# Patient Record
Sex: Female | Born: 1935 | Race: White | Hispanic: No | Marital: Married | Smoking: Former smoker
Health system: Southern US, Community
[De-identification: ages and names within clinical notes are randomized; demographics above are authoritative.]

## PROBLEM LIST (undated history)

## (undated) DIAGNOSIS — F419 Anxiety disorder, unspecified: Secondary | ICD-10-CM

## (undated) DIAGNOSIS — R55 Syncope and collapse: Secondary | ICD-10-CM

## (undated) DIAGNOSIS — Z9289 Personal history of other medical treatment: Secondary | ICD-10-CM

## (undated) DIAGNOSIS — I499 Cardiac arrhythmia, unspecified: Secondary | ICD-10-CM

## (undated) DIAGNOSIS — S52532A Colles' fracture of left radius, initial encounter for closed fracture: Secondary | ICD-10-CM

## (undated) DIAGNOSIS — I10 Essential (primary) hypertension: Secondary | ICD-10-CM

## (undated) DIAGNOSIS — K5790 Diverticulosis of intestine, part unspecified, without perforation or abscess without bleeding: Secondary | ICD-10-CM

## (undated) DIAGNOSIS — I2510 Atherosclerotic heart disease of native coronary artery without angina pectoris: Secondary | ICD-10-CM

## (undated) DIAGNOSIS — I5189 Other ill-defined heart diseases: Secondary | ICD-10-CM

## (undated) DIAGNOSIS — G4700 Insomnia, unspecified: Secondary | ICD-10-CM

## (undated) DIAGNOSIS — K449 Diaphragmatic hernia without obstruction or gangrene: Secondary | ICD-10-CM

## (undated) DIAGNOSIS — Z860100 Personal history of colon polyps, unspecified: Secondary | ICD-10-CM

## (undated) DIAGNOSIS — M545 Pain in thoracic spine: Secondary | ICD-10-CM

## (undated) DIAGNOSIS — K589 Irritable bowel syndrome without diarrhea: Secondary | ICD-10-CM

## (undated) DIAGNOSIS — L299 Pruritus, unspecified: Secondary | ICD-10-CM

## (undated) DIAGNOSIS — R911 Solitary pulmonary nodule: Secondary | ICD-10-CM

## (undated) DIAGNOSIS — J449 Chronic obstructive pulmonary disease, unspecified: Secondary | ICD-10-CM

## (undated) DIAGNOSIS — J90 Pleural effusion, not elsewhere classified: Secondary | ICD-10-CM

## (undated) DIAGNOSIS — I4891 Unspecified atrial fibrillation: Secondary | ICD-10-CM

## (undated) DIAGNOSIS — M546 Pain in thoracic spine: Secondary | ICD-10-CM

## (undated) DIAGNOSIS — E785 Hyperlipidemia, unspecified: Secondary | ICD-10-CM

## (undated) DIAGNOSIS — Z8701 Personal history of pneumonia (recurrent): Secondary | ICD-10-CM

## (undated) DIAGNOSIS — S2249XA Multiple fractures of ribs, unspecified side, initial encounter for closed fracture: Secondary | ICD-10-CM

## (undated) DIAGNOSIS — K222 Esophageal obstruction: Secondary | ICD-10-CM

## (undated) HISTORY — DX: Pain in thoracic spine: M54.50

## (undated) HISTORY — DX: Colles' fracture of left radius, initial encounter for closed fracture: S52.532A

## (undated) HISTORY — PX: APPENDECTOMY: SHX54

## (undated) HISTORY — DX: Irritable bowel syndrome, unspecified: K58.9

## (undated) HISTORY — DX: Solitary pulmonary nodule: R91.1

## (undated) HISTORY — DX: Anxiety disorder, unspecified: F41.9

## (undated) HISTORY — DX: Personal history of colonic polyps: Z86.010

## (undated) HISTORY — DX: Chronic obstructive pulmonary disease, unspecified: J44.9

## (undated) HISTORY — PX: POLYPECTOMY: SHX149

## (undated) HISTORY — DX: Pruritus, unspecified: L29.9

## (undated) HISTORY — DX: Personal history of pneumonia (recurrent): Z87.01

## (undated) HISTORY — DX: Pain in thoracic spine: M54.6

## (undated) HISTORY — DX: Esophageal obstruction: K22.2

## (undated) HISTORY — DX: Pleural effusion, not elsewhere classified: J90

## (undated) HISTORY — DX: Multiple fractures of ribs, unspecified side, initial encounter for closed fracture: S22.49XA

## (undated) HISTORY — DX: Essential (primary) hypertension: I10

## (undated) HISTORY — DX: Personal history of colon polyps, unspecified: Z86.0100

## (undated) HISTORY — PX: TONSILLECTOMY: SUR1361

## (undated) HISTORY — DX: Personal history of other medical treatment: Z92.89

## (undated) HISTORY — DX: Low back pain: M54.5

## (undated) HISTORY — DX: Insomnia, unspecified: G47.00

## (undated) HISTORY — DX: Other ill-defined heart diseases: I51.89

## (undated) HISTORY — PX: CHOLECYSTECTOMY: SHX55

## (undated) HISTORY — DX: Diaphragmatic hernia without obstruction or gangrene: K44.9

## (undated) HISTORY — DX: Diverticulosis of intestine, part unspecified, without perforation or abscess without bleeding: K57.90

## (undated) HISTORY — DX: Hyperlipidemia, unspecified: E78.5

---

## 1998-05-27 ENCOUNTER — Emergency Department (HOSPITAL_COMMUNITY): Admission: EM | Admit: 1998-05-27 | Discharge: 1998-05-27 | Payer: Self-pay | Admitting: Emergency Medicine

## 1998-12-30 ENCOUNTER — Other Ambulatory Visit: Admission: RE | Admit: 1998-12-30 | Discharge: 1998-12-30 | Payer: Self-pay | Admitting: Obstetrics and Gynecology

## 1999-08-25 ENCOUNTER — Other Ambulatory Visit: Admission: RE | Admit: 1999-08-25 | Discharge: 1999-08-25 | Payer: Self-pay | Admitting: Obstetrics and Gynecology

## 1999-12-01 ENCOUNTER — Ambulatory Visit (HOSPITAL_COMMUNITY): Admission: RE | Admit: 1999-12-01 | Discharge: 1999-12-01 | Payer: Self-pay | Admitting: Gastroenterology

## 2000-10-17 ENCOUNTER — Other Ambulatory Visit: Admission: RE | Admit: 2000-10-17 | Discharge: 2000-10-17 | Payer: Self-pay | Admitting: Obstetrics and Gynecology

## 2000-11-01 ENCOUNTER — Ambulatory Visit (HOSPITAL_COMMUNITY): Admission: RE | Admit: 2000-11-01 | Discharge: 2000-11-01 | Payer: Self-pay | Admitting: Internal Medicine

## 2000-11-01 ENCOUNTER — Encounter: Payer: Self-pay | Admitting: Internal Medicine

## 2000-12-20 ENCOUNTER — Inpatient Hospital Stay (HOSPITAL_COMMUNITY): Admission: EM | Admit: 2000-12-20 | Discharge: 2000-12-26 | Payer: Self-pay | Admitting: Emergency Medicine

## 2000-12-20 ENCOUNTER — Encounter: Payer: Self-pay | Admitting: Emergency Medicine

## 2000-12-21 ENCOUNTER — Encounter: Payer: Self-pay | Admitting: Internal Medicine

## 2000-12-22 ENCOUNTER — Encounter: Payer: Self-pay | Admitting: Internal Medicine

## 2000-12-23 ENCOUNTER — Encounter: Payer: Self-pay | Admitting: Internal Medicine

## 2000-12-24 ENCOUNTER — Encounter: Payer: Self-pay | Admitting: Internal Medicine

## 2000-12-25 ENCOUNTER — Encounter: Payer: Self-pay | Admitting: Internal Medicine

## 2000-12-26 ENCOUNTER — Encounter: Payer: Self-pay | Admitting: Internal Medicine

## 2001-01-03 ENCOUNTER — Encounter: Payer: Self-pay | Admitting: Internal Medicine

## 2001-01-03 ENCOUNTER — Inpatient Hospital Stay (HOSPITAL_COMMUNITY): Admission: EM | Admit: 2001-01-03 | Discharge: 2001-01-04 | Payer: Self-pay | Admitting: Internal Medicine

## 2001-01-03 ENCOUNTER — Encounter (INDEPENDENT_AMBULATORY_CARE_PROVIDER_SITE_OTHER): Payer: Self-pay | Admitting: *Deleted

## 2001-02-06 ENCOUNTER — Ambulatory Visit (HOSPITAL_COMMUNITY): Admission: RE | Admit: 2001-02-06 | Discharge: 2001-02-06 | Payer: Self-pay | Admitting: Internal Medicine

## 2001-02-06 ENCOUNTER — Encounter: Payer: Self-pay | Admitting: Internal Medicine

## 2001-11-02 ENCOUNTER — Other Ambulatory Visit: Admission: RE | Admit: 2001-11-02 | Discharge: 2001-11-02 | Payer: Self-pay | Admitting: Obstetrics and Gynecology

## 2001-11-07 ENCOUNTER — Ambulatory Visit (HOSPITAL_COMMUNITY): Admission: RE | Admit: 2001-11-07 | Discharge: 2001-11-07 | Payer: Self-pay | Admitting: Internal Medicine

## 2001-11-07 ENCOUNTER — Encounter: Payer: Self-pay | Admitting: Internal Medicine

## 2001-11-08 ENCOUNTER — Ambulatory Visit (HOSPITAL_COMMUNITY): Admission: RE | Admit: 2001-11-08 | Discharge: 2001-11-08 | Payer: Self-pay | Admitting: Specialist

## 2001-11-08 ENCOUNTER — Encounter: Payer: Self-pay | Admitting: Specialist

## 2002-01-28 ENCOUNTER — Encounter: Admission: RE | Admit: 2002-01-28 | Discharge: 2002-01-28 | Payer: Self-pay | Admitting: Internal Medicine

## 2002-01-28 ENCOUNTER — Encounter: Payer: Self-pay | Admitting: Internal Medicine

## 2002-11-13 ENCOUNTER — Ambulatory Visit (HOSPITAL_COMMUNITY): Admission: RE | Admit: 2002-11-13 | Discharge: 2002-11-13 | Payer: Self-pay | Admitting: Internal Medicine

## 2002-11-13 ENCOUNTER — Encounter: Payer: Self-pay | Admitting: Internal Medicine

## 2002-12-19 ENCOUNTER — Other Ambulatory Visit: Admission: RE | Admit: 2002-12-19 | Discharge: 2002-12-19 | Payer: Self-pay | Admitting: Obstetrics and Gynecology

## 2003-09-20 HISTORY — PX: BLADDER SUSPENSION: SHX72

## 2003-11-18 ENCOUNTER — Ambulatory Visit (HOSPITAL_COMMUNITY): Admission: RE | Admit: 2003-11-18 | Discharge: 2003-11-18 | Payer: Self-pay | Admitting: Internal Medicine

## 2003-12-02 ENCOUNTER — Observation Stay (HOSPITAL_COMMUNITY): Admission: RE | Admit: 2003-12-02 | Discharge: 2003-12-03 | Payer: Self-pay | Admitting: Urology

## 2003-12-31 ENCOUNTER — Encounter: Admission: RE | Admit: 2003-12-31 | Discharge: 2003-12-31 | Payer: Self-pay | Admitting: Orthopedic Surgery

## 2004-02-09 ENCOUNTER — Ambulatory Visit (HOSPITAL_COMMUNITY): Admission: RE | Admit: 2004-02-09 | Discharge: 2004-02-09 | Payer: Self-pay | Admitting: Internal Medicine

## 2004-03-09 ENCOUNTER — Ambulatory Visit (HOSPITAL_COMMUNITY): Admission: AD | Admit: 2004-03-09 | Discharge: 2004-03-09 | Payer: Self-pay | Admitting: Cardiology

## 2004-07-23 ENCOUNTER — Ambulatory Visit: Payer: Self-pay | Admitting: Internal Medicine

## 2004-09-19 HISTORY — PX: ROTATOR CUFF REPAIR: SHX139

## 2004-12-06 ENCOUNTER — Ambulatory Visit (HOSPITAL_COMMUNITY): Admission: RE | Admit: 2004-12-06 | Discharge: 2004-12-06 | Payer: Self-pay | Admitting: Internal Medicine

## 2004-12-08 ENCOUNTER — Ambulatory Visit: Payer: Self-pay | Admitting: Internal Medicine

## 2004-12-10 ENCOUNTER — Ambulatory Visit: Payer: Self-pay | Admitting: Internal Medicine

## 2004-12-21 ENCOUNTER — Ambulatory Visit: Payer: Self-pay | Admitting: Internal Medicine

## 2004-12-24 ENCOUNTER — Ambulatory Visit: Payer: Self-pay | Admitting: Internal Medicine

## 2005-07-15 ENCOUNTER — Ambulatory Visit: Payer: Self-pay | Admitting: Internal Medicine

## 2005-08-01 ENCOUNTER — Ambulatory Visit: Payer: Self-pay | Admitting: Internal Medicine

## 2005-08-09 ENCOUNTER — Ambulatory Visit: Payer: Self-pay | Admitting: Internal Medicine

## 2005-08-25 ENCOUNTER — Ambulatory Visit: Payer: Self-pay | Admitting: Cardiology

## 2005-09-30 ENCOUNTER — Other Ambulatory Visit: Admission: RE | Admit: 2005-09-30 | Discharge: 2005-09-30 | Payer: Self-pay | Admitting: Obstetrics and Gynecology

## 2005-11-02 ENCOUNTER — Ambulatory Visit: Payer: Self-pay | Admitting: Internal Medicine

## 2005-11-18 ENCOUNTER — Encounter: Admission: RE | Admit: 2005-11-18 | Discharge: 2005-11-18 | Payer: Self-pay | Admitting: Internal Medicine

## 2005-12-02 ENCOUNTER — Ambulatory Visit: Payer: Self-pay | Admitting: Internal Medicine

## 2005-12-05 ENCOUNTER — Ambulatory Visit: Payer: Self-pay | Admitting: Internal Medicine

## 2005-12-09 ENCOUNTER — Ambulatory Visit: Payer: Self-pay | Admitting: Internal Medicine

## 2005-12-12 ENCOUNTER — Ambulatory Visit: Payer: Self-pay | Admitting: Internal Medicine

## 2005-12-12 ENCOUNTER — Ambulatory Visit: Payer: Self-pay

## 2006-01-10 ENCOUNTER — Ambulatory Visit: Payer: Self-pay | Admitting: Internal Medicine

## 2006-02-08 ENCOUNTER — Ambulatory Visit: Payer: Self-pay | Admitting: Internal Medicine

## 2006-02-09 ENCOUNTER — Ambulatory Visit: Payer: Self-pay | Admitting: Internal Medicine

## 2006-02-09 LAB — CONVERTED CEMR LAB
Basophils Absolute: 1 10*3/uL — ABNORMAL HIGH (ref 0.0–0.1)
Eosinophils Absolute: 6.1 10*3/uL — ABNORMAL HIGH (ref 0.0–0.6)
HCT: 39.4 % (ref 36.0–46.0)
Hemoglobin: 13.2 g/dL (ref 12.0–15.0)
MCHC: 33.5 g/dL (ref 30.0–36.0)
MCV: 91.8 fL (ref 78.0–100.0)
Monocytes Absolute: 0.4 10*3/uL (ref 0.2–0.7)
Neutrophils Relative %: 50.9 % (ref 43.0–77.0)

## 2006-07-03 ENCOUNTER — Ambulatory Visit: Payer: Self-pay | Admitting: Internal Medicine

## 2006-07-12 ENCOUNTER — Ambulatory Visit (HOSPITAL_COMMUNITY): Admission: RE | Admit: 2006-07-12 | Discharge: 2006-07-13 | Payer: Self-pay | Admitting: Orthopedic Surgery

## 2006-09-14 ENCOUNTER — Ambulatory Visit: Payer: Self-pay | Admitting: Internal Medicine

## 2006-11-06 ENCOUNTER — Ambulatory Visit: Payer: Self-pay | Admitting: Internal Medicine

## 2006-11-06 LAB — CONVERTED CEMR LAB
BUN: 27 mg/dL — ABNORMAL HIGH (ref 6–23)
Basophils Relative: 1.2 % — ABNORMAL HIGH (ref 0.0–1.0)
Monocytes Relative: 4.4 % (ref 3.0–11.0)
Platelets: 282 10*3/uL (ref 150–400)
RBC / HPF: NONE SEEN
RDW: 13.8 % (ref 11.5–14.6)
Specific Gravity, Urine: >=1.03 (ref 1.000–1.03)
Urine Glucose: NEGATIVE mg/dL
Urobilinogen, UA: 0.2 (ref 0.0–1.0)
pH: 6 (ref 5.0–8.0)

## 2006-11-07 ENCOUNTER — Encounter: Payer: Self-pay | Admitting: Internal Medicine

## 2006-11-07 ENCOUNTER — Ambulatory Visit: Payer: Self-pay | Admitting: Cardiology

## 2006-11-10 ENCOUNTER — Ambulatory Visit: Payer: Self-pay | Admitting: Internal Medicine

## 2006-11-10 LAB — CONVERTED CEMR LAB
Bilirubin Urine: NEGATIVE
Ketones, ur: NEGATIVE mg/dL
Nitrite: NEGATIVE
Urine Glucose: NEGATIVE mg/dL

## 2006-11-24 ENCOUNTER — Ambulatory Visit: Payer: Self-pay | Admitting: Gastroenterology

## 2006-11-24 LAB — CONVERTED CEMR LAB
Basophils Absolute: 0 10*3/uL (ref 0.0–0.1)
Eosinophils Absolute: 0.3 10*3/uL (ref 0.0–0.6)
Eosinophils Relative: 4.6 % (ref 0.0–5.0)
Folate: 16 ng/mL
MCHC: 33.5 g/dL (ref 30.0–36.0)
MCV: 87.9 fL (ref 78.0–100.0)
Monocytes Relative: 6.5 % (ref 3.0–11.0)
Neutro Abs: 3.6 10*3/uL (ref 1.4–7.7)
Platelets: 279 10*3/uL (ref 150–400)
RBC: 4.98 M/uL (ref 3.87–5.11)
Sed Rate: 7 mm/hr (ref 0–25)
Vitamin B-12: 375 pg/mL (ref 211–911)
WBC: 6.5 10*3/uL (ref 4.5–10.5)

## 2006-11-27 ENCOUNTER — Ambulatory Visit: Payer: Self-pay | Admitting: Gastroenterology

## 2006-11-27 ENCOUNTER — Encounter (INDEPENDENT_AMBULATORY_CARE_PROVIDER_SITE_OTHER): Payer: Self-pay | Admitting: Specialist

## 2006-12-01 ENCOUNTER — Ambulatory Visit (HOSPITAL_COMMUNITY): Admission: RE | Admit: 2006-12-01 | Discharge: 2006-12-01 | Payer: Self-pay | Admitting: Internal Medicine

## 2006-12-06 ENCOUNTER — Inpatient Hospital Stay (HOSPITAL_COMMUNITY): Admission: EM | Admit: 2006-12-06 | Discharge: 2006-12-07 | Payer: Self-pay | Admitting: Emergency Medicine

## 2006-12-06 ENCOUNTER — Encounter: Payer: Self-pay | Admitting: Gastroenterology

## 2006-12-07 ENCOUNTER — Encounter (INDEPENDENT_AMBULATORY_CARE_PROVIDER_SITE_OTHER): Payer: Self-pay | Admitting: *Deleted

## 2006-12-12 ENCOUNTER — Ambulatory Visit: Payer: Self-pay | Admitting: Gastroenterology

## 2006-12-13 ENCOUNTER — Ambulatory Visit: Payer: Self-pay | Admitting: Internal Medicine

## 2006-12-21 ENCOUNTER — Emergency Department (HOSPITAL_COMMUNITY): Admission: EM | Admit: 2006-12-21 | Discharge: 2006-12-21 | Payer: Self-pay | Admitting: Emergency Medicine

## 2007-01-17 ENCOUNTER — Other Ambulatory Visit: Admission: RE | Admit: 2007-01-17 | Discharge: 2007-01-17 | Payer: Self-pay | Admitting: Obstetrics and Gynecology

## 2007-02-01 ENCOUNTER — Ambulatory Visit: Payer: Self-pay

## 2007-06-12 ENCOUNTER — Encounter: Payer: Self-pay | Admitting: *Deleted

## 2007-06-12 DIAGNOSIS — D126 Benign neoplasm of colon, unspecified: Secondary | ICD-10-CM

## 2007-06-12 DIAGNOSIS — I10 Essential (primary) hypertension: Secondary | ICD-10-CM | POA: Insufficient documentation

## 2007-06-12 DIAGNOSIS — Z9089 Acquired absence of other organs: Secondary | ICD-10-CM

## 2007-06-12 DIAGNOSIS — Z8701 Personal history of pneumonia (recurrent): Secondary | ICD-10-CM | POA: Insufficient documentation

## 2007-07-24 ENCOUNTER — Encounter: Payer: Self-pay | Admitting: Internal Medicine

## 2007-08-02 ENCOUNTER — Ambulatory Visit: Payer: Self-pay | Admitting: Internal Medicine

## 2007-08-02 DIAGNOSIS — J309 Allergic rhinitis, unspecified: Secondary | ICD-10-CM

## 2007-08-02 DIAGNOSIS — K589 Irritable bowel syndrome without diarrhea: Secondary | ICD-10-CM | POA: Insufficient documentation

## 2007-08-02 DIAGNOSIS — G4700 Insomnia, unspecified: Secondary | ICD-10-CM

## 2007-09-08 ENCOUNTER — Telehealth: Payer: Self-pay | Admitting: Internal Medicine

## 2007-09-10 ENCOUNTER — Telehealth: Payer: Self-pay | Admitting: Internal Medicine

## 2007-09-14 ENCOUNTER — Ambulatory Visit: Payer: Self-pay | Admitting: Internal Medicine

## 2007-09-14 DIAGNOSIS — J4520 Mild intermittent asthma, uncomplicated: Secondary | ICD-10-CM

## 2007-09-17 ENCOUNTER — Telehealth (INDEPENDENT_AMBULATORY_CARE_PROVIDER_SITE_OTHER): Payer: Self-pay | Admitting: *Deleted

## 2007-09-25 ENCOUNTER — Ambulatory Visit: Payer: Self-pay | Admitting: Internal Medicine

## 2007-09-27 ENCOUNTER — Encounter: Payer: Self-pay | Admitting: Internal Medicine

## 2007-10-01 ENCOUNTER — Ambulatory Visit: Payer: Self-pay | Admitting: Internal Medicine

## 2007-10-01 ENCOUNTER — Telehealth: Payer: Self-pay | Admitting: Internal Medicine

## 2007-10-01 DIAGNOSIS — J984 Other disorders of lung: Secondary | ICD-10-CM

## 2007-10-05 ENCOUNTER — Ambulatory Visit: Payer: Self-pay | Admitting: Cardiology

## 2007-10-08 ENCOUNTER — Ambulatory Visit: Payer: Self-pay | Admitting: Internal Medicine

## 2007-10-31 ENCOUNTER — Ambulatory Visit: Payer: Self-pay | Admitting: Internal Medicine

## 2007-10-31 DIAGNOSIS — R0609 Other forms of dyspnea: Secondary | ICD-10-CM

## 2007-10-31 DIAGNOSIS — R0989 Other specified symptoms and signs involving the circulatory and respiratory systems: Secondary | ICD-10-CM

## 2007-11-15 ENCOUNTER — Encounter: Payer: Self-pay | Admitting: Internal Medicine

## 2007-11-21 ENCOUNTER — Encounter (INDEPENDENT_AMBULATORY_CARE_PROVIDER_SITE_OTHER): Payer: Self-pay | Admitting: *Deleted

## 2007-11-21 ENCOUNTER — Ambulatory Visit: Payer: Self-pay | Admitting: Internal Medicine

## 2007-11-21 DIAGNOSIS — I519 Heart disease, unspecified: Secondary | ICD-10-CM | POA: Insufficient documentation

## 2007-12-05 ENCOUNTER — Encounter: Payer: Self-pay | Admitting: Internal Medicine

## 2007-12-05 ENCOUNTER — Ambulatory Visit: Payer: Self-pay | Admitting: Internal Medicine

## 2007-12-10 ENCOUNTER — Ambulatory Visit (HOSPITAL_COMMUNITY): Admission: RE | Admit: 2007-12-10 | Discharge: 2007-12-10 | Payer: Self-pay | Admitting: Internal Medicine

## 2007-12-11 ENCOUNTER — Encounter: Payer: Self-pay | Admitting: Internal Medicine

## 2007-12-14 ENCOUNTER — Telehealth: Payer: Self-pay | Admitting: Internal Medicine

## 2007-12-19 ENCOUNTER — Ambulatory Visit: Payer: Self-pay | Admitting: Internal Medicine

## 2007-12-19 ENCOUNTER — Ambulatory Visit (HOSPITAL_COMMUNITY): Admission: RE | Admit: 2007-12-19 | Discharge: 2007-12-19 | Payer: Self-pay | Admitting: Internal Medicine

## 2007-12-24 ENCOUNTER — Telehealth: Payer: Self-pay | Admitting: Internal Medicine

## 2007-12-25 ENCOUNTER — Encounter: Payer: Self-pay | Admitting: Internal Medicine

## 2007-12-26 ENCOUNTER — Ambulatory Visit (HOSPITAL_COMMUNITY): Admission: RE | Admit: 2007-12-26 | Discharge: 2007-12-26 | Payer: Self-pay | Admitting: Internal Medicine

## 2007-12-31 ENCOUNTER — Ambulatory Visit: Payer: Self-pay | Admitting: Internal Medicine

## 2008-01-02 ENCOUNTER — Telehealth: Payer: Self-pay | Admitting: Internal Medicine

## 2008-01-16 ENCOUNTER — Ambulatory Visit: Payer: Self-pay

## 2008-01-16 ENCOUNTER — Encounter: Payer: Self-pay | Admitting: Internal Medicine

## 2008-01-22 ENCOUNTER — Encounter: Payer: Self-pay | Admitting: Internal Medicine

## 2008-01-23 ENCOUNTER — Telehealth: Payer: Self-pay | Admitting: Internal Medicine

## 2008-01-24 ENCOUNTER — Encounter: Admission: RE | Admit: 2008-01-24 | Discharge: 2008-01-24 | Payer: Self-pay | Admitting: Internal Medicine

## 2008-01-24 ENCOUNTER — Ambulatory Visit: Payer: Self-pay | Admitting: Internal Medicine

## 2008-01-24 LAB — CONVERTED CEMR LAB
Basophils Absolute: 0.1 10*3/uL (ref 0.0–0.1)
Basophils Relative: 0.9 % (ref 0.0–1.0)
CO2: 28 meq/L (ref 19–32)
Chloride: 103 meq/L (ref 96–112)
Eosinophils Absolute: 0.3 10*3/uL (ref 0.0–0.7)
GFR calc non Af Amer: 86 mL/min
Lymphocytes Relative: 33.9 % (ref 12.0–46.0)
MCHC: 33.5 g/dL (ref 30.0–36.0)
MCV: 88.6 fL (ref 78.0–100.0)
Neutrophils Relative %: 54.6 % (ref 43.0–77.0)
Platelets: 256 10*3/uL (ref 150–400)
Potassium: 4.1 meq/L (ref 3.5–5.1)
RBC: 4.79 M/uL (ref 3.87–5.11)
WBC: 7.1 10*3/uL (ref 4.5–10.5)

## 2008-01-25 ENCOUNTER — Ambulatory Visit: Payer: Self-pay | Admitting: Internal Medicine

## 2008-01-30 ENCOUNTER — Ambulatory Visit: Payer: Self-pay | Admitting: Internal Medicine

## 2008-01-31 ENCOUNTER — Telehealth: Payer: Self-pay | Admitting: Internal Medicine

## 2008-04-15 ENCOUNTER — Telehealth: Payer: Self-pay | Admitting: Internal Medicine

## 2008-05-19 ENCOUNTER — Telehealth: Payer: Self-pay | Admitting: Internal Medicine

## 2008-05-28 ENCOUNTER — Ambulatory Visit: Payer: Self-pay | Admitting: Internal Medicine

## 2008-05-28 DIAGNOSIS — F418 Other specified anxiety disorders: Secondary | ICD-10-CM

## 2008-06-11 ENCOUNTER — Telehealth: Payer: Self-pay | Admitting: Internal Medicine

## 2008-06-11 ENCOUNTER — Ambulatory Visit: Payer: Self-pay | Admitting: Internal Medicine

## 2008-06-23 ENCOUNTER — Telehealth: Payer: Self-pay | Admitting: Internal Medicine

## 2008-06-24 ENCOUNTER — Ambulatory Visit: Payer: Self-pay | Admitting: Internal Medicine

## 2008-06-26 ENCOUNTER — Telehealth: Payer: Self-pay | Admitting: Internal Medicine

## 2008-07-18 ENCOUNTER — Telehealth: Payer: Self-pay | Admitting: Internal Medicine

## 2008-07-21 ENCOUNTER — Ambulatory Visit: Payer: Self-pay | Admitting: Internal Medicine

## 2008-09-16 ENCOUNTER — Ambulatory Visit: Payer: Self-pay | Admitting: Pulmonary Disease

## 2008-09-16 ENCOUNTER — Telehealth: Payer: Self-pay | Admitting: Internal Medicine

## 2008-09-19 HISTORY — PX: ORIF WRIST FRACTURE: SHX2133

## 2008-09-30 ENCOUNTER — Encounter: Payer: Self-pay | Admitting: Internal Medicine

## 2008-10-06 ENCOUNTER — Telehealth: Payer: Self-pay | Admitting: Internal Medicine

## 2008-10-15 ENCOUNTER — Ambulatory Visit: Payer: Self-pay | Admitting: Cardiology

## 2008-10-20 DIAGNOSIS — S2249XA Multiple fractures of ribs, unspecified side, initial encounter for closed fracture: Secondary | ICD-10-CM

## 2008-10-20 HISTORY — DX: Multiple fractures of ribs, unspecified side, initial encounter for closed fracture: S22.49XA

## 2008-10-21 ENCOUNTER — Ambulatory Visit: Payer: Self-pay | Admitting: Internal Medicine

## 2008-10-21 DIAGNOSIS — J449 Chronic obstructive pulmonary disease, unspecified: Secondary | ICD-10-CM

## 2008-10-22 ENCOUNTER — Telehealth (INDEPENDENT_AMBULATORY_CARE_PROVIDER_SITE_OTHER): Payer: Self-pay | Admitting: *Deleted

## 2008-11-03 ENCOUNTER — Telehealth: Payer: Self-pay | Admitting: Internal Medicine

## 2008-11-10 ENCOUNTER — Encounter: Payer: Self-pay | Admitting: Internal Medicine

## 2008-11-11 ENCOUNTER — Encounter: Payer: Self-pay | Admitting: Internal Medicine

## 2008-11-13 ENCOUNTER — Encounter: Payer: Self-pay | Admitting: Internal Medicine

## 2008-11-13 DIAGNOSIS — S52539A Colles' fracture of unspecified radius, initial encounter for closed fracture: Secondary | ICD-10-CM | POA: Insufficient documentation

## 2008-11-14 ENCOUNTER — Encounter: Payer: Self-pay | Admitting: Internal Medicine

## 2008-11-15 ENCOUNTER — Telehealth: Payer: Self-pay | Admitting: Family Medicine

## 2008-11-19 ENCOUNTER — Telehealth: Payer: Self-pay | Admitting: Internal Medicine

## 2008-11-19 ENCOUNTER — Ambulatory Visit: Payer: Self-pay | Admitting: Internal Medicine

## 2008-11-20 ENCOUNTER — Ambulatory Visit: Payer: Self-pay | Admitting: Internal Medicine

## 2008-11-20 ENCOUNTER — Encounter: Admission: RE | Admit: 2008-11-20 | Discharge: 2008-11-20 | Payer: Self-pay | Admitting: Internal Medicine

## 2008-11-24 ENCOUNTER — Telehealth: Payer: Self-pay | Admitting: Internal Medicine

## 2008-11-26 ENCOUNTER — Telehealth: Payer: Self-pay | Admitting: Internal Medicine

## 2008-12-08 ENCOUNTER — Telehealth: Payer: Self-pay | Admitting: Internal Medicine

## 2008-12-09 ENCOUNTER — Encounter: Payer: Self-pay | Admitting: Internal Medicine

## 2008-12-10 ENCOUNTER — Telehealth: Payer: Self-pay | Admitting: Internal Medicine

## 2008-12-10 ENCOUNTER — Ambulatory Visit: Payer: Self-pay | Admitting: Internal Medicine

## 2008-12-17 ENCOUNTER — Telehealth: Payer: Self-pay | Admitting: Internal Medicine

## 2008-12-23 ENCOUNTER — Telehealth: Payer: Self-pay | Admitting: Internal Medicine

## 2008-12-24 ENCOUNTER — Encounter: Admission: RE | Admit: 2008-12-24 | Discharge: 2008-12-24 | Payer: Self-pay | Admitting: Internal Medicine

## 2008-12-24 ENCOUNTER — Ambulatory Visit: Payer: Self-pay | Admitting: Internal Medicine

## 2008-12-24 LAB — CONVERTED CEMR LAB
BUN: 17 mg/dL (ref 6–23)
Basophils Relative: 0.3 % (ref 0.0–3.0)
CO2: 28 meq/L (ref 19–32)
Calcium: 9.2 mg/dL (ref 8.4–10.5)
Eosinophils Relative: 2.4 % (ref 0.0–5.0)
GFR calc non Af Amer: 125.93 mL/min (ref >60)
Glucose, Bld: 94 mg/dL (ref 70–99)
HCT: 38.9 % (ref 36.0–46.0)
Hemoglobin: 13.3 g/dL (ref 12.0–15.0)
Lymphs Abs: 2 10*3/uL (ref 0.7–4.0)
MCV: 89.7 fL (ref 78.0–100.0)
Monocytes Absolute: 0.2 10*3/uL (ref 0.1–1.0)
Monocytes Relative: 3.1 % (ref 3.0–12.0)
Neutro Abs: 5.1 10*3/uL (ref 1.4–7.7)
RBC: 4.34 M/uL (ref 3.87–5.11)
Sodium: 143 meq/L (ref 135–145)
WBC: 7.5 10*3/uL (ref 4.5–10.5)

## 2009-01-01 ENCOUNTER — Telehealth: Payer: Self-pay | Admitting: Internal Medicine

## 2009-01-06 ENCOUNTER — Ambulatory Visit: Payer: Self-pay | Admitting: Internal Medicine

## 2009-01-06 ENCOUNTER — Telehealth: Payer: Self-pay | Admitting: Internal Medicine

## 2009-01-06 DIAGNOSIS — S62109A Fracture of unspecified carpal bone, unspecified wrist, initial encounter for closed fracture: Secondary | ICD-10-CM | POA: Insufficient documentation

## 2009-01-26 ENCOUNTER — Telehealth (INDEPENDENT_AMBULATORY_CARE_PROVIDER_SITE_OTHER): Payer: Self-pay | Admitting: *Deleted

## 2009-01-28 ENCOUNTER — Ambulatory Visit: Payer: Self-pay | Admitting: Internal Medicine

## 2009-03-18 ENCOUNTER — Telehealth (INDEPENDENT_AMBULATORY_CARE_PROVIDER_SITE_OTHER): Payer: Self-pay | Admitting: *Deleted

## 2009-03-30 ENCOUNTER — Ambulatory Visit: Payer: Self-pay | Admitting: Internal Medicine

## 2009-03-31 ENCOUNTER — Telehealth: Payer: Self-pay | Admitting: Internal Medicine

## 2009-04-10 ENCOUNTER — Encounter: Payer: Self-pay | Admitting: Internal Medicine

## 2009-04-25 ENCOUNTER — Encounter: Payer: Self-pay | Admitting: Internal Medicine

## 2009-04-27 ENCOUNTER — Encounter: Payer: Self-pay | Admitting: Internal Medicine

## 2009-05-01 ENCOUNTER — Encounter: Payer: Self-pay | Admitting: Internal Medicine

## 2009-06-23 ENCOUNTER — Ambulatory Visit: Payer: Self-pay | Admitting: Internal Medicine

## 2009-06-24 DIAGNOSIS — H9190 Unspecified hearing loss, unspecified ear: Secondary | ICD-10-CM | POA: Insufficient documentation

## 2009-08-03 ENCOUNTER — Telehealth: Payer: Self-pay | Admitting: Gastroenterology

## 2009-08-10 ENCOUNTER — Ambulatory Visit: Payer: Self-pay | Admitting: Internal Medicine

## 2009-08-10 DIAGNOSIS — M810 Age-related osteoporosis without current pathological fracture: Secondary | ICD-10-CM

## 2009-08-10 DIAGNOSIS — R159 Full incontinence of feces: Secondary | ICD-10-CM | POA: Insufficient documentation

## 2009-08-10 DIAGNOSIS — Z860100 Personal history of colon polyps, unspecified: Secondary | ICD-10-CM | POA: Insufficient documentation

## 2009-08-10 DIAGNOSIS — K449 Diaphragmatic hernia without obstruction or gangrene: Secondary | ICD-10-CM | POA: Insufficient documentation

## 2009-08-10 DIAGNOSIS — Z8719 Personal history of other diseases of the digestive system: Secondary | ICD-10-CM | POA: Insufficient documentation

## 2009-08-25 ENCOUNTER — Telehealth: Payer: Self-pay | Admitting: Internal Medicine

## 2009-10-20 ENCOUNTER — Ambulatory Visit: Payer: Self-pay | Admitting: Internal Medicine

## 2009-11-10 ENCOUNTER — Encounter (INDEPENDENT_AMBULATORY_CARE_PROVIDER_SITE_OTHER): Payer: Self-pay | Admitting: *Deleted

## 2009-11-11 ENCOUNTER — Ambulatory Visit: Payer: Self-pay | Admitting: Internal Medicine

## 2009-11-30 ENCOUNTER — Ambulatory Visit (HOSPITAL_COMMUNITY): Admission: RE | Admit: 2009-11-30 | Discharge: 2009-11-30 | Payer: Self-pay | Admitting: Internal Medicine

## 2009-11-30 LAB — HM MAMMOGRAPHY: HM Mammogram: NEGATIVE

## 2009-12-09 ENCOUNTER — Encounter: Payer: Self-pay | Admitting: Internal Medicine

## 2009-12-09 DIAGNOSIS — I6529 Occlusion and stenosis of unspecified carotid artery: Secondary | ICD-10-CM | POA: Insufficient documentation

## 2009-12-10 ENCOUNTER — Ambulatory Visit: Payer: Self-pay

## 2009-12-10 ENCOUNTER — Encounter: Payer: Self-pay | Admitting: Internal Medicine

## 2009-12-15 ENCOUNTER — Ambulatory Visit: Payer: Self-pay | Admitting: Internal Medicine

## 2009-12-16 DIAGNOSIS — N39498 Other specified urinary incontinence: Secondary | ICD-10-CM | POA: Insufficient documentation

## 2009-12-25 ENCOUNTER — Ambulatory Visit: Payer: Self-pay | Admitting: Internal Medicine

## 2010-01-15 ENCOUNTER — Ambulatory Visit: Payer: Self-pay | Admitting: Internal Medicine

## 2010-01-22 ENCOUNTER — Ambulatory Visit: Payer: Self-pay | Admitting: Internal Medicine

## 2010-02-11 ENCOUNTER — Ambulatory Visit: Payer: Self-pay | Admitting: Internal Medicine

## 2010-02-13 ENCOUNTER — Encounter: Payer: Self-pay | Admitting: Internal Medicine

## 2010-02-16 ENCOUNTER — Telehealth: Payer: Self-pay | Admitting: Internal Medicine

## 2010-02-16 ENCOUNTER — Ambulatory Visit: Payer: Self-pay | Admitting: Internal Medicine

## 2010-02-16 DIAGNOSIS — R279 Unspecified lack of coordination: Secondary | ICD-10-CM | POA: Insufficient documentation

## 2010-02-16 DIAGNOSIS — I4891 Unspecified atrial fibrillation: Secondary | ICD-10-CM

## 2010-02-17 ENCOUNTER — Ambulatory Visit (HOSPITAL_COMMUNITY)
Admission: RE | Admit: 2010-02-17 | Discharge: 2010-02-17 | Payer: Self-pay | Source: Home / Self Care | Admitting: Internal Medicine

## 2010-02-18 ENCOUNTER — Ambulatory Visit: Payer: Self-pay | Admitting: Internal Medicine

## 2010-05-03 ENCOUNTER — Ambulatory Visit: Payer: Self-pay | Admitting: Internal Medicine

## 2010-05-03 DIAGNOSIS — M25569 Pain in unspecified knee: Secondary | ICD-10-CM

## 2010-05-12 ENCOUNTER — Telehealth (INDEPENDENT_AMBULATORY_CARE_PROVIDER_SITE_OTHER): Payer: Self-pay | Admitting: *Deleted

## 2010-07-21 ENCOUNTER — Ambulatory Visit: Payer: Self-pay | Admitting: Internal Medicine

## 2010-07-21 LAB — CONVERTED CEMR LAB
AST: 18 units/L (ref 0–37)
Albumin: 4 g/dL (ref 3.5–5.2)
Alkaline Phosphatase: 85 units/L (ref 39–117)
Basophils Relative: 0.6 % (ref 0.0–3.0)
Bilirubin Urine: NEGATIVE
CO2: 29 meq/L (ref 19–32)
Calcium: 9.2 mg/dL (ref 8.4–10.5)
GFR calc non Af Amer: 87.75 mL/min (ref >60)
HCT: 44.1 % (ref 36.0–46.0)
Hemoglobin: 15 g/dL (ref 12.0–15.0)
Ketones, ur: NEGATIVE mg/dL
Lymphocytes Relative: 19.7 % (ref 12.0–46.0)
Lymphs Abs: 1.7 10*3/uL (ref 0.7–4.0)
MCHC: 34 g/dL (ref 30.0–36.0)
Monocytes Relative: 4.4 % (ref 3.0–12.0)
Neutro Abs: 6.2 10*3/uL (ref 1.4–7.7)
Potassium: 4 meq/L (ref 3.5–5.1)
RBC: 5 M/uL (ref 3.87–5.11)
Sodium: 141 meq/L (ref 135–145)
Total CHOL/HDL Ratio: 3
Total Protein, Urine: 30 mg/dL
Total Protein: 6.2 g/dL (ref 6.0–8.3)
Triglycerides: 100 mg/dL (ref 0.0–149.0)
Urine Glucose: NEGATIVE mg/dL

## 2010-07-23 ENCOUNTER — Encounter: Payer: Self-pay | Admitting: Internal Medicine

## 2010-07-23 ENCOUNTER — Ambulatory Visit: Payer: Self-pay | Admitting: Internal Medicine

## 2010-08-02 ENCOUNTER — Telehealth: Payer: Self-pay | Admitting: Internal Medicine

## 2010-08-03 ENCOUNTER — Ambulatory Visit: Payer: Self-pay | Admitting: Gastroenterology

## 2010-08-03 DIAGNOSIS — K602 Anal fissure, unspecified: Secondary | ICD-10-CM

## 2010-08-19 HISTORY — PX: TOTAL KNEE ARTHROPLASTY: SHX125

## 2010-08-23 ENCOUNTER — Telehealth: Payer: Self-pay | Admitting: Internal Medicine

## 2010-08-24 ENCOUNTER — Ambulatory Visit: Payer: Self-pay | Admitting: Internal Medicine

## 2010-08-24 DIAGNOSIS — N390 Urinary tract infection, site not specified: Secondary | ICD-10-CM

## 2010-08-24 LAB — CONVERTED CEMR LAB
Bilirubin Urine: NEGATIVE
Leukocytes, UA: NEGATIVE
Nitrite: NEGATIVE
Specific Gravity, Urine: <=1.005 (ref 1.000–1.030)
pH: 6.5 (ref 5.0–8.0)

## 2010-08-26 ENCOUNTER — Telehealth: Payer: Self-pay | Admitting: Internal Medicine

## 2010-09-17 ENCOUNTER — Ambulatory Visit
Admission: RE | Admit: 2010-09-17 | Discharge: 2010-09-17 | Payer: Self-pay | Source: Home / Self Care | Attending: Internal Medicine | Admitting: Internal Medicine

## 2010-09-30 ENCOUNTER — Telehealth: Payer: Self-pay | Admitting: Internal Medicine

## 2010-10-01 ENCOUNTER — Inpatient Hospital Stay (HOSPITAL_COMMUNITY)
Admission: RE | Admit: 2010-10-01 | Discharge: 2010-10-05 | Payer: Self-pay | Source: Home / Self Care | Attending: Orthopedic Surgery | Admitting: Orthopedic Surgery

## 2010-10-04 LAB — PROTIME-INR
INR: 0.99 (ref 0.00–1.49)
INR: 1.17 (ref 0.00–1.49)
INR: 2.06 — ABNORMAL HIGH (ref 0.00–1.49)
Prothrombin Time: 13.3 seconds (ref 11.6–15.2)
Prothrombin Time: 15.1 seconds (ref 11.6–15.2)
Prothrombin Time: 23.4 seconds — ABNORMAL HIGH (ref 11.6–15.2)

## 2010-10-04 LAB — BASIC METABOLIC PANEL
BUN: 24 mg/dL — ABNORMAL HIGH (ref 6–23)
BUN: 14 mg/dL (ref 6–23)
CO2: 26 mEq/L (ref 19–32)
CO2: 25 mEq/L (ref 19–32)
Calcium: 7.8 mg/dL — ABNORMAL LOW (ref 8.4–10.5)
Calcium: 8.1 mg/dL — ABNORMAL LOW (ref 8.4–10.5)
Chloride: 101 mEq/L (ref 96–112)
Chloride: 107 mEq/L (ref 96–112)
Creatinine, Ser: 1.08 mg/dL (ref 0.4–1.2)
Creatinine, Ser: 0.73 mg/dL (ref 0.4–1.2)
GFR calc Af Amer: 59 mL/min — ABNORMAL LOW (ref >60)
GFR calc Af Amer: >60 mL/min (ref >60)
GFR calc non Af Amer: 48 mL/min — ABNORMAL LOW (ref >60)
GFR calc non Af Amer: >60 mL/min (ref >60)
Glucose, Bld: 148 mg/dL — ABNORMAL HIGH (ref 70–99)
Glucose, Bld: 145 mg/dL — ABNORMAL HIGH (ref 70–99)
Potassium: 4 mEq/L (ref 3.5–5.1)
Potassium: 4.1 mEq/L (ref 3.5–5.1)
Sodium: 134 mEq/L — ABNORMAL LOW (ref 135–145)
Sodium: 137 mEq/L (ref 135–145)

## 2010-10-04 LAB — URINALYSIS, ROUTINE W REFLEX MICROSCOPIC
Bilirubin Urine: NEGATIVE
Hgb urine dipstick: NEGATIVE
Ketones, ur: NEGATIVE mg/dL
Nitrite: NEGATIVE
Protein, ur: NEGATIVE mg/dL
Specific Gravity, Urine: 1.023 (ref 1.005–1.030)
Urine Glucose, Fasting: NEGATIVE mg/dL
Urobilinogen, UA: 0.2 mg/dL (ref 0.0–1.0)
pH: 6 (ref 5.0–8.0)

## 2010-10-04 LAB — URINE MICROSCOPIC-ADD ON

## 2010-10-04 LAB — CBC
HCT: 43.4 % (ref 36.0–46.0)
HCT: 26.7 % — ABNORMAL LOW (ref 36.0–46.0)
HCT: 22.1 % — ABNORMAL LOW (ref 36.0–46.0)
Hemoglobin: 14.6 g/dL (ref 12.0–15.0)
Hemoglobin: 8.7 g/dL — ABNORMAL LOW (ref 12.0–15.0)
Hemoglobin: 7.2 g/dL — ABNORMAL LOW (ref 12.0–15.0)
MCH: 29.6 pg (ref 26.0–34.0)
MCH: 29 pg (ref 26.0–34.0)
MCH: 29 pg (ref 26.0–34.0)
MCHC: 33.6 g/dL (ref 30.0–36.0)
MCHC: 32.6 g/dL (ref 30.0–36.0)
MCHC: 32.6 g/dL (ref 30.0–36.0)
MCV: 88 fL (ref 78.0–100.0)
MCV: 89 fL (ref 78.0–100.0)
MCV: 89.1 fL (ref 78.0–100.0)
Platelets: 212 10*3/uL (ref 150–400)
Platelets: 157 10*3/uL (ref 150–400)
Platelets: 123 10*3/uL — ABNORMAL LOW (ref 150–400)
RBC: 4.93 MIL/uL (ref 3.87–5.11)
RBC: 3 MIL/uL — ABNORMAL LOW (ref 3.87–5.11)
RBC: 2.48 MIL/uL — ABNORMAL LOW (ref 3.87–5.11)
RDW: 13.7 % (ref 11.5–15.5)
RDW: 14 % (ref 11.5–15.5)
RDW: 14.2 % (ref 11.5–15.5)
WBC: 6.2 10*3/uL (ref 4.0–10.5)
WBC: 11.7 10*3/uL — ABNORMAL HIGH (ref 4.0–10.5)
WBC: 13.5 10*3/uL — ABNORMAL HIGH (ref 4.0–10.5)

## 2010-10-04 LAB — COMPREHENSIVE METABOLIC PANEL
ALT: 21 U/L (ref 0–35)
AST: 20 U/L (ref 0–37)
Albumin: 4 g/dL (ref 3.5–5.2)
Alkaline Phosphatase: 81 U/L (ref 39–117)
BUN: 21 mg/dL (ref 6–23)
CO2: 28 mEq/L (ref 19–32)
Calcium: 9.2 mg/dL (ref 8.4–10.5)
Chloride: 105 mEq/L (ref 96–112)
Creatinine, Ser: 0.62 mg/dL (ref 0.4–1.2)
GFR calc Af Amer: >60 mL/min (ref >60)
GFR calc non Af Amer: >60 mL/min (ref >60)
Glucose, Bld: 89 mg/dL (ref 70–99)
Potassium: 4.7 mEq/L (ref 3.5–5.1)
Sodium: 138 mEq/L (ref 135–145)
Total Bilirubin: 0.7 mg/dL (ref 0.3–1.2)
Total Protein: 6.8 g/dL (ref 6.0–8.3)

## 2010-10-04 LAB — SURGICAL PCR SCREEN
MRSA, PCR: NEGATIVE
Staphylococcus aureus: NEGATIVE

## 2010-10-04 LAB — APTT: aPTT: 29 seconds (ref 24–37)

## 2010-10-04 LAB — PREPARE RBC (CROSSMATCH)

## 2010-10-06 LAB — TYPE AND SCREEN
ABO/RH(D): O POS
Antibody Screen: NEGATIVE
Unit division: 0
Unit division: 0

## 2010-10-06 LAB — CBC
HCT: 29.3 % — ABNORMAL LOW (ref 36.0–46.0)
Hemoglobin: 9.7 g/dL — ABNORMAL LOW (ref 12.0–15.0)
MCH: 29 pg (ref 26.0–34.0)
MCHC: 33.1 g/dL (ref 30.0–36.0)
MCV: 87.7 fL (ref 78.0–100.0)
Platelets: 113 10*3/uL — ABNORMAL LOW (ref 150–400)
RBC: 3.34 MIL/uL — ABNORMAL LOW (ref 3.87–5.11)
RDW: 14.6 % (ref 11.5–15.5)
WBC: 12.5 10*3/uL — ABNORMAL HIGH (ref 4.0–10.5)

## 2010-10-06 LAB — PROTIME-INR
INR: 2.27 — ABNORMAL HIGH (ref 0.00–1.49)
INR: 2.14 — ABNORMAL HIGH (ref 0.00–1.49)
Prothrombin Time: 25.2 seconds — ABNORMAL HIGH (ref 11.6–15.2)
Prothrombin Time: 24.1 seconds — ABNORMAL HIGH (ref 11.6–15.2)

## 2010-10-06 LAB — BASIC METABOLIC PANEL
BUN: 14 mg/dL (ref 6–23)
CO2: 28 mEq/L (ref 19–32)
Calcium: 8.3 mg/dL — ABNORMAL LOW (ref 8.4–10.5)
Chloride: 105 mEq/L (ref 96–112)
Creatinine, Ser: 0.68 mg/dL (ref 0.4–1.2)
GFR calc Af Amer: >60 mL/min (ref >60)
GFR calc non Af Amer: >60 mL/min (ref >60)
Glucose, Bld: 97 mg/dL (ref 70–99)
Potassium: 4.2 mEq/L (ref 3.5–5.1)
Sodium: 138 mEq/L (ref 135–145)

## 2010-10-06 LAB — HEMOGLOBIN A1C
Hgb A1c MFr Bld: 5.7 % — ABNORMAL HIGH (ref <5.7)
Mean Plasma Glucose: 117 mg/dL — ABNORMAL HIGH (ref <117)

## 2010-10-08 ENCOUNTER — Telehealth: Payer: Self-pay | Admitting: Internal Medicine

## 2010-10-09 ENCOUNTER — Encounter: Payer: Self-pay | Admitting: Internal Medicine

## 2010-10-10 ENCOUNTER — Encounter: Payer: Self-pay | Admitting: Internal Medicine

## 2010-10-17 LAB — CONVERTED CEMR LAB
Basophils Absolute: 0 10*3/uL (ref 0.0–0.1)
Calcium: 9.7 mg/dL (ref 8.4–10.5)
Chloride: 106 meq/L (ref 96–112)
Eosinophils Absolute: 0.2 10*3/uL (ref 0.0–0.6)
GFR calc Af Amer: 104 mL/min
GFR calc non Af Amer: 86 mL/min
Glucose, Bld: 84 mg/dL (ref 70–99)
Lymphocytes Relative: 32 % (ref 12.0–46.0)
MCHC: 33.8 g/dL (ref 30.0–36.0)
MCV: 89.8 fL (ref 78.0–100.0)
Neutro Abs: 3.1 10*3/uL (ref 1.4–7.7)
Platelets: 262 10*3/uL (ref 150–400)
Pro B Natriuretic peptide (BNP): 18 pg/mL (ref 0.0–100.0)
RBC: 4.78 M/uL (ref 3.87–5.11)
Sodium: 141 meq/L (ref 135–145)

## 2010-10-19 NOTE — Letter (Signed)
Summary: BP Readings/[facility]  BP Readings/[facility]   Imported By: Sherian Rein 03/03/2010 09:57:57  _____________________________________________________________________  External Attachment:    Type:   Image     Comment:   External Document
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
## 2010-10-19 NOTE — Assessment & Plan Note (Signed)
Summary: rec ov before COL...as.    History of Present Illness Visit Type: Follow-up Visit Primary GI MD: Lina Sar MD Primary Provider: Illene Regulus, MD Chief Complaint: Discuss colonoscopy History of Present Illness:   67 year 0ld  white female with severe irritable bowel syndrome with predominant diarrhea, bile salt overflow diarrhea after cholecystectomy and a laxed  rectal sphincter tone evaluated  at [facility] in 1990s. She had a  post polypectomy bleed requiring hospitalization. in 2008 after a removal of cecal polyp. The polyp was adenomatous. Sheiwill be due for recall colonoscopy in  March 2013  at age 68 and questions the necessity of recall colonoscopy given the prior complications.. She has been greatly improved on WelChol 625 milligram 2 tablets daily and Lomotil p.r.n. She has not had any accidents of fecal  incontinence   GI Review of Systems    Reports bloating.      Denies abdominal pain, acid reflux, belching, chest pain, dysphagia with liquids, dysphagia with solids, heartburn, loss of appetite, nausea, vomiting, vomiting blood, weight loss, and  weight gain.      Reports diarrhea.     Denies anal fissure, black tarry stools, change in bowel habit, constipation, diverticulosis, fecal incontinence, heme positive stool, hemorrhoids, irritable bowel syndrome, jaundice, light color stool, liver problems, rectal bleeding, and  rectal pain.    Current Medications (verified): 1)  Diovan Hct 80-12.5 Mg  Tabs (Valsartan-Hydrochlorothiazide) .... Take One Tablet Daily 2)  Multivitamins   Tabs (Multiple Vitamin) .Marland Kitchen.. 1 By Mouth Once Daily 3)  Calcium-D 600-200 Mg-Unit Tabs (Calcium Carbonate-Vitamin D) .... Take 3 Tablet By Mouth Once A Day 4)  Aleve 220 Mg Tabs (Naproxen Sodium) .Marland Kitchen.. 1 Two Times A Day As Needed 5)  Zolpidem Tartrate 10 Mg Tabs (Zolpidem Tartrate) .Marland Kitchen.. 1 By Mouth At Bedtime 6)  Welchol 625 Mg Tabs (Colesevelam Hcl) .... Take 2 Tablets By Mouth Every Morning 7)   Lomotil 2.5-0.025 Mg Tabs (Diphenoxylate-Atropine) .... Take 1 Tablet By Mouth At Bedtime 8)  Voltaren 1 % Gel (Diclofenac Sodium) .... Apply 4 G To Affected Knee 4 Times A Day 9)  Lexapro 10 Mg Tabs (Escitalopram Oxalate) .Marland Kitchen.. 1 By Mouth Q Am  Allergies (verified): 1)  ! Penicillin 2)  ! Sulfa 3)  Amoxicillin (Amoxicillin) 4)  Sulfamethoxazole (Sulfamethoxazole)  Past History:  Past Medical History: Reviewed history from 12/10/2008 and no changes required. PLEURAL EFFUSION, LEFT (ICD-511.9) COLLES' FRACTURE, LEFT (ICD-813.41) COPD (ICD-496) ANXIETY STATE, UNSPECIFIED (ICD-300.00) BACK PAIN, THORACIC REGION, RIGHT (ICD-724.1) CHEST PAIN UNSPECIFIED (ICD-786.50) PRURITUS (ICD-698.9) DIASTOLIC DYSFUNCTION (ICD-429.9) DYSPNEA ON EXERTION (ICD-786.09) PULMONARY NODULE (ICD-518.89) ASTHMA (ICD-493.90) UPPER RESPIRATORY INFECTION (ICD-465) IRRITABLE BOWEL SYNDROME (ICD-564.1) CRAMP OF LIMB (ICD-729.82) ALLERGIC RHINITIS, CHRONIC (ICD-477.9) BRONCHIECTASIS (ICD-494.0) INSOMNIA UNSPECIFIED (ICD-780.52) HX, PNEUMONIA (ICD-V12.61) DIVERTICULOSIS,  HX OF (ICD-V12.79) Hx of POLYP, COLON (ICD-211.3) HYPERTENSION (ICD-401.9) #DYSPNEA - since Jan 2009 folowing 'pneumonia' v 20 yr history..................................Marland KitchenRamaswamy -> Suspect due to COPD (See below) with BD response: ON symbicort since 12/2007. Refused spiriva. -> hx of remote smoking 12 pack year. Quit 1960. ? under-reporting history -> Diastolic dysfunction with LVH 4/25 2 D echo but normal BNP and D-dimer 4.8.2009 -> Low Prob VQ scan 12/19/2007 -> Dextro convex scoliois on Bone Scan 01/05/2008 -> PFT 12/05/2007: obstructive spirometry FEv1 1.05L/59% with 11% BD response and low dlco, restricted lung volume due to scoliosis -> Spiro 10/21/2008: Fev1 0.92L/50%, FVC 1.6L/60%, Ratio 59 (78) -> CT Chest 10/05/2007 -> 10/16/2007: hyperinflation and stable micronodules  -> Hgb 14.2gm% on 01/24/2008 #  PULMONARY  MICRONODULES.......................................................Marland KitchenRamaswamy -> Stable on ct chest 10/05/2007 to 10/15/2008 -> No further followup Multiple Rib fractures-bilaterally 2/10  Physician Roster:      Gyn - Jarold Motto      GI- Marina Goodell ( will not see Jarold Motto)      Card - Brackbill      Ortho- Gioffre      Pulm - M Ramaswamy      Hand Surgery - [facility]  Past Surgical History: Reviewed history from 12/10/2008 and no changes required. TONSILLECTOMY, HX OF (ICD-V45.79) APPENDECTOMY, HX OF (ICD-V45.79) CHOLECYSTECTOMY, HX OF (ICD-V45.79) POLYPECTOMY, HX OF (ICD-V15.9) A-P bladder suspension '05 Rotator cuff repair'06  ORIF left wrist '10 Mina Marble)  Family History: Reviewed history from 08/10/2009 and no changes required. No FH of Colon Cancer: Family History of Heart Disease: Mother  Social History: Reviewed history from 08/10/2009 and no changes required. College - [address] [place]. [place] married 1959 3 daughters, 5 grandsons, 4 granddaughters. Remains very active, but can't play tennis Patient was apparently an ex-smoker, however, husband does not recollect patient smoking. Stressful move to smaller quarters (Fall '09)  Review of Systems       The patient complains of allergy/sinus, hearing problems, shortness of breath, sleeping problems, and urine leakage.  The patient denies anemia, anxiety-new, arthritis/joint pain, back pain, blood in urine, breast changes/lumps, change in vision, confusion, cough, coughing up blood, depression-new, fainting, fatigue, fever, headaches-new, heart murmur, heart rhythm changes, itching, menstrual pain, muscle pains/cramps, night sweats, nosebleeds, pregnancy symptoms, skin rash, sore throat, swelling of feet/legs, swollen lymph glands, thirst - excessive , urination - excessive , urination changes/pain, vision changes, and voice change.         Pertinent positive and negative review of systems were noted in the above HPI. All  other ROS was otherwise negative.   Vital Signs:  Patient profile:   77 year old female Height:      65 inches Weight:      141 pounds BMI:     23.55 Pulse rate:   56 / minute Pulse rhythm:   irregular BP sitting:   102 / 68  (left arm) Cuff size:   regular  Vitals Entered By: June McMurray CMA Duncan Dull) (December 25, 2009 10:33 AM)   Impression & Recommendations:  Problem # 1:  INCONTINENCE, FECAL (ICD-787.6) dramatically improved on a combination of WelChol and Lomotil which patient has been able to reduce to a prn dose.Marland Kitchen She will continue the same regimen.  Problem # 2:  COLONIC POLYPS, ADENOMATOUS, HX OF (ICD-V12.72) adenomatous polyp of the cecum in 2008. Post polypectomy bleed. Patient is hesitant to have  another colonoscopy  in the setting of her age  and prior complication. II  agree with her not to have the recall colonoscopy unless  she has  specific symptoms that would warrant  another exam.  Patient Instructions: 1)  no further recall colonoscopy  2)  Patient to call us if she develops specific of symptoms such as rectal bleeding 3)  Refills on WelChol 625 mg 2 p.o. q.d. 4)  Reason Lomotil one p.o. q.h.s. 5)  Copy sent to : Dr Debby Bud Prescriptions: WELCHOL 625 MG TABS (COLESEVELAM HCL) Take 2 tablets by mouth every morning  #60 x 11   Entered by:   Christie Nottingham CMA (AAMA)   Authorized by:   Hart Carwin MD   Signed by:   Christie Nottingham CMA (AAMA) on 12/25/2009   Method used:   Printed then  faxed to ...       [facility] (retail)       [address]       [place], [place]  [address]       Ph: [phone] or [phone]       Fax: [phone]   RxID:   [id number] LOMOTIL 2.5-0.025 MG TABS (DIPHENOXYLATE-ATROPINE) Take 1 tablet by mouth at bedtime  #30 x 11   Entered by:   Christie Nottingham CMA (AAMA)   Authorized by:   Hart Carwin MD   Signed by:   Christie Nottingham CMA (AAMA) on 12/25/2009   Method used:   Printed then faxed to ...        [facility] (retail)       [address]       [place], [place]  [id number]       Ph: [phone] or [phone]       Fax: [phone]   RxID:   [id number]

## 2010-10-19 NOTE — Assessment & Plan Note (Signed)
Summary: ?bladder infection/#/cd   Vital Signs:  Patient profile:   77 year old female Height:      64 inches Weight:      140 pounds BMI:     24.12 O2 Sat:      98 % on Room air Temp:     97.6 degrees F oral Pulse rate:   81 / minute Resp:     12 per minute BP sitting:   130 / 64  O2 Flow:  Room air  Primary Care Provider:  Illene Regulus, MD   History of Present Illness: patient presents for symptoms of a bladder infection: having urinary frequency and discomfort. She is taking macrodantin 50mg  for suppression. she did triple up on her macrodantin. She has a h/o recurrent infections. No fever, rigors, dysuria, flank pain or frank hematuria.  Allergies: 1)  ! Penicillin 2)  ! Sulfa 3)  Amoxicillin (Amoxicillin)  Past History:  Past Medical History: Last updated: 12/10/2008 PLEURAL EFFUSION, LEFT (ICD-511.9) COLLES' FRACTURE, LEFT (ICD-813.41) COPD (ICD-496) ANXIETY STATE, UNSPECIFIED (ICD-300.00) BACK PAIN, THORACIC REGION, RIGHT (ICD-724.1) CHEST PAIN UNSPECIFIED (ICD-786.50) PRURITUS (ICD-698.9) DIASTOLIC DYSFUNCTION (ICD-429.9) DYSPNEA ON EXERTION (ICD-786.09) PULMONARY NODULE (ICD-518.89) ASTHMA (ICD-493.90) UPPER RESPIRATORY INFECTION (ICD-465) IRRITABLE BOWEL SYNDROME (ICD-564.1) CRAMP OF LIMB (ICD-729.82) ALLERGIC RHINITIS, CHRONIC (ICD-477.9) BRONCHIECTASIS (ICD-494.0) INSOMNIA UNSPECIFIED (ICD-780.52) HX, PNEUMONIA (ICD-V12.61) DIVERTICULOSIS,  HX OF (ICD-V12.79) Hx of POLYP, COLON (ICD-211.3) HYPERTENSION (ICD-401.9) #DYSPNEA - since Jan 2009 folowing 'pneumonia' v 20 yr history..................................Marland KitchenRamaswamy -> Suspect due to COPD (See below) with BD response: ON symbicort since 12/2007. Refused spiriva. -> hx of remote smoking 12 pack year. Quit 1960. ? under-reporting history -> Diastolic dysfunction with LVH 6/96 2 D echo but normal BNP and D-dimer 4.8.2009 -> Low Prob VQ scan 12/19/2007 -> Dextro convex scoliois on Bone Scan  01/05/2008 -> PFT 12/05/2007: obstructive spirometry FEv1 1.05L/59% with 11% BD response and low dlco, restricted lung volume due to scoliosis -> Spiro 10/21/2008: Fev1 0.92L/50%, FVC 1.6L/60%, Ratio 59 (78) -> CT Chest 10/05/2007 -> 10/16/2007: hyperinflation and stable micronodules  -> Hgb 14.2gm% on 01/24/2008 #PULMONARY MICRONODULES.......................................................Marland KitchenRamaswamy -> Stable on ct chest 10/05/2007 to 10/15/2008 -> No further followup Multiple Rib fractures-bilaterally 2/10  Physician Roster:      [facility] - Jarold Motto      GI- Marina Goodell ( will not see Jarold Motto)      Card - Brackbill      Ortho- Gioffre      Pulm - M Ramaswamy      Hand Surgery - Mina Marble  Past Surgical History: Last updated: 12/10/2008 TONSILLECTOMY, HX OF (ICD-V45.79) APPENDECTOMY, HX OF (ICD-V45.79) CHOLECYSTECTOMY, HX OF (ICD-V45.79) POLYPECTOMY, HX OF (ICD-V15.9) A-P bladder suspension '05 Rotator cuff repair'06  ORIF left wrist '10 Mina Marble) FH reviewed for relevance, SH/Risk Factors reviewed for relevance  Review of Systems  The patient denies anorexia, fever, weight loss, decreased hearing, chest pain, dyspnea on exertion, abdominal pain, muscle weakness, difficulty walking, abnormal bleeding, and enlarged lymph nodes.    Physical Exam  General:  Well-developed,well-nourished,in no acute distress; alert,appropriate and cooperative throughout examination Head:  normocephalic and atraumatic.   Eyes:  pupils equal and pupils round.  C&S clear Lungs:  normal respiratory effort and normal breath sounds.   Heart:  normal rate and regular rhythm.   Neurologic:  alert & oriented X3, cranial nerves II-XII intact, and gait normal.   Skin:  turgor normal and color normal.   Cervical Nodes:  no anterior cervical adenopathy.   Psych:  Oriented X3, normally  interactive, and good eye contact.     Impression & Recommendations:  Problem # 1:  UTI (ICD-599.0) Recurrent UTI. Discussed with her the  need for U/A with cultures to be sure of infection and sensitivities of organisms. Also cautioned against using increased doses of macrodantin due to nephrotoxicity and her age.   Plkan - cipro 250 mg two times a day x 7 1 refill             to lab for U/A and culture.  Her updated medication list for this problem includes:    Nitrofurantoin Macrocrystal 50 Mg Caps (Nitrofurantoin macrocrystal) .Marland Kitchen... 1 by mouth once daily    Cipro 250 Mg Tabs (Ciprofloxacin hcl) .Marland Kitchen... 1 by mouth two times a day x 7  Orders: TLB-Udip w/ Micro (81001-URINE) T-Urine Culture (Spectrum Order) [id number])  Complete Medication List: 1)  Diovan Hct 160-12.5 Mg Tabs (Valsartan-hydrochlorothiazide) .Marland Kitchen.. 1 by mouth once daily 2)  Multivitamins Tabs (Multiple vitamin) .Marland Kitchen.. 1 by mouth once daily 3)  Calcium-d 600-200 Mg-unit Tabs (Calcium carbonate-vitamin d) .... Take 3 tablet by mouth once a day 4)  Aleve 220 Mg Tabs (Naproxen sodium) .Marland Kitchen.. 1 two times a day as needed 5)  Zolpidem Tartrate 10 Mg Tabs (Zolpidem tartrate) .Marland Kitchen.. 1 by mouth at bedtime 6)  Welchol 625 Mg Tabs (Colesevelam hcl) .... Take 2 tablets by mouth every morning 7)  Voltaren 1 % Gel (Diclofenac sodium) .... Apply 4 g to affected knee 4 times a day 8)  Hydroxyzine Hcl 10 Mg Tabs (Hydroxyzine hcl) .Marland Kitchen.. 1 tab at bedtime as needed 9)  Lumigan 0.01 % Soln (Bimatoprost) .Marland Kitchen.. 1 drop both eyes daily 10)  Nitrofurantoin Macrocrystal 50 Mg Caps (Nitrofurantoin macrocrystal) .Marland Kitchen.. 1 by mouth once daily 11)  Symbicort 80-4.5 Mcg/act Aero (Budesonide-formoterol fumarate) .... 2 inhalations two times a day 12)  Analpram-hc 1-2.5 % Crea (Hydrocortisone ace-pramoxine) .... Apply to rectum three times a day as needed rectal bleeding 13)  Cipro 250 Mg Tabs (Ciprofloxacin hcl) .Marland Kitchen.. 1 by mouth two times a day x 7 Prescriptions: CIPRO 250 MG TABS (CIPROFLOXACIN HCL) 1 by mouth two times a day x 7  #14 x 1   Entered and Authorized by:   [name] MD   Signed  by:   Lamar Sprinkles, CMA on 08/24/2010   Method used:   Electronically to        [facility] (retail)       [address]       [place], [place]  [id number]       Ph: [phone] or [phone]       Fax: [phone]   RxID:   [id number]    Orders Added: 1)  TLB-Udip w/ Micro [81001-URINE] 2)  T-Urine Culture (Spectrum Order) [[id number]] 3)  Est. Patient Level III [26948]   Immunization History:  Pneumovax Immunization History:    Pneumovax:  historical (11/18/1992)   Immunization History:  Pneumovax Immunization History:    Pneumovax:  Historical (11/18/1992)   Appended Document: ?bladder infection/#/cd     Clinical Lists Changes  Orders: Added new Service order of Pneumococcal Vaccine (54627) - Signed Added new Service order of Admin 1st Vaccine (03500) - Signed Observations: Added new observation of PNEUMOVAXVIS: 08/24/09 version given August 25, 2010. (08/25/2010 10:42) Added new observation of PNEUMOVAXLOT: 1297AA (08/25/2010 10:42) Added new observation of PNEUMOVAXEXP: 01/14/2012 (08/25/2010 10:42) Added new observation of PNEUMOVAXBY: Ami Bullins CMA (08/25/2010 10:42) Added new observation of PNEUMOVAXRTE: IM (08/25/2010 10:42)  Added new observation of PNEUMOVAXDOS: 0.5 ml (08/25/2010 10:42) Added new observation of PNEUMOVAXMFR: Merck (08/25/2010 10:42) Added new observation of PNEUMOVAXSIT: left deltoid (08/25/2010 10:42) Added new observation of PNEUMOVAX: Pneumovax (08/25/2010 10:42)       Immunizations Administered:  Pneumonia Vaccine:    Vaccine Type: Pneumovax    Site: left deltoid    Mfr: Merck    Dose: 0.5 ml    Route: IM    Given by: Ami Bullins CMA    Exp. Date: 01/14/2012    Lot #: [id number]    VIS given: 08/24/09 version given August 25, 2010.
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
## 2010-10-19 NOTE — Progress Notes (Signed)
Summary: Call Report/MEN pt  Phone Note Other Incoming   Caller: [facility] Summary of Call: [facility] Triage Call Report Triage Record Num: [id number] Operator: Remonia Richter Patient Name: [facility] Call Date & Time: 08/21/2010 6:56:58PM Patient Phone: [phone] PCP: Illene Regulus Patient Gender: Female PCP Fax : [phone] Patient DOB: 03/16/1925 Practice Name: [facility] Reason for Call: has chronic UTI and on Macrodantin, requesting Cipro for urinary discomfort and frequency, afebrile, all emergent s/s ruled out,home care advise given per UTI guideline, callback perimeters gone over, advised to use Urgent Care in am per see in 24 hr disposition, told not able to call in antibiotics for UTI per request Protocol(s) Used: Urinary Symptoms - Female Recommended Outcome per Protocol: See Provider within 24 hours Reason for Outcome: Has one or more urinary tract symptoms Care Advice:  ~ Tell provider medical history of renal disease; especially if have only one kidney.  ~ Call provider if you develop flank or low back pain, fever, generally feel sick. Increase intake of fluids. Try to drink 8 oz. (.2 liter) every hour when awake, including unsweetened cranberry juice, unless on restricted fluids for other medical reasons. Take sips of fluid or eat ice chips if nauseated or vomiting.  ~  ~ Tell your provider if you are taking Warfarin (Coumadin) and drinking cranberry juice or taking cranberry capsules.  ~ SYMPTOM / CONDITION MANAGEMENT  ~ CAUTIONS Limit carbonated, alcoholic, and caffeinated beverages such as coffee, tea and soda. Avoid nonprescription cold and allergy medications that contain caffeine. Limit intake of tomatoes, fruit juices (except for unsweetened cranberry juice), dairy products, spicy foods, sugar, and artificial sweeteners (aspartame or saccharine). Stop or decrease smoking. Reducing exposure to bladder irritants may help lessen urgency.   ~ Analgesic/Antipyretic Advice - Acetaminophen: Consider acetaminophen as directed on label or by pharmacist/provider for pain or fever PRECAUTIONS: - Use if there i Initial call taken by: Margaret Pyle, CMA,  August 23, 2010 8:53 AM

## 2010-10-19 NOTE — Assessment & Plan Note (Signed)
Summary: per ami sched ---meds out  stc   Vital Signs:  Patient profile:   77 year old female Height:      65 inches Weight:      138 pounds BMI:     23.05 O2 Sat:      97 % on Room air Temp:     96.9 degrees F oral Pulse rate:   83 / minute BP sitting:   138 / 80  (left arm) Cuff size:   regular  Vitals Entered By: Bill Salinas CMA (Feb 11, 2010 9:30 AM)  O2 Flow:  Room air CC: pt here for follow up, she finished regimen of Doxycyline/ ab   Primary Care Provider:  Illene Regulus, MD  CC:  pt here for follow up and she finished regimen of Doxycyline/ ab.  History of Present Illness: Mrs. Lori [name] presents with a question about the safety of continuing to take atarax for itching and sleep. She has had a persistent rash left UE. she had a biopsy recently and was told she had a precancerous condition. The atarax is effective in relieving her itching. She takes this at night and has no adverse effects; i.e. no daytime drowsiness or clouding of mentation.   Current Medications (verified): 1)  Diovan Hct 80-12.5 Mg  Tabs (Valsartan-Hydrochlorothiazide) .... Take One Tablet Daily 2)  Multivitamins   Tabs (Multiple Vitamin) .Marland Kitchen.. 1 By Mouth Once Daily 3)  Calcium-D 600-200 Mg-Unit Tabs (Calcium Carbonate-Vitamin D) .... Take 3 Tablet By Mouth Once A Day 4)  Aleve 220 Mg Tabs (Naproxen Sodium) .Marland Kitchen.. 1 Two Times A Day As Needed 5)  Zolpidem Tartrate 10 Mg Tabs (Zolpidem Tartrate) .Marland Kitchen.. 1 By Mouth At Bedtime 6)  Welchol 625 Mg Tabs (Colesevelam Hcl) .... Take 2 Tablets By Mouth Every Morning 7)  Lomotil 2.5-0.025 Mg Tabs (Diphenoxylate-Atropine) .... Take 1 Tablet By Mouth At Bedtime 8)  Voltaren 1 % Gel (Diclofenac Sodium) .... Apply 4 G To Affected Knee 4 Times A Day 9)  Lexapro 10 Mg Tabs (Escitalopram Oxalate) .Marland Kitchen.. 1 By Mouth Q Am 10)  Doxycycline Hyclate 100 Mg Caps (Doxycycline Hyclate) .... Two Times A Day X 7 11)  Hydroxyzine Hcl 10 Mg Tabs (Hydroxyzine Hcl) .Marland Kitchen.. 1 Tab At Bedtime As  Needed  Allergies (verified): 1)  ! Penicillin 2)  ! Sulfa 3)  Amoxicillin (Amoxicillin) 4)  Sulfamethoxazole (Sulfamethoxazole) PMH-FH-SH reviewed-no changes except otherwise noted  Physical Exam  General:  Well-developed,well-nourished,in no acute distress; alert,appropriate and cooperative throughout examination Head:  Normocephalic and atraumatic without obvious abnormalities. No apparent alopecia or balding. Eyes:  corneas and lenses clear and no injection.   Lungs:  normal respiratory effort and normal breath sounds.   Heart:  normal rate and regular rhythm.   Msk:  normal ROM.   Neurologic:  alert & oriented X3, cranial nerves II-XII intact, and gait normal.   Skin:  maqcular rash left UE with a healing biopsy site just below the elbow. Also - epidermal hemorrhage from capillary fragility. No suspicious lesions.  Psych:  Oriented X3, memory intact for recent and remote, and normally interactive.     Impression & Recommendations:  Problem # 1:  SKIN RASH (ICD-782.1) OK to take atarax with safety.  Complete Medication List: 1)  Diovan Hct 80-12.5 Mg Tabs (Valsartan-hydrochlorothiazide) .... Take one tablet daily 2)  Multivitamins Tabs (Multiple vitamin) .Marland Kitchen.. 1 by mouth once daily 3)  Calcium-d 600-200 Mg-unit Tabs (Calcium carbonate-vitamin d) .... Take 3 tablet by mouth once a  day 4)  Aleve 220 Mg Tabs (Naproxen sodium) .Marland Kitchen.. 1 two times a day as needed 5)  Zolpidem Tartrate 10 Mg Tabs (Zolpidem tartrate) .Marland Kitchen.. 1 by mouth at bedtime 6)  Welchol 625 Mg Tabs (Colesevelam hcl) .... Take 2 tablets by mouth every morning 7)  Lomotil 2.5-0.025 Mg Tabs (Diphenoxylate-atropine) .... Take 1 tablet by mouth at bedtime 8)  Voltaren 1 % Gel (Diclofenac sodium) .... Apply 4 g to affected knee 4 times a day 9)  Lexapro 10 Mg Tabs (Escitalopram oxalate) .Marland Kitchen.. 1 by mouth q am 10)  Doxycycline Hyclate 100 Mg Caps (Doxycycline hyclate) .... Two times a day x 7 11)  Hydroxyzine Hcl 10 Mg Tabs  (Hydroxyzine hcl) .Marland Kitchen.. 1 tab at bedtime as needed

## 2010-10-19 NOTE — Assessment & Plan Note (Signed)
Summary: rash arm,?sunpoisoning/cd   Vital Signs:  Patient profile:   77 year old female Height:      65 inches Weight:      138 pounds BMI:     23.05 O2 Sat:      96 % on Room air Temp:     97.7 degrees F oral Pulse rate:   72 / minute BP sitting:   146 / 80  (left arm) Cuff size:   regular  Vitals Entered By: Bill Salinas CMA (January 15, 2010 3:18 PM)  O2 Flow:  Room air CC: pt here with complaint of rash on her left arm she has been to dermotologist several times was given Cutivate 0.05% that did not work/ ab   Primary Care Provider:  Illene Regulus, MD  CC:  pt here with complaint of rash on her left arm she has been to dermotologist several times was given Cutivate 0.05% that did not work/ ab.  History of Present Illness: Lori [name] presents for evaluation of an erythematous macular rash on the proximal left UE to the olecranon fossa. She reports that the rash is pruritic but not painful. There are areas of skin erosion especially at the olecranon fossa. She has seen Dr. Leta Speller 3 times for this rash and has been tried on two topical steroid creams without improvement. She does have an appointment with Amy [place] for a second opinion.  Current Medications (verified): 1)  Diovan Hct 80-12.5 Mg  Tabs (Valsartan-Hydrochlorothiazide) .... Take One Tablet Daily 2)  Multivitamins   Tabs (Multiple Vitamin) .Marland Kitchen.. 1 By Mouth Once Daily 3)  Calcium-D 600-200 Mg-Unit Tabs (Calcium Carbonate-Vitamin D) .... Take 3 Tablet By Mouth Once A Day 4)  Aleve 220 Mg Tabs (Naproxen Sodium) .Marland Kitchen.. 1 Two Times A Day As Needed 5)  Zolpidem Tartrate 10 Mg Tabs (Zolpidem Tartrate) .Marland Kitchen.. 1 By Mouth At Bedtime 6)  Welchol 625 Mg Tabs (Colesevelam Hcl) .... Take 2 Tablets By Mouth Every Morning 7)  Lomotil 2.5-0.025 Mg Tabs (Diphenoxylate-Atropine) .... Take 1 Tablet By Mouth At Bedtime 8)  Voltaren 1 % Gel (Diclofenac Sodium) .... Apply 4 G To Affected Knee 4 Times A Day 9)  Lexapro 10 Mg Tabs  (Escitalopram Oxalate) .Marland Kitchen.. 1 By Mouth Q Am  Allergies (verified): 1)  ! Penicillin 2)  ! Sulfa 3)  Amoxicillin (Amoxicillin) 4)  Sulfamethoxazole (Sulfamethoxazole) PMH-FH-SH reviewed-no changes except otherwise noted  Review of Systems       The patient complains of suspicious skin lesions.  The patient denies anorexia, fever, weight loss, hoarseness, peripheral edema, depression, and enlarged lymph nodes.    Physical Exam  General:  WNWD well groomed white female in no distress who looks like she is feeling better than at her last visit. Skin:  erythematous macular rash proximal left UE to the olecranon fossa and just below to the proximal forearm. Several areas appear excoriated, although she denies scratching these areas.    Impression & Recommendations:  Problem # 1:  SKIN RASH (ICD-782.1) I do not know the type or cause of her rash.  Plan - advised that she use a moisturizing lotion or cream           continue with prescribed topical steroid           keep appointment with dermatologist - Dr. Sharyn Blitz - for second opinion.  Complete Medication List: 1)  Diovan Hct 80-12.5 Mg Tabs (Valsartan-hydrochlorothiazide) .... Take one tablet daily 2)  Multivitamins Tabs (Multiple vitamin) .Marland KitchenMarland KitchenMarland Kitchen  1 by mouth once daily 3)  Calcium-d 600-200 Mg-unit Tabs (Calcium carbonate-vitamin d) .... Take 3 tablet by mouth once a day 4)  Aleve 220 Mg Tabs (Naproxen sodium) .Marland Kitchen.. 1 two times a day as needed 5)  Zolpidem Tartrate 10 Mg Tabs (Zolpidem tartrate) .Marland Kitchen.. 1 by mouth at bedtime 6)  Welchol 625 Mg Tabs (Colesevelam hcl) .... Take 2 tablets by mouth every morning 7)  Lomotil 2.5-0.025 Mg Tabs (Diphenoxylate-atropine) .... Take 1 tablet by mouth at bedtime 8)  Voltaren 1 % Gel (Diclofenac sodium) .... Apply 4 g to affected knee 4 times a day 9)  Lexapro 10 Mg Tabs (Escitalopram oxalate) .Marland Kitchen.. 1 by mouth q am
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
## 2010-10-19 NOTE — Progress Notes (Signed)
Summary: referral info  Phone Note Call from Patient Call back at Home Phone [phone]   Summary of Call: Patient left message on triage that she needs info about referral due to her knee pain. Please call her at home. Initial call taken by: Lucious Groves CMA,  May 12, 2010 2:50 PM  Follow-up for Phone Call        [facility] states they will take a msg for dr Despina Hick to see if pt can be seen with him , and pt saw dr Clare Gandy , they will call the pt to inform if she can see dr Reynaldo Minium  May 05, 2010 10:23 AM  Dr. Ollen Gross I called pt to inform of this Shelbie Proctor  May 13, 2010 8:25 AM

## 2010-10-19 NOTE — Assessment & Plan Note (Signed)
Summary: not feeling well and can't hear--stc   Vital Signs:  Patient profile:   77 year old female Height:      65 inches Weight:      140 pounds BMI:     23.38 O2 Sat:      94 % on Room air Temp:     97.7 degrees F oral Pulse rate:   76 / minute BP sitting:   138 / 84  (left arm) Cuff size:   regular  Vitals Entered By: Bill Salinas CMA (Jan 22, 2010 4:38 PM)  O2 Flow:  Room air CC: pt here with complaint of coughing and congestion/ ab   Primary Care Provider:  Illene Regulus, MD  CC:  pt here with complaint of coughing and congestion/ ab.  History of Present Illness: Patient reports cough and chest congestion for a week.  She has felt weak and tired as well.  Her husband was sick last week.  She has begun producing yellow sputum with her cough.  She denies sore throat, sinus congestion, fever, chills, change in appetite, shortness of breath. She does endorse chest tightness and some wheezing. she has not used her inhalers today.   Current Medications (verified): 1)  Diovan Hct 80-12.5 Mg  Tabs (Valsartan-Hydrochlorothiazide) .... Take One Tablet Daily 2)  Multivitamins   Tabs (Multiple Vitamin) .Marland Kitchen.. 1 By Mouth Once Daily 3)  Calcium-D 600-200 Mg-Unit Tabs (Calcium Carbonate-Vitamin D) .... Take 3 Tablet By Mouth Once A Day 4)  Aleve 220 Mg Tabs (Naproxen Sodium) .Marland Kitchen.. 1 Two Times A Day As Needed 5)  Zolpidem Tartrate 10 Mg Tabs (Zolpidem Tartrate) .Marland Kitchen.. 1 By Mouth At Bedtime 6)  Welchol 625 Mg Tabs (Colesevelam Hcl) .... Take 2 Tablets By Mouth Every Morning 7)  Lomotil 2.5-0.025 Mg Tabs (Diphenoxylate-Atropine) .... Take 1 Tablet By Mouth At Bedtime 8)  Voltaren 1 % Gel (Diclofenac Sodium) .... Apply 4 G To Affected Knee 4 Times A Day 9)  Lexapro 10 Mg Tabs (Escitalopram Oxalate) .Marland Kitchen.. 1 By Mouth Q Am  Allergies (verified): 1)  ! Penicillin 2)  ! Sulfa 3)  Amoxicillin (Amoxicillin) 4)  Sulfamethoxazole (Sulfamethoxazole)  Past History:  Past Medical History: Last  updated: 12/10/2008 PLEURAL EFFUSION, LEFT (ICD-511.9) COLLES' FRACTURE, LEFT (ICD-813.41) COPD (ICD-496) ANXIETY STATE, UNSPECIFIED (ICD-300.00) BACK PAIN, THORACIC REGION, RIGHT (ICD-724.1) CHEST PAIN UNSPECIFIED (ICD-786.50) PRURITUS (ICD-698.9) DIASTOLIC DYSFUNCTION (ICD-429.9) DYSPNEA ON EXERTION (ICD-786.09) PULMONARY NODULE (ICD-518.89) ASTHMA (ICD-493.90) UPPER RESPIRATORY INFECTION (ICD-465) IRRITABLE BOWEL SYNDROME (ICD-564.1) CRAMP OF LIMB (ICD-729.82) ALLERGIC RHINITIS, CHRONIC (ICD-477.9) BRONCHIECTASIS (ICD-494.0) INSOMNIA UNSPECIFIED (ICD-780.52) HX, PNEUMONIA (ICD-V12.61) DIVERTICULOSIS,  HX OF (ICD-V12.79) Hx of POLYP, COLON (ICD-211.3) HYPERTENSION (ICD-401.9) #DYSPNEA - since Jan 2009 folowing 'pneumonia' v 20 yr history..................................Marland KitchenRamaswamy -> Suspect due to COPD (See below) with BD response: ON symbicort since 12/2007. Refused spiriva. -> hx of remote smoking 12 pack year. Quit 1960. ? under-reporting history -> Diastolic dysfunction with LVH 0/98 2 D echo but normal BNP and D-dimer 4.8.2009 -> Low Prob VQ scan 12/19/2007 -> Dextro convex scoliois on Bone Scan 01/05/2008 -> PFT 12/05/2007: obstructive spirometry FEv1 1.05L/59% with 11% BD response and low dlco, restricted lung volume due to scoliosis -> Spiro 10/21/2008: Fev1 0.92L/50%, FVC 1.6L/60%, Ratio 59 (78) -> CT Chest 10/05/2007 -> 10/16/2007: hyperinflation and stable micronodules  -> Hgb 14.2gm% on 01/24/2008 #PULMONARY MICRONODULES.......................................................Marland KitchenRamaswamy -> Stable on ct chest 10/05/2007 to 10/15/2008 -> No further followup Multiple Rib fractures-bilaterally 2/10  Physician Roster:      Clayton Bibles Jarold Motto  GI- Marina Goodell ( will not see Jarold Motto)      Card - Brackbill      Ortho- Gioffre      Pulm - M Ramaswamy      Hand Surgery - [facility]  Past Surgical History: Last updated: 12/10/2008 TONSILLECTOMY, HX OF (ICD-V45.79) APPENDECTOMY, HX OF  (ICD-V45.79) CHOLECYSTECTOMY, HX OF (ICD-V45.79) POLYPECTOMY, HX OF (ICD-V15.9) A-P bladder suspension '05 Rotator cuff repair'06  ORIF left wrist '10 [facility])  Physical Exam  General:  alert and well-developed.   Head:  normocephalic and atraumatic.   Eyes:  vision grossly intact, pupils equal, pupils round, pupils reactive to light, pupils react to accomodation, and no injection.   Nose:  no external deformity.   Mouth:  pharynx pink and moist.   Neck:  supple and no masses.   Lungs:  Normal respiratory effort.  Slightly decreased breath sounds across all lung fields.  Scattered wheezes. Heart:  normal rate, regular rhythm, no murmur, no gallop, and no rub.   Cervical Nodes:  no anterior cervical adenopathy and no posterior cervical adenopathy.     Impression & Recommendations:  Problem # 1:  ACUTE BRONCHITIS (ICD-466.0)  Patient likely has an acute bronchitis.    Plan- Doxycycline.  Encouraged use of inhaled medications.  Her updated medication list for this problem includes:    Doxycycline Hyclate 100 Mg Caps (Doxycycline hyclate) .Marland Kitchen..Marland Kitchen Two times a day x 7  Complete Medication List: 1)  Diovan Hct 80-12.5 Mg Tabs (Valsartan-hydrochlorothiazide) .... Take one tablet daily 2)  Multivitamins Tabs (Multiple vitamin) .Marland Kitchen.. 1 by mouth once daily 3)  Calcium-d 600-200 Mg-unit Tabs (Calcium carbonate-vitamin d) .... Take 3 tablet by mouth once a day 4)  Aleve 220 Mg Tabs (Naproxen sodium) .Marland Kitchen.. 1 two times a day as needed 5)  Zolpidem Tartrate 10 Mg Tabs (Zolpidem tartrate) .Marland Kitchen.. 1 by mouth at bedtime 6)  Welchol 625 Mg Tabs (Colesevelam hcl) .... Take 2 tablets by mouth every morning 7)  Lomotil 2.5-0.025 Mg Tabs (Diphenoxylate-atropine) .... Take 1 tablet by mouth at bedtime 8)  Voltaren 1 % Gel (Diclofenac sodium) .... Apply 4 g to affected knee 4 times a day 9)  Lexapro 10 Mg Tabs (Escitalopram oxalate) .Marland Kitchen.. 1 by mouth q am 10)  Doxycycline Hyclate 100 Mg Caps (Doxycycline  hyclate) .... Two times a day x 7 Prescriptions: DOXYCYCLINE HYCLATE 100 MG CAPS (DOXYCYCLINE HYCLATE) two times a day x 7  #14 x 0   Entered and Authorized by:   [name] MD   Signed by:   [name] MD on 01/22/2010   Method used:   Handwritten   RxID:   [id number]

## 2010-10-19 NOTE — Assessment & Plan Note (Signed)
Summary: BP CHECK/LD   Nurse Visit   Vital Signs:  Patient profile:   77 year old female Pulse rate:   80 / minute BP supine:   148 / 78  (right arm) BP sitting:   140 / 78  (right arm) BP standing:   142 / 70  (right arm) Cuff size:   regular  Vitals Entered By: Lamar Sprinkles, CMA (February 18, 2010 11:18 AM) CC: BP recheck/SD   Allergies: 1)  ! Penicillin 2)  ! Sulfa 3)  Amoxicillin (Amoxicillin) 4)  Sulfamethoxazole (Sulfamethoxazole)

## 2010-10-19 NOTE — Progress Notes (Signed)
Summary: UA/Neg  Phone Note Outgoing Call   Reason for Call: Discuss lab or test results Summary of Call: please call patient: U/A negative - but this was after the first day of antibiotics. finish the course of cipro Initial call taken by: [name] MD,  August 26, 2010 9:03 AM  Follow-up for Phone Call        Called pt spoke with husband gave md response Follow-up by: Orlan Leavens RMA,  August 26, 2010 9:37 AM

## 2010-10-19 NOTE — Assessment & Plan Note (Signed)
Summary: pt req f/u appt/#/cd   Vital Signs:  Patient profile:   77 year old female Height:      65 inches (165.10 cm) Weight:      139.38 pounds BMI:     23.28 O2 Sat:      94 % on Room air Temp:     96.8 degrees F oral Pulse rate:   76 / minute Pulse rhythm:   regular BP sitting:   116 / 76  (left arm) Cuff size:   regular  Vitals Entered ByMorrie Sheldon Johnson(December 15, 2009 11:18 AM)  O2 Flow:  Room air CC: pt here for f/u visit/had carotid study on 11/12/09/pt wants labs done/allergies/aj   Primary Care Provider:  Illene Regulus, MD  CC:  pt here for f/u visit/had carotid study on 11/12/09/pt wants labs done/allergies/aj.  History of Present Illness: Patient presents for routine medical follow-up. Her husband is doing better after RF ablation of renal nodules. He also had valvular surgery - he was left with paralyzed  right hemidiaphram. He is overall doing better.   She finds she is craving yogurt. She c/o mid-abdominal mild discomfort which the yogurt helps.   Her hands are better with almost 100% restored function. Her husband's illness and other family issues have kept her from picking up her paint brush.  Current Medications (verified): 1)  Diovan Hct 80-12.5 Mg  Tabs (Valsartan-Hydrochlorothiazide) .... Take One Tablet Daily 2)  Multivitamins   Tabs (Multiple Vitamin) .Marland Kitchen.. 1 By Mouth Once Daily 3)  Calcium-D 600-200 Mg-Unit Tabs (Calcium Carbonate-Vitamin D) .... Take 3 Tablet By Mouth Once A Day 4)  Aleve 220 Mg Tabs (Naproxen Sodium) .Marland Kitchen.. 1 Two Times A Day As Needed 5)  Zolpidem Tartrate 10 Mg Tabs (Zolpidem Tartrate) .Marland Kitchen.. 1 By Mouth At Bedtime 6)  Welchol 625 Mg Tabs (Colesevelam Hcl) .... Take 2 Tablets By Mouth Every Morning 7)  Lomotil 2.5-0.025 Mg Tabs (Diphenoxylate-Atropine) .... Take 1 Tablet By Mouth At Bedtime 8)  Voltaren 1 % Gel (Diclofenac Sodium) .... Apply 4 G To Affected Knee 4 Times A Day 9)  Lexapro 10 Mg Tabs (Escitalopram Oxalate) .Marland Kitchen.. 1 By Mouth Q  Am  Allergies (verified): 1)  ! Penicillin 2)  ! Sulfa 3)  Amoxicillin (Amoxicillin) 4)  Amoxicillin (Amoxicillin) 5)  Sulfamethoxazole (Sulfamethoxazole) 6)  Sulfamethoxazole (Sulfamethoxazole)  Past History:  Past Medical History: Last updated: 12/10/2008 PLEURAL EFFUSION, LEFT (ICD-511.9) COLLES' FRACTURE, LEFT (ICD-813.41) COPD (ICD-496) ANXIETY STATE, UNSPECIFIED (ICD-300.00) BACK PAIN, THORACIC REGION, RIGHT (ICD-724.1) CHEST PAIN UNSPECIFIED (ICD-786.50) PRURITUS (ICD-698.9) DIASTOLIC DYSFUNCTION (ICD-429.9) DYSPNEA ON EXERTION (ICD-786.09) PULMONARY NODULE (ICD-518.89) ASTHMA (ICD-493.90) UPPER RESPIRATORY INFECTION (ICD-465) IRRITABLE BOWEL SYNDROME (ICD-564.1) CRAMP OF LIMB (ICD-729.82) ALLERGIC RHINITIS, CHRONIC (ICD-477.9) BRONCHIECTASIS (ICD-494.0) INSOMNIA UNSPECIFIED (ICD-780.52) HX, PNEUMONIA (ICD-V12.61) DIVERTICULOSIS,  HX OF (ICD-V12.79) Hx of POLYP, COLON (ICD-211.3) HYPERTENSION (ICD-401.9) #DYSPNEA - since Jan 2009 folowing 'pneumonia' v 20 yr history..................................Marland KitchenRamaswamy -> Suspect due to COPD (See below) with BD response: ON symbicort since 12/2007. Refused spiriva. -> hx of remote smoking 12 pack year. Quit 1960. ? under-reporting history -> Diastolic dysfunction with LVH 1/61 2 D echo but normal BNP and D-dimer 4.8.2009 -> Low Prob VQ scan 12/19/2007 -> Dextro convex scoliois on Bone Scan 01/05/2008 -> PFT 12/05/2007: obstructive spirometry FEv1 1.05L/59% with 11% BD response and low dlco, restricted lung volume due to scoliosis -> Spiro 10/21/2008: Fev1 0.92L/50%, FVC 1.6L/60%, Ratio 59 (78) -> CT Chest 10/05/2007 -> 10/16/2007: hyperinflation and stable micronodules  -> Hgb 14.2gm% on 01/24/2008 #  PULMONARY MICRONODULES.......................................................Marland KitchenRamaswamy -> Stable on ct chest 10/05/2007 to 10/15/2008 -> No further followup Multiple Rib fractures-bilaterally 2/10  Physician Roster:      Gyn - Jarold Motto       GI- Marina Goodell ( will not see Jarold Motto)      Card - Brackbill      Ortho- Gioffre      Pulm - M Ramaswamy      Hand Surgery - [facility]  Past Surgical History: Last updated: 12/10/2008 TONSILLECTOMY, HX OF (ICD-V45.79) APPENDECTOMY, HX OF (ICD-V45.79) CHOLECYSTECTOMY, HX OF (ICD-V45.79) POLYPECTOMY, HX OF (ICD-V15.9) A-P bladder suspension '05 Rotator cuff repair'06  ORIF left wrist '10 [facility])  Family History: Last updated: 08/10/2009 No FH of Colon Cancer: Family History of Heart Disease: Mother  Social History: Last updated: 08/10/2009 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 Remains very active, but can't play tennis Patient was apparently an ex-smoker, however, husband does not recollect patient smoking. Stressful move to smaller quarters (Fall '09)  Risk Factors: Caffeine Use: 0 (08/02/2007) Exercise: yes (08/02/2007)  Risk Factors: Smoking Status: quit (06/12/2007) Passive Smoke Exposure: no (08/02/2007)  Review of Systems  The patient denies anorexia, fever, weight loss, weight gain, decreased hearing, hoarseness, chest pain, syncope, dyspnea on exertion, prolonged cough, headaches, abdominal pain, severe indigestion/heartburn, incontinence, muscle weakness, suspicious skin lesions, difficulty walking, depression, abnormal bleeding, enlarged lymph nodes, and angioedema.    Physical Exam  General:  WNWD well groomed woman who appears almost radiant and "in the pink." Head:  normocephalic and atraumatic.   Eyes:  vision grossly intact, pupils equal, pupils round, corneas and lenses clear, and no injection.   Neck:  supple.   Lungs:  Normal respiratory effort, chest expands symmetrically. Lungs are clear to auscultation, no crackles or wheezes. Heart:  normal rate and regular rhythm.   Msk:  hands and arms with good alignment, no swelling, normal range of motion Pulses:  2+ radial Neurologic:  alert &  oriented X3, cranial nerves II-XII intact, and gait normal.   Skin:  turgor normal and color normal.   Psych:  Oriented X3, memory intact for recent and remote, normally interactive, good eye contact, and not anxious appearing.     Impression & Recommendations:  Problem # 1:  FRACTURE, WRIST, RIGHT (ICD-814.00) wrist fractures with good recovery  Problem # 2:  ANXIETY STATE, UNSPECIFIED (ICD-300.00) stable, Handling multiple stressors, husband's health and daughters problems, well.  Plan - continue lexapro  Her updated medication list for this problem includes:    Lexapro 10 Mg Tabs (Escitalopram oxalate) .Marland Kitchen... 1 by mouth q am  Problem # 3:  IRRITABLE BOWEL SYNDROME (ICD-564.1) May be having some mild symptoms.  Plan - use of align on a regular basis           use of otc H2 blockers for dyspepsia  Problem # 4:  POLYPECTOMY, HX OF (ICD-V15.9) Coming up due for follow-up colonoscopy. Afater last procedure she did have bleed from poypectomy site thus she is apprehensive about repeat procedure. Reassured her that there isn't an increased risk associated with repeat colonoscopy. She will discuss risks and benefits with Dr. Juanda Chance.  Problem # 5:  OTHER URINARY INCONTINENCE (ICD-788.39) Chronic problem for which she has had consultation.  Recommend she discuss the possibilty of peroneal nerve stimulation as treatment with Dr. Vonita Moss.  Complete Medication List: 1)  Diovan Hct 80-12.5 Mg Tabs (Valsartan-hydrochlorothiazide) .... Take one tablet daily 2)  Multivitamins Tabs (Multiple  vitamin) .Marland Kitchen.. 1 by mouth once daily 3)  Calcium-d 600-200 Mg-unit Tabs (Calcium carbonate-vitamin d) .... Take 3 tablet by mouth once a day 4)  Aleve 220 Mg Tabs (Naproxen sodium) .Marland Kitchen.. 1 two times a day as needed 5)  Zolpidem Tartrate 10 Mg Tabs (Zolpidem tartrate) .Marland Kitchen.. 1 by mouth at bedtime 6)  Welchol 625 Mg Tabs (Colesevelam hcl) .... Take 2 tablets by mouth every morning 7)  Lomotil 2.5-0.025 Mg Tabs  (Diphenoxylate-atropine) .... Take 1 tablet by mouth at bedtime 8)  Voltaren 1 % Gel (Diclofenac sodium) .... Apply 4 g to affected knee 4 times a day 9)  Lexapro 10 Mg Tabs (Escitalopram oxalate) .Marland Kitchen.. 1 by mouth q am  Patient Instructions: 1)  For bowel flora, that discomfort in the mid-abdomen, try align. 2)  For wine intolerance - this may be stomach acid. Try taking Zantac or similar product on a regular basis to reduce the stomach acid.  3)  Bladder function - ask Dr. Vonita Moss about peroneal nerve stimulation for better bladder control. Ask him about Dr. Perley Jain. 4)  Ask Dr. Juanda Chance - what is the risk of recurrent polyp; if there is a polyp what is the risk of malignant transformation over the next 13-15 years. Remember that you are not at any "increased risk" of a complication of colonoscopy based on the previous experience.  Prescriptions: ZOLPIDEM TARTRATE 10 MG TABS (ZOLPIDEM TARTRATE) 1 by mouth at bedtime  #90 x 3   Entered and Authorized by:   [name] MD   Signed by:   [name] MD on 12/15/2009   Method used:   Print then Give to Patient   RxID:   [id number]    Preventive Care Screening  Bone Density:    Date:  09/19/2008    Results:  normal std dev

## 2010-10-19 NOTE — Miscellaneous (Signed)
Summary: Orders Update  Clinical Lists Changes  Problems: Added new problem of CAROTID ARTERY DISEASE (ICD-433.10) Orders: Added new Test order of Carotid Duplex (Carotid Duplex) - Signed 

## 2010-10-19 NOTE — Progress Notes (Signed)
Summary: Triage   Phone Note Call from Patient Call back at Home Phone [phone]   Caller: Patient Call For: Dr. Juanda Chance Reason for Call: Talk to Nurse Summary of Call: pain lower abd, rectal bleeding x1 week Initial call taken by: Karna Christmas,  August 02, 2010 8:38 AM  Follow-up for Phone Call        Message left for patient to call back. Jesse Fall RN  August 02, 2010 9:48 AM Patient called back. She report she had a "bug that was going around" last Tues and Wed. She left for [place] on Thursday and had trouble the entire time. States she has diarrhea or loose stools 5-6 times/day. Bright, red blood with stool that colors the water at times. Lower left abdominal pain below the belly button. Describes pain as sharp discomfort. Denies hx of hemorrhoids. Patient did not take anything for diarrhea because she was out of town but she will try Imodium. Patient scheduled with Willette Cluster, RNP on 08/03/10 at 1:30 PM per her request.  Follow-up by: Jesse Fall RN,  August 02, 2010 12:18 PM  Additional Follow-up for Phone Call Additional follow up Details #1::        Mrs Charnley has a IBS with diarrhea. Analpram cream 2.5% three times a day as needed bleeding, #15gm, OK to see Gunnar Fusi. Librax has worked well for her. Additional Follow-up by: Hart Carwin MD,  August 02, 2010 11:12 PM     Appended Document: Triage    Clinical Lists Changes       Appended Document: Triage    Clinical Lists Changes  Medications: Added new medication of ANALPRAM-HC 1-2.5 % CREA (HYDROCORTISONE ACE-PRAMOXINE) Apply to rectum three times a day as needed rectal bleeding - Signed Rx of ANALPRAM-HC 1-2.5 % CREA (HYDROCORTISONE ACE-PRAMOXINE) Apply to rectum three times a day as needed rectal bleeding;  #15 GM x 0;  Signed;  Entered by: Jesse Fall RN;  Authorized by: Hart Carwin MD;  Method used: Electronically to [facility] [address] [place], [place]  [id number], Ph:  [phone] or [phone], Fax: [phone]    Prescriptions: ANALPRAM-HC 1-2.5 % CREA (HYDROCORTISONE ACE-PRAMOXINE) Apply to rectum three times a day as needed rectal bleeding  #15 GM x 0   Entered by:   Jesse Fall RN   Authorized by:   Hart Carwin MD   Signed by:   Jesse Fall RN on 08/03/2010   Method used:   Electronically to        [facility] (retail)       [address]       [place], [place]  [id number]       Ph: [phone] or [phone]       Fax: [phone]   RxID:   [id number]    Appended Document: Triage Message left for patient to call back. Jesse Fall, RN 08/03/10 1:10 PM  Appended Document: Triage See OV note from 08/03/10.

## 2010-10-19 NOTE — Assessment & Plan Note (Signed)
   Primary Care Provider:  Illene Regulus, MD   History of Present Illness: Patient presents for follow-up of hypertension and gait disorder. See nurses note for BPreadings.   Allergies: 1)  ! Penicillin 2)  ! Sulfa 3)  Amoxicillin (Amoxicillin) 4)  Sulfamethoxazole (Sulfamethoxazole) PMH-FH-SH reviewed-no changes except otherwise noted  Review of Systems  The patient denies decreased hearing, chest pain, syncope, dyspnea on exertion, prolonged cough, headaches, muscle weakness, and difficulty walking.    Physical Exam  General:  WNWD white woman in no distress Head:  Normocephalic and atraumatic without obvious abnormalities. No apparent alopecia or balding. Eyes:  pupils equal, pupils round, and corneas and lenses clear.   Lungs:  normal respiratory effort.   Heart:  normal rate and regular rhythm.   Neurologic:  alert & oriented X3 and cranial nerves II-XII intact.  gait with mild broad based gait and favors left leg. Psych:  Oriented X3, memory intact for recent and remote, normally interactive, and good eye contact.     Impression & Recommendations:  Problem # 1:  ATAXIA (ICD-781.3) Patient had MRI brain with contrast which revealed no strokes, mass occupying lesions or other abnormalities. She has changes that are expected for age. No hemorrhage or circulatory problems. Suspect her ataxia is do to knee problems. No hypertensive related damage.  Plan - no further evaluation at this time.  Problem # 2:  HYPERTENSION (ICD-401.9) Much better control on medication.  PLan - continue present medication LONG TERM.  Her updated medication list for this problem includes:    Diovan Hct 160-12.5 Mg Tabs (Valsartan-hydrochlorothiazide) .Marland Kitchen... 1 by mouth once daily  Complete Medication List: 1)  Diovan Hct 160-12.5 Mg Tabs (Valsartan-hydrochlorothiazide) .Marland Kitchen.. 1 by mouth once daily 2)  Multivitamins Tabs (Multiple vitamin) .Marland Kitchen.. 1 by mouth once daily 3)  Calcium-d 600-200 Mg-unit  Tabs (Calcium carbonate-vitamin d) .... Take 3 tablet by mouth once a day 4)  Aleve 220 Mg Tabs (Naproxen sodium) .Marland Kitchen.. 1 two times a day as needed 5)  Zolpidem Tartrate 10 Mg Tabs (Zolpidem tartrate) .Marland Kitchen.. 1 by mouth at bedtime 6)  Welchol 625 Mg Tabs (Colesevelam hcl) .... Take 2 tablets by mouth every morning 7)  Lomotil 2.5-0.025 Mg Tabs (Diphenoxylate-atropine) .... Take 1 tablet by mouth at bedtime 8)  Voltaren 1 % Gel (Diclofenac sodium) .... Apply 4 g to affected knee 4 times a day 9)  Lexapro 10 Mg Tabs (Escitalopram oxalate) .Marland Kitchen.. 1 by mouth q am 10)  Doxycycline Hyclate 100 Mg Caps (Doxycycline hyclate) .... Two times a day x 7 11)  Hydroxyzine Hcl 10 Mg Tabs (Hydroxyzine hcl) .Marland Kitchen.. 1 tab at bedtime as needed

## 2010-10-19 NOTE — Letter (Signed)
Summary: Colonoscopy-Changed to Office Visit Letter  [facility]  [address] [place], [place] [address]   Phone: [phone]  Fax: [phone]      November 10, 2009 MRN: [id number]   [facility] [address] [place], [place]  [address]   Dear Ms. BLATCHFORD,   According to our records, it is time for you to schedule a Colonoscopy. However, after reviewing your medical record, I feel that an office visit would be most appropriate to more completely evaluate you and determine your need for a repeat procedure.  Please call [phone] (option #2) at your convenience to schedule an office visit. If you have any questions, concerns, or feel that this letter is in error, we would appreciate your call.   Sincerely,  [name]. Juanda Chance, M.D.  [facility] [phone]

## 2010-10-19 NOTE — Progress Notes (Signed)
Summary: OV TODAY  Phone Note Call from Patient   Caller: [phone] Summary of Call: Pt currently is in [place]. She fainted at the [employer] and then at the grocery store. She was seen by an MD saturday. He had her transported via ambulance to [facility]. BP at office visit was 216/110. Pt was admitted overnight for irregular heart rhythm on EKG and UTI. She does not feel well today and wants office visit w/Kimberleigh Mehan. Scheduled for today at 4:30 and husband will get records from hosptial. Advised to call office w/any changes in symptoms.  Initial call taken by: Lamar Sprinkles, CMA,  Feb 16, 2010 9:17 AM

## 2010-10-20 NOTE — Discharge Summary (Signed)
  NAMEARYA, LUTTRULL NO.:  [id number]  MEDICAL RECORD NO.:  [id number]          PATIENT TYPE:  INP  LOCATION:  1606                         FACILITY:  [facility]  PHYSICIAN:  Ollen Gross, M.D.    DATE OF BIRTH:  03/16/1925  DATE OF ADMISSION:  10/01/2010 DATE OF DISCHARGE:  10/05/2010                              DISCHARGE SUMMARY   HOSPITAL COURSE:  On postoperative day #2 her hemoglobin was noted to be down to 7.2 and it was felt that she would benefit from undergoing transfusion.  She was given 2 units of blood.  She wanted to go home after the hospital stay so we got Discharge Planning involved but she still needed some more therapy.  She tolerated the blood well.  She was seen by Dr. Debby Bud postoperatively from a courtesy standpoint.  She was doing well postoperatively from their standpoint and she felt much better after receiving the blood.  On postoperative day #3 her hemoglobin was back up to 9.7.  She had not quite met all of her therapy goals though, so we kept her another day.  Incision looked good. Continued to progress and then by day four she was doing much better with her physical therapy.  She was tolerating her medications and discharged home.  DISCHARGE PLAN: 1. The patient was discharged home on October 05, 2010. 2. Discharge diagnoses, please see above.  DISCHARGE MEDICATIONS:  Nu-Iron, Robaxin, Percocet, warfarin.  Continue Diovan HCTZ, hydrocortisone cream, hydroxyzine, Symbicort, WelChol and generic Ambien.  DIET:  Heart-healthy diet.  ACTIVITY:  She is weightbearing as tolerated, total-knee protocol.  Home health PT and home health nursing.  FOLLOWUP:  Follow up in 2 weeks.  DISPOSITION:  Home.  CONDITION ON DISCHARGE:  Improving.  CONSULTATIONS:  Courtesy consult during hospital Dr. Illene Regulus.     Alexzandrew L. Julien Girt, P.A.C.   ______________________________ Ollen Gross, M.D.    ALP/MEDQ  D:  10/14/2010   T:  10/14/2010  Job:  [id number]  cc:   Rosalyn Gess. Norins, MD [address] [place] [address]  Electronically Signed by Patrica Duel P.A.C. on 10/20/2010 03:20:53 PM Electronically Signed by Ollen Gross M.D. on 10/20/2010 03:39:27 PM

## 2010-10-20 NOTE — Discharge Summary (Signed)
NAMEMILTON, STREICHER NO.:  [id number]  MEDICAL RECORD NO.:  [id number]          PATIENT TYPE:  INP  LOCATION:  1606                         FACILITY:  [facility]  PHYSICIAN:  Ollen Gross, M.D.    DATE OF BIRTH:  03/16/1925  DATE OF ADMISSION:  10/01/2010 DATE OF DISCHARGE:  10/05/2010                              DISCHARGE SUMMARY   ADMITTING DIAGNOSES: 1. End-stage osteoarthritis, right knee. 2. Anxiety. 3. History of shingles. 4. History of asthma. 5. Glaucoma. 6. Hypertension. 7. Irritable bowel syndrome. 8. Urinary incontinence. 9. History of urinary tract infections. 10.Childhood illnesses, measles, mumps. 11.Postmenopausal.  DISCHARGE DIAGNOSES: 1. Osteoarthritis, right knee, status post right total knee     replacement arthroplasty. 2. Postoperative acute blood loss anemia. 3. Status post transfusion. 4. Postoperative hyponatremia. 5. Anxiety. 6. History of shingles. 7. History of asthma. 8. Glaucoma. 9. Hypertension. 10.Irritable bowel syndrome. 11.Urinary incontinence. 12.History of urinary tract infections. 13.Childhood illnesses, measles, mumps. 14.Postmenopausal.  PROCEDURE:  October 01, 2010, right total knee.  SURGEON:  Ollen Gross, MD  ASSISTANT:  Alexzandrew L. Perkins, PA-C.  ANESTHESIA:  Spinal.  TOURNIQUET TIME:  32 minutes.  CONSULTS:  None.  BRIEF HISTORY:  Ms. Vangorden is an 77 year old female with end-stage arthritis of the right knee, progressive worsening pain and dysfunction, failed conservative management, now presents for a total knee arthroplasty.  LABORATORY DATA:  Preop CBC showed hemoglobin of 14.6, hematocrit of 43.4, white cell count of 6.2, platelets 212; postop hemoglobin 8.7 and got as low as 7.2, given 2 units.  Postprocedure hemoglobin 9.7 with hematocrit of 29.3.  PT/INR 13.3 and 0.99 respectively with PTT of 29. Serial proTimes followed per Coumadin protocol.  Last known PT/INR is 24.1 and 2.14.   Chem panel on admission all within normal limits. Serial BMETs are followed.  Sodium dropped from 138 to 134, back up to 138; BUN went up from 21 to 24, back down to 14; glucose went up from 89 to 148, down to 97.  Hemoglobin A1c normal at 5.7.  Preop UA, small leukocyte esterase, rare epithelials, 0 to 2 white cells, rare bacteria. Blood group type O+.  Nasal swabs were negative for Staph aureus, negative for MRSA.  Chest x-ray, September 27, 2010, chronic lung disease, no acute pulmonary findings.  HOSPITAL COURSE:  The patient admitted to [facility], taken to OR, underwent above-stated procedure without complication.  The patient tolerated procedure well, later transferred to recovery room on orthopedic floor, started on p.o. and IV analgesics for pain control following surgery.  Hemovac drain was pulled.  Started on PCA initially and that was discontinued on postop day #1, started getting up out of bed, slow progression by day 2.  The patient's hemoglobin was down to 7.2.  It was felt due to the low hemoglobin that she would benefit from undergoing surgical intervention...  Dictation ended at this point.     Alexzandrew L. Julien Girt, P.A.C.   ______________________________ Ollen Gross, M.D.    ALP/MEDQ  D:  10/14/2010  T:  10/14/2010  Job:  [id number]  cc:   Rosalyn Gess. Norins,  MD [address] [place] [address]  Electronically Signed by Patrica Duel P.A.C. on 10/20/2010 03:21:13 PM Electronically Signed by Ollen Gross M.D. on 10/20/2010 03:39:31 PM

## 2010-10-21 NOTE — Progress Notes (Signed)
Summary: CLEARANCE  Phone Note Call from Patient   Summary of Call: Pt just called office. She has knee replacement surgery at 7 am tomorrow. She read over info and called to urgently request clearance due recent upper respiratory problems. Per MD, pt is clear for surgery tomorrow. Will fax info to regarding this and another copy of original clearance to surgeon.  Pt has no fever or cough and overall feels better.  Initial call taken by: Lamar Sprinkles, CMA,  September 30, 2010 4:55 PM  Follow-up for Phone Call        reviewed last office note. Patient is recovered from respiratory infection. Reviewed note from Nov 4 - she remains medically cleared for surgery.  Follow-up by: [name] MD,  September 30, 2010 6:15 PM  Additional Follow-up for Phone Call Additional follow up Details #1::        Faxed to surgeon Additional Follow-up by: Lamar Sprinkles, CMA,  September 30, 2010 6:26 PM

## 2010-10-21 NOTE — Progress Notes (Signed)
Summary: Irregular HR  Phone Note From Other Clinic   Caller: [phone] [address] [facility] Care Summary of Call: [facility] health nurse called. Pt's apical HR was slightly irregular today. Pt reported no history of this to RN. This is fyi for MD  but nurse would like a call back to confirm message.  Initial call taken by: Lamar Sprinkles, CMA,  October 08, 2010 5:37 PM  Follow-up for Phone Call        called back to confirm msg- Follow-up by: [name] MD,  October 08, 2010 6:24 PM

## 2010-10-21 NOTE — Assessment & Plan Note (Signed)
Summary: sore throat/flatuence/cd   Vital Signs:  Patient profile:   77 year old female Height:      64 inches Weight:      140 pounds BMI:     24.12 O2 Sat:      96 % on Room air Temp:     97.6 degrees F oral Pulse rate:   75 / minute BP sitting:   120 / 80  (left arm) Cuff size:   regular  Vitals Entered By: Bill Salinas CMA (September 17, 2010 11:34 AM)  O2 Flow:  Room air CC: pt here with c/o cough chest congestion and sore throat x 2 days/ ab   Primary Care Dewan Emond:  Illene Regulus, MD  CC:  pt here with c/o cough chest congestion and sore throat x 2 days/ ab.  History of Present Illness: Patient with h/o bronchiectasis and recurrent respiratory infections presents with cough producitve of a light sputum and mild increased SOB. She has not had any fever, rigors, chest pain. No GI symptoms.  It is her 46 wedding anniversary!!  Current Medications (verified): 1)  Diovan Hct 160-12.5 Mg Tabs (Valsartan-Hydrochlorothiazide) .Marland Kitchen.. 1 By Mouth Once Daily 2)  Multivitamins   Tabs (Multiple Vitamin) .Marland Kitchen.. 1 By Mouth Once Daily 3)  Calcium-D 600-200 Mg-Unit Tabs (Calcium Carbonate-Vitamin D) .... Take 3 Tablet By Mouth Once A Day 4)  Aleve 220 Mg Tabs (Naproxen Sodium) .Marland Kitchen.. 1 Two Times A Day As Needed 5)  Zolpidem Tartrate 10 Mg Tabs (Zolpidem Tartrate) .Marland Kitchen.. 1 By Mouth At Bedtime 6)  Welchol 625 Mg Tabs (Colesevelam Hcl) .... Take 2 Tablets By Mouth Every Morning 7)  Voltaren 1 % Gel (Diclofenac Sodium) .... Apply 4 G To Affected Knee 4 Times A Day 8)  Hydroxyzine Hcl 10 Mg Tabs (Hydroxyzine Hcl) .Marland Kitchen.. 1 Tab At Bedtime As Needed 9)  Lumigan 0.01 % Soln (Bimatoprost) .Marland Kitchen.. 1 Drop Both Eyes Daily 10)  Nitrofurantoin Macrocrystal 50 Mg Caps (Nitrofurantoin Macrocrystal) .Marland Kitchen.. 1 By Mouth Once Daily 11)  Symbicort 80-4.5 Mcg/act Aero (Budesonide-Formoterol Fumarate) .... 2 Inhalations Two Times A Day 12)  Analpram-Hc 1-2.5 % Crea (Hydrocortisone Ace-Pramoxine) .... Apply To Rectum Three  Times A Day As Needed Rectal Bleeding 13)  Cipro 250 Mg Tabs (Ciprofloxacin Hcl) .Marland Kitchen.. 1 By Mouth Two Times A Day X 7  Allergies (verified): 1)  ! Penicillin 2)  ! Sulfa 3)  Amoxicillin (Amoxicillin)  Past History:  Past Medical History: Last updated: 12/10/2008 PLEURAL EFFUSION, LEFT (ICD-511.9) COLLES' FRACTURE, LEFT (ICD-813.41) COPD (ICD-496) ANXIETY STATE, UNSPECIFIED (ICD-300.00) BACK PAIN, THORACIC REGION, RIGHT (ICD-724.1) CHEST PAIN UNSPECIFIED (ICD-786.50) PRURITUS (ICD-698.9) DIASTOLIC DYSFUNCTION (ICD-429.9) DYSPNEA ON EXERTION (ICD-786.09) PULMONARY NODULE (ICD-518.89) ASTHMA (ICD-493.90) UPPER RESPIRATORY INFECTION (ICD-465) IRRITABLE BOWEL SYNDROME (ICD-564.1) CRAMP OF LIMB (ICD-729.82) ALLERGIC RHINITIS, CHRONIC (ICD-477.9) BRONCHIECTASIS (ICD-494.0) INSOMNIA UNSPECIFIED (ICD-780.52) HX, PNEUMONIA (ICD-V12.61) DIVERTICULOSIS,  HX OF (ICD-V12.79) Hx of POLYP, COLON (ICD-211.3) HYPERTENSION (ICD-401.9) #DYSPNEA - since Jan 2009 folowing 'pneumonia' v 20 yr history..................................Marland KitchenRamaswamy -> Suspect due to COPD (See below) with BD response: ON symbicort since 12/2007. Refused spiriva. -> hx of remote smoking 12 pack year. Quit 1960. ? under-reporting history -> Diastolic dysfunction with LVH 8/11 2 D echo but normal BNP and D-dimer 4.8.2009 -> Low Prob VQ scan 12/19/2007 -> Dextro convex scoliois on Bone Scan 01/05/2008 -> PFT 12/05/2007: obstructive spirometry FEv1 1.05L/59% with 11% BD response and low dlco, restricted lung volume due to scoliosis -> Spiro 10/21/2008: Fev1 0.92L/50%, FVC 1.6L/60%, Ratio 59 (78) -> CT Chest 10/05/2007 ->  10/16/2007: hyperinflation and stable micronodules  -> Hgb 14.2gm% on 01/24/2008 #PULMONARY MICRONODULES.......................................................Marland KitchenRamaswamy -> Stable on ct chest 10/05/2007 to 10/15/2008 -> No further followup Multiple Rib fractures-bilaterally 2/10  Physician Roster:      Gyn -  Jarold Motto      GI- Marina Goodell ( will not see Jarold Motto)      Card - Brackbill      Ortho- Gioffre      Pulm - M Ramaswamy      Hand Surgery - [facility]  Past Surgical History: Last updated: 12/10/2008 TONSILLECTOMY, HX OF (ICD-V45.79) APPENDECTOMY, HX OF (ICD-V45.79) CHOLECYSTECTOMY, HX OF (ICD-V45.79) POLYPECTOMY, HX OF (ICD-V15.9) A-P bladder suspension '05 Rotator cuff repair'06  ORIF left wrist '10 Mina Marble) FH reviewed for relevance, SH/Risk Factors reviewed for relevance  Review of Systems       The patient complains of hoarseness, dyspnea on exertion, and prolonged cough.  The patient denies anorexia, fever, weight loss, decreased hearing, chest pain, syncope, peripheral edema, abdominal pain, muscle weakness, difficulty walking, and enlarged lymph nodes.    Physical Exam  General:  WNWD white woman who is well groomed and charming. Head:  normocephalic and atraumatic.   Eyes:  C&S clear Neck:  supple.   Chest Wall:  no deformities and no tenderness.   Lungs:  normal respiratory effort, no intercostal retractions, and no accessory muscle use.  Scattered rhonchi, no wheezing, no increased work of breathing. Heart:  normal rate and regular rhythm.     Impression & Recommendations:  Problem # 1:  BRONCHIECTASIS (ICD-494.0) Exacerbation of her symptoms but in no severe distress and her exam is not worrisome.  Plan Doxycycline 100mg  two times a day for 7 days        continue all her routine pulmonary medications.   Complete Medication List: 1)  Diovan Hct 160-12.5 Mg Tabs (Valsartan-hydrochlorothiazide) .Marland Kitchen.. 1 by mouth once daily 2)  Multivitamins Tabs (Multiple vitamin) .Marland Kitchen.. 1 by mouth once daily 3)  Calcium-d 600-200 Mg-unit Tabs (Calcium carbonate-vitamin d) .... Take 3 tablet by mouth once a day 4)  Aleve 220 Mg Tabs (Naproxen sodium) .Marland Kitchen.. 1 two times a day as needed 5)  Zolpidem Tartrate 10 Mg Tabs (Zolpidem tartrate) .Marland Kitchen.. 1 by mouth at bedtime 6)  Welchol 625 Mg Tabs  (Colesevelam hcl) .... Take 2 tablets by mouth every morning 7)  Voltaren 1 % Gel (Diclofenac sodium) .... Apply 4 g to affected knee 4 times a day 8)  Hydroxyzine Hcl 10 Mg Tabs (Hydroxyzine hcl) .Marland Kitchen.. 1 tab at bedtime as needed 9)  Lumigan 0.01 % Soln (Bimatoprost) .Marland Kitchen.. 1 drop both eyes daily 10)  Nitrofurantoin Macrocrystal 50 Mg Caps (Nitrofurantoin macrocrystal) .Marland Kitchen.. 1 by mouth once daily 11)  Symbicort 80-4.5 Mcg/act Aero (Budesonide-formoterol fumarate) .... 2 inhalations two times a day 12)  Analpram-hc 1-2.5 % Crea (Hydrocortisone ace-pramoxine) .... Apply to rectum three times a day as needed rectal bleeding 13)  Cipro 250 Mg Tabs (Ciprofloxacin hcl) .Marland Kitchen.. 1 by mouth two times a day x 7 14)  Doxycycline Hyclate 100 Mg Tabs (Doxycycline hyclate) .Marland Kitchen.. 1 by mouth two times a day x 7 for bronchitis Prescriptions: DOXYCYCLINE HYCLATE 100 MG TABS (DOXYCYCLINE HYCLATE) 1 by mouth two times a day x 7 for bronchitis  #14 x 0   Entered and Authorized by:   [name] MD   Signed by:   [name] MD on 09/17/2010   Method used:   Electronically to  [facility]* (retail)       [address]       [place], [place]  [id number]       Ph: [phone] or [phone]       Fax: [phone]   RxID:   [id number]    Orders Added: 1)  Est. Patient Level III [34742]

## 2010-10-25 ENCOUNTER — Telehealth: Payer: Self-pay | Admitting: Internal Medicine

## 2010-10-26 ENCOUNTER — Encounter (INDEPENDENT_AMBULATORY_CARE_PROVIDER_SITE_OTHER): Payer: Self-pay | Admitting: *Deleted

## 2010-10-26 ENCOUNTER — Other Ambulatory Visit: Payer: Self-pay | Admitting: Internal Medicine

## 2010-10-26 ENCOUNTER — Telehealth: Payer: Self-pay | Admitting: Internal Medicine

## 2010-10-26 ENCOUNTER — Other Ambulatory Visit: Payer: Self-pay

## 2010-10-26 DIAGNOSIS — R5383 Other fatigue: Secondary | ICD-10-CM

## 2010-10-26 DIAGNOSIS — R5381 Other malaise: Secondary | ICD-10-CM

## 2010-10-26 LAB — BASIC METABOLIC PANEL
BUN: 31 mg/dL — ABNORMAL HIGH (ref 6–23)
Chloride: 105 mEq/L (ref 96–112)
Potassium: 4.7 mEq/L (ref 3.5–5.1)

## 2010-10-26 LAB — CBC WITH DIFFERENTIAL/PLATELET
Basophils Absolute: 0 10*3/uL (ref 0.0–0.1)
Basophils Relative: 0.3 % (ref 0.0–3.0)
Eosinophils Absolute: 0.1 10*3/uL (ref 0.0–0.7)
Lymphocytes Relative: 20.6 % (ref 12.0–46.0)
MCHC: 34.4 g/dL (ref 30.0–36.0)
Neutrophils Relative %: 72.3 % (ref 43.0–77.0)
RBC: 3.75 Mil/uL — ABNORMAL LOW (ref 3.87–5.11)
WBC: 7.6 10*3/uL (ref 4.5–10.5)

## 2010-10-28 ENCOUNTER — Encounter: Payer: Self-pay | Admitting: Internal Medicine

## 2010-10-28 DIAGNOSIS — R209 Unspecified disturbances of skin sensation: Secondary | ICD-10-CM

## 2010-10-28 DIAGNOSIS — I10 Essential (primary) hypertension: Secondary | ICD-10-CM

## 2010-10-29 ENCOUNTER — Telehealth: Payer: Self-pay | Admitting: Internal Medicine

## 2010-10-29 ENCOUNTER — Other Ambulatory Visit: Payer: Self-pay | Admitting: Internal Medicine

## 2010-10-29 DIAGNOSIS — R202 Paresthesia of skin: Secondary | ICD-10-CM

## 2010-10-29 DIAGNOSIS — R209 Unspecified disturbances of skin sensation: Secondary | ICD-10-CM

## 2010-10-30 ENCOUNTER — Ambulatory Visit (HOSPITAL_COMMUNITY)
Admission: RE | Admit: 2010-10-30 | Discharge: 2010-10-30 | Disposition: A | Discharge status: Home / Self Care | Payer: Medicare Other | Source: Ambulatory Visit | Attending: Internal Medicine | Admitting: Internal Medicine

## 2010-10-30 ENCOUNTER — Encounter (HOSPITAL_COMMUNITY): Payer: Self-pay

## 2010-10-30 DIAGNOSIS — G319 Degenerative disease of nervous system, unspecified: Secondary | ICD-10-CM | POA: Insufficient documentation

## 2010-10-30 DIAGNOSIS — R209 Unspecified disturbances of skin sensation: Secondary | ICD-10-CM

## 2010-10-30 DIAGNOSIS — I679 Cerebrovascular disease, unspecified: Secondary | ICD-10-CM | POA: Insufficient documentation

## 2010-10-30 DIAGNOSIS — R202 Paresthesia of skin: Secondary | ICD-10-CM

## 2010-11-01 ENCOUNTER — Telehealth: Payer: Self-pay | Admitting: Internal Medicine

## 2010-11-02 ENCOUNTER — Encounter: Payer: Self-pay | Admitting: Internal Medicine

## 2010-11-02 ENCOUNTER — Telehealth: Payer: Self-pay | Admitting: Internal Medicine

## 2010-11-02 ENCOUNTER — Observation Stay: Admission: AD | Admit: 2010-11-02 | Payer: Self-pay | Source: Ambulatory Visit | Admitting: Internal Medicine

## 2010-11-03 ENCOUNTER — Ambulatory Visit: Payer: Self-pay | Admitting: Cardiology

## 2010-11-03 ENCOUNTER — Other Ambulatory Visit: Payer: Self-pay | Admitting: Internal Medicine

## 2010-11-03 ENCOUNTER — Encounter (INDEPENDENT_AMBULATORY_CARE_PROVIDER_SITE_OTHER): Payer: Self-pay | Admitting: *Deleted

## 2010-11-03 ENCOUNTER — Other Ambulatory Visit: Payer: Medicare Other

## 2010-11-03 DIAGNOSIS — I10 Essential (primary) hypertension: Secondary | ICD-10-CM

## 2010-11-03 LAB — BASIC METABOLIC PANEL
CO2: 32 mEq/L (ref 19–32)
Calcium: 9.1 mg/dL (ref 8.4–10.5)
Glucose, Bld: 90 mg/dL (ref 70–99)
Sodium: 138 mEq/L (ref 135–145)

## 2010-11-04 LAB — CBC WITH DIFFERENTIAL/PLATELET
Eosinophils Relative: 2.1 % (ref 0.0–5.0)
HCT: 36.7 % (ref 36.0–46.0)
Hemoglobin: 12.3 g/dL (ref 12.0–15.0)
Lymphs Abs: 1.8 10*3/uL (ref 0.7–4.0)
Monocytes Relative: 4.5 % (ref 3.0–12.0)
Neutro Abs: 5.2 10*3/uL (ref 1.4–7.7)
WBC: 7.6 10*3/uL (ref 4.5–10.5)

## 2010-11-04 NOTE — Assessment & Plan Note (Signed)
Primary Care Provider:  Illene Regulus, MD   History of Present Illness: Lori [name] walks into the office as an urgent patient with a concern about high BP. She was at ortho rehab earlier and she was noted to have elevated BP that went from 150 to 190 systolic. She does report that starting yesterday she had a pins and needles paresthesia of the right hand - it feels numb. She reports that this is progressively worse since yesterday. She denies any other focal findings. No headache, double vision, focal weakness. She does admit to constant pain from her knee and pain in the buttock and coccyx after a fall.   Allergies: 1)  ! Penicillin 2)  ! Sulfa 3)  Amoxicillin (Amoxicillin)  Past History:  Past Medical History: Last updated: 12/10/2008 PLEURAL EFFUSION, LEFT (ICD-511.9) COLLES' FRACTURE, LEFT (ICD-813.41) COPD (ICD-496) ANXIETY STATE, UNSPECIFIED (ICD-300.00) BACK PAIN, THORACIC REGION, RIGHT (ICD-724.1) CHEST PAIN UNSPECIFIED (ICD-786.50) PRURITUS (ICD-698.9) DIASTOLIC DYSFUNCTION (ICD-429.9) DYSPNEA ON EXERTION (ICD-786.09) PULMONARY NODULE (ICD-518.89) ASTHMA (ICD-493.90) UPPER RESPIRATORY INFECTION (ICD-465) IRRITABLE BOWEL SYNDROME (ICD-564.1) CRAMP OF LIMB (ICD-729.82) ALLERGIC RHINITIS, CHRONIC (ICD-477.9) BRONCHIECTASIS (ICD-494.0) INSOMNIA UNSPECIFIED (ICD-780.52) HX, PNEUMONIA (ICD-V12.61) DIVERTICULOSIS,  HX OF (ICD-V12.79) Hx of POLYP, COLON (ICD-211.3) HYPERTENSION (ICD-401.9) #DYSPNEA - since Jan 2009 folowing 'pneumonia' v 20 yr history..................................Marland KitchenRamaswamy -> Suspect due to COPD (See below) with BD response: ON symbicort since 12/2007. Refused spiriva. -> hx of remote smoking 12 pack year. Quit 1960. ? under-reporting history -> Diastolic dysfunction with LVH 7/82 2 D echo but normal BNP and D-dimer 4.8.2009 -> Low Prob VQ scan 12/19/2007 -> Dextro convex scoliois on Bone Scan 01/05/2008 -> PFT 12/05/2007: obstructive spirometry FEv1  1.05L/59% with 11% BD response and low dlco, restricted lung volume due to scoliosis -> Spiro 10/21/2008: Fev1 0.92L/50%, FVC 1.6L/60%, Ratio 59 (78) -> CT Chest 10/05/2007 -> 10/16/2007: hyperinflation and stable micronodules  -> Hgb 14.2gm% on 01/24/2008 #PULMONARY MICRONODULES.......................................................Marland KitchenRamaswamy -> Stable on ct chest 10/05/2007 to 10/15/2008 -> No further followup Multiple Rib fractures-bilaterally 2/10  Physician Roster:      Gyn - Jarold Motto      GI- Marina Goodell ( will not see Jarold Motto)      Card - Brackbill      Ortho- Gioffre      Pulm - M Ramaswamy      Hand Surgery - [facility]  Past Surgical History: Last updated: 12/10/2008 TONSILLECTOMY, HX OF (ICD-V45.79) APPENDECTOMY, HX OF (ICD-V45.79) CHOLECYSTECTOMY, HX OF (ICD-V45.79) POLYPECTOMY, HX OF (ICD-V15.9) A-P bladder suspension '05 Rotator cuff repair'06  ORIF left wrist '10 Mina Marble) FH reviewed for relevance, SH/Risk Factors reviewed for relevance  Review of Systems       The patient complains of difficulty walking.  The patient denies anorexia, fever, weight loss, weight gain, vision loss, decreased hearing, hoarseness, chest pain, syncope, dyspnea on exertion, prolonged cough, abdominal pain, severe indigestion/heartburn, muscle weakness, depression, abnormal bleeding, and enlarged lymph nodes.    Physical Exam  General:  older white woman who is in no acute distress but is very anxious Head:  normocephalic and atraumatic.   Eyes:  pupils equal, pupils round, and corneas and lenses clear.   Neck:  supple.   Lungs:  normal respiratory effort and normal breath sounds.   Heart:  normal rate and regular rhythm.   Msk:  right knee with good range of motion Pulses:  2+ radial pulse Neurologic:  Awake, alert and oriented x 4 -person,place, time and context. Speech is clear. Cognition is normal. CN II-XII -  nl facial symmetry and movement, EOMI, no deviation of tongue, no tongue  fasiculations, vision grossly normal. MS- nl grip strentht and UE strength, nl LE strength. Cerebellar - tremor both hands at rest and with intention. Very positive Tinel's sign right wrist, positive phalen's right wrist. Nl gait, able to stand w/o assist, can balance on one leg Skin:  turgor normal and color normal.   Psych:  Oriented X3, normally interactive, good eye contact, and severely anxious.     Impression & Recommendations:  Problem # 1:  HYPERTENSION (ICD-401.9) Patient with report of SBP up to 190. In the office in the left arm SBP ranged from 158 to 180 over a period of 15 minutes. No epistaxis, no diploplia, no HA.  Plan - continue diovan/hct - take the missed dose tonight and resume on schedule in the AM.           use home BP monitor: for SBP of 170+ sustained for 15-30 minutes take clonidine 0.1mg   and may repeat every 4 hours Her updated medication list for this problem includes:    Diovan Hct 160-12.5 Mg Tabs (Valsartan-hydrochlorothiazide) .Marland Kitchen... 1 by mouth once daily    Clonidine Hcl 0.1 Mg Tabs (Clonidine hcl) .Marland Kitchen... 1 by mouth q 4 as needed sbp greater than 170  Problem # 2:  PARESTHESIA, HANDS (ICD-782.0)  Patient's greatest concern is for CVA. Her symptoms started 2/8. She does have indiactions for carpal tunnel right wrist. She had carotid doppler March '11 with 0-39% disease bilateral ICA. MRI brain June without injury but with small vessel disease. Her neuro exam is normal except for tremor and paresthesia: good movement, strength, sharp - dull sensation and deep vibratory sensation. Discussed option of acute in-patient evaluation vs home management. She strongly prefers the later.  Plan - ASA 325mg  daily, first dose tonight           Urgent MRI brain w/ and w/o contrast 2/10           Carotid doppler follow-up           cockup wrist splint to right wrist.           call for progressive symptoms.  Orders: Radiology Referral (Radiology) Cardiology Referral  (Cardiology)  Complete Medication List: 1)  Diovan Hct 160-12.5 Mg Tabs (Valsartan-hydrochlorothiazide) .Marland Kitchen.. 1 by mouth once daily 2)  Multivitamins Tabs (Multiple vitamin) .Marland Kitchen.. 1 by mouth once daily 3)  Calcium-d 600-200 Mg-unit Tabs (Calcium carbonate-vitamin d) .... Take 3 tablet by mouth once a day 4)  Aleve 220 Mg Tabs (Naproxen sodium) .Marland Kitchen.. 1 two times a day as needed 5)  Zolpidem Tartrate 10 Mg Tabs (Zolpidem tartrate) .Marland Kitchen.. 1 by mouth at bedtime 6)  Welchol 625 Mg Tabs (Colesevelam hcl) .... Take 2 tablets by mouth every morning 7)  Voltaren 1 % Gel (Diclofenac sodium) .... Apply 4 g to affected knee 4 times a day 8)  Hydroxyzine Hcl 10 Mg Tabs (Hydroxyzine hcl) .Marland Kitchen.. 1 tab at bedtime as needed 9)  Lumigan 0.01 % Soln (Bimatoprost) .Marland Kitchen.. 1 drop both eyes daily 10)  Nitrofurantoin Macrocrystal 50 Mg Caps (Nitrofurantoin macrocrystal) .Marland Kitchen.. 1 by mouth once daily 11)  Symbicort 80-4.5 Mcg/act Aero (Budesonide-formoterol fumarate) .... 2 inhalations two times a day 12)  Analpram-hc 1-2.5 % Crea (Hydrocortisone ace-pramoxine) .... Apply to rectum three times a day as needed rectal bleeding 13)  Cipro 250 Mg Tabs (Ciprofloxacin hcl) .Marland Kitchen.. 1 by mouth two times a day x 7 14)  Doxycycline Hyclate  100 Mg Tabs (Doxycycline hyclate) .Marland Kitchen.. 1 by mouth two times a day x 7 for bronchitis 15)  Aspirin 325 Mg Tabs (Aspirin) .Marland Kitchen.. 1 by mouth once daily or 4 x 81 mg 16)  Clonidine Hcl 0.1 Mg Tabs (Clonidine hcl) .Marland Kitchen.. 1 by mouth q 4 as needed sbp greater than 170  Patient Instructions: 1)  tingling right hand - carpal tunnel vs small stroke. Last carotid doppler March '11 - 0-39% blockage, very low risk. Last MRI brain June '11 - hardening of he arteries. Plan - take aspirin 325mg  tonight and daily. continue all your medications. Will schedule MRI brain ASAP. Will schedule annual follow-up carotid doppler in a timely fashion. For the wrist pick up a cockup wrist splint at the pharmacy.  2)  Blood pressure - quite  variable. Can be drive by pain. Plan - take your regular meds (take diovan tonight). Check your BP if it stays greater than 170 over a period of 15-30 minutes take clonidine 0.1mg . This can be repeated every 4 hours.  Prescriptions: CLONIDINE HCL 0.1 MG TABS (CLONIDINE HCL) 1 by mouth q 4 as needed SBP greater than 170  #30 x 1   Entered and Authorized by:   [name] MD   Signed by:   [name] MD on 10/29/2010   Method used:   Telephoned to ...       [facility] [address] [phone]* (retail)       [address]       [place], [place]  [address]       Ph: [phone] or [phone]       Fax: [phone]   RxID:   [id number]    Orders Added: 1)  Radiology Referral [Radiology] 2)  Cardiology Referral [Cardiology] 3)  Est. Patient Level IV [40347]

## 2010-11-04 NOTE — Progress Notes (Signed)
  Phone Note Outgoing Call   Reason for Call: Discuss lab or test results Summary of Call: Mrs. Caster has been having increased pain and weakness and fatigue since her TKR exacerbated by a fall that has left her badly bruised at the coccyx and buttock. Lab today - Hgb 11.6, normal Bmet, normal WBC. Reviewed all her meds at discharge - she is not taking much. the percocet makes her too drowsy.  Recommend: APAP 1000mg  three times a day; Aleve two times a day  both on schedule. Use the robaxin for muscle spasm/cramp Initial call taken by: [name] MD,  October 26, 2010 6:18 PM

## 2010-11-04 NOTE — Progress Notes (Signed)
Summary: REQ A CALL FROM MD  Phone Note Call from Patient Call back at Home Phone [phone]   Summary of Call: Pt had knee surgery 3 wks ago. She fell "badly" and has been back to see Dr Merlyn Albert. She is very weak and "hardly sit up straight". She is req to talk to Dr Debby Bud.  Initial call taken by: Lamar Sprinkles, CMA,  October 25, 2010 5:16 PM  Follow-up for Phone Call        called patient: she fell Thursday and has severe bruising of the coccyx and buttock. She was seen by Dr. Lequita Halt today - knee is fine. She feels extremely weak.  Plan - CBC, Bmet in AM and possibly be seen Follow-up by: [name] MD,  October 25, 2010 6:28 PM

## 2010-11-04 NOTE — Progress Notes (Signed)
Summary: MRI ORDER  Phone Note From Other Clinic   Summary of Call: [facility] will not approve MRI w/and w/o contrast. They will approve w/o contrast study. Otherwise it will take 48 hours or more due to clinical review required by [facility].  Initial call taken by: Lamar Sprinkles, CMA,  October 29, 2010 9:48 AM  Follow-up for Phone Call        OK, but for the record a with and without study is usually recommended by our radilogist. Agree to a w/o constrast study so that we can get it done before Monday - the earliest we could complete a review and order.  Follow-up by: [name] MD,  October 29, 2010 12:51 PM

## 2010-11-04 NOTE — Progress Notes (Signed)
Summary: MED FOR MRI  Phone Note Call from Patient   Summary of Call: Pt is scheduled for closed MRI. She needs premeds to help w/anxiety.  Initial call taken by: Lamar Sprinkles, CMA,  October 29, 2010 1:48 PM    New/Updated Medications: VALIUM 5 MG TABS (DIAZEPAM) 1 30 min prior to MRI - repeat if needed Prescriptions: VALIUM 5 MG TABS (DIAZEPAM) 1 30 min prior to MRI - repeat if needed  #2 x 0   Entered by:   Lamar Sprinkles, CMA   Authorized by:   [name] MD   Signed by:   Lamar Sprinkles, CMA on 10/29/2010   Method used:   Telephoned to ...       [facility] (retail)       [address]       [place], [place]  [id number]       Ph: [phone] or [phone]       Fax: [phone]   RxID:   [id number]

## 2010-11-09 ENCOUNTER — Ambulatory Visit: Payer: Self-pay | Admitting: Cardiovascular Disease

## 2010-11-10 NOTE — Progress Notes (Signed)
Summary: Hand Numb?   Phone Note Call from Patient Call back at Home Phone [phone]   Caller: Patient-----[phone] Call For: Dr Debby Bud Summary of Call: Please call pt regarding hand that has been numb. Initial call taken by: Verdell Face,  November 01, 2010 3:37 PM  Follow-up for Phone Call        Pt's numbness was all the time starting wednesday. Saturday symptoms started to be only off and on. She wants to know if she should explore carpal tunnel as reason for this?  Follow-up by: Lamar Sprinkles, CMA,  November 01, 2010 4:13 PM  Additional Follow-up for Phone Call Additional follow up Details #1::        [place] it more time with the use of the wrist splint. If the symptoms do not continue to improve will get NCS/EMG Additional Follow-up by: [name] MD,  November 01, 2010 6:00 PM    Additional Follow-up for Phone Call Additional follow up Details #2::    VM full..................Marland KitchenLamar Sprinkles, CMA  November 01, 2010 6:20 PM   see new phone note Follow-up by: Lamar Sprinkles, CMA,  November 02, 2010 9:50 AM

## 2010-11-10 NOTE — Progress Notes (Signed)
Summary: HOSPITAL ADMIT  Phone Note Call from Patient   Summary of Call: Pt's husband called - pt c/o elevated BP, 5 lb wt loss & weakness. She would like to be admitted. OK per MD - 24 hr obs for refractory HTN & weakness at [facility] - tele bed.   Called bed control - hospital is full, they will call when bed is ready. Attempted to call pt to inform several times - left vm's, will call again later. Initial call taken by: Lamar Sprinkles, CMA,  November 02, 2010 10:51 AM  Follow-up for Phone Call        left mess to call office back, pt needs to be added to Dr Debby Bud schedule today 2pm or after....................Marland KitchenLamar Sprinkles, CMA  November 02, 2010 12:28 PM '  Spoke w/pt's husband. They called cardiology who said call ortho. Ortho advised continue tramadol, tylenol & aspirin. Force herself to eat and drink. Dr Debby Bud advised and will make house call this evening.  Follow-up by: Lamar Sprinkles, CMA,  November 02, 2010 12:55 PM

## 2010-11-16 ENCOUNTER — Ambulatory Visit: Payer: Medicare Other | Admitting: Internal Medicine

## 2010-11-16 ENCOUNTER — Other Ambulatory Visit: Payer: Medicare Other

## 2010-11-16 ENCOUNTER — Encounter: Payer: Self-pay | Admitting: Internal Medicine

## 2010-11-16 ENCOUNTER — Other Ambulatory Visit: Payer: Self-pay | Admitting: Internal Medicine

## 2010-11-16 DIAGNOSIS — R0609 Other forms of dyspnea: Secondary | ICD-10-CM

## 2010-11-16 DIAGNOSIS — Z79899 Other long term (current) drug therapy: Secondary | ICD-10-CM

## 2010-11-16 DIAGNOSIS — R5381 Other malaise: Secondary | ICD-10-CM

## 2010-11-16 DIAGNOSIS — D126 Benign neoplasm of colon, unspecified: Secondary | ICD-10-CM

## 2010-11-16 DIAGNOSIS — R5383 Other fatigue: Secondary | ICD-10-CM

## 2010-11-16 DIAGNOSIS — R0989 Other specified symptoms and signs involving the circulatory and respiratory systems: Secondary | ICD-10-CM

## 2010-11-16 LAB — CBC WITH DIFFERENTIAL/PLATELET
Basophils Absolute: 0 10*3/uL (ref 0.0–0.1)
Eosinophils Relative: 1.8 % (ref 0.0–5.0)
HCT: 39.3 % (ref 36.0–46.0)
Lymphs Abs: 1.5 10*3/uL (ref 0.7–4.0)
MCV: 90.5 fl (ref 78.0–100.0)
Monocytes Absolute: 0.4 10*3/uL (ref 0.1–1.0)
Platelets: 268 10*3/uL (ref 150.0–400.0)
RDW: 15.3 % — ABNORMAL HIGH (ref 11.5–14.6)

## 2010-11-16 LAB — TSH: TSH: 1.28 u[IU]/mL (ref 0.35–5.50)

## 2010-11-16 LAB — BASIC METABOLIC PANEL
GFR: 89.2 mL/min (ref >60.00)
Glucose, Bld: 79 mg/dL (ref 70–99)
Potassium: 5.1 mEq/L (ref 3.5–5.1)
Sodium: 136 mEq/L (ref 135–145)

## 2010-11-16 LAB — IBC PANEL: Iron: 106 ug/dL (ref 42–145)

## 2010-11-16 LAB — HEPATIC FUNCTION PANEL
AST: 18 U/L (ref 0–37)
Alkaline Phosphatase: 75 U/L (ref 39–117)
Bilirubin, Direct: 0 mg/dL (ref 0.0–0.3)
Total Bilirubin: 0.5 mg/dL (ref 0.3–1.2)

## 2010-11-22 ENCOUNTER — Encounter: Payer: Self-pay | Admitting: Internal Medicine

## 2010-11-22 ENCOUNTER — Observation Stay (HOSPITAL_COMMUNITY)
Admission: AD | Admit: 2010-11-22 | Discharge: 2010-11-23 | Disposition: A | Discharge status: Home / Self Care | Payer: Medicare Other | Source: Ambulatory Visit | Attending: Internal Medicine | Admitting: Internal Medicine

## 2010-11-22 ENCOUNTER — Ambulatory Visit (INDEPENDENT_AMBULATORY_CARE_PROVIDER_SITE_OTHER): Payer: Medicare Other | Admitting: Internal Medicine

## 2010-11-22 ENCOUNTER — Observation Stay (HOSPITAL_COMMUNITY): Payer: Medicare Other

## 2010-11-22 DIAGNOSIS — J984 Other disorders of lung: Secondary | ICD-10-CM | POA: Insufficient documentation

## 2010-11-22 DIAGNOSIS — R112 Nausea with vomiting, unspecified: Principal | ICD-10-CM | POA: Insufficient documentation

## 2010-11-22 DIAGNOSIS — Z96659 Presence of unspecified artificial knee joint: Secondary | ICD-10-CM | POA: Insufficient documentation

## 2010-11-22 DIAGNOSIS — R5381 Other malaise: Secondary | ICD-10-CM

## 2010-11-22 DIAGNOSIS — R0602 Shortness of breath: Secondary | ICD-10-CM | POA: Insufficient documentation

## 2010-11-22 DIAGNOSIS — R5383 Other fatigue: Secondary | ICD-10-CM

## 2010-11-22 DIAGNOSIS — R209 Unspecified disturbances of skin sensation: Secondary | ICD-10-CM | POA: Insufficient documentation

## 2010-11-22 DIAGNOSIS — Z79899 Other long term (current) drug therapy: Secondary | ICD-10-CM | POA: Insufficient documentation

## 2010-11-22 LAB — CARDIAC PANEL(CRET KIN+CKTOT+MB+TROPI)
CK, MB: 1.4 ng/mL (ref 0.3–4.0)
Relative Index: INVALID (ref 0.0–2.5)
Total CK: 60 U/L (ref 7–177)
Troponin I: 0.01 ng/mL (ref 0.00–0.06)

## 2010-11-22 MED ORDER — IOHEXOL 300 MG/ML  SOLN
100.0000 mL | Freq: Once | INTRAMUSCULAR | Status: AC | PRN
  Administered 2010-11-22: 100 mL via INTRAVENOUS

## 2010-11-23 DIAGNOSIS — R0602 Shortness of breath: Secondary | ICD-10-CM

## 2010-11-23 LAB — CARDIAC PANEL(CRET KIN+CKTOT+MB+TROPI)
CK, MB: 1.2 ng/mL (ref 0.3–4.0)
Relative Index: INVALID (ref 0.0–2.5)
Troponin I: 0.01 ng/mL (ref 0.00–0.06)
Troponin I: 0.01 ng/mL (ref 0.00–0.06)

## 2010-11-24 ENCOUNTER — Encounter: Payer: Self-pay | Admitting: Internal Medicine

## 2010-11-24 DIAGNOSIS — M79609 Pain in unspecified limb: Secondary | ICD-10-CM | POA: Insufficient documentation

## 2010-11-25 ENCOUNTER — Telehealth: Payer: Self-pay | Admitting: Internal Medicine

## 2010-11-25 NOTE — Assessment & Plan Note (Signed)
Summary: WENT TO REHAB/ TOO SICK TO STAY/NWS   Vital Signs:  Patient profile:   77 year old female Height:      64 inches Weight:      135 pounds BMI:     23.26 O2 Sat:      99 % on Room air Temp:     97.5 degrees F oral Pulse rate:   77 / minute BP sitting:   128 / 64  (left arm) Cuff size:   regular  Vitals Entered By: Bill Salinas CMA (November 16, 2010 9:27 AM)  O2 Flow:  Room air CC: pt here with c/o weakness, fatigue and loss of appetite/ with BP fluctuating/ ab   Primary Care Provider:  Illene Regulus, MD  CC:  pt here with c/o weakness and fatigue and loss of appetite/ with BP fluctuating/ ab.  History of Present Illness: Patinet presents c/o profound malaise. she has not been able to participate in rehab. she has not been a able to manage any of her normal activities. she does report increased frequency of stoolng but denies frank diarrhea. She has no focal complaints or pain. Her knee is doing better with good healing. She does continue to take tramadol and has started on macrobid. She denies fevers, sweats, abdominal pain, respiratory distress. Al medications reviewed: she does continue to take zolpidem and hydroxyzine both of which may cause drowsiness.  Current Medications (verified): 1)  Diovan Hct 160-12.5 Mg Tabs (Valsartan-Hydrochlorothiazide) .Marland Kitchen.. 1 By Mouth Once Daily 2)  Multivitamins   Tabs (Multiple Vitamin) .Marland Kitchen.. 1 By Mouth Once Daily 3)  Calcium-D 600-200 Mg-Unit Tabs (Calcium Carbonate-Vitamin D) .... Take 3 Tablet By Mouth Once A Day 4)  Aleve 220 Mg Tabs (Naproxen Sodium) .Marland Kitchen.. 1 Two Times A Day As Needed 5)  Zolpidem Tartrate 10 Mg Tabs (Zolpidem Tartrate) .Marland Kitchen.. 1 By Mouth At Bedtime 6)  Welchol 625 Mg Tabs (Colesevelam Hcl) .... Take 2 Tablets By Mouth Every Morning 7)  Voltaren 1 % Gel (Diclofenac Sodium) .... Apply 4 G To Affected Knee 4 Times A Day 8)  Hydroxyzine Hcl 10 Mg Tabs (Hydroxyzine Hcl) .Marland Kitchen.. 1 Tab At Bedtime As Needed 9)  Lumigan 0.01 % Soln  (Bimatoprost) .Marland Kitchen.. 1 Drop Both Eyes Daily 10)  Nitrofurantoin Macrocrystal 50 Mg Caps (Nitrofurantoin Macrocrystal) .Marland Kitchen.. 1 By Mouth Once Daily 11)  Symbicort 80-4.5 Mcg/act Aero (Budesonide-Formoterol Fumarate) .... 2 Inhalations Two Times A Day 12)  Analpram-Hc 1-2.5 % Crea (Hydrocortisone Ace-Pramoxine) .... Apply To Rectum Three Times A Day As Needed Rectal Bleeding 13)  Cipro 250 Mg Tabs (Ciprofloxacin Hcl) .Marland Kitchen.. 1 By Mouth Two Times A Day X 7 14)  Doxycycline Hyclate 100 Mg Tabs (Doxycycline Hyclate) .Marland Kitchen.. 1 By Mouth Two Times A Day X 7 For Bronchitis 15)  Aspirin 325 Mg Tabs (Aspirin) .Marland Kitchen.. 1 By Mouth Once Daily or 4 X 81 Mg 16)  Clonidine Hcl 0.1 Mg Tabs (Clonidine Hcl) .Marland Kitchen.. 1 By Mouth Q 4 As Needed Sbp Greater Than 170 17)  Valium 5 Mg Tabs (Diazepam) .Marland Kitchen.. 1 30 Min Prior To Mri - Repeat If Needed 18)  Tylenol Extra Strength 500 Mg Tabs (Acetaminophen) .Marland Kitchen.. 1 Tablet Two Times A Day  Allergies (verified): 1)  ! Penicillin 2)  ! Sulfa 3)  Amoxicillin (Amoxicillin)  Past History:  Past Medical History: Last updated: 12/10/2008 PLEURAL EFFUSION, LEFT (ICD-511.9) COLLES' FRACTURE, LEFT (ICD-813.41) COPD (ICD-496) ANXIETY STATE, UNSPECIFIED (ICD-300.00) BACK PAIN, THORACIC REGION, RIGHT (ICD-724.1) CHEST PAIN UNSPECIFIED (ICD-786.50) PRURITUS (ICD-698.9) DIASTOLIC  DYSFUNCTION (ICD-429.9) DYSPNEA ON EXERTION (ICD-786.09) PULMONARY NODULE (ICD-518.89) ASTHMA (ICD-493.90) UPPER RESPIRATORY INFECTION (ICD-465) IRRITABLE BOWEL SYNDROME (ICD-564.1) CRAMP OF LIMB (ICD-729.82) ALLERGIC RHINITIS, CHRONIC (ICD-477.9) BRONCHIECTASIS (ICD-494.0) INSOMNIA UNSPECIFIED (ICD-780.52) HX, PNEUMONIA (ICD-V12.61) DIVERTICULOSIS,  HX OF (ICD-V12.79) Hx of POLYP, COLON (ICD-211.3) HYPERTENSION (ICD-401.9) #DYSPNEA - since Jan 2009 folowing 'pneumonia' v 20 yr history..................................Marland KitchenRamaswamy -> Suspect due to COPD (See below) with BD response: ON symbicort since 12/2007. Refused  spiriva. -> hx of remote smoking 12 pack year. Quit 1960. ? under-reporting history -> Diastolic dysfunction with LVH 4/78 2 D echo but normal BNP and D-dimer 4.8.2009 -> Low Prob VQ scan 12/19/2007 -> Dextro convex scoliois on Bone Scan 01/05/2008 -> PFT 12/05/2007: obstructive spirometry FEv1 1.05L/59% with 11% BD response and low dlco, restricted lung volume due to scoliosis -> Spiro 10/21/2008: Fev1 0.92L/50%, FVC 1.6L/60%, Ratio 59 (78) -> CT Chest 10/05/2007 -> 10/16/2007: hyperinflation and stable micronodules  -> Hgb 14.2gm% on 01/24/2008 #PULMONARY MICRONODULES.......................................................Marland KitchenRamaswamy -> Stable on ct chest 10/05/2007 to 10/15/2008 -> No further followup Multiple Rib fractures-bilaterally 2/10  Physician Roster:      [facility] Bibles - Jarold Motto      GI- Marina Goodell ( will not see Jarold Motto)      Card - Brackbill      Ortho- Gioffre      Pulm - M Ramaswamy      Hand Surgery - Mina Marble  Past Surgical History: TONSILLECTOMY, HX OF (ICD-V45.79) APPENDECTOMY, HX OF (ICD-V45.79) CHOLECYSTECTOMY, HX OF (ICD-V45.79) POLYPECTOMY, HX OF (ICD-V15.9) A-P bladder suspension '05 Rotator cuff repair'06  ORIF left wrist '10 [facility]) TKR right - December '11  Review of Systems  The patient denies anorexia, fever, weight loss, weight gain, hoarseness, chest pain, headaches, abdominal pain, hematochezia, incontinence, muscle weakness, difficulty walking, abnormal bleeding, and enlarged lymph nodes.    Physical Exam  General:  alert, well-developed, well-nourished, well-hydrated, appropriate dress, and normal appearance.   Head:  normocephalic and atraumatic.   Eyes:  vision grossly intact, pupils equal, pupils round, corneas and lenses clear, and no injection.   Neck:  supple and full ROM.   Lungs:  normal respiratory effort and normal breath sounds.   Heart:  normal rate and regular rhythm.   Abdomen:  soft and normal bowel sounds.   Msk:  right knee with well healed  incision, no erythema, swelling, heat or tenderness to palpation Pulses:  2+ radial Neurologic:  alert & oriented X3, cranial nerves II-XII intact, and gait normal.   Skin:  turgor normal and color normal.   Cervical Nodes:  no anterior cervical adenopathy and no posterior cervical adenopathy.   Psych:  Oriented X3, memory intact for recent and remote, normally interactive, and good eye contact.     Impression & Recommendations:  Problem # 1:  MALAISE AND FATIGUE (ICD-780.79) Patient very short on energy and has not been able to do her usual activities. Physical exam is not revealing.  Plan - lab to r/o metabolic and hematologic cause of malaise           stop all sedating meds: tramadol, zolpidem, hydroxyzine.  Orders: TLB-B12 + Folate Pnl (29562_13086-V78/ION) TLB-IBC Pnl (Iron/FE;Transferrin) (83550-IBC) TLB-BMP (Basic Metabolic Panel-BMET) (80048-METABOL) TLB-Hepatic/Liver Function Pnl (80076-HEPATIC) TLB-CBC Platelet - w/Differential (85025-CBCD) TLB-TSH (Thyroid Stimulating Hormone) (84443-TSH) TLB-BNP (B-Natriuretic Peptide) (83880-BNPR) TLB-CRP-High Sensitivity (C-Reactive Protein) (86140-FCRP)  Addendum- all lab are in normal range. Reported to patient. She reports that she is having a lot of knee pain but will stay of meds that were thought to be  sedating.  Complete Medication List: 1)  Diovan Hct 160-12.5 Mg Tabs (Valsartan-hydrochlorothiazide) .Marland Kitchen.. 1 by mouth once daily 2)  Multivitamins Tabs (Multiple vitamin) .Marland Kitchen.. 1 by mouth once daily 3)  Calcium-d 600-200 Mg-unit Tabs (Calcium carbonate-vitamin d) .... Take 3 tablet by mouth once a day 4)  Aleve 220 Mg Tabs (Naproxen sodium) .Marland Kitchen.. 1 two times a day as needed 5)  Welchol 625 Mg Tabs (Colesevelam hcl) .... Take 2 tablets by mouth every morning 6)  Voltaren 1 % Gel (Diclofenac sodium) .... Apply 4 g to affected knee 4 times a day 7)  Lumigan 0.01 % Soln (Bimatoprost) .Marland Kitchen.. 1 drop both eyes daily 8)  Nitrofurantoin  Macrocrystal 50 Mg Caps (Nitrofurantoin macrocrystal) .Marland Kitchen.. 1 by mouth once daily 9)  Symbicort 80-4.5 Mcg/act Aero (Budesonide-formoterol fumarate) .... 2 inhalations two times a day 10)  Analpram-hc 1-2.5 % Crea (Hydrocortisone ace-pramoxine) .... Apply to rectum three times a day as needed rectal bleeding 11)  Aspirin 325 Mg Tabs (Aspirin) .Marland Kitchen.. 1 by mouth once daily or 4 x 81 mg 12)  Clonidine Hcl 0.1 Mg Tabs (Clonidine hcl) .Marland Kitchen.. 1 by mouth q 4 as needed sbp greater than 170 13)  Tylenol Extra Strength 500 Mg Tabs (Acetaminophen) .Marland Kitchen.. 1 tablet two times a day   Patient: Lori [name] Note: All result statuses are Final unless otherwise noted.  Tests: (1) B12 + Folate Panel (B12/FOL)   Vitamin B12               746 pg/mL                   211-911   Folate                    >24.8 ng/mL                 >5.9  Tests: (2) IBC Panel (IBC)   Iron                      106 ug/dL                   16-109   Transferrin               242.3 mg/dL                 604.5-409.8   Iron Saturation           31.2 %                      20.0-50.0  Tests: (3) BMP (METABOL)   Sodium                    136 mEq/L                   135-145   Potassium                 5.1 mEq/L                   3.5-5.1   Chloride                  100 mEq/L                   96-112   Carbon Dioxide            29 mEq/L  19-32   Glucose                   79 mg/dL                    16-10   BUN                       16 mg/dL                    9-60   Creatinine                0.7 mg/dL                   4.5-4.0   Calcium                   9.2 mg/dL                   9.8-11.9   GFR                       89.20 mL/min                >60.00  Tests: (4) Hepatic/Liver Function Panel (HEPATIC)   Total Bilirubin           0.5 mg/dL                   1.4-7.8   Direct Bilirubin          0.0 mg/dL                   2.9-5.6   Alkaline Phosphatase      75 U/L                      39-117   AST                       18 U/L                       0-37   ALT                       19 U/L                      0-35   Total Protein             6.6 g/dL                    2.1-3.0   Albumin                   4.1 g/dL                    8.6-5.7  Tests: (5) CBC Platelet w/Diff (CBCD)   White Cell Count          7.6 K/uL                    4.5-10.5   Red Cell Count            4.34 Mil/uL                 3.87-5.11   Hemoglobin  13.2 g/dL                   30.8-65.7   Hematocrit                39.3 %                      36.0-46.0   MCV                       90.5 fl                     78.0-100.0   MCHC                      33.6 g/dL                   84.6-96.2   RDW                  [H]  15.3 %                      11.5-14.6   Platelet Count            268.0 K/uL                  150.0-400.0   Neutrophil %              72.4 %                      43.0-77.0   Lymphocyte %              20.0 %                      12.0-46.0   Monocyte %                5.5 %                       3.0-12.0   Eosinophils%              1.8 %                       0.0-5.0   Basophils %               0.3 %                       0.0-3.0   Neutrophill Absolute      5.5 K/uL                    1.4-7.7   Lymphocyte Absolute       1.5 K/uL                    0.7-4.0   Monocyte Absolute         0.4 K/uL                    0.1-1.0  Eosinophils, Absolute                             0.1 K/uL                    0.0-0.7   Basophils  Absolute        0.0 K/uL                    0.0-0.1  Tests: (6) TSH (TSH)   FastTSH                   1.28 uIU/mL                 0.35-5.50  Tests: (7) B-Type Natiuretic Peptide (BNPR)  B-Type Natriuetic Peptide                             40.7 pg/mL                  0.0-100.0  Tests: (8) Full Range CRP (FCRP)   CRPH                      3.08 mg/L                   0.00-5.00  Patient Instructions: 1)  Malaise and fatigue - will stop all sedating drugs: hydroxyzine, zolpidem, tramadol. Will check lab for  underlying metabolic derangement or anemia. I will let you know the lab results. If all the labs are normal we need to consider post-operative depression and can consider using a drug called Wellbutrin.    Orders Added: 1)  TLB-B12 + Folate Pnl [[id number]-B12/FOL] 2)  TLB-IBC Pnl (Iron/FE;Transferrin) [83550-IBC] 3)  TLB-BMP (Basic Metabolic Panel-BMET) [80048-METABOL] 4)  TLB-Hepatic/Liver Function Pnl [80076-HEPATIC] 5)  TLB-CBC Platelet - w/Differential [85025-CBCD] 6)  TLB-TSH (Thyroid Stimulating Hormone) [84443-TSH] 7)  TLB-BNP (B-Natriuretic Peptide) [83880-BNPR] 8)  TLB-CRP-High Sensitivity (C-Reactive Protein) [86140-FCRP]

## 2010-11-30 NOTE — Progress Notes (Signed)
Summary: RELEASE NOTE  Phone Note From Other Clinic   Summary of Call: Tricia from PT at ortho's office called. They need written rx from MD stating that pt is clear for surgery and any restrictions (bp etc) Fax note [phone] Initial call taken by: Lamar Sprinkles, CMA,  November 25, 2010 12:49 PM  Follow-up for Phone Call        what type of surgery? Will she get general anesthesia? Follow-up by: [name] MD,  November 25, 2010 1:50 PM  Additional Follow-up for Phone Call Additional follow up Details #1::        I am sorry, let me correct the original statement. Clear  to resume physical therapy? Any restrictions? They will need written note faxed.  Additional Follow-up by: Lamar Sprinkles, CMA,  November 25, 2010 2:06 PM    Additional Follow-up for Phone Call Additional follow up Details #2::    k. note on triage cart Follow-up by: [name] MD,  November 25, 2010 6:18 PM  Additional Follow-up for Phone Call Additional follow up Details #3:: Details for Additional Follow-up Action Taken: To be faxed tomorrow Additional Follow-up by: Lamar Sprinkles, CMA,  November 25, 2010 6:23 PM

## 2010-11-30 NOTE — Miscellaneous (Signed)
Summary: Orders Update   Clinical Lists Changes  Problems: Added new problem of LEG PAIN, BILATERAL (ICD-729.5) Orders: Added new Referral order of LE Arterial Doppler/ABI (Le arterial doppler) - Signed

## 2010-11-30 NOTE — Initial Assessments (Signed)
Vital Signs:  Patient profile:   77 year old female Height:      64 inches Weight:      135 pounds BMI:     23.26 O2 Sat:      97 % on Room air Temp:     97.5 degrees F oral Pulse rate:   79 / minute BP sitting:   150 / 74  (left arm) Cuff size:   regular  Vitals Entered By: Bill Salinas CMA (November 22, 2010 9:45 AM)  O2 Flow:  Room air CC: ov for evaluation of BP, Pt states she did have headache this am/ AB    Primary Care Provider:  Illene Regulus, MD  CC:  ov for evaluation of BP and Pt states she did have headache this am/ AB.  History of Present Illness: Lori [name] returns for profound weakness and malaise. She was seen recently for this and had multiple labs all of which came back in normal range. (see attached). She was on several sedating medications all of which were stopped. She is s/p recent right TKR done under spinal anesthesia. Her reocvery has been going well and she is ambulatory and the knee looks good and her orthopedic surgeon is pleased.  She has underlying restrictive lung disease with last PFTs Feb '10 revealing severe restrictive disease - however she does report that she is more short of breath than ever. She has no cough and no signs of active infecton.  She has had 2D echo last done Feb '09 read out as a normal study. She has had hard palpitations but no chest pain. She does feel general malaise and weakness and definitely has reduced exercise tolerance.  she has never had a problem with claudication but is now having cold legs along with a pins and needle paresthesia.  She has very weak pulses.   At this point she is for 24 hour admission for 2D echo, CT chest with PE protocol, cycle cardiac enzymes, and lower extremity arterial doppler with further evaluation to be based on findings.   Current Medications (verified): 1)  Diovan Hct 160-12.5 Mg Tabs (Valsartan-Hydrochlorothiazide) .Marland Kitchen.. 1 By Mouth Once Daily 2)  Multivitamins   Tabs (Multiple Vitamin)  .Marland Kitchen.. 1 By Mouth Once Daily 3)  Calcium-D 600-200 Mg-Unit Tabs (Calcium Carbonate-Vitamin D) .... Take 3 Tablet By Mouth Once A Day 4)  Aleve 220 Mg Tabs (Naproxen Sodium) .Marland Kitchen.. 1 Two Times A Day As Needed 5)  Welchol 625 Mg Tabs (Colesevelam Hcl) .... Take 2 Tablets By Mouth Every Morning 6)  Voltaren 1 % Gel (Diclofenac Sodium) .... Apply 4 G To Affected Knee 4 Times A Day 7)  Lumigan 0.01 % Soln (Bimatoprost) .Marland Kitchen.. 1 Drop Both Eyes Daily 8)  Nitrofurantoin Macrocrystal 50 Mg Caps (Nitrofurantoin Macrocrystal) .Marland Kitchen.. 1 By Mouth Once Daily 9)  Symbicort 80-4.5 Mcg/act Aero (Budesonide-Formoterol Fumarate) .... 2 Inhalations Two Times A Day 10)  Analpram-Hc 1-2.5 % Crea (Hydrocortisone Ace-Pramoxine) .... Apply To Rectum Three Times A Day As Needed Rectal Bleeding 11)  Aspirin 325 Mg Tabs (Aspirin) .Marland Kitchen.. 1 By Mouth Once Daily or 4 X 81 Mg 12)  Clonidine Hcl 0.1 Mg Tabs (Clonidine Hcl) .Marland Kitchen.. 1 By Mouth Q 4 As Needed Sbp Greater Than 170 13)  Tylenol Extra Strength 500 Mg Tabs (Acetaminophen) .Marland Kitchen.. 1 Tablet Two Times A Day  Allergies (verified): 1)  ! Penicillin 2)  ! Sulfa 3)  Amoxicillin (Amoxicillin)  Past History:  Past Medical History: Last updated:  12/10/2008 PLEURAL EFFUSION, LEFT (ICD-511.9) COLLES' FRACTURE, LEFT (ICD-813.41) COPD (ICD-496) ANXIETY STATE, UNSPECIFIED (ICD-300.00) BACK PAIN, THORACIC REGION, RIGHT (ICD-724.1) CHEST PAIN UNSPECIFIED (ICD-786.50) PRURITUS (ICD-698.9) DIASTOLIC DYSFUNCTION (ICD-429.9) DYSPNEA ON EXERTION (ICD-786.09) PULMONARY NODULE (ICD-518.89) ASTHMA (ICD-493.90) UPPER RESPIRATORY INFECTION (ICD-465) IRRITABLE BOWEL SYNDROME (ICD-564.1) CRAMP OF LIMB (ICD-729.82) ALLERGIC RHINITIS, CHRONIC (ICD-477.9) BRONCHIECTASIS (ICD-494.0) INSOMNIA UNSPECIFIED (ICD-780.52) HX, PNEUMONIA (ICD-V12.61) DIVERTICULOSIS,  HX OF (ICD-V12.79) Hx of POLYP, COLON (ICD-211.3) HYPERTENSION (ICD-401.9) #DYSPNEA - since Jan 2009 folowing 'pneumonia' v 20 yr  history..................................Marland KitchenRamaswamy -> Suspect due to COPD (See below) with BD response: ON symbicort since 12/2007. Refused spiriva. -> hx of remote smoking 12 pack year. Quit 1960. ? under-reporting history -> Diastolic dysfunction with LVH 4/09 2 D echo but normal BNP and D-dimer 4.8.2009 -> Low Prob VQ scan 12/19/2007 -> Dextro convex scoliois on Bone Scan 01/05/2008 -> PFT 12/05/2007: obstructive spirometry FEv1 1.05L/59% with 11% BD response and low dlco, restricted lung volume due to scoliosis -> Spiro 10/21/2008: Fev1 0.92L/50%, FVC 1.6L/60%, Ratio 59 (78) -> CT Chest 10/05/2007 -> 10/16/2007: hyperinflation and stable micronodules  -> Hgb 14.2gm% on 01/24/2008 #PULMONARY MICRONODULES.......................................................Marland KitchenRamaswamy -> Stable on ct chest 10/05/2007 to 10/15/2008 -> No further followup Multiple Rib fractures-bilaterally 2/10  Physician Roster:      Gyn - Jarold Motto      GI- Marina Goodell ( will not see Jarold Motto)      Card - Brackbill      Ortho- Gioffre      Pulm - M Ramaswamy      Hand Surgery - [facility]  Past Surgical History: Last updated: 11/16/2010 TONSILLECTOMY, HX OF (ICD-V45.79) APPENDECTOMY, HX OF (ICD-V45.79) CHOLECYSTECTOMY, HX OF (ICD-V45.79) POLYPECTOMY, HX OF (ICD-V15.9) A-P bladder suspension '05 Rotator cuff repair'06  ORIF left wrist '10 Mina Marble) TKR right - December '11  Family History: Last updated: 08/10/2009 No FH of Colon Cancer: Family History of Heart Disease: Mother  Social History: Last updated: 07/23/2010 College - [employer], [employer]. [place]-[employer] art married 1959- souse s/p valve surgery with resulting paralysis hemidiaphragm ('10) 3 daughters, 5 grandsons, 4 granddaughters. one daughter bipolar-social issues Remains very active, but can't play tennis Patient was apparently an ex-smoker, however, husband does not recollect patient smoking. Stressful move to smaller quarters (Fall '09)  Review of Systems        The patient complains of weight loss, dyspnea on exertion, muscle weakness, and difficulty walking.  The patient denies anorexia, fever, weight gain, vision loss, decreased hearing, hoarseness, chest pain, syncope, peripheral edema, prolonged cough, headaches, abdominal pain, severe indigestion/heartburn, suspicious skin lesions, transient blindness, depression, abnormal bleeding, and angioedema.         hard heart beats  Physical Exam  General:  WNWD well groomed white woman who is not in acute distress but is frustrated, if not distraught, over her persistent weakness and symtpoms Head:  normocephalic, atraumatic, and no abnormalities observed.   Eyes:  vision grossly intact, pupils equal, pupils round, and corneas and lenses clear.   Mouth:  no oral lesions Neck:  supple, full ROM, no thyromegaly, and no carotid bruits.   Chest Wall:  no deformities.   Lungs:  normal respiratory effort, normal breath sounds, no crackles, and no wheezes.   Heart:  normal rate, regular rhythm, no JVD, and no HJR.   Abdomen:  soft, non-tender, normal bowel sounds, no masses, no guarding, and no abdominal hernia.   Msk:  normal ROM, no joint tenderness, no joint warmth, no joint deformities, and no joint instability.   Pulses:  2+ radial pulse; trace to 1+ DP pulse right, trace DP pulse left Extremities:  Kknee with well healed surgical scar. NO deformities noted Neurologic:  alert & oriented X3, cranial nerves II-XII intact, and gait normal.  No focal findings.  Skin:  turgor normal, color normal, no rashes, no ecchymoses, no purpura, and no ulcerations.   Cervical Nodes:  no anterior cervical adenopathy and no posterior cervical adenopathy.   Axillary Nodes:  no R axillary adenopathy and no L axillary adenopathy.   Psych:  Oriented X3, memory intact for recent and remote, normally interactive, and not anxious appearing.     Impression & Recommendations:  Problem # 1:  MALAISE AND FATIGUE  (ICD-780.79)  Patient with persistent weakness and continued loss of ability to function. Her labs are negative and she does not any eveidence of metabolic derangement. Concern now for evaluation respiratory change, r/o PE or any underlying parenchymal change; evaluate cardiac function with 2D echo; evaluation of possi ble cluadication with lower extremity Dopplers. She didhave carotid dopplers in April '09.  Plan - 24 hour admit to telemetry           cycle cardiac enzyme           2D echo -(brackbill)           LE art doppler           CT chest PE protocol  Orders: No Charge Patient Arrived (NCPA0) (NCPA0)  Complete Medication List: 1)  Diovan Hct 160-12.5 Mg Tabs (Valsartan-hydrochlorothiazide) .Marland Kitchen.. 1 by mouth once daily 2)  Multivitamins Tabs (Multiple vitamin) .Marland Kitchen.. 1 by mouth once daily 3)  Calcium-d 600-200 Mg-unit Tabs (Calcium carbonate-vitamin d) .... Take 3 tablet by mouth once a day 4)  Aleve 220 Mg Tabs (Naproxen sodium) .Marland Kitchen.. 1 two times a day as needed 5)  Welchol 625 Mg Tabs (Colesevelam hcl) .... Take 2 tablets by mouth every morning 6)  Voltaren 1 % Gel (Diclofenac sodium) .... Apply 4 g to affected knee 4 times a day 7)  Lumigan 0.01 % Soln (Bimatoprost) .Marland Kitchen.. 1 drop both eyes daily 8)  Nitrofurantoin Macrocrystal 50 Mg Caps (Nitrofurantoin macrocrystal) .Marland Kitchen.. 1 by mouth once daily 9)  Symbicort 80-4.5 Mcg/act Aero (Budesonide-formoterol fumarate) .... 2 inhalations two times a day 10)  Analpram-hc 1-2.5 % Crea (Hydrocortisone ace-pramoxine) .... Apply to rectum three times a day as needed rectal bleeding 11)  Aspirin 325 Mg Tabs (Aspirin) .Marland Kitchen.. 1 by mouth once daily or 4 x 81 mg 12)  Clonidine Hcl 0.1 Mg Tabs (Clonidine hcl) .Marland Kitchen.. 1 by mouth q 4 as needed sbp greater than 170 13)  Tylenol Extra Strength 500 Mg Tabs (Acetaminophen) .Marland Kitchen.. 1 tablet two times a day  Patient: Lori [name] Note: All result statuses are Final unless otherwise noted.  Tests: (1) B12 + Folate Panel  (B12/FOL)   Vitamin B12               746 pg/mL                   211-911   Folate                    >24.8 ng/mL                 >5.9  Tests: (2) IBC Panel (IBC)   Iron                      106 ug/dL  42-145   Transferrin               242.3 mg/dL                 045.4-098.1   Iron Saturation           31.2 %                      20.0-50.0  Tests: (3) BMP (METABOL)   Sodium                    136 mEq/L                   135-145   Potassium                 5.1 mEq/L                   3.5-5.1   Chloride                  100 mEq/L                   96-112   Carbon Dioxide            29 mEq/L                    19-32   Glucose                   79 mg/dL                    19-14   BUN                       16 mg/dL                    7-82   Creatinine                0.7 mg/dL                   9.5-6.2   Calcium                   9.2 mg/dL                   1.3-08.6   GFR                       89.20 mL/min                >60.00  Tests: (4) Hepatic/Liver Function Panel (HEPATIC)   Total Bilirubin           0.5 mg/dL                   5.7-8.4   Direct Bilirubin          0.0 mg/dL                   6.9-6.2   Alkaline Phosphatase      75 U/L                      39-117   AST                       18 U/L  0-37   ALT                       19 U/L                      0-35   Total Protein             6.6 g/dL                    1.6-1.0   Albumin                   4.1 g/dL                    9.6-0.4  Tests: (5) CBC Platelet w/Diff (CBCD)   White Cell Count          7.6 K/uL                    4.5-10.5   Red Cell Count            4.34 Mil/uL                 3.87-5.11   Hemoglobin                13.2 g/dL                   54.0-98.1   Hematocrit                39.3 %                      36.0-46.0   MCV                       90.5 fl                     78.0-100.0   MCHC                      33.6 g/dL                   19.1-47.8   RDW                  [H]  15.3  %                      11.5-14.6   Platelet Count            268.0 K/uL                  150.0-400.0   Neutrophil %              72.4 %                      43.0-77.0   Lymphocyte %              20.0 %                      12.0-46.0   Monocyte %                5.5 %                       3.0-12.0   Eosinophils%  1.8 %                       0.0-5.0   Basophils %               0.3 %                       0.0-3.0   Neutrophill Absolute      5.5 K/uL                    1.4-7.7   Lymphocyte Absolute       1.5 K/uL                    0.7-4.0   Monocyte Absolute         0.4 K/uL                    0.1-1.0  Eosinophils, Absolute                             0.1 K/uL                    0.0-0.7   Basophils Absolute        0.0 K/uL                    0.0-0.1  Tests: (6) TSH (TSH)   FastTSH                   1.28 uIU/mL                 0.35-5.50  Tests: (7) B-Type Natiuretic Peptide (BNPR)  B-Type Natriuetic Peptide                             40.7 pg/mL                  0.0-100.0  Tests: (8) Full Range CRP (FCRP)   CRPH                      3.08 mg/L                   0.00-5.00  Orders Added: 1)  No Charge Patient Arrived (NCPA0) [NCPA0]

## 2010-12-01 ENCOUNTER — Other Ambulatory Visit: Payer: Self-pay | Admitting: Internal Medicine

## 2010-12-01 DIAGNOSIS — Z1231 Encounter for screening mammogram for malignant neoplasm of breast: Secondary | ICD-10-CM

## 2010-12-02 ENCOUNTER — Encounter: Payer: Self-pay | Admitting: Internal Medicine

## 2010-12-03 ENCOUNTER — Encounter (INDEPENDENT_AMBULATORY_CARE_PROVIDER_SITE_OTHER): Payer: MEDICARE

## 2010-12-03 DIAGNOSIS — I739 Peripheral vascular disease, unspecified: Secondary | ICD-10-CM

## 2010-12-03 NOTE — Discharge Summary (Signed)
NAME:  Lori [name], Lori [name] NO.:  [id number]  MEDICAL RECORD NO.:  [id number]           PATIENT TYPE:  I  LOCATION:  1422                         FACILITY:  [facility]  PHYSICIAN:  Rosalyn Gess. Norins, MD  DATE OF BIRTH:  03/16/1925  DATE OF ADMISSION:  11/22/2010 DATE OF DISCHARGE:  11/23/2010                              DISCHARGE SUMMARY   Time of admission at 1845 hours, time of discharge on 1800 hours.  ADMITTING DIAGNOSES:  Malaise and fatigue.  DISCHARGE DIAGNOSIS:  Malaise and fatigue.  CONSULTANTS:  None.  PROCEDURES: 1. CT angio of the chest which was read out as no sign of pulmonary     embolism, few scattered nodules within the lung, stable since prior     study, compatible with benign nodules.  No confluent opacities or     effusions. 2. A 2-D echo which was read out as showing normal left ventricular     function with an ejection fraction of 55% to 60% with no wall     motion abnormalities.  HISTORY OF PRESENT ILLNESS:  [name] has been seen in the office for profound weakness and malaise.  She was recently seen for this problem and had full laboratory evaluation which came back as unremarkable with B12 of 746, folate greater than 24.  Iron studies were normal with iron saturation of 31.2% and iron level of 106 mcg/dL.  Metabolic panel was normal.  Liver functions were normal.  CBC with a white count 7600, hemoglobin 13.2 g, platelet count 268000, normal differential.  TSH was normal at 1.28.  BNP was normal at 40.7.  CRP was normal at 3.08.  The patient had been having increasing problem with weakness and shortness of breath with exertion since total knee replacement performed under spinal anesthesia.  She has done very well from an orthopedic perspective.  The patient does have underlying restrictive lung disease with last PFTs being February of 2010 revealing severe restrictive disease.  The patient also had 2-D echo on 2009 that was  normal.  The patient has never had claudication but is now having problems with cold legs with pins and needle paresthesia and on physical exam very weak pulses.  The patient was admitted for acute evaluation, rule out PE, acute pulmonary changes or cardiomyopathy.  Please see the EMR generated admission note for past medical history, family history, social history and physical exam at admission.  HOSPITAL COURSE:  The patient was admitted to a telemetry unit and on telemetry she had normal sinus rhythm.  The patient did have orthostatic vital signs checked which showed no significant orthostatic drop with a supine blood pressure of 129/63, sitting 116/74, standing 112/70. Cardiac enzymes were cycled x3 and were negative.  A 2-D echo was done and reported above.  CT angio was done and reported above.  Lower extremity arterial Doppler was ordered but not done by time of her 24- hour eval lapse and will be scheduled as an outpatient.  The patient does report that she is feeling better and is ready to return to home.  DISCHARGE EXAMINATION:  VITAL SIGNS:  Temperature was 98, blood pressure 100/60, pulse 75, respirations 18, O2 sats 97% on room air. GENERAL APPEARANCE:  The patient is sitting in chair, in no distress. HEENT:  Exam was grossly unremarkable. PULMONARY:  No increased work of breathing.  No wheezing. CARDIOVASCULAR:  Normal sinus rhythm on telemetry.  No further examination conducted.  DISPOSITION:  The patient is discharged to home.  I will set her up for lower extremity arterial Dopplers as an outpatient.  We will request that she have an appointment with Dr. Marchelle Gearing for followup of her pulmonary function and consideration for repeat PFTs.  Further evaluation in regards to lower extremity circulation and weakness will be based upon results of lower extremity arterial Doppler. The patient's condition at time of discharge dictation is stable and improved from time of  admission.     Rosalyn Gess Norins, MD     MEN/MEDQ  D:  11/23/2010  T:  11/23/2010  Job:  [id number]  cc:   Candice Camp, M.D.  Electronically Signed by Illene Regulus MD on 12/03/2010 05:17:16 PM

## 2010-12-07 NOTE — Miscellaneous (Signed)
Summary: Orders Update  Clinical Lists Changes  Orders: Added new Test order of Arterial Duplex Lower Extremity (Arterial Duplex Low) - Signed 

## 2010-12-14 ENCOUNTER — Ambulatory Visit (HOSPITAL_COMMUNITY): Payer: MEDICARE | Attending: Internal Medicine

## 2010-12-16 ENCOUNTER — Encounter: Payer: Self-pay | Admitting: Internal Medicine

## 2010-12-17 ENCOUNTER — Telehealth: Payer: Self-pay | Admitting: *Deleted

## 2010-12-17 ENCOUNTER — Encounter: Payer: Self-pay | Admitting: Internal Medicine

## 2010-12-17 NOTE — Telephone Encounter (Signed)
NO pe

## 2010-12-17 NOTE — Telephone Encounter (Signed)
Pt's husband left vm - Pt needs physical report faxed to [facility] where they will be moving. Does pt need ov?

## 2010-12-20 ENCOUNTER — Encounter: Payer: Self-pay | Admitting: Internal Medicine

## 2010-12-20 NOTE — Telephone Encounter (Signed)
Left vm for pt that we would send info. I left vm for [facility] nurse to call office back w/details of what they need.

## 2010-12-21 ENCOUNTER — Encounter: Payer: Self-pay | Admitting: Internal Medicine

## 2010-12-21 ENCOUNTER — Ambulatory Visit (INDEPENDENT_AMBULATORY_CARE_PROVIDER_SITE_OTHER): Payer: MEDICARE | Admitting: Internal Medicine

## 2010-12-21 DIAGNOSIS — J984 Other disorders of lung: Secondary | ICD-10-CM

## 2010-12-21 DIAGNOSIS — R0609 Other forms of dyspnea: Secondary | ICD-10-CM

## 2010-12-21 NOTE — Patient Instructions (Signed)
My nurse will walk you now for oxygen levels Please have full breathing test called PFT Call me after the breathing test so I can look at it and recommend next step

## 2010-12-21 NOTE — Telephone Encounter (Signed)
Nurse called me back yesterday and advised that she needed to look further into what was needed and would call me back with details.
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
## 2010-12-21 NOTE — Assessment & Plan Note (Addendum)
See overview for details. Dyspnea is worse this past year. Unclear if this is due to progressive obstruction compared to 2 years ago, poor compliance with symbicort or deconditioning or restrction due to rib fractures and loss of height. Preliminary evidence suggests that heart is not cause of dyspnea. She did not desaturate on walking which is reassuring. Will get full PFTs. Based on these results. I will advise her over phone next step

## 2010-12-21 NOTE — Assessment & Plan Note (Signed)
Micronodules. Stable On CT chest Jan 16. 2009 -> Oct 15, 2008 -> March 5. 2012. No further followup needed

## 2010-12-22 ENCOUNTER — Encounter (INDEPENDENT_AMBULATORY_CARE_PROVIDER_SITE_OTHER): Payer: MEDICARE

## 2010-12-22 DIAGNOSIS — R0609 Other forms of dyspnea: Secondary | ICD-10-CM

## 2011-01-11 ENCOUNTER — Telehealth: Payer: Self-pay | Admitting: Internal Medicine

## 2011-01-11 NOTE — Telephone Encounter (Signed)
LMTCB. lPlease tell her PFT 12/22/10 shows moderate asthma/copd just like before. Have her restart symbicort on daily basis but have her come in first avail to discuss

## 2011-01-14 ENCOUNTER — Telehealth: Payer: Self-pay | Admitting: *Deleted

## 2011-01-14 ENCOUNTER — Other Ambulatory Visit (INDEPENDENT_AMBULATORY_CARE_PROVIDER_SITE_OTHER): Payer: MEDICARE

## 2011-01-14 DIAGNOSIS — N3000 Acute cystitis without hematuria: Secondary | ICD-10-CM

## 2011-01-14 LAB — URINALYSIS, ROUTINE W REFLEX MICROSCOPIC
Bilirubin Urine: NEGATIVE
Hgb urine dipstick: NEGATIVE
Nitrite: POSITIVE
pH: 5.5 (ref 5.0–8.0)

## 2011-01-14 MED ORDER — CIPROFLOXACIN HCL 250 MG PO TABS: 250.0000 mg | ORAL_TABLET | Freq: Two times a day (BID) | ORAL | Status: AC

## 2011-01-14 NOTE — Telephone Encounter (Signed)
Results ready, please advise.

## 2011-01-14 NOTE — Telephone Encounter (Signed)
Patient requesting RF cipro. PER md u/a, pt aware, hold for results.

## 2011-01-14 NOTE — Telephone Encounter (Signed)
U/A reviewed and positive. Rx for cipro 250 done but not signed since [facility] is closed and she may want an alternative pharmacy.  thanks

## 2011-01-14 NOTE — Telephone Encounter (Signed)
Left pt vm on hm # - sent in Rx to her local pharm [name] as no other pharm is on file.

## 2011-01-17 NOTE — Telephone Encounter (Signed)
Pt aware of Rx. She says she has been getting "far too many of these infections". She has seen a urologist many years ago. Pt would like MD's advisement.

## 2011-01-18 ENCOUNTER — Telehealth: Payer: Self-pay | Admitting: Internal Medicine

## 2011-01-18 NOTE — Telephone Encounter (Signed)
Called patient//lmovm for her to call back

## 2011-01-18 NOTE — Telephone Encounter (Signed)
LMTCB

## 2011-01-18 NOTE — Telephone Encounter (Signed)
[facility] for urology evaluation is 3 or more uti in 12 months. If she is at that point will be happy to refer

## 2011-01-18 NOTE — Telephone Encounter (Signed)
Pt phoned stated that she was returning a call she can be reached after 4:00 at [phone].Lori [name]

## 2011-01-18 NOTE — Telephone Encounter (Signed)
RAMASWAMY,MURALI, MD 01/11/2011 3:05 PM Signed  LMTCB. lPlease tell her PFT 12/22/10 shows moderate asthma/copd just like before. Have her restart symbicort on daily basis but have her come in first avail to discuss    LMOMTCB to inform pt of above.

## 2011-01-18 NOTE — Telephone Encounter (Signed)
lmomtcb x1 

## 2011-01-19 MED ORDER — BUDESONIDE-FORMOTEROL FUMARATE 80-4.5 MCG/ACT IN AERO: 2.0000 | INHALATION_SPRAY | Freq: Two times a day (BID) | RESPIRATORY_TRACT | Status: DC

## 2011-01-19 NOTE — Telephone Encounter (Signed)
lmomtcb x 2  

## 2011-01-19 NOTE — Telephone Encounter (Signed)
Pt is aware of PFT results per MR and is scheduled for f/u on Fri., 5/11 @ 4:15 pm. Refill for Symbicort sent to [facility] per pt request.

## 2011-01-19 NOTE — Telephone Encounter (Signed)
Pt called again- is going out of town and reqally wants nurse to call within next 30 mins re: results. Says she has been trying to get results for 1 month and doesn't feel anyone has tried "hard enough" to contact her. [phone]. Tivis Ringer

## 2011-01-24 ENCOUNTER — Encounter: Payer: Self-pay | Admitting: Internal Medicine

## 2011-01-24 ENCOUNTER — Telehealth: Payer: Self-pay | Admitting: *Deleted

## 2011-01-24 ENCOUNTER — Other Ambulatory Visit (INDEPENDENT_AMBULATORY_CARE_PROVIDER_SITE_OTHER): Payer: MEDICARE

## 2011-01-24 DIAGNOSIS — R309 Painful micturition, unspecified: Secondary | ICD-10-CM

## 2011-01-24 DIAGNOSIS — N23 Unspecified renal colic: Secondary | ICD-10-CM

## 2011-01-24 LAB — URINALYSIS, ROUTINE W REFLEX MICROSCOPIC
Bilirubin Urine: NEGATIVE
Ketones, ur: NEGATIVE
Specific Gravity, Urine: >=1.03 (ref 1.000–1.030)
Urine Glucose: NEGATIVE
pH: 5 (ref 5.0–8.0)

## 2011-01-24 MED ORDER — PHENAZOPYRIDINE HCL 200 MG PO TABS: 200.0000 mg | ORAL_TABLET | Freq: Three times a day (TID) | ORAL | Status: AC | PRN

## 2011-01-24 MED ORDER — CEFUROXIME AXETIL 250 MG PO TABS: 250.0000 mg | ORAL_TABLET | Freq: Two times a day (BID) | ORAL | Status: AC

## 2011-01-24 NOTE — Telephone Encounter (Signed)
Positive U/A - will set up urine for culture please, change Rx to ceftin 250mg  bid x 7. May need to change if cultures reveal resistance. Uses [facility] open till 1800.

## 2011-01-24 NOTE — Telephone Encounter (Signed)
Pt aware of RX's  Lamar Sprinkles, [facility] 01/24/2011 5:24 PM Signed  Left detailed vm for pt, also sent in pyridium per VO from MD, Urine culture was faxed to labs and they were verbally informed Illene Regulus, MD 01/24/2011 5:04 PM Signed  Positive U/A - will set up urine for culture please, change Rx to ceftin 250mg  bid x 7. May need to change if cultures reveal resistance. Uses [facility] open till 1800.

## 2011-01-24 NOTE — Telephone Encounter (Signed)
Per MD, ok u/a. Order entered stat, pt aware, She is very uncomfortable. Pt has been on cipro, and has not tried any otc AZO/pyridium would you suggest this or something else to help w/pain?  Please advise as soon as results are ready.

## 2011-01-24 NOTE — Telephone Encounter (Signed)
Left detailed vm for pt, also sent in pyridium per VO from MD, Urine culture was faxed to labs and they were verbally informed

## 2011-01-24 NOTE — Telephone Encounter (Signed)
Pt is "terrible pain"-  urinating every half hour. She has been on cipro x 5 days and has ov tomorrow for re-eval. She feels she can not wait until the am and may go to the ER. Would you like to have pt come in for u/a today?

## 2011-01-25 ENCOUNTER — Other Ambulatory Visit: Payer: MEDICARE

## 2011-01-25 ENCOUNTER — Ambulatory Visit: Payer: MEDICARE | Admitting: Internal Medicine

## 2011-01-25 ENCOUNTER — Telehealth: Payer: Self-pay | Admitting: Internal Medicine

## 2011-01-25 DIAGNOSIS — N3000 Acute cystitis without hematuria: Secondary | ICD-10-CM

## 2011-01-25 NOTE — Telephone Encounter (Signed)
Attempted to call patient back but she was on the phone per her voice mail.

## 2011-01-26 ENCOUNTER — Ambulatory Visit: Payer: MEDICARE | Admitting: Nurse Practitioner

## 2011-01-26 NOTE — Telephone Encounter (Signed)
Lori [name], I could have seen her today, I had 2 cancellations, please check my schedule first.

## 2011-01-26 NOTE — Telephone Encounter (Signed)
Left message for patient to call me

## 2011-01-26 NOTE — Telephone Encounter (Signed)
Patient calling to report that she has been treated for a "raging bladder infection" by Dr. Debby Bud. She took Cipro x 1 week and is now on PCN. Now, she is having constant diarrhea and "burning in that area." Patient wants to been seen ASAP. Hx IBS, Last colon- 12/06/06- post polypectomy bleed that required hospitalization. Scheduled patient to see Willette Cluster, NP today at  2:00 PM.

## 2011-01-27 ENCOUNTER — Encounter: Payer: Self-pay | Admitting: Internal Medicine

## 2011-01-27 LAB — URINE CULTURE: Colony Count: >=100000

## 2011-01-28 ENCOUNTER — Ambulatory Visit: Payer: MEDICARE | Admitting: Internal Medicine

## 2011-01-28 NOTE — Telephone Encounter (Signed)
Patient arrived for appointment and complained that she had not been informed of results from PFT done 12/22/10.  I read her following message:  "Please tell her PFT 12/22/10 shows moderate asthma/copd just like before. Have her restart symbicort on daily basis but have her come in first avail to discuss."  Patient cannot wait for hour to see Dr. Marchelle Gearing and cannot reschedule, because [place]. Patient wants refill of Symbicort called in to [facility], [phone].

## 2011-01-28 NOTE — Telephone Encounter (Signed)
lmomtcb x1. According to 01/18/10 phone note rx for symbicort was sent x 6 refills.

## 2011-01-28 NOTE — Telephone Encounter (Signed)
MR, pt can in today for OV but was not able to wait and could not reschedule.  Pls advise if ok to send rx for symbicort.  Thanks!

## 2011-01-28 NOTE — Telephone Encounter (Signed)
I perssonally left a message for her to call back with results. Please send in symbicort. Have her come in after 3 months or at her convenience to discuss

## 2011-02-02 ENCOUNTER — Telehealth: Payer: Self-pay | Admitting: *Deleted

## 2011-02-02 DIAGNOSIS — N39498 Other specified urinary incontinence: Secondary | ICD-10-CM

## 2011-02-02 NOTE — Telephone Encounter (Signed)
Check all her records and don't see previous visit to Dr. Retta Diones. Has she seen him in the past? If yes - please ask PCC's to try for appt as requested. If hse has never been seen we may not be able to get her in that fast. Is the reason for consult recurrent UTI's?

## 2011-02-02 NOTE — Telephone Encounter (Signed)
Pt is coming back from beach now. She is req that we make her an apt with Dr Desmond Dike this Thursday or Friday. Please advise.

## 2011-02-03 NOTE — Telephone Encounter (Signed)
Reviewed message late this pm - pt has OV scheduled to discuss issue with MD tomorrow AM

## 2011-02-04 ENCOUNTER — Ambulatory Visit (INDEPENDENT_AMBULATORY_CARE_PROVIDER_SITE_OTHER): Payer: 59 | Admitting: Internal Medicine

## 2011-02-04 ENCOUNTER — Encounter: Payer: Self-pay | Admitting: Internal Medicine

## 2011-02-04 ENCOUNTER — Other Ambulatory Visit (INDEPENDENT_AMBULATORY_CARE_PROVIDER_SITE_OTHER): Payer: 59

## 2011-02-04 ENCOUNTER — Other Ambulatory Visit: Payer: 59

## 2011-02-04 VITALS — BP 138/66 | HR 77 | Temp 97.0°F | Wt 137.0 lb

## 2011-02-04 DIAGNOSIS — N390 Urinary tract infection, site not specified: Secondary | ICD-10-CM

## 2011-02-04 DIAGNOSIS — R300 Dysuria: Secondary | ICD-10-CM

## 2011-02-04 LAB — URINALYSIS, ROUTINE W REFLEX MICROSCOPIC
Bilirubin Urine: NEGATIVE
Ketones, ur: NEGATIVE
Urine Glucose: NEGATIVE
Urobilinogen, UA: 0.2 (ref 0.0–1.0)

## 2011-02-04 MED ORDER — ESOMEPRAZOLE MAGNESIUM 40 MG PO CPDR: 40.0000 mg | DELAYED_RELEASE_CAPSULE | Freq: Every day | ORAL | Status: DC

## 2011-02-04 NOTE — H&P (Signed)
NAME:  Lori, [name] NO.:  [id number]   MEDICAL RECORD NO.:  [id number]          PATIENT TYPE:  OIB   LOCATION:  1343                         FACILITY:  [facility]   PHYSICIAN:  Barbette Hair. Arlyce Dice, MD,FACGDATE OF BIRTH:  [birth year]   DATE OF ADMISSION:  12/05/2006  DATE OF DISCHARGE:                              HISTORY & PHYSICAL   CHIEF COMPLAINT:  Rectal bleeding.   HISTORY:  Lori [name] is a pleasant, 77 year old, white female known to Dr.  Jarold Motto who has undergone recent evaluation for diarrhea and urgency  which have been present since February 2008.  She says that her symptoms  started while she is was visiting in [place].  She also complains of  having a dull ache in her left side. She was seen initially by Dr.  Debby Bud, treated with a course of Cipro and Flagyl without benefit.  She  also had seen her urologist and was given a course of an antibiotic for  a UTI during that time as well.  She had CT of the abdomen and pelvis  November 07, 2006 which did not show any cause for her left lower  quadrant pain.  No evidence for diverticulitis.  She did have a cluster  of nodules in the left lower lobe which were felt to be likely  inflammatory but follow-up is recommended.   The patient had a colonoscopy on November 27, 2006 with a finding of  scattered diverticulosis and a 1.5-cm polyp in the ascending colon which  was sessile. This was removed piecemeal. Biopsies are consistent with an  adenomatous polyp, random biopsies of the colon were also done and  showed no evidence of microscopic colitis and a small-bowel biopsy was  done which was negative with no evidence for Giardia.  At this time, she  has had onset this morning with bright red blood per rectum x2. She had  one episode with a clot.  She felt that she had passed a lot of blood,  had called the office and was advised to come to the emergency room.  She is unhappy as she says she still does not feel well and  has been  having persistent diarrhea with 7-8  bowel movements per day, some  intermittent left lower quadrant discomfort and says that she is afraid  to leave the house. She says I just do not feel good.  Hemodynamically  she is stable.  She is admitted at this time for observation for a post  polypectomy bleed.  Labs are pending.   MEDICATIONS:  Multivitamin daily, Diovan/HCTZ 80/12.5 daily, Boniva 150  daily and Spiriva p.r.n.   ALLERGIES:  PENICILLIN which causes hives.   PAST HISTORY:  Pertinent for hypertension.  She is status post remote  cholecystectomy.  She has a history of colon polyps and diverticulosis.   FAMILY HISTORY:  Mother deceased at 46 with coronary disease.  Father at  38 with old age.   SOCIAL HISTORY:  The patient is married.  She has three grown children.  She is an ex-smoker.  No regular ETOH.  REVIEW OF SYSTEMS:  CARDIOVASCULAR:  Denies any chest pain or anginal  symptoms.  PULMONARY:  Negative for cough, shortness of breath or sputum  production.  GENITOURINARY:  Negative currently.  MUSCULOSKELETAL:  Negative.  NEURO:  Negative.   PHYSICAL EXAM:  VITAL SIGNS:  Temperature is 97.6, blood pressure  150/78, pulse is 87, sat is 96.  HEENT:  Nontraumatic, normocephalic.  EOMI, PERRLA.  Sclerae anicteric.  There is no JVD.  NECK:  Supple.  CARDIOVASCULAR:  Regular rate and rhythm with S1 and S2.  No murmur, rub  or gallop.  PULMONARY:  Clear to A and P.  ABDOMEN:  Soft.  Bowel sounds are active.  He is basically nontender.  There is no palpable mass or hepatosplenomegaly.  RECTAL:  Decreased sphincter tone and mucusy blood on the glove.  EXTREMITIES:  Without clubbing, cyanosis or edema.  NEURO:  Nonfocal.   IMPRESSION:  19. A 77 year old white female with acute lower gastrointestinal bleed      consistent with a post polypectomy bleed.  2. Diarrhea x7 weeks and intermittent left lower quadrant pain,      etiology not clear with negative workup thus  far.  3. Status post remote cholecystectomy.  4. Hypertension.  5. Osteoporosis.  6. Abnormal CT scan with cluster of nodules in the left lower lobe of      the lung, needs follow-up.   PLAN:  The patient is admitted to the service of Dr. Melvia Heaps for  observation, bowel rest, serial H&H, transfusions as indicated.  Will  also check stool for C&S, O&P, C Dif, and lactoferrin. For details see  the orders. We may also need to proceed with CT of the chest.      Amy Esterwood, PA-C      Barbette Hair. Arlyce Dice, MD,FACG  Electronically Signed    AE/MEDQ  D:  12/05/2006  T:  12/05/2006  Job:  [id number]

## 2011-02-04 NOTE — H&P (Signed)
[address] [facility]  Patient:    Lori [name], Lori [name]                         MRN: [id number] Adm. Date:  [id number] Disc. Date: [id number] Attending:  Tresa Garter CC:         Rosalyn Gess. Norins, M.D. [facility]  E. Graceann Congress, M.D. [facility]  Vania Rea. Jarold Motto, M.D. [facility]   History and Physical  DATE OF BIRTH:  June 07, 1926  CHIEF COMPLAINT:  Spastic abdominal pain, weakness, nausea and vomiting, diarrhea.  HISTORY OF PRESENT ILLNESS:  The patient is a 77 year old white female who started to have severe abdominal cramp while eating breakfast at 8 oclock in the morning followed by several large stools with blood, weakness, and nausea; she almost passed out.  Denied chest pain.  In the emergency room she had another episode of abdominal pain and she became incontinent of stool.  PAST MEDICAL HISTORY:  Pneumonia, left hospital last Tuesday.  She was treated with Biaxin and prednisone.  Cholecystectomy.  MEDICATIONS:  Prempro, Fosamax 70 mg weekly, last dose on Monday, multivitamin 1 q.d.  ALLERGIES:  PENICILLIN and SULFA.  SOCIAL HISTORY:  She is married with three children.  Quit smoking 30 years ago.  FAMILY HISTORY:  Mother died at age of 49 with heart disease.  Father died at age of 78 with old age.  REVIEW OF SYSTEMS:  She was feeling well prior to this episode.  No history of rectal bleeding.  She had colonoscopy two years ago.  No cough.  The rest is negative.  PHYSICAL EXAMINATION:  VITAL SIGNS:  Blood pressure 90/70, pulse 56, temperature unknown, respirations 20.  GENERAL:  She is in mild distress.  Does not appear toxic.  HEENT:  Slightly dry oral mucosa.  NECK:  Supple, no meningeal signs.  LUNGS:  Clear to auscultation and percussion.  HEART:  S1, S2, no murmur, no gallop.  ABDOMEN:  Soft, nontender, no organomegaly, no masses felt.  Bowel sounds hyperactive.  No rebound symptoms.  RECTAL EXAMINATION:  Reveals large amount of brown in  content stool with streaks of dark blood.  No masses.  EXTREMITIES:  Lower extremities without edema.  MENTAL STATUS:  She is alert, oriented, and cooperative.  SKIN:  Clear.  LABS:  EKG normal sinus rhythm.  White count 19.5, hemoglobin 13.7; creatinine 0.9, BUN 37.  ASSESSMENT: 1. Abdominal pain, spastic-like, but due to enterocolitis.  MS intravenous    p.r.n. 2. Dehydration, treat with intravenous fluids. 3. Rule out Clostridium difficile colitis.  Empiric Flagyl 500 p.o. q.d. for    7-14 days.  Will obtain stool for Clostridium difficile toxin. 4. Nausea and vomiting, will use intravenous Phenergan. 5. Diarrhea, use Lomotil. 6. Elevated white count, repeat tomorrow.  Abdominal exam was unremarkable    now. DD:  01/03/01 TD:  01/04/01 Job: [id number]

## 2011-02-04 NOTE — Discharge Summary (Signed)
[name], [name] NO.:  [id number]   MEDICAL RECORD NO.:  [id number]          PATIENT TYPE:  INP   LOCATION:  1343                         FACILITY:  [facility]   PHYSICIAN:  Barbette Hair. Arlyce Dice, MD,FACGDATE OF BIRTH:  1926/01/07   DATE OF ADMISSION:  12/05/2006  DATE OF DISCHARGE:  12/07/2006                               DISCHARGE SUMMARY   ADMITTING DIAGNOSES:  35. A 77 year old white female with acute lower gastrointestinal bleed      consistent with a post-polypectomy bleed.  2. Diarrhea x7 weeks, with intermittent left lower quadrant pain,      etiology not clear with negative workup to date.  3. Status post remote cholecystectomy.  4. Hypertension.  5. Osteoporosis.  6. Abnormal computerized tomograph of the abdomen, with cluster of      nodules in the left lower lobe.  Needs outpatient followup.   DISCHARGE DIAGNOSES:  1. Resolved post-polypectomy hemorrhage, status post endoclipping at      polypectomy site, December 06, 2006.  2. Anemia, secondary to above.  3. Diarrhea x8 weeks, etiology not clear with negative workup to date.      CT enterography is pending at this time.  Stool cultures negative.  4. Status post remote cholecystectomy.  5. Hypertension.  6. Osteoporosis.  7. Abnormal computerized tomograph of the abdomen, with cluster of      nodules in the left lower lobe.  Needs outpatient followup.   CONSULTATIONS:  None.   PROCEDURES:  Colonoscopy with endoclipping.   BRIEF HISTORY:  Lori [name] is a pleasant 77 year old white female, known to  Dr. Jarold Motto and also Dr. Debby Bud, who has undergone a recent evaluation  for her of diarrhea and urgency, which have been present since February  2008.  She says her symptoms started in January while she was visiting  in [place].  She also complains of an intermittent dull ache in her left  side.  She had been treated initially by Dr. Debby Bud with a course of  antibiotics for diverticulitis with no change in  her symptoms and then  was seen by Dr. Jarold Motto and given a course of Xifaxan and Alinia,  again which have not been beneficial.  She had colonoscopy on March 10  with finding of scattered diverticulosis, and she had a 1.5-cm polyp in  her a ascending colon, which was sessile and was removed piecemeal.  Biopsies are consistent with an adenomatous polyp.  Random biopsies of  the colon were negative as were biopsies of the terminal ileum and no  evidence of Giardia.  At this time, she presents with acute onset of  rectal bleeding.  On the morning of admission, she had 2 episodes, 1  with a large clot.  She called the office, was advised to come to the  emergency room, seen and evaluated in the ER, felt to be hemodynamically  stable but having a post-polypectomy bleed and admitted for supportive  management.   LABORATORY STUDIES:  Stool for C&S negative.  Stool for C. diff  negative.  Stool for O&P negative.  Celiac panel was  done and is  pending.  Sed rate 3.  WBC of 6.3 on admission, hemoglobin 12.5,  hematocrit 36.6, MCV 86, platelets 253.  Serial values were obtained.  By late in the day on March 18, hemoglobin was 10.2 with hematocrit  29.3.  On the morning of March 19, hemoglobin 9.7 with hematocrit 28.1.  Follow-up hemoglobin 10.3 and 30, and then followup this morning 9.5 and  29.  Pro time on admission 12.7, INR 0.9, PTT 30.  Electrolytes within  normal limits.  BUN 22, creatinine 0.6.   X-RAY STUDIES:  CT enterography was done on March 19, has not been read  at the time of this dictation and will be followed up on an outpatient  basis.   HOSPITAL COURSE:  The patient was admitted to the service of Dr. Melvia Heaps who was covering the hospital.  She was placed on IV fluids and  kept at bedrest and on a clear liquid diet.  She did have evidence of  continued bleeding through the first night and was prepped for repeat  colonoscopy early on the morning of March 19.  Her  hemoglobin bottomed  out at about 9.5, and she leveled off thereafter and did not require  transfusion.  I believe that her bleeding stopped by the time she had  finished her bowel prep.  She had colonoscopy with Dr. Arlyce Dice on the  19th.  There was an ulcer with pigment at the polypectomy site, and 2  endoclips were applied.  She tolerated the procedure without difficulty  and has not had any further evidence of active bleeding.  She did  undergo the CT enterography late on the evening of March 19 and again  tolerated that without difficulty, and report is pending.  On the  morning of March 20, she is feeling well.  Her hemoglobin is stable at  9.5, and she is requesting discharge to home.  It is felt that she is  stable for discharge.  She will maintain a low-residue diet and avoid  all aspirin and aspirin products at least for the next 2 weeks.  She  will follow up with Dr. Jarold Motto in the office on the 28th and follow  up with Dr. Debby Bud and his office on the 26th.  She will be discharged  on Imodium 1 p.o. q.a.m. before breakfast and then Questran 4 gm in  water or juice b.i.d., Diovan 160 mg daily as previous, Boniva once  monthly and a multivitamin as previous.  From a GI standpoint, she will  need to follow up on the CT enterography as an outpatient, and from a  medicine standpoint, I have discussed this with Dr. Debby Bud, but she had  CT scan of the abdomen and pelvis done February 2008 that did mention an  abnormal cluster of nodules in the left lower lobe of her lung, which  were felt to be likely inflammatory, but followup was suggested, and she  will likely need CT scan of the chest done as an outpatient to confirm  resolution of these and to rule out malignancy.   DISCHARGE DIET:  Low residue.   CONDITION ON DISCHARGE:  Stable and improved.      Amy Esterwood, PA-C      Robert D. Arlyce Dice, MD,FACG  Electronically Signed   AE/MEDQ  D:  12/07/2006  T:  12/07/2006   Job:  [id number]   cc:   Vania Rea. Jarold Motto, MD, FACG, FACP, FAGA  [address]  [place]  [place] [address]   Rosalyn Gess. Norins, MD  [address]  [place]  [place] [address]

## 2011-02-04 NOTE — Assessment & Plan Note (Signed)
[facility]                                 ON-CALL NOTE   Lori [name], Lori [name]                         MRN:          [id number]  DATE:11/22/2006                            DOB:          [birth year]    This message is for Dr. Jarold Motto.   Her home number is [phone].   [name] is Dr. Norval Gable patient who called tonight, trying to get  hold of Dr. Jarold Motto.  Apparently, she returned from a trip to [place],  having diarrhea.  She saw Dr. Kendrick Ranch later this afternoon, who told  her that she needs to get immediately in touch with Dr. Jarold Motto  because he felt that she had a diverticulitis.  He gave her topical  steroids for rectal irritation.  He is to continue her on her Cipro.  She is also taking Imodium for her diarrhea.  There has been no fever.  Patient has been on a liquid diet.   It is my impression that this is not an emergency and that the patient  does not need to go to emergency room tonight, but I will contact Dr.  Jarold Motto in the morning and see if he could work her into his schedule.     [name]. Juanda Chance, MD  Electronically Signed    DMB/MedQ  DD: 11/22/2006  DT: 11/23/2006  Job #: [id number]

## 2011-02-04 NOTE — Assessment & Plan Note (Signed)
Lori [name]                         GASTROENTEROLOGY OFFICE NOTE   Lori [name], Lori [name]                         MRN:          [id number]  DATE:11/24/2006                            DOB:          1925/11/13    Lori [name] is a 77 year old white female who has really been in good  health all of her life, no serious medical or surgical problems.  She  did have a urinary tract infection in early February and was seen by Dr.  Debby [name] and at that time was also felt to have an element of  diverticulitis and was treated with Cipro 500 mg twice a day and  metronidazole 500 mg t.i.d. for 10 days.  She comes to my office today  because of continued persistent lower abdominal pain and diarrhea.   Lori [name] was having fairly regular bowel movements until she was in [place]  and developed a diarrhea with rectal urgency and some incontinency in  early February.  She had a dull left low quadrant pain which has  persisted despite treatment by Lori [name].  When he saw her in the  office on November 06, 2006 he ordered CT scan of the abdomen and pelvis  which was essentially unremarkable expect for a small amount of gas in  the urinary bladder, felt secondary to catheterization.  The bowel  otherwise appeared unremarkable and there was no evidence of specific  diverticulitis.  The gallbladder was noted to be surgically absent.   The patient denies any upper GI or hepatobiliary complaints.  She has a  constant dull aching sensation in her left lower quadrant.  It seemed to  abating, as does her diarrhea.  She is using Imodium several times a  day, her stools are starting to have some form.  She has had no melena  or hematochezia.  She saw Lori [name] recently because of anal irritation  and he placed her on some anal salves.   I have done multiple colonoscopy on Lori [name] because of a previous cecal  polyp that was removed several years ago.  It appears her last negative  colonoscopy exam was on October 08, 2003.  She has had some  diverticulosis noted in the past.  She denies any recent dietary  indiscretions.  There have been no sick family members.  She does carry  a diagnosis of chronic irritable bowel syndrome and has had chronic  anal dysfunctions.  Has had anorectal monometry at [facility]  The  patient has not had any specific gynecologic problems and is under the  care of Lori [name].  She is status post cholecystectomy in 1996 and also  has had previous appendectomy.  Her only diagnoses include  hyperlipidemia and osteopenia.  Is followed really closely by Dr.  Debby [name].   MEDICATIONS:  1. Diovan HCTZ 80/12.5 mg daily.  2. Multivitamins daily.  3. Boniva 150 mg a month.  4. Spiriva inhaler daily.  5. She was on simvastatin which she discontinued.   FAMILY HISTORY:  Noncontributory in terms of known gastrointestinal  problems, although she thinks her father 71  have had inflammatory bowel  disease.   SOCIAL HISTORY:  She is married, lives with her husband.  Has a college  education.  She currently is not employed.  Does not smoke and uses  ethanol socially.   REVIEW OF SYSTEMS:  Noncontributory except for some chronic insomnia,  shortness of breath on exertion.  She does not have any skin rashes,  joint pains, oral stomatitis, etc.  She denies urinary problems at this  time.  Had a negative urinalysis on November 10, 2006.   LABORATORY DATA:  From November 06, 2006 she had a normal CBC, white  count, and she had a negative stool exam for C. Difficile toxin.   EXAMINATION:  She is a healthy-appearing nontoxic white female appearing  her stated age in no acute distress.  She is 5 feet 5 inches tall and  weighs 145 pounds.  Blood pressure 113/70, pulse was 64 and regular.  Could not appreciate thyromegaly or lymphadenopathy.  CHEST:  Clear to percussion and auscultation. She appeared to be in a  regular rhythm without murmurs, gallops, or  rubs.  Could not appreciate hepatosplenomegaly, abdominal masses, or  significant tenderness.  Bowel sounds were normal.  RECTUM:  Inspection did show some perianal erythema but otherwise was  unremarkable without fissures or fistulae.  There were no rectal masses  or tenderness and stool was guaiac negative.  EXTREMITIES:  Unremarkable.  MENTAL STATUS:  Clear.   ASSESSMENT:  1. It certainly sounds like Ms. Bradish has had subacute diverticulitis,      perhaps she has segmental colitis.  As mentioned above she does      have a diagnosis of chronic irritable bowel syndrome which may have      been flared up by recent infectious process.  2. Diarrhea seems to be improving at this time.  3. History of chronic lactose intolerance.  She denies use of sorbitol      or fructose.  4. Mid chronic obstructive lung disease.  5. Previous history of cecal polyp with negative followup exams.   RECOMMENDATIONS:  1. Trial over the next several days with Xifaxan 400 mg t.i.d. along      with Align probiotic therapy.  2. NuLev sublingually every 4-6 hours for colonic spasms.  3. Low fiber diet.  4. Followup colonoscopy exam within the next several days as      tolerated.  5. Continue salve locally to her anorectal area.  6. Other medications as per Lori [name].     Lori Rea. Jarold Motto, MD, Caleen Essex, FAGA  Electronically Signed    DRP/MedQ  DD: 11/24/2006  DT: 11/24/2006  Job #: [id number]   cc:   Rosalyn Gess. Norins, MD  Sheppard Plumber. Earlene Plater, M.D.

## 2011-02-04 NOTE — H&P (Signed)
NAME:  Lori [name], Lori [name]                            ACCOUNT NO.:  [id number]   MEDICAL RECORD NO.:  [id number]                   PATIENT TYPE:  OIB   LOCATION:  2899                                 FACILITY:  [facility]   PHYSICIAN:  Peter M. Swaziland, M.D.               DATE OF BIRTH:  12-04-1925   DATE OF ADMISSION:  03/09/2004  DATE OF DISCHARGE:  03/09/2004                                HISTORY & PHYSICAL   HISTORY OF PRESENT ILLNESS:  Lori [name] is a 77 year old white female who is  being evaluated for refractory dental pain.  The patient presents with a six-  week history of intermittent dental pain.  She subsequently underwent  extensive dental evaluation and was told that her teeth and gums were in  perfect condition.  She has had persistent dental pain that has been noted  to be very responsive to sublingual nitroglycerin.  She was admitted to  [facility] this past weekend and ruled out for myocardial  infarction.  ECG showed nonspecific ST-T wave changes; however, given her  significant response to nitroglycerin it was recommended that she undergo  coronary angiography to rule out anginal equivalent coronary disease.  Ms.  [name] does have cardiac risk factors of hypercholesterolemia and  hypertension.  The patient had been evaluated previously in 2001 when she  developed exertional dyspnea and chest tightness.  She had an echocardiogram  at that time which was normal and a stress Cardiolite study which was  normal.  She is physically active.   PAST MEDICAL HISTORY:  She has had previous appendectomy, D&C,  tonsillectomy, sinus surgery.  She has a history of hypertension and history  of hypercholesterolemia.  She has had previous pneumonia.   ALLERGIES:  PENICILLIN.   CURRENT MEDICATIONS:  1. Fosamax 70 mg weekly.  2. Multivitamin daily.  3. Ecotrin 81 mg per day.  4. Norvasc 5 mg per day.  5. Isosorbide 30 mg per day.  6. Nitroglycerin p.r.n.  7. Hydrocodone q.i.d.  p.r.n.  8. Lipitor 20 mg per day.  9. Calcium supplement daily.   SOCIAL HISTORY:  The patient is retired.  She lives much of her time in  [place].  She enjoys playing tennis and golf.  She denies tobacco or  alcohol use.   FAMILY HISTORY:  Father died at age 74 of old age.  Mother died at age 86  with myocardial infarction.  She has one sibling in good health.   REVIEW OF SYSTEMS:  Otherwise unremarkable.   PHYSICAL EXAMINATION:  GENERAL:  The patient is a pleasant white female in  no apparent distress.  VITAL SIGNS:  Her weight is 147.  Her pulse is 68 and regular, blood  pressure is 130/84.  HEENT:  Pupils are equal, round, and reactive.  Conjunctivae are clear.  Oropharynx is clear.  Dentition is in good repair.  NECK:  Supple  without JVD, adenopathy, thyromegaly, or bruits.  LUNGS:  Clear to auscultation and percussion.  CARDIAC:  Regular rate and rhythm.  Normal S1 and S2 without gallops,  murmurs, rubs, or clicks.  ABDOMEN:  Soft and nontender without hepatosplenomegaly, masses, or bruits.  EXTREMITIES:  Femoral and pedal pulses are 2+ and symmetric.  She has no  edema.  NEUROLOGIC:  Exam is nonfocal.   LABORATORY DATA:  Recent laboratory evaluation from [facility]:  Her ECG  showed normal sinus rhythm with nonspecific T-wave abnormality.  There was  one brief run of supraventricular tachycardia.   Her cardiac enzymes were all normal.  BUN was 25, creatinine 0.7, glucose  81, potassium 4.7, chloride 103, bicarb of 26.  LFT's were all normal.  White count was 5200, hemoglobin 15.4, platelet count 201,000.   Chest x-ray showed no active disease.   IMPRESSION:  1. Recurrent and refractory dental pain responsive to nitroglycerin,     possible anginal equivalent.  2. Hypertension.  3. Hypercholesterolemia.   PLAN:  We will proceed with cardiac catheterization.                                                Peter M. Swaziland, M.D.    PMJ/MEDQ  D:  03/09/2004   T:  03/10/2004  Job:  [id number]   cc:   Cassell Clement, M.D.  [address], [address]  [place]  [place] [address]  Fax: [phone]   Titus Dubin. Alwyn Ren, M.D. [facility]

## 2011-02-04 NOTE — Op Note (Signed)
NAME:  Lori [name], Lori [name]                            ACCOUNT NO.:  [id number]   MEDICAL RECORD NO.:  [id number]                   PATIENT TYPE:  AMB   LOCATION:  DAY                                  FACILITY:  [facility]   PHYSICIAN:  Jamison Neighbor, M.D.               DATE OF BIRTH:  Apr 22, 1926   DATE OF PROCEDURE:  12/02/2003  DATE OF DISCHARGE:                                 OPERATIVE REPORT   PREOPERATIVE DIAGNOSIS:  1. Stress urinary incontinence.  2. Cystocele.  3. Rectocele.   POSTOPERATIVE DIAGNOSIS:  1. Stress urinary incontinence.  2. Cystocele.  3. Rectocele.   PROCEDURE:  1. Cystoscopy.  2. SPARC pubovaginal sling.  3. Anterior and posterior repair, cystocele and rectocele.   SURGEON:  Jamison Neighbor, M.D.   Threasa HeadsIvette Loyal.   ANESTHESIA:  General.   COMPLICATIONS:  None.   DRAINS:  A 16 French Foley catheter.   BRIEF HISTORY:  This 77 year old female has a modest cystocele and rectocele  but well-documented stress incontinence.  The patient had a urodynamic  evaluation because she did have somewhat elevated postvoid residual.  When  she underwent evaluation, she was found to have a diminished leak point  pressure but also a somewhat low detrusor pressure, indicating that if she  is made too tight, she might have some problems in the postoperative period  with retention.  We did note that the patient's cystocele contributed some  retention, and we hope that her ability to generate pressure within the body  will improve once that is repaired, but we are cognizant of the fact that  she needs to not be made too tight in order to prevent retention.  The  patient would like to have a rectocele done because she is somewhat  symptomatic, even though the rectocele is relatively small.  I told her we  would do a site-specific repair and not do a large perineal repair or  recommend hysterectomy if not indicated.  Patient understands the risks and  benefits of the  procedure and gave informed consent.   PROCEDURE:  After successful induction of general anesthesia, the patient  was placed in the dorsal lithotomy position and prepped with Betadine.  Draped in the usual sterile fashion.  Careful bimanual examination of the  uterus did reveal the presence of a cystocele as well as some urethral  immobility and a rectocele.  The anterior vaginal mucosa was then  infiltrated with a local anesthetic.  An incision was made from the mid  urethral complex back to the uterus and cardinal ligaments.  The flaps of  the mucosa were raised bilaterally back to the endopelvic fascia.  The  patient really did not have a large lateral defect but did have a central  defect.  Plication sutures were used to repair the central defect, which  really did elevate nicely.  The space of Retzius  was entered.  The [facility]  needles were passed from the abdominal incision down to the bottom incision,  and the SPARC sling was then pulled up.  It was tensioned by placing Mayo  scissors between the urethra and the bladder neck.  Cystoscopic examination  was done before and after placement of the sling with both 12 degree and 7  degree lenses.  At no point did the needles or the sling enter the bladder.  The tension was then set so that the cystoscope could be directly placed  within the bladder without any angulation.  There was 45 degrees of play of  the cystoscope.  There was good co-optation of the mucosa.  With Crede  maneuver, there was prompt straight flow of urine.  The mucosa was then  closed with a suture of 2-0 Vicryl.  The sling was cut off at the skin level  and closed with 4-0 Vicryl and Steri-Strips.  The final results showed good  support for the urethra but not excessive tension.  The cystocele appeared  to be well reduced.  The uterus was really not prolapsing, and the bladder  neck was well supported but not excessively angulated.  The patient did have  a rectocele,  and an incision was made over the top of the rectocele after  infiltration.  The dissection proceeded out to the lateral fascia.  The  fascia was plicated across the midline, correcting the rectocele.  Redundant  mucosa was removed, and the area was then closed with a running suture of 2-  0 Vicryl.  The patient tolerated the procedure well and was taken to the  recovery room in good condition.  She had packing in place.  The Foley  catheter was left in place.  These will be removed in the morning for a  voiding trial.  She will be discharged home voiding normally.                                               Jamison Neighbor, M.D.    RJE/MEDQ  D:  12/02/2003  T:  12/02/2003  Job:  [id number]

## 2011-02-04 NOTE — Discharge Summary (Signed)
[facility]  Patient:    DALAYZA, ZAMBRANA                         MRN: [id number] Adm. Date:  [id number] Disc. Date: [id number] Attending:  Tresa Garter CC:         Rosalyn Gess. Norins, M.D. [facility]             E. Graceann Congress, M.D. [facility]                           Discharge Summary  DATE OF BIRTH:  [birth year]  FINAL DIAGNOSES: 1. Enterocolitis possibly related to Clostridium difficile. 2. Elevated white count likely related to problem #1 and treatment with    steroids. 3. Recent pneumonia. 4. Dehydration corrected.  HISTORY OF PRESENT ILLNESS:  The patient is a 77 year old white female who presented to the ER yesterday with severe cramping abdominal pain, multiple bloody stools, nausea, and vomiting.  She was weak and almost passed out. During the course of hospitalization, she was hydrated with IV fluids.  She was started on oral Flagyl and IV Phenergan, and p.o. Lomotil.  On the day of discharge, she is feeling well.  She has not had any more stools, no nausea or vomiting.  She has been able to eat, ambulate, and has no complaints.  Temperature 98.4, pulse 71, respirations 18, blood pressure 127/57.  Looks well.  HEENT with moist mucosa.  Skin clear.  Neck supple. Lungs clear.  Heart regular.  Abdomen soft, nontender.  No organomegaly, no masses felt.  Lower extremities without edema.  Alert, oriented, and cooperative.  LABORATORY DATA:  Hemoglobin 12.0, potassium 5.0, sodium 135.  Glucose was elevated likely due to contamination.  White count 15.5 which is down from 19K yesterday.  Clostridium difficile EIA is still pending.  DISCHARGE MEDICATIONS: 1. Flagyl 500 mg one three times a day for six days.  Risks, benefits, side    effects, interactions were all discussed. 2. Resume other home medicines except prednisone. 3. Take Lomotil and Phenergan if needed.  FOLLOW-UP:  Dr. Debby Bud in two to three weeks.  DIET:  Avoid greasy, spicy food.  No  alcohol for 10 days. DD:  01/04/01 TD:  01/04/01 Job: [id number]

## 2011-02-04 NOTE — Discharge Summary (Signed)
[facility]  Patient:    Lori [name], Lori [name]                         MRN: [id number] Adm. Date:  [id number] Disc. Date: 12/26/00 Attending:  Duke Salvia CC:         Titus Dubin. Alwyn Ren, M.D. [facility]   Discharge Summary  ADMITTING DIAGNOSIS:  Right lower lobe pneumonia.  DISCHARGE DIAGNOSES: 1. Right lower lobe pneumonia. 2. Episode of acute pulmonary edema with no cardiac etiology. 3. Steroid induced hyperglycemia. 4. Hypertension.  CONSULTATION:  Dr. Veneda Melter, cardiology.  PROCEDURES: 1. On December 20, 2000, x-ray revealed right lower lobe pneumonia. 2. Followup x-ray on December 21, 2000, with bronchovascular markings bilaterally    with question of fluid overload with early vascular congestion. 3. On December 22, 2000, extensive bilateral lung densities representing    superimposed pulmonary edema with right lower lobe pneumonia. 4. On December 23, 2000, resolution of pulmonary edema with persistent right lower    lobe infiltrate. 5. Followup x-ray on December 24, 2000, significant improvement in the appearance    of central right lower lobe opacity. 6. Followup x-ray on December 26, 2000, slight decrease in right infra-hilar    density representing resolving pneumonia. 7. 2D echocardiogram performed April 23, 2001, which revealed normal left    ventricular ejection fraction, normal left ventricular size, normal left    ventricular wall thickness, no significant valvular disease at the aortic    valve.  Tricuspid valve was normal.  Study was considered normal. 8. Cardiolite study was read out as being a normal study with no evidence of    ischemia.  HISTORY OF PRESENT ILLNESS:  [name] is a 77 year old woman followed by Dr. Alwyn Ren who was seen in the office on April 1, for respiratory symptoms. She had a chest x-ray at that time which was read out as clear.  Since that time, the patient has continued to have shortness of breath and myalgias and woke up on the  date of admission with increasing work of breathing.  She had a cough productive of yellow sputum with pain across her chest.  She had chills. Her initial evaluation revealed the patient to have right lower lobe infiltrate consistent with pneumonia.  She had a markedly elevated white count at 23,100 with 92% segs, 3% lymphs, 4% monos.  The patient was subsequently admitted for treatment.  PAST MEDICAL HISTORY: 1. Diagnosed with bronchiectasis 15 years ago. 2. Status post cholecystectomy. 3. Status post appendectomy. 4. Status post history of pneumonia on two previous occasions, most recently    12 years ago. 5. No prior history of hypertension, diabetes, CAD, peptic ulcer disease,    asthma or COPD.  SOCIAL HISTORY:  The patient is married and she has three grown children.  her marriage is in good health and she has a supportive family.  No tobacco with patient having quit smoking 30 years ago.  She uses occasional alcohol.  FAMILY HISTORY:  Mother had pericarditis and died at age 98.  Father died at age 60 of pneumonia.  PHYSICAL EXAMINATION:  VITAL SIGNS:  Afebrile at 96.9, blood pressure 176/89.  CHEST:  Notable for inspiratory cough with harsh breath sounds and scattered wheezing.  Decreased breath sounds at the anterior right chest.  HOSPITAL COURSE:  #1 - PNEUMONIA:  The patient with community-acquired pneumonia with a slightly atypical presentation with the absence of fever, but positive infiltrate with leukocytosis  with an ABG revealing a relative hypoxemia with a pO2 of 62.  The patient was initially given a dose of Tequin, but was changed to Rocephin 1 g IV and Biaxin XL 1000 mg daily.  The patient developed significant tightness and wheezing with increasing dyspnea.  The patient was started on albuterol handheld nebulizer treatments and was started on Solu-Medrol.  The patient had significant agitation with albuterol treatments and was changed to Xopenex nebulizer  treatments which she tolerated well.  The patient had persistent improvement in regards to her infiltrate. She did have a drop in her leukocytosis, but no complete resolution possibly secondary to steroid effect.  Her differential did improve to 86% segs, 12% lymphs and 2% monos.  The patients wheezing improved as did her shortness of breath and she was tapered on Solu-Medrol and then switched to p.o. prednisone.  The patient continued to do well.  Chest x-rays were followed as noted above.  The patient being on oral antibiotics and steroids, she was felt to be ready for discharge.  #2 - CARDIOVASCULAR:  The patient had the onset of increasing shortness of breath on April 5.  Chest x-ray revealed significant pulmonary edema.  The patient had a subsequent cardiac evaluation with 2D echocardiogram and Cardiolite studies as noted.  It was felt that the patients pulmonary edema most likely represented a fluid overload exacerbated by fluid retention with antibiotics.  The patient was successfully diuresed using Lasix.  This problem resolved with no radiographic evidence of persistent edema.  There was no evidence of any cardiac injury or ischemia.  Of note, the patient did have elevated cardiac enzymes which were thought secondary to her pulmonary edema and pneumonia alone.  #3 - HYPERTENSION:  The patients blood pressures were noted to be intermittently elevated.  During her hospital stay, she was started on Altace at 2.5 mg b.i.d.  In the setting of rule out for MI, she was also started on Lopressor 25 mg b.i.d. and aspirin.  The patients blood pressures were slightly variable occasionally running high, but at the time of discharge were improved.  The patient will be discharged home on Altace alone with discontinuation of beta blocker.  #4 - PSYCHIATRIC:  The patient had significant problems with agitation and confusion during her hospital stay.  This was thought to be secondary  to  medication reactions, particularly albuterol as well as the unfamiliar surroundings.  The patient was treated with Ativan and Zyprexa was added initially at 2.5 mg and then 5 mg q.h.s. with improvement.  Of note, the patient had a very difficult time tolerating being in the critical care unit and being hooked to a monitor, so this was discontinued.  The patient did better afterwards.  At the time of discharge, the patient seems to be at her mental baseline.  Would recommend continuing Zyprexa for several days as taper.  #5 - HYPERGLYCEMIA:  The patient developed significantly elevated serum glucose levels.  It was thought this was secondary to steroid effect. Hemoglobin A1C was checked which was 6.8% suggesting that the patient may have very early diabetes that was exacerbated by steroid use.  The patient was asymptomatic with her hyperglycemia.  We will cover her with oral secretagogue while she continues to complete prednisone taper.  She will also be on a sugar free diet.  DISCHARGE PHYSICAL EXAMINATION:  VITAL SIGNS:  Afebrile at 97.5, blood pressure 140/98, respiratory rate 18, heart rate 70.  GENERAL:  Well-nourished woman in no acute distress.  CHEST:  The patient is moving air well with no rales, wheezes or rhonchi.  No sounds of infiltration.  CARDIOVASCULAR:  Unremarkable.  No further examination conducted.  DISCHARGE LABORATORY DATA AND X-RAY FINDINGS:  Hemoglobin A1C 6.8%.  Basic metabolic panel from December 26, 2000, with sodium 137, potassium 4.8, chloride 102, CO2 29, BUN 30, creatinine 0.8, glucose 348.  CBC from December 26, 2000, with hemoglobin 12.9, hematocrit 38.5, white count 16,800 with 86% segs, 12% lymphs, 2% monos.  Final chest x-ray is noted.  The patient had a lipid profile performed on December 25, 2000, with cholesterol of 194, triglycerides 331, HDL 27, LDL 101.  Cardiac enzymes with initial CK of 791 with 9.3 MB fraction with a 1.2 relative index with a  troponin I of 0.29.  Subsequent CKs were 891 and then 668.  Troponin peaked at 0.34 and was not rechecked.  DISCHARGE MEDICATIONS: 1. Prempro and Fosamax will be resumed home medications. 2. Biaxin XL 1000 mg daily for three additional days. 3. Zyprexa 2.5 mg q.h.s. until followup office visit. 4. Altace 5 mg daily for blood pressure control. 5. Aspirin 81 mg daily. 6. Prednisone 20 mg b.i.d. x 3 days, 20 mg q.a.m. x 3 days, 10 mg q.d. x 6    days. 7. Amaryl 2 mg daily with office followup. 8. Serevent diskus one inhalation in a.m. and h.s.  DIET:  No sweets, no added sugar diet.  The patient is advised it may take several weeks to regain full energy.  SPECIAL INSTRUCTIONS:  Call for any wheezing or shortness of breath.  She should call for any episodes of being cold, clammy and weak consistent with hypoglycemia.  She may continue to use over-the-counter Robitussin DM for cough.  FOLLOWUP:  At the patients request, she will see Dr. Debby Bud on Monday, April 15, for followup at which time CBG will be checked.  Pulmonary function testing will be scheduled for later in May or early June.  The patient will be given pneumonia vaccine if she is medically stable at that time.  CONDITION ON DISCHARGE:  Stable and improved. DD:  12/26/00 TD:  12/26/00 Job: [id number]

## 2011-02-04 NOTE — Op Note (Signed)
[name], Lori [name] NO.:  [id number]   MEDICAL RECORD NO.:  [id number]          PATIENT TYPE:  OIB   LOCATION:  0098                         FACILITY:  [facility]   PHYSICIAN:  Georges Lynch. Gioffre, M.D.DATE OF BIRTH:  02-27-1926   DATE OF PROCEDURE:  07/12/2006  DATE OF DISCHARGE:  07/13/2006                                 OPERATIVE REPORT   SURGEON:  Windy Fast A. Darrelyn Hillock, M.D.   OPERATIONS:  Arlyn Leak, PA   PREOPERATIVE DIAGNOSES:  1. Complete rotator cuff tear with retraction, right shoulder.  2. Severe impingement syndrome right shoulder.   POSTOPERATIVE DIAGNOSES:  1. Complete rotator cuff tear with retraction, right shoulder.  2. Severe impingement syndrome right shoulder.   OPERATION:  1. Partial acromionectomy with acromioplasty. right shoulder.  2. Repair of rotator cuff tendon, right shoulder utilizing two 4-pronged      Mitek anchors and a Restore tendon graft.   PROCEDURE:  Prior to general anesthesia, she had a interscalene nerve block  on the right by the anesthesiologist.  She was taken back to surgery, given  a general anesthetic, routine orthopedic prep and drape of the right  shoulder was carried out.  She had 1 gram of IV Ancef.  At this time  incision was made over the anterior aspect of the acromion and proximal  deltoid.  I then separated the partially separated deltoid tendon from the  acromion.  She had severe impingement.  I protected the underlying rotator  cuff, utilized the oscillating saw and the bur to do a partial  acromionectomy.  Following that I noted that the cuff was completely torn  and was retracted laterally.  I brought the two tendon ends together and  utilized the long head of the biceps to reinforce the repair.  We repaired  the tendon as well as we could and then with two Mitek anchors in the  proximal humerus, we then reinforced it with the graft.  We used Restore  graft with an additional 4-pronged Mitek anchor.  We  had a nice repair.  We  thoroughly irrigated the area out.  We utilized thrombin soaked Gelfoam up  under the acromion for hemostasis.  We then utilized some local spray of  thrombin.  I then reapproximated deltoid tendon and muscle in usual fashion.  Remaining part of the wound was closed in the usual fashion.  Sterile  dressing was applied.  She was placed a shoulder immobilizer.           ______________________________  Georges Lynch Darrelyn Hillock, M.D.     RAG/MEDQ  D:  07/12/2006  T:  07/13/2006  Job:  [id number]

## 2011-02-04 NOTE — Cardiovascular Report (Signed)
NAME:  Lori [name], Lori [name]                            ACCOUNT NO.:  [id number]   MEDICAL RECORD NO.:  [id number]                   PATIENT TYPE:  OIB   LOCATION:  2899                                 FACILITY:  [facility]   PHYSICIAN:  Peter M. Swaziland, M.D.               DATE OF BIRTH:  [birth year]   DATE OF PROCEDURE:  03/09/2004  DATE OF DISCHARGE:  03/09/2004                              CARDIAC CATHETERIZATION   PROCEDURE:  Left heart catheterization, coronary and left ventricular  angiography.   CARDIOLOGIST:  Peter M. Swaziland, M.D.   INDICATIONS:  This is a 77 year old white female with a history of  hypertension who presents with refractory dental pain.  It is relieved with  nitroglycerin.  She has a history of hyperlipidemia.   ACCESS:  Via the right femoral artery using the standard Seldinger  technique.   EQUIPMENT:  6 French 4 cm right and left Judkins catheter, 6 French pigtail  catheter, 6 French arterial sheath.   MEDICATIONS:  Local anesthesia with 1% Xylocaine.   CONTRAST:  100 mL of Omnipaque.   CLOSURE:  Femoral site was closed using an AngioSeal device with excellent  hemostasis.   HEMODYNAMIC DATA:  Aortic pressure is 190/83 with a mean of 126.  Left  ventricular pressure is 187 with an end-diastolic pressure of 14 mmHg.   ANGIOGRAPHIC DATA:  1. The left coronary artery arises and distributes normally.  2. The left main coronary artery is normal.  3. The left anterior descending artery is normal.  4. The left circumflex coronary artery has minor irregularities in the     distal vessel, less than 10%.  5. The right coronary artery is a large dominant vessel which has a focal 20-     30% narrowing in the proximal vessel.  Otherwise, no significant coronary     disease.   LEFT VENTRICULAR ANGIOGRAPHY:  The left ventricular angiography demonstrates  normal left ventricular size and contractility with normal systolic  function.  The ejection fraction is estimated at  55%.   FINAL INTERPRETATION:  1. No significant atherosclerotic coronary artery disease.  2. Normal left ventricular function.                                               Peter M. Swaziland, M.D.    PMJ/MEDQ  D:  03/09/2004  T:  03/10/2004  Job:  [id number]   cc:   Rosalyn Gess. Norins, M.D. [facility]   Cassell Clement, M.D.  [address], [address]  [place]  [place] [address]  Fax: [phone]

## 2011-02-04 NOTE — Assessment & Plan Note (Signed)
Lori [name]                           PRIMARY CARE OFFICE NOTE   Lori [name], Lori [name]                         MRN:          [id number]  DATE:12/13/2006                            DOB:          28-Jan-1926    [name] is a delightful 77 year old woman who I last saw in the  office on 11/06/06.   The patient has recently been treated for a urinary tract infection with  Macrodantin for frequency, urgency, small volumes, and dysuria. The  patient presented with left lower quadrant abdominal pain that was  persistent in nature with no fever. She was also having diarrhea, 6-8  loose stools a day with no blood or mucus. I was concerned that the  patient might have a diverticulosis versus stone. She was sent for CAT  scan of the abdomen and pelvis on 11/07/06 which revealed a hypodense  lesion in the left hepatic lobe, stable and compatible with a small  cyst; small hypodense lesions in both kidneys thought  to be non-  specific; several small left kidney upper pole parapelvic cysts;  thoracolumbar spondylosis and a cluster of small nodules in the lateral  basal segment of the left lower lobe statistically thought to be  inflammatory in nature. CAT scan of the pelvis revealed a small amount  of gas in the urinary bladder and the patient had no instrumentation of  the bladder.   The patient was subsequently referred to Dr. Darvin Neighbours because of air in  the bladder and was seen on 11/14/06. At that time, she was thought to  have pelvic pain and incontinence of stool and urine. It was thought  that pneumaturia was most likely from a urinary tract infection with gas  forming organism. She was treated with Cipro 750 mg nightly and was  referred to Dr. Kendrick Ranch for evaluation of rectal incontinence.   Dr. Earlene Plater saw the patient on 11/22/06 and the exam was notable for having  a perianal area that was inflamed and reddened with skin folds that were  thickened and  indurated, but no true hemorrhoids were noted. She was  then referred to Dr. Sheryn Bison for further evaluation of possible  diverticulitis and diarrhea. The patient by this time had also been  evaluated for C-diff which was negative and she had been treated with  antibiotics for possible diverticulitis. The patient came to colonoscopy  by Dr. Jarold Motto on 11/27/06. She was found to have colon polyps,  diverticulosis and a question of microscopic collagenous colitis. The  polyp was removed and multiple biopsies were performed. The patient also  had EGD performed on 11/27/06 which was normal except for a hiatal  hernia. Laboratory report returned as patient having an adenomatous  polyp. Random colon biopsy showed no significant inflammation or other  abnormality. A small bowel biopsy revealed no Giardia, no villus  atrophy, or other abnormality.   Subsequent to this, the patient developed a significant lower GI bleed  consistent with post-polypectomy bleed. She dropped her hemoglobin from  her baseline of 15 grams to a nadir of  9.5 grams. She was admitted to  the hospital by the GI service. She did not require transfusion. Her  evaluation was negative for ova and parasites. She was negative for C-  diff. Celiac panel was negative and sedimentation rate was normal. The  patient's hemoglobin at the time of discharge from the hospital was  still low at 9.5 grams. The patient did have repeat colonoscopy during  her hospital stay and was found to have an ulcer at the polypectomy site  and 2 endoclips were placed. Her examination was otherwise unremarkable.  CAT scan had been unrevealing. The patient did have a follow up CAT scan  on 12/06/06 which was read out as showing no significant abnormalities of  the colon except for the clips that were placed as noted. Of note, the  CAT scan did say that the lower lung fields were clear.   Since hospital discharge, the patient reports that she  continues to be  very weak. She has not been able to resume her normal physical activity.  She has had no further GI bleeding. Of note, the patient was put on an  iron product, Tandem, to help with iron replacement therapy for  correcting her anemia. She also, however, is on a low fiber, no salad,  no greens diet because of her recent polypectomy.   PHYSICAL EXAMINATION:  GENERAL:  In the office the patient appears well  groomed and in no distress.  VITAL SIGNS:  Temperature 97, blood pressure 116/68, pulse 76, weight  142.  CARDIOVASCULAR:  2+ radial pulse.  PULMONARY:  No increased work of breathing.  SKIN:  Color was normal.   ASSESSMENT:  Complicated course as noted above. Fortunately, the  patient's diarrhea has now stopped and she is taking no medication.  Definitive etiology for the patient's lower abdominal pain and diarrhea  are unclear, but her workup was negative. The patient's problem of air  in the bladder was benign and she was treated with Cipro for that  problem.   PLAN:  The patient is to return on Monday, March 30th, for a follow up  hemoglobin and hematocrit to see if her anemia is correcting. I have  reassured her that she should see improvement as her hemoglobin rises in  regards to a return to her normal strength and normal state of health.  She should be able to resume all of her normal activities.   The patient is asked to keep me posted as to her condition and is asked  to return to see me on an as needed basis or in three months for routine  follow up.     Lori Gess Norins, MD  Electronically Signed    MEN/MedQ  DD: 12/13/2006  DT: 12/14/2006  Job #: [id number]   cc:   Lori [name]

## 2011-02-06 NOTE — Progress Notes (Signed)
  Subjective:    Patient ID: Lori [name], female    DOB: 03/16/1925, 77 y.o.   MRN: [id number]  HPI Lori [name] has had persistent dysuria and urinary frequency. She recently had been treated with cipro but due to potential resistent infection was changed to ceftin. Her urine cultured did reveal e. Coli that was cipro resistent and sensitive to ceftriaxone. Despite treatment she continues to be symptomatic.   PMH, FamHx and SocHx reviewed for any changes and relevance.    Review of Systems Review of Systems  Constitutional:  Negative for fever, chills, activity change and unexpected weight change.  HENT:  Negative for hearing loss, ear pain, congestion, neck stiffness and postnasal drip.   Eyes: Negative for pain, discharge and visual disturbance.  Respiratory: Negative for chest tightness and wheezing.   Cardiovascular: Negative for chest pain and palpitations.       No decreased exercise tolerance Gastrointestinal: [No change in bowel habit. No bloating or gas. No reflux or indigestion Genitourinary: Positive for urgency, frequency, and difficulty urinating. No flank pain and no fever. Musculoskeletal: Negative for myalgias, back pain, arthralgias and gait problem.  Neurological: Negative for dizziness, tremors, weakness and headaches.  Hematological: Negative for adenopathy.  Psychiatric/Behavioral: Negative for behavioral problems and dysphoric mood.       Objective:   Physical Exam Vitals reviewedHEENT - nl    Pul - normal respirations Cor - RRR   Assessment & Plan:  1. UTI - patient with persistent symptoms  Plan - repeat U/A to test for cure and to culture if positive           She is to keep appointment with Dr. Lenoria Chime PA although a sooner appointment has been requested.  Addendum- U/A did return as postiive for WBCs and bacteria. Culture pending.

## 2011-02-08 ENCOUNTER — Telehealth: Payer: Self-pay | Admitting: *Deleted

## 2011-02-08 LAB — URINE CULTURE: Colony Count: 30000

## 2011-02-08 MED ORDER — AMOXICILLIN-POT CLAVULANATE 875-125 MG PO TABS: 1.0000 | ORAL_TABLET | Freq: Two times a day (BID) | ORAL | Status: DC

## 2011-02-08 MED ORDER — NITROFURANTOIN MONOHYD MACRO 100 MG PO CAPS: 100.0000 mg | ORAL_CAPSULE | Freq: Two times a day (BID) | ORAL | Status: AC

## 2011-02-08 NOTE — Telephone Encounter (Signed)
Patient requesting a call from Dr Debby Bud.

## 2011-02-08 NOTE — Telephone Encounter (Signed)
Per pharm and pt - she has allergy to pcn. Change to macrobid 100 mg bid x 7 days

## 2011-02-08 NOTE — Telephone Encounter (Signed)
Needs abx for positive u/a per MD. Pt aware of rx.

## 2011-02-09 NOTE — Telephone Encounter (Signed)
Called pt: reassured her about the choice of antibiotics. She does have GU appt tomorrow re: recurrent UTI.

## 2011-04-13 ENCOUNTER — Telehealth: Payer: Self-pay | Admitting: *Deleted

## 2011-04-13 MED ORDER — ZOLPIDEM TARTRATE 10 MG PO TABS: 10.0000 mg | ORAL_TABLET | Freq: Every evening | ORAL | Status: DC | PRN

## 2011-04-13 NOTE — Telephone Encounter (Signed)
okey dokey 

## 2011-04-13 NOTE — Telephone Encounter (Signed)
Called in.

## 2011-04-13 NOTE — Telephone Encounter (Signed)
Fax from [facility] [phone]. Zolpidem tartrate 10mg  take one tablet at bedtime Qty 90. Please advise refills

## 2011-04-25 ENCOUNTER — Ambulatory Visit (INDEPENDENT_AMBULATORY_CARE_PROVIDER_SITE_OTHER): Payer: 59 | Admitting: Internal Medicine

## 2011-04-25 VITALS — BP 120/60 | HR 99 | Temp 98.2°F | Wt 136.0 lb

## 2011-04-25 DIAGNOSIS — W19XXXA Unspecified fall, initial encounter: Secondary | ICD-10-CM

## 2011-04-25 DIAGNOSIS — Z9181 History of falling: Secondary | ICD-10-CM

## 2011-04-25 DIAGNOSIS — S0990XA Unspecified injury of head, initial encounter: Secondary | ICD-10-CM

## 2011-04-25 MED ORDER — ZOLPIDEM TARTRATE 10 MG PO TABS: 10.0000 mg | ORAL_TABLET | Freq: Every evening | ORAL | Status: DC | PRN

## 2011-04-25 MED ORDER — VALSARTAN-HYDROCHLOROTHIAZIDE 160-12.5 MG PO TABS: 1.0000 | ORAL_TABLET | Freq: Every day | ORAL | Status: DC

## 2011-04-25 MED ORDER — BUDESONIDE-FORMOTEROL FUMARATE 80-4.5 MCG/ACT IN AERO: 2.0000 | INHALATION_SPRAY | Freq: Two times a day (BID) | RESPIRATORY_TRACT | Status: DC

## 2011-04-25 MED ORDER — COLESEVELAM HCL 3.75 G PO PACK: 1.0000 | PACK | ORAL | Status: DC

## 2011-04-25 NOTE — Progress Notes (Signed)
  Subjective:    Patient ID: Lori [name], female    DOB: 03/16/1925, 78 y.o.   MRN: [id number]  HPI Lori [name] reports that last wednseday she got up to the bathroom at 0200 . She fell and had loss of consciousness. She sustained a hematoma of the scalp and a small laceration to the right elbow, bruising of the right hip but she has no limitation in walking or function. She is disturbed because she has no memory from the time of the fall to the morning hours. She does admit that she tripped on a throw rug in the bathroom. She has otherwise been doing well. She is in the process of packing to move to [place]. [place]   PMH, FamHx and SocHx reviewed for any changes and relevance.    Review of Systems System review is negative for any constitutional, cardiac, pulmonary, GI or neuro symptoms or complaints     Objective:   Physical Exam Vitals noted. Gen'l - well groomed older woman in no distress HEENT - C&S clear Cor - RRR PUlm - mild SOB at rest and with speech. Neur- A&O x 3, CN II-XII intact with PERRLA EOMI, normal MS, normal gait, normal mental function at this time MSK - able to cross her legs, stand without assistance, normal gait.        Assessment & Plan:  Fall with LOC - possible minor concussion from her fall but her exam today is normal. She has no change in cognitive function, no headache and neuro exam is normal. The timing of her ambien administration and this fall and her memory of going to the bathroom make ambien related sleep walking unlikely. She may continue with this medication.  Plan - reduce fall risk int he home: remove throw rugs, good lighting when getting up in the night.

## 2011-05-20 ENCOUNTER — Encounter: Payer: Self-pay | Admitting: Internal Medicine

## 2011-05-20 ENCOUNTER — Ambulatory Visit (INDEPENDENT_AMBULATORY_CARE_PROVIDER_SITE_OTHER): Payer: 59 | Admitting: Internal Medicine

## 2011-05-20 DIAGNOSIS — R079 Chest pain, unspecified: Secondary | ICD-10-CM

## 2011-05-20 DIAGNOSIS — I10 Essential (primary) hypertension: Secondary | ICD-10-CM

## 2011-05-20 MED ORDER — NITROGLYCERIN 0.4 MG SL SUBL: 0.4000 mg | SUBLINGUAL_TABLET | SUBLINGUAL | Status: DC | PRN

## 2011-05-20 NOTE — Patient Instructions (Signed)
Evaluation at [facility] was negative for any sign of coronary blockage. If you have recurrent serious chest pressure you will need to have more urgent evaluation = readmission to hospital for further cardiology evaluation and possibly a cardiac catherization. Also, if you have serious recurrent chest pressure you should take nitroglycerine as you get ready to go to the hospital. If you have a chance call me so that I can alert the [facility] Emergency department and the [facility] Cardiologist on call. You are scheduled to see Dr. Marca Ancona, Monday, September 10th at 12: 15 PM.  Try to get some rest. Continue all your present medications. It is ok to take the tylenol for headache and if unrelieved you may take the naproxen.

## 2011-05-22 ENCOUNTER — Encounter: Payer: Self-pay | Admitting: Internal Medicine

## 2011-05-22 NOTE — Assessment & Plan Note (Signed)
Patient with episode of chest pressure, diaphoresis, weakness with a negative work-up at outside hospital. She is still being evaluated for any possible arrythmia.  Plan - she is to continue present medications including daily ASA           Appointment scheduled with Dr. Shirlee Latch for September 10th - patient aware of date and time           She is carefully instructed to go to [facility] hospital for any recurrent chest pressure, diaphoresis, increased weakness or shortness of breath.

## 2011-05-22 NOTE — Assessment & Plan Note (Signed)
BP Readings from Last 3 Encounters:  05/20/11 138/86  04/25/11 120/60  02/04/11 138/66   Adequate control of BP on present regimen.

## 2011-05-22 NOTE — Progress Notes (Signed)
Subjective:    Patient ID: Lori [name], female    DOB: 03/16/1925, 77 y.o.   MRN: [id number]  HPI Lori [name] presents for hospital follow-up. 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 She went home but the next day she was feeling bad with weakness and chest pressure. She went to a local facility in [facility] and was transported by EMS to [facility] where she was admitted. Records reviewed: she had negative cardiac enzymes, full evaluation by cardiologist, underwent nuclear cardiac stress test that was negative for obstructive disease. She was fitted with an event recorder - associated with the [facility] in [place] - due to concern for arrythmia as possible source of her episode of diaphoresis and pallor on the 26th. In the office she denies active chest pain but continues to feel weak and washout and has had occasional chest pain. She reports that she did get relief from her symptoms of chest pain August 27th with sl nitroglycerine. She does appear to be stable but she is worried.   Past Medical History  Diagnosis Date  . COPD (chronic obstructive pulmonary disease)   . Asthma   . ALLERGIC RHINITIS   . Hypertension   . Pleural effusion, left   . Colles' fracture of left radius   . Anxiety   . Back pain of thoracolumbar region     right  . Chest pain, unspecified   . Pruritus   . Diastolic dysfunction   . Dyspnea on exertion   . Pulmonary nodule   . Upper respiratory infection   . IBS (irritable bowel syndrome)   . Cramp of limb   . Bronchiectasis   . Insomnia   . History of pneumonia   . Diverticulosis   . Hx of colonic polyp   . Dyspnea   . Multiple rib fractures 10/2008    bilateral   Past Surgical History  Procedure Date  . Tonsillectomy   . Appendectomy   . Cholecystectomy   . Polypectomy   . Bladder suspension 2005    A-P  . Rotator cuff repair 2006  . Orif wrist fracture 2010    left wrist.  Dr. Mina Marble.  .  Total knee arthroplasty 08/2010   Family History  Problem Relation Age of Onset  . Heart disease Mother   . Colon cancer Neg Hx   . Cancer Neg Hx     colon  . Bipolar disorder Daughter    History   Social History  . Marital Status: Married    Spouse Name: N/A    Number of Children: 3  . Years of Education: N/A   Occupational History  .     Social History Main Topics  . Smoking status: Former Smoker -- 0.3 packs/day for 10 years    Types: Cigarettes    Quit date: 09/19/1968  . Smokeless tobacco: Not on file  . Alcohol Use: No  . Drug Use: Not on file  . Sexually Active: Not on file   Other Topics Concern  . Not on file   Social History Narrative   College - [address], [place] - [place] artMarried [id number] - spouse s/p valve surgery with reslting paralysis hemidiaphragm ('10)3 daughter, 5 grandsons, 4 granddaughters.  1 daughter bipolar - social issuesRemains very active, but can't play tennisPt was apprently ex-smoker, but husband does not recollect pt ever smokingStressful move to smaller quarters (fall '09)       Review  of Systems Review of Systems  Constitutional:  Negative for fever, chills, activity change and unexpected weight change.  HEENT:  Negative for hearing loss, ear pain, congestion, neck stiffness and postnasal drip. Negative for sore throat or swallowing problems. Negative for dental complaints.   Eyes: Negative for vision loss or change in visual acuity.  Respiratory: Negative for chest tightness and wheezing.   Cardiovascular: Positive for chest pain and palpitation.Positive for decreased exercise tolerance Gastrointestinal: No change in bowel habit. No bloating or gas. No reflux or indigestion Genitourinary: Negative for urgency, frequency, flank pain and difficulty urinating.  Musculoskeletal: Negative for myalgias, back pain, arthralgias and gait problem.  Neurological: Negative for dizziness, tremors, weakness and headaches.  Hematological: Negative  for adenopathy.  Psychiatric/Behavioral: Negative for behavioral problems and dysphoric mood.       Objective:   Physical Exam Vital reviewed - stable Gen'l - WNWD well preserved and nicely groomed white woman who is worried but in no distress HEENT - C&S clear Respirations - unlabored with no rales or wheeze Cor - 2+ radial pulse, RRR        Assessment & Plan:

## 2011-05-24 NOTE — Progress Notes (Signed)
Lori [name], see if we can get this patient in a bit sooner given recent hospitalization.

## 2011-05-25 ENCOUNTER — Encounter: Payer: Self-pay | Admitting: Cardiology

## 2011-05-30 ENCOUNTER — Institutional Professional Consult (permissible substitution): Payer: 59 | Admitting: Cardiology

## 2011-06-02 ENCOUNTER — Telehealth: Payer: Self-pay | Admitting: Internal Medicine

## 2011-06-02 NOTE — Telephone Encounter (Signed)
Patient calling to report severe diarrhea up to 20 loose to watery stools/day. Also has rectal bleeding when she wipes she sees bright red blood on tissue. States she is moving also. She states this is not like her previous IBS episodes. She has tried Imodium without relief. Patient wants to see Dr. Juanda Chance. Explained to her that Dr. Juanda Chance is not in the office this week but I can schedule her with our extender. Patient agreed to this. Scheduled patient with Mike Gip, PA on 06/03/11 at 2:00 PM (per patient request needs afternoon)

## 2011-06-03 ENCOUNTER — Other Ambulatory Visit (INDEPENDENT_AMBULATORY_CARE_PROVIDER_SITE_OTHER): Payer: 59

## 2011-06-03 ENCOUNTER — Ambulatory Visit (INDEPENDENT_AMBULATORY_CARE_PROVIDER_SITE_OTHER): Payer: 59 | Admitting: Physician Assistant

## 2011-06-03 ENCOUNTER — Encounter: Payer: Self-pay | Admitting: Physician Assistant

## 2011-06-03 VITALS — BP 120/60 | HR 80 | Ht 65.0 in | Wt 135.0 lb

## 2011-06-03 DIAGNOSIS — R197 Diarrhea, unspecified: Secondary | ICD-10-CM

## 2011-06-03 DIAGNOSIS — Z8719 Personal history of other diseases of the digestive system: Secondary | ICD-10-CM

## 2011-06-03 LAB — CBC WITH DIFFERENTIAL/PLATELET
Basophils Relative: 1 % (ref 0.0–3.0)
Eosinophils Absolute: 0.2 10*3/uL (ref 0.0–0.7)
Eosinophils Relative: 2.7 % (ref 0.0–5.0)
HCT: 41.8 % (ref 36.0–46.0)
Lymphs Abs: 1.9 10*3/uL (ref 0.7–4.0)
MCHC: 33.5 g/dL (ref 30.0–36.0)
MCV: 86.6 fl (ref 78.0–100.0)
Monocytes Absolute: 0.3 10*3/uL (ref 0.1–1.0)
Platelets: 249 10*3/uL (ref 150.0–400.0)
WBC: 7.2 10*3/uL (ref 4.5–10.5)

## 2011-06-03 MED ORDER — ZOLPIDEM TARTRATE 10 MG PO TABS: 10.0000 mg | ORAL_TABLET | Freq: Every evening | ORAL | Status: DC | PRN

## 2011-06-03 MED ORDER — LOPERAMIDE HCL 1 MG/7.5ML PO LIQD: ORAL | Status: DC

## 2011-06-03 MED ORDER — DICYCLOMINE HCL 10 MG PO CAPS: ORAL_CAPSULE | ORAL | Status: DC

## 2011-06-03 NOTE — Patient Instructions (Addendum)
Please go to the basement today before leaving to have your labs drawn. Your prescriptions have been sent to your pharmacy for you to pick up. Please get Imodium over the counter and take as directed.

## 2011-06-03 NOTE — Progress Notes (Signed)
Subjective:    Patient ID: Lori [name], female    DOB: 03/16/1925, 77 y.o.   MRN: [id number]  HPI Lori [name] is a very nice 77 year old white female known to Dr. Jarold Motto and also to Dr. Juanda Chance. She has history of colon polyps, and I required hospitalization in 2008 with a post polypectomy bleed. She also has history of IBS. She reports that she has been under a tremendous amount of stress over the past month as she and her husband are in the process of moving into well [facility]  [facility] facility and she has been organizing the move. She was hospitalized for 2 days at [facility] at the beginning of September with an episode of chest pressure and diaphoresis but apparently has had negative cardiac evaluation and has not had any recurrent symptoms. She says she's been working many hours a day packing up her home and started about 3 days ago with diarrhea. She says she has had difficulty with urgency for the past few years but has been having episodes of incontinence over the past couple of days and up to 20 bowel movements per day of loose runny stool. She's not had any nocturnal episodes denies any fever or chills, has not had any nausea or vomiting, her appetite has been fair. She describes some mild abdominal discomfort but no pain. She's not  had any known infectious exposures, no other family members ill,  no new medications ,and no recent antibiotics.  She is actually better today and has only had 2 bowel movements thus far with no incontinence.    Review of Systems  Constitutional: Positive for appetite change.  HENT: Negative.   Eyes: Negative.   Respiratory: Negative.   Cardiovascular: Negative.   Gastrointestinal: Positive for diarrhea and anal bleeding.  Genitourinary: Negative.   Musculoskeletal: Negative.   Skin: Negative.   Neurological: Positive for weakness.  Psychiatric/Behavioral: The patient is nervous/anxious.    Outpatient Prescriptions Prior to Visit  Medication  Sig Dispense Refill  . acetaminophen (TYLENOL) 500 MG tablet Take 500 mg by mouth 2 (two) times daily as needed.        Marland Kitchen aspirin 325 MG tablet Take 325 mg by mouth daily.        . Bimatoprost (LUMIGAN) 0.01 % SOLN Place 1 drop into both eyes daily.        . budesonide-formoterol (SYMBICORT) 80-4.5 MCG/ACT inhaler Inhale 2 puffs into the lungs 2 (two) times daily.  3 Inhaler  6  . Calcium Carbonate-Vitamin D (CALCIUM-VITAMIN D) 600-200 MG-UNIT CAPS Take 3 tablets by mouth daily.        . Colesevelam HCl (WELCHOL) 3.75 G PACK Take 1 each by mouth 1 day or 1 dose.  90 each  3  . esomeprazole (NEXIUM) 40 MG capsule Take 1 capsule (40 mg total) by mouth daily.  30 capsule  3  . hydrocortisone-pramoxine (ANALPRAM-HC) 2.5-1 % rectal cream Place rectally 3 (three) times daily as needed.        . Multiple Vitamin (MULTIVITAMIN) tablet Take by mouth daily.        . naproxen sodium (ANAPROX) 220 MG tablet Take 220 mg by mouth 2 (two) times daily as needed.        . nitrofurantoin (MACRODANTIN) 50 MG capsule Take 50 mg by mouth daily.        . nitroGLYCERIN (NITROSTAT) 0.4 MG SL tablet Place 1 tablet (0.4 mg total) under the tongue every 5 (five) minutes as needed for chest  pain.  100 tablet  3  . valsartan-hydrochlorothiazide (DIOVAN-HCT) 160-12.5 MG per tablet Take 1 tablet by mouth daily.  90 tablet  3  . zolpidem (AMBIEN) 10 MG tablet Take 1 tablet (10 mg total) by mouth at bedtime as needed.  90 tablet  1  . cloNIDine (CATAPRES) 0.1 MG tablet 1 by mouth every 4 hours as needed systolic blood pressure greater than 170       . diclofenac sodium (VOLTAREN) 1 % GEL Apply topically. Apply 4 g to affected knee 4 times a day        Allergies  Allergen Reactions  . Amoxicillin     REACTION: unspecified  . Penicillins     REACTION: causes hives  . Sulfonamide Derivatives        Objective:   Physical Exam Well-developed elderly white female in no acute distress, pleasant, alert and oriented x3 blood  pressure 120/60 pulse 86, HEENT; nontraumatic normocephalic EOMI PERRLA sclera anicteric, Neck; Supple no JVD, Cardiovascula;r regular rate and rhythm with S1-S2 no murmur gallop Pulmonary; clear bilaterally, Abdomen; soft, nondistended bowel sounds somewhat hyperactive nontender, no palpable mass, or hepatosplenomegaly, Rectal; decreased sphincter tone, very loose stool in the rectal vault ,Hemoccult negative no hemorrhoids appreciated, Extremities, no clubbing cyanosis or edema skin warm and dry, Psych; mood and affect normal an appropriate.        Assessment & Plan:  #69 77 year old white female with acute diarrhea her diarrheal illness x3 days, improving over the past 24 hours. Rule out acute infectious gastroenteritis, rule out possible C. difficile colitis with recent hospitalization, rule out exacerbation of IBS though the severity of the diarrhea seems out of proportion.  Plan; we'll check CBC and CMET, and stool for C. difficile PCR Low residue diet but liberal fluids May use Imodium as needed and suggested she take one each morning over the weekend. Trial of Bentyl 10 mg once or twice daily as needed for urgency refill and begin 10 mg each bedtime, at her request but she has been on long-term  #2 History of adenomatous colon polyps last colonoscopy 2008 Plan followup at five-year interval, patient would like to have followup colonoscopy with Dr. Lina Sar and would like to pursue this in her given increase in her urgency over the past year. She will call back to schedule this once she is settled in at her new home next  #3 Diverticulosis

## 2011-06-05 NOTE — Telephone Encounter (Signed)
Reviewed and agree. She will be checked for microscopic colitis. Her most likely diagnosis over last  Many years has been IBS

## 2011-06-06 ENCOUNTER — Telehealth: Payer: Self-pay | Admitting: *Deleted

## 2011-06-06 ENCOUNTER — Other Ambulatory Visit: Payer: 59

## 2011-06-06 DIAGNOSIS — R197 Diarrhea, unspecified: Secondary | ICD-10-CM

## 2011-06-06 LAB — BASIC METABOLIC PANEL
BUN: 23 mg/dL (ref 6–23)
Calcium: 9.3 mg/dL (ref 8.4–10.5)
GFR: 92.25 mL/min (ref >60.00)
Glucose, Bld: 139 mg/dL — ABNORMAL HIGH (ref 70–99)
Sodium: 141 mEq/L (ref 135–145)

## 2011-06-06 NOTE — Telephone Encounter (Signed)
Message copied by Daphine Deutscher on Mon Jun 06, 2011  3:46 PM ------      Message from: [place], [place] S      Created: Mon Jun 06, 2011  3:33 PM       Please let Yaresly know her labs are normal-see how she is feeling,thanks

## 2011-06-06 NOTE — Telephone Encounter (Signed)
No answer and unable to leave a message. Will try again.

## 2011-06-06 NOTE — Progress Notes (Signed)
Agree with initial assessment and plans 

## 2011-06-07 LAB — CLOSTRIDIUM DIFFICILE BY PCR: Toxigenic C. Difficile by PCR: NOT DETECTED

## 2011-06-07 NOTE — Telephone Encounter (Signed)
Unable to reach patient or leave a message because voice mail box is full

## 2011-06-07 NOTE — Telephone Encounter (Signed)
Patient given results. She states she is much better.

## 2011-06-08 ENCOUNTER — Telehealth: Payer: Self-pay | Admitting: *Deleted

## 2011-06-08 NOTE — Telephone Encounter (Signed)
Message copied by Daphine Deutscher on Wed Jun 08, 2011 11:58 AM ------      Message from: [place], [place] S      Created: Wed Jun 08, 2011 11:54 AM       Please let pt know final culture on stool for cdiff is negative, make sure she is better.

## 2011-06-08 NOTE — Telephone Encounter (Signed)
Patient notified of results.  She is feeling much better.

## 2011-07-05 ENCOUNTER — Ambulatory Visit (INDEPENDENT_AMBULATORY_CARE_PROVIDER_SITE_OTHER): Payer: 59 | Admitting: Internal Medicine

## 2011-07-05 VITALS — BP 144/84 | HR 74 | Temp 97.0°F | Wt 137.0 lb

## 2011-07-05 DIAGNOSIS — G44209 Tension-type headache, unspecified, not intractable: Secondary | ICD-10-CM

## 2011-07-05 MED ORDER — ZOLPIDEM TARTRATE 10 MG PO TABS: 10.0000 mg | ORAL_TABLET | Freq: Every evening | ORAL | Status: DC | PRN

## 2011-07-05 NOTE — Progress Notes (Signed)
  Subjective:    Patient ID: Lori [name], female    DOB: 03/16/1925, 77 y.o.   MRN: [id number]  HPI Lori [name] presents due to persistent frontal headache for the past week. She was recently seen by Dr. [place] and received botox injections along the frontal area at the hairline. She dates the onset of her headache to soon after treatment. She has had no associated symptoms: no double vision, paresthesia, facial droop, N/V. She does ptosis of both upper lids which was preexisting for which she is considering surgery.   She will be seeing Dr. Shirlee Latch tomorrow for follow-up of her [place]. Reviewed the nuclear stress study from [facility] - negative for ischemia. She has not had any recurrent chest pain.  She has made a successful move to [facility] - to a free standing [place]. She is happy with the move. She is swimming every day and using the gym.  I have reviewed the patient's medical history in detail and updated the computerized patient record.    Review of Systems System review is negative for any constitutional, cardiac, pulmonary, GI or neuro symptoms or complaints other than as described in the HPI.     Objective:   Physical Exam Vitals - stable BP Gen'l - WNWD, well groomed white woman looking younger than her age in no distress HEENT- eyelids do cover the top of the iris both eyes. No sign of trauma. Cor - RRR Pulm - no increased work of breathing.        Assessment & Plan:  Headache - across the forehead c/w muscle tension type headache which is relieved with advil. Cannot tell her if this is related to botox injections.  Plan - trial of celebrex 200mg  once a day as safer alternative to advil.

## 2011-07-06 ENCOUNTER — Ambulatory Visit (HOSPITAL_COMMUNITY): Payer: 59 | Attending: Cardiology | Admitting: Radiology

## 2011-07-06 ENCOUNTER — Encounter: Payer: Self-pay | Admitting: Cardiology

## 2011-07-06 ENCOUNTER — Ambulatory Visit (INDEPENDENT_AMBULATORY_CARE_PROVIDER_SITE_OTHER): Payer: 59 | Admitting: Cardiology

## 2011-07-06 DIAGNOSIS — E785 Hyperlipidemia, unspecified: Secondary | ICD-10-CM

## 2011-07-06 DIAGNOSIS — R072 Precordial pain: Secondary | ICD-10-CM | POA: Insufficient documentation

## 2011-07-06 DIAGNOSIS — R42 Dizziness and giddiness: Secondary | ICD-10-CM

## 2011-07-06 DIAGNOSIS — I10 Essential (primary) hypertension: Secondary | ICD-10-CM

## 2011-07-06 DIAGNOSIS — I2510 Atherosclerotic heart disease of native coronary artery without angina pectoris: Secondary | ICD-10-CM | POA: Insufficient documentation

## 2011-07-06 DIAGNOSIS — R079 Chest pain, unspecified: Secondary | ICD-10-CM

## 2011-07-06 DIAGNOSIS — E7800 Pure hypercholesterolemia, unspecified: Secondary | ICD-10-CM

## 2011-07-06 DIAGNOSIS — I059 Rheumatic mitral valve disease, unspecified: Secondary | ICD-10-CM | POA: Insufficient documentation

## 2011-07-06 LAB — LDL CHOLESTEROL, DIRECT: Direct LDL: 167.9 mg/dL

## 2011-07-06 NOTE — Patient Instructions (Signed)
Decrease aspirin to 81mg  daily. This should be enteric coated.  Your physician recommends that you have a FASTING lipid profile today 786.50  272.0    Your physician has requested that you have an echocardiogram. Echocardiography is a painless test that uses sound waves to create images of your heart. It provides your doctor with information about the size and shape of your heart and how well your heart's chambers and valves are working. This procedure takes approximately one hour. There are no restrictions for this procedure.  Your physician recommends that you schedule a follow-up appointment in: 2 months with Dr Shirlee Latch.

## 2011-07-07 DIAGNOSIS — E785 Hyperlipidemia, unspecified: Secondary | ICD-10-CM | POA: Insufficient documentation

## 2011-07-07 NOTE — Assessment & Plan Note (Signed)
Patient has history of atypical chest pain.  [place] with [place] in [place].  Nuclear stress test there showed no evidence for ischemia or infarction.  She has had no symptoms since that time.  She has good exercise tolerance.  She can decrease her aspirin to 81 mg daily.  I will have her get an echo in our office.

## 2011-07-07 NOTE — Assessment & Plan Note (Signed)
BP seems to be controlled on current medication regimen.

## 2011-07-07 NOTE — Assessment & Plan Note (Signed)
The episode of lightheadedness and diaphoresis that Mrs Lori [name] had on 8/26 may have been vagal.  She had been on her feet a lot that day and had not eaten or drunk much.  She has had no recurrence of symptoms and event monitor was normal per her report.

## 2011-07-07 NOTE — Assessment & Plan Note (Signed)
Patient had myalgias with Lipitor.  She is not actually taking Welchol.  I will get lipids as they have not been done recently.
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
## 2011-07-11 ENCOUNTER — Encounter: Payer: Self-pay | Admitting: *Deleted

## 2011-07-11 ENCOUNTER — Other Ambulatory Visit: Payer: Self-pay

## 2011-07-11 MED ORDER — CO-ENZYME Q-10 50 MG PO CAPS: 100.0000 mg | ORAL_CAPSULE | Freq: Two times a day (BID) | ORAL | Status: DC

## 2011-07-11 MED ORDER — ROSUVASTATIN CALCIUM 5 MG PO TABS: 5.0000 mg | ORAL_TABLET | Freq: Every day | ORAL | Status: DC

## 2011-07-28 ENCOUNTER — Ambulatory Visit (INDEPENDENT_AMBULATORY_CARE_PROVIDER_SITE_OTHER): Payer: 59 | Admitting: Internal Medicine

## 2011-07-28 ENCOUNTER — Ambulatory Visit (INDEPENDENT_AMBULATORY_CARE_PROVIDER_SITE_OTHER)
Admission: RE | Admit: 2011-07-28 | Discharge: 2011-07-28 | Disposition: A | Discharge status: Home / Self Care | Payer: 59 | Source: Ambulatory Visit | Attending: Internal Medicine | Admitting: Internal Medicine

## 2011-07-28 VITALS — BP 138/64 | HR 76 | Temp 97.4°F | Wt 137.0 lb

## 2011-07-28 DIAGNOSIS — M545 Low back pain, unspecified: Secondary | ICD-10-CM

## 2011-07-28 MED ORDER — CELECOXIB 100 MG PO CAPS: 100.0000 mg | ORAL_CAPSULE | Freq: Two times a day (BID) | ORAL | Status: DC

## 2011-07-28 NOTE — Patient Instructions (Signed)
Low back pain - no evidence of a herniated disk or pinched nerve. Suspect compression fracture vs degenerative joint disease of the spine.   Plan - x-rays today: lumbar spine           celebrex 100 mg AM and PM, do NOT take other anti-inflammatory drugs, e.g. Aleve           May take tylenol 500 mg 2 tablets three times a day           Heat may help - you can get adhesive heat patches at the drugstore.  Headache - Dr. Brion Aliment has been treating you for sinus infection as cause of pain.  Plan- for pressure and headache - take sudafed 30 mg twice or three times a day.

## 2011-07-29 MED ORDER — CYCLOBENZAPRINE HCL 5 MG PO TABS: 5.0000 mg | ORAL_TABLET | Freq: Three times a day (TID) | ORAL | Status: DC | PRN

## 2011-07-29 NOTE — Progress Notes (Signed)
  Subjective:    Patient ID: Lori [name], female    DOB: 03/16/1925, 77 y.o.   MRN: [id number]  HPI Lori [name] presents for acute low back pain. She has had several days of increasing discomfort in her low back to the degree that it limits her activities. Today the pain became much worse so that rising from seated position is painful as is walking. She denies loss of sensation, any increased incontinence. She does not recall any strain or injury.  She has persistent headache frontal and maxillary sinus areas. She has been seeing Dr. Brion Aliment - treated with antibiotics x 2 for sinus infection. She had an MRI, report not available to me, that she reports revealed significant sinus disease.  I have reviewed the patient's medical history in detail and updated the computerized patient record.    Review of Systems System review is negative for any constitutional, cardiac, pulmonary, GI or neuro symptoms or complaints other than as described in the HPI.     Objective:   Physical Exam Vitals noted - no fever Gen'l- WNWD, well groomed white woman HEENT- mild sinus tenderness Pul - rhonchi and mild wheezing in all fields, no increased WOB Cor- RRR Back - Back exam: normal stand-w/o assist but with pain; normal flex to greater than 100 degrees; toe walk - painful, heel walk OK; normal step up to exam table - with discomfort; normal SLR sitting; normal DTRs at the patellar tendons; normal sensation to light touch, pin-prick and deep vibratory stimulus; no  CVA tenderness; able to move supine to sitting witout assistance.  Clinical Data: Acute low back pain. Rule out compression fracture  or degenerative disc disease. No known injury.  LUMBAR SPINE - 2-3 VIEW  Comparison: 06/24/2008  Findings: There are five lumbar type vertebral bodies identified.  Vertebral body heights appear maintained. Disc space narrowing is  noted at the L3-4, L4-5 and L5-S1 levels and has increased slightly  at all three  levels since the previous exam in 2009. Bony  alignment remains intact with no spondylolisthesis noted.  Degenerative osteophytosis is identified at multiple levels and  appears unchanged in comparison with the prior exam.  The sacral white lines appear maintained and the sacroiliac joints  are intact.  Surgical clips are seen in the right upper quadrant.  IMPRESSION:  Degenerative changes of the lumbar spine as noted above with  interval slight increase in disc space narrowing at the lower  lumbar sacral levels. No acute findings are identified to correlate  with the patient's acute low back pain.  Original Report Authenticated By: Bertha Stakes, M.D.         Assessment & Plan:  Acute back pain - no evidence of radiculopathy on exam. Imaging without DDD of significance, DJD noted, no compression fracture noted.  Plan - use of heat patch to low back            Muscle relaxant - flexeril 5 mg tid as needed           NSAID - Rx for celebrex 100 mg bid chosen due to h/o GI irritation           Gentle stretching once acute pain improves.

## 2011-08-29 ENCOUNTER — Encounter: Payer: Self-pay | Admitting: Cardiology

## 2011-08-29 ENCOUNTER — Ambulatory Visit (INDEPENDENT_AMBULATORY_CARE_PROVIDER_SITE_OTHER): Payer: 59 | Admitting: Cardiology

## 2011-08-29 VITALS — BP 145/78 | HR 88 | Ht 65.0 in | Wt 136.0 lb

## 2011-08-29 DIAGNOSIS — F411 Generalized anxiety disorder: Secondary | ICD-10-CM

## 2011-08-29 DIAGNOSIS — E785 Hyperlipidemia, unspecified: Secondary | ICD-10-CM

## 2011-08-29 DIAGNOSIS — E7800 Pure hypercholesterolemia, unspecified: Secondary | ICD-10-CM

## 2011-08-29 MED ORDER — NITROGLYCERIN 0.4 MG SL SUBL: 0.4000 mg | SUBLINGUAL_TABLET | SUBLINGUAL | Status: DC | PRN

## 2011-08-29 MED ORDER — ROSUVASTATIN CALCIUM 5 MG PO TABS: ORAL_TABLET | ORAL | Status: DC

## 2011-08-29 MED ORDER — ALPRAZOLAM 0.25 MG PO TABS: ORAL_TABLET | ORAL | Status: DC
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
## 2011-08-29 NOTE — Patient Instructions (Signed)
Take Crestor 5mg  every other day.  Take coenzyme Q10 200mg  daily. You do not need a prescription for this..  Use Xanax 0.25mg  daily as needed for anxiety.  Your physician wants you to follow-up in: 6 months with Dr Shirlee Latch. You will receive a reminder letter in the mail two months in advance. If you don't receive a letter, please call our office to schedule the follow-up appointment.

## 2011-08-29 NOTE — Assessment & Plan Note (Signed)
Very high LDL.  I think it would be reasonable to try a low dose of Crestor, 5 mg qod, to see if we can get LDL down a bit.  She has had myalgias with Lipitor, so I will use a very low dose of Crestor.  I will also have her take coenzyme q10 200 mg daily.

## 2011-08-29 NOTE — Assessment & Plan Note (Signed)
Significant anxiety.  I think this is likely behind her frontal headaches that she has recently developed.  I will given her a low dose of Xanax to be taken as needed (0.25 mg).  If the headaches do not subside, given her age, she probably should get an ESR to rule out giant cell arteritis.

## 2011-09-09 ENCOUNTER — Other Ambulatory Visit: Payer: Self-pay | Admitting: Cardiology

## 2011-09-09 DIAGNOSIS — E7800 Pure hypercholesterolemia, unspecified: Secondary | ICD-10-CM

## 2011-09-14 ENCOUNTER — Ambulatory Visit (INDEPENDENT_AMBULATORY_CARE_PROVIDER_SITE_OTHER)
Admission: RE | Admit: 2011-09-14 | Discharge: 2011-09-14 | Disposition: A | Discharge status: Home / Self Care | Payer: 59 | Source: Ambulatory Visit | Attending: Internal Medicine | Admitting: Internal Medicine

## 2011-09-14 ENCOUNTER — Other Ambulatory Visit: Payer: Self-pay | Admitting: *Deleted

## 2011-09-14 ENCOUNTER — Ambulatory Visit (INDEPENDENT_AMBULATORY_CARE_PROVIDER_SITE_OTHER): Payer: 59 | Admitting: Internal Medicine

## 2011-09-14 ENCOUNTER — Encounter: Payer: Self-pay | Admitting: Internal Medicine

## 2011-09-14 VITALS — BP 162/82 | HR 73 | Temp 98.6°F | Wt 136.0 lb

## 2011-09-14 DIAGNOSIS — R05 Cough: Secondary | ICD-10-CM

## 2011-09-14 DIAGNOSIS — E7800 Pure hypercholesterolemia, unspecified: Secondary | ICD-10-CM

## 2011-09-14 MED ORDER — BUDESONIDE-FORMOTEROL FUMARATE 80-4.5 MCG/ACT IN AERO: 2.0000 | INHALATION_SPRAY | Freq: Two times a day (BID) | RESPIRATORY_TRACT | Status: DC

## 2011-09-14 MED ORDER — ZOLPIDEM TARTRATE 10 MG PO TABS: 10.0000 mg | ORAL_TABLET | Freq: Every evening | ORAL | Status: DC | PRN

## 2011-09-14 MED ORDER — ALPRAZOLAM 0.25 MG PO TABS: ORAL_TABLET | ORAL | Status: DC

## 2011-09-14 NOTE — Progress Notes (Signed)
  Subjective:    Patient ID: Lori [name], female    DOB: 03/16/1925, 77 y.o.   MRN: [id number]  HPI Mrs. Schoon presents with a 3 day h/o chills, cough, SOB and feels weak. She has been around family members who were sick. She is concerned for pneumonia.   Review of Systems     Objective:   Physical Exam        Assessment & Plan:

## 2011-09-14 NOTE — Patient Instructions (Signed)
Respiratory - no evidence of bacterial infection: no fever, no purulent sputum. Lungs sound clear. Chest x-ray is negative for pneumonia or infiltrates of any kind. Plan - supportive care for viral respiratory infection: fluids, vitamine C, robitussin DM 1 tsp every 4-6 hours as needed for cough. Call for fever, increased shortness of breath, purulent sputum.

## 2011-09-15 ENCOUNTER — Ambulatory Visit: Payer: 59 | Admitting: Internal Medicine

## 2011-09-26 ENCOUNTER — Ambulatory Visit: Payer: 59 | Admitting: Internal Medicine

## 2011-09-28 ENCOUNTER — Ambulatory Visit (INDEPENDENT_AMBULATORY_CARE_PROVIDER_SITE_OTHER): Payer: 59 | Admitting: Internal Medicine

## 2011-09-28 ENCOUNTER — Encounter: Payer: Self-pay | Admitting: Internal Medicine

## 2011-09-28 VITALS — BP 132/62 | HR 82 | Temp 97.0°F

## 2011-09-28 DIAGNOSIS — J069 Acute upper respiratory infection, unspecified: Secondary | ICD-10-CM

## 2011-09-28 DIAGNOSIS — J441 Chronic obstructive pulmonary disease with (acute) exacerbation: Secondary | ICD-10-CM

## 2011-09-28 MED ORDER — AZITHROMYCIN 250 MG PO TABS: ORAL_TABLET | ORAL | Status: AC

## 2011-09-28 MED ORDER — HYDROCODONE-HOMATROPINE 5-1.5 MG/5ML PO SYRP: 5.0000 mL | ORAL_SOLUTION | Freq: Three times a day (TID) | ORAL | Status: AC | PRN
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
## 2011-09-28 NOTE — Patient Instructions (Signed)
It was good to see you today. We reviewed the chest X-ray done 09/14/11: no pneumonia or other new problems Zpak antibiotics and Hydromet cough syrup - Your prescription(s) have been submitted to your pharmacy. Please take as directed and contact our office if you believe you are having problem(s) with the medication(s). Alternate between ibuprofen and tylenol for aches, pain and fever symptoms as discussed Keep hydrated, rest and call if symptoms worse or unimproved

## 2011-11-17 ENCOUNTER — Telehealth: Payer: Self-pay | Admitting: Internal Medicine

## 2011-11-17 DIAGNOSIS — I519 Heart disease, unspecified: Secondary | ICD-10-CM

## 2011-11-17 DIAGNOSIS — R42 Dizziness and giddiness: Secondary | ICD-10-CM

## 2011-11-17 DIAGNOSIS — I10 Essential (primary) hypertension: Secondary | ICD-10-CM

## 2011-11-17 DIAGNOSIS — E785 Hyperlipidemia, unspecified: Secondary | ICD-10-CM

## 2011-11-17 DIAGNOSIS — N3000 Acute cystitis without hematuria: Secondary | ICD-10-CM

## 2011-11-17 NOTE — Telephone Encounter (Signed)
In regard to possible UTI - she may come to the lab today for a U/A so that she is not at risk for an acute infection over the next several days. While in lab she can go ahead and get her cholesterol checked. (orders entered)  She can be added on to Monday's schedule for her other problems.

## 2011-11-17 NOTE — Telephone Encounter (Signed)
The patient called wanting to be seen early next week for a number of issues.  She states she thinks she has a UTI, she is complaining of neck pain, shortness of breath, and is hoping to get her cholesterol checked while she is here.  This seems like it will be a longer appointment, so I am hoping to get your advice on where to place her.  She was offered an appointment with a different dr, but has refused and only wants to be seen by you.   Thanks!

## 2011-11-28 ENCOUNTER — Ambulatory Visit (HOSPITAL_COMMUNITY)
Admission: RE | Admit: 2011-11-28 | Discharge: 2011-11-28 | Disposition: A | Discharge status: Home / Self Care | Payer: 59 | Source: Ambulatory Visit | Attending: Internal Medicine | Admitting: Internal Medicine

## 2011-11-28 DIAGNOSIS — Z1231 Encounter for screening mammogram for malignant neoplasm of breast: Secondary | ICD-10-CM | POA: Insufficient documentation

## 2012-01-12 ENCOUNTER — Telehealth: Payer: Self-pay | Admitting: Internal Medicine

## 2012-01-12 NOTE — Telephone Encounter (Signed)
The pt called and is hoping to be worked in for a cpe.  She states she is having neck problems but is also hoping to get her cpe before she leaves for the summer.  Do you want her worked in?   Thanks!

## 2012-01-12 NOTE — Telephone Encounter (Signed)
Tried calling the patient to relay this information and offer her an apt, however, there was no answer and her voice mail does not take messages.

## 2012-01-12 NOTE — Telephone Encounter (Signed)
Monday will work better but if she is leaving town before Monday she can be worked in late today.

## 2012-01-16 ENCOUNTER — Ambulatory Visit (INDEPENDENT_AMBULATORY_CARE_PROVIDER_SITE_OTHER): Payer: 59 | Admitting: Internal Medicine

## 2012-01-16 ENCOUNTER — Other Ambulatory Visit (INDEPENDENT_AMBULATORY_CARE_PROVIDER_SITE_OTHER): Payer: 59

## 2012-01-16 ENCOUNTER — Encounter: Payer: Self-pay | Admitting: Internal Medicine

## 2012-01-16 VITALS — BP 112/70 | HR 84 | Temp 98.6°F | Resp 16 | Wt 138.0 lb

## 2012-01-16 DIAGNOSIS — M542 Cervicalgia: Secondary | ICD-10-CM

## 2012-01-16 DIAGNOSIS — N39498 Other specified urinary incontinence: Secondary | ICD-10-CM

## 2012-01-16 DIAGNOSIS — I519 Heart disease, unspecified: Secondary | ICD-10-CM

## 2012-01-16 DIAGNOSIS — I6529 Occlusion and stenosis of unspecified carotid artery: Secondary | ICD-10-CM

## 2012-01-16 DIAGNOSIS — I10 Essential (primary) hypertension: Secondary | ICD-10-CM

## 2012-01-16 DIAGNOSIS — E785 Hyperlipidemia, unspecified: Secondary | ICD-10-CM

## 2012-01-16 DIAGNOSIS — J449 Chronic obstructive pulmonary disease, unspecified: Secondary | ICD-10-CM

## 2012-01-16 DIAGNOSIS — J4489 Other specified chronic obstructive pulmonary disease: Secondary | ICD-10-CM

## 2012-01-16 LAB — URINALYSIS, ROUTINE W REFLEX MICROSCOPIC
Nitrite: NEGATIVE
Total Protein, Urine: NEGATIVE
pH: 5.5 (ref 5.0–8.0)

## 2012-01-16 LAB — HEPATIC FUNCTION PANEL
ALT: 15 U/L (ref 0–35)
AST: 20 U/L (ref 0–37)
Albumin: 4 g/dL (ref 3.5–5.2)
Total Protein: 6.8 g/dL (ref 6.0–8.3)

## 2012-01-16 LAB — CBC WITH DIFFERENTIAL/PLATELET
Basophils Absolute: 0 10*3/uL (ref 0.0–0.1)
Eosinophils Relative: 1.5 % (ref 0.0–5.0)
HCT: 43.5 % (ref 36.0–46.0)
Lymphocytes Relative: 23.3 % (ref 12.0–46.0)
Monocytes Relative: 5.3 % (ref 3.0–12.0)
Neutrophils Relative %: 69.3 % (ref 43.0–77.0)
Platelets: 243 10*3/uL (ref 150.0–400.0)
WBC: 8.3 10*3/uL (ref 4.5–10.5)

## 2012-01-16 LAB — COMPREHENSIVE METABOLIC PANEL
ALT: 15 U/L (ref 0–35)
AST: 20 U/L (ref 0–37)
CO2: 30 mEq/L (ref 19–32)
GFR: 66.68 mL/min (ref >60.00)
Sodium: 142 mEq/L (ref 135–145)
Total Bilirubin: 0.5 mg/dL (ref 0.3–1.2)
Total Protein: 6.8 g/dL (ref 6.0–8.3)

## 2012-01-16 LAB — LIPID PANEL
Total CHOL/HDL Ratio: 3
Triglycerides: 157 mg/dL — ABNORMAL HIGH (ref 0.0–149.0)
VLDL: 31.4 mg/dL (ref 0.0–40.0)

## 2012-01-17 NOTE — Assessment & Plan Note (Signed)
No bruits on exam. Her neck pain is not c/w carotid artery disease  Plan - follow-up carotid doppler as recommended by cardiology.

## 2012-01-17 NOTE — Assessment & Plan Note (Signed)
BP Readings from Last 3 Encounters:  01/16/12 112/70  09/28/11 132/62  09/14/11 162/82   Good control at this time on present medication.
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
## 2012-01-17 NOTE — Assessment & Plan Note (Signed)
She is stable at this time but does have perceptible increased WOB even at rest.   Plan - continue symbicort

## 2012-01-17 NOTE — Assessment & Plan Note (Signed)
No evidence of decompensation

## 2012-01-17 NOTE — Assessment & Plan Note (Signed)
She has been intolerant of "statins" and is currently on Welchol.  Plan - lipid panel today with recommendations to follow.

## 2012-01-19 ENCOUNTER — Other Ambulatory Visit: Payer: Self-pay | Admitting: Cardiology

## 2012-01-19 ENCOUNTER — Other Ambulatory Visit: Payer: 59

## 2012-01-19 DIAGNOSIS — I6529 Occlusion and stenosis of unspecified carotid artery: Secondary | ICD-10-CM

## 2012-01-20 ENCOUNTER — Encounter: Payer: Self-pay | Admitting: Internal Medicine

## 2012-01-24 ENCOUNTER — Encounter: Payer: Self-pay | Admitting: Internal Medicine

## 2012-01-24 ENCOUNTER — Ambulatory Visit (INDEPENDENT_AMBULATORY_CARE_PROVIDER_SITE_OTHER): Payer: 59 | Admitting: Internal Medicine

## 2012-01-24 VITALS — BP 130/68 | HR 80 | Temp 98.1°F | Resp 16 | Wt 141.0 lb

## 2012-01-24 DIAGNOSIS — J40 Bronchitis, not specified as acute or chronic: Secondary | ICD-10-CM

## 2012-01-24 MED ORDER — DOXYCYCLINE HYCLATE 100 MG PO TABS: 100.0000 mg | ORAL_TABLET | Freq: Two times a day (BID) | ORAL | Status: AC

## 2012-01-24 NOTE — Patient Instructions (Signed)
No evidence of pneumonia. Suspect a bronchitis type process in a setting of emphysema and h/o bronchiectasis.  Plan doxycycloine 100 mg twice a day for 10 days.   Robitussin DM 1 tsp every 4 hours  The red cough syrup only if the cough is keeping you up  Call if not improved in 48 hrs  Cholesterol - please restart the Welchol.

## 2012-01-25 NOTE — Progress Notes (Signed)
  Subjective:    Patient ID: Lori [name], female    DOB: 03/16/1925, 78 y.o.   MRN: [id number]  HPI Lori [name] presents acutely for cough, fatigue and mild increase in SOB. She has been ill since last Friday. Something going around got her. She has been very fatigued and has spent a lot of time sleeping. She has not had a productive cough. No documented fever. She does not report any wheezing. She has had loose stools but she reports this has now cleared.  PMH, FamHx and SocHx reviewed for any changes and relevance.    Review of Systems System review is negative for any constitutional, cardiac, pulmonary, GI or neuro symptoms or complaints other than as described in the HPI.     Objective:   Physical Exam Filed Vitals:   01/24/12 1610  BP: 130/68  Pulse: 80  Temp: 98.1 F (36.7 C)  Resp: 16   Gen'l- well groomed woman in no acute distress HEENT- normal Cor - RRR Pulm - no increased WOB, normal lung sounds w/o rales or wheezing Neuro - A&O x 3, normal       Assessment & Plan:  Bronchitis - patient with emphysema and h/o bronchiectasis now with mild bronchitis.  Plan - doxycycline 100 mg bid x 10  Supportive care: robitussin DM, prom/cod for sleep if needed.

## 2012-01-26 ENCOUNTER — Ambulatory Visit (INDEPENDENT_AMBULATORY_CARE_PROVIDER_SITE_OTHER)
Admission: RE | Admit: 2012-01-26 | Discharge: 2012-01-26 | Disposition: A | Discharge status: Home / Self Care | Payer: Medicare Other | Source: Ambulatory Visit | Attending: Internal Medicine | Admitting: Internal Medicine

## 2012-01-26 ENCOUNTER — Encounter (INDEPENDENT_AMBULATORY_CARE_PROVIDER_SITE_OTHER): Payer: Medicare Other

## 2012-01-26 DIAGNOSIS — I6529 Occlusion and stenosis of unspecified carotid artery: Secondary | ICD-10-CM

## 2012-01-26 DIAGNOSIS — M542 Cervicalgia: Secondary | ICD-10-CM

## 2012-01-26 MED ORDER — IOHEXOL 300 MG/ML  SOLN
75.0000 mL | Freq: Once | INTRAMUSCULAR | Status: AC | PRN
  Administered 2012-01-26: 75 mL via INTRAVENOUS

## 2012-01-31 ENCOUNTER — Telehealth: Payer: Self-pay | Admitting: *Deleted

## 2012-01-31 NOTE — Telephone Encounter (Signed)
Message copied by Elnora Morrison on Tue Jan 31, 2012 10:21 AM ------      Message from: Illene Regulus E      Created: Mon Jan 30, 2012  5:25 AM       Please call patient - CT reveals chronic sinusitis but is otherwise normal: no mass, enlarged glands or lymph nodes. A good news report

## 2012-01-31 NOTE — Telephone Encounter (Signed)
Left message on vm of report. Marland Kitchen

## 2012-02-02 ENCOUNTER — Encounter: Payer: Self-pay | Admitting: Internal Medicine

## 2012-02-03 ENCOUNTER — Telehealth: Payer: Self-pay | Admitting: *Deleted

## 2012-02-03 NOTE — Telephone Encounter (Signed)
Spoke with patient husband Lexicographer. States patient is sleeping and to give message to him. Carotid  Study is negative for blockages and to follow up with the study in one year.

## 2012-02-03 NOTE — Telephone Encounter (Signed)
Spoke with patients husband Lexicographer. Results of carotid study given to Mr. Mavis. States she is sleeping and will give this message to her.

## 2012-02-03 NOTE — Telephone Encounter (Signed)
Message copied by Elnora Morrison on Fri Feb 03, 2012  2:37 PM ------      Message from: Illene Regulus E      Created: Thu Feb 02, 2012  4:00 PM       Please call patient - carotid doppler study reveals no significant blockages. Follow-up study in 1 year recommended.

## 2012-03-20 ENCOUNTER — Encounter: Payer: Self-pay | Admitting: Cardiology

## 2012-03-20 ENCOUNTER — Telehealth: Payer: Self-pay | Admitting: Cardiology

## 2012-03-20 NOTE — Telephone Encounter (Signed)
New Problem:    I called the patient in an attempt to schedule an appointment with Dr. Shirlee Latch, and after the phone stopped ringing was met with a message that the voicemail box was not set up for that number.  I sent a letter.

## 2012-03-30 ENCOUNTER — Other Ambulatory Visit: Payer: Self-pay | Admitting: Internal Medicine

## 2012-04-03 ENCOUNTER — Ambulatory Visit (INDEPENDENT_AMBULATORY_CARE_PROVIDER_SITE_OTHER): Payer: Medicare Other | Admitting: Internal Medicine

## 2012-04-03 ENCOUNTER — Encounter: Payer: Self-pay | Admitting: Internal Medicine

## 2012-04-03 ENCOUNTER — Telehealth: Payer: Self-pay | Admitting: Internal Medicine

## 2012-04-03 ENCOUNTER — Other Ambulatory Visit: Payer: Self-pay | Admitting: *Deleted

## 2012-04-03 VITALS — BP 140/82 | HR 74 | Temp 98.7°F | Resp 16 | Wt 136.0 lb

## 2012-04-03 DIAGNOSIS — R197 Diarrhea, unspecified: Secondary | ICD-10-CM

## 2012-04-03 DIAGNOSIS — I10 Essential (primary) hypertension: Secondary | ICD-10-CM

## 2012-04-03 DIAGNOSIS — I519 Heart disease, unspecified: Secondary | ICD-10-CM

## 2012-04-03 MED ORDER — DICYCLOMINE HCL 10 MG PO CAPS: ORAL_CAPSULE | ORAL | Status: DC

## 2012-04-03 NOTE — Telephone Encounter (Signed)
LMOMTCB x 1 for pt at 2nd number he gave as contact. Was unable to leave a msg at the home number because there is no voicemail

## 2012-04-03 NOTE — Telephone Encounter (Addendum)
When questioned what type of symptoms pt had, Lori [name] said that she has COPD.  That they are long time patients of [facility] & are in need of an appt.  Pt's husband stated that he & his wife both need appts w/ [facility].  There is a message for Lori [name] as well.  Lori [name]

## 2012-04-03 NOTE — Telephone Encounter (Signed)
Error

## 2012-04-04 ENCOUNTER — Inpatient Hospital Stay (HOSPITAL_COMMUNITY): Payer: Medicare Other

## 2012-04-04 ENCOUNTER — Encounter (HOSPITAL_COMMUNITY): Payer: Self-pay | Admitting: Urology

## 2012-04-04 ENCOUNTER — Inpatient Hospital Stay (HOSPITAL_COMMUNITY)
Admission: AD | Admit: 2012-04-04 | Discharge: 2012-04-06 | DRG: 379 | Disposition: A | Discharge status: Home / Self Care | Payer: Medicare Other | Source: Ambulatory Visit | Attending: Internal Medicine | Admitting: Internal Medicine

## 2012-04-04 DIAGNOSIS — K921 Melena: Principal | ICD-10-CM | POA: Diagnosis present

## 2012-04-04 DIAGNOSIS — R197 Diarrhea, unspecified: Secondary | ICD-10-CM | POA: Diagnosis present

## 2012-04-04 DIAGNOSIS — E7800 Pure hypercholesterolemia, unspecified: Secondary | ICD-10-CM

## 2012-04-04 DIAGNOSIS — J449 Chronic obstructive pulmonary disease, unspecified: Secondary | ICD-10-CM

## 2012-04-04 DIAGNOSIS — K648 Other hemorrhoids: Secondary | ICD-10-CM | POA: Diagnosis present

## 2012-04-04 DIAGNOSIS — I10 Essential (primary) hypertension: Secondary | ICD-10-CM | POA: Diagnosis present

## 2012-04-04 DIAGNOSIS — D126 Benign neoplasm of colon, unspecified: Secondary | ICD-10-CM | POA: Diagnosis present

## 2012-04-04 DIAGNOSIS — R194 Change in bowel habit: Secondary | ICD-10-CM

## 2012-04-04 DIAGNOSIS — I519 Heart disease, unspecified: Secondary | ICD-10-CM | POA: Diagnosis present

## 2012-04-04 DIAGNOSIS — R079 Chest pain, unspecified: Secondary | ICD-10-CM | POA: Diagnosis present

## 2012-04-04 DIAGNOSIS — R0602 Shortness of breath: Secondary | ICD-10-CM | POA: Diagnosis present

## 2012-04-04 DIAGNOSIS — Z86010 Personal history of colonic polyps: Secondary | ICD-10-CM

## 2012-04-04 DIAGNOSIS — K589 Irritable bowel syndrome without diarrhea: Secondary | ICD-10-CM | POA: Diagnosis present

## 2012-04-04 DIAGNOSIS — R109 Unspecified abdominal pain: Secondary | ICD-10-CM

## 2012-04-04 DIAGNOSIS — J4489 Other specified chronic obstructive pulmonary disease: Secondary | ICD-10-CM | POA: Diagnosis present

## 2012-04-04 LAB — RETICULOCYTES
RBC.: 5.01 MIL/uL (ref 3.87–5.11)
Retic Ct Pct: 0.8 % (ref 0.4–3.1)

## 2012-04-04 LAB — CBC
HCT: 43 % (ref 36.0–46.0)
MCHC: 34 g/dL (ref 30.0–36.0)
MCV: 85.8 fL (ref 78.0–100.0)
RDW: 13.8 % (ref 11.5–15.5)
WBC: 6.4 10*3/uL (ref 4.0–10.5)

## 2012-04-04 LAB — COMPREHENSIVE METABOLIC PANEL
ALT: 22 U/L (ref 0–35)
AST: 18 U/L (ref 0–37)
Albumin: 3.8 g/dL (ref 3.5–5.2)
Alkaline Phosphatase: 89 U/L (ref 39–117)
Potassium: 4.1 mEq/L (ref 3.5–5.1)
Sodium: 135 mEq/L (ref 135–145)
Total Protein: 6.7 g/dL (ref 6.0–8.3)

## 2012-04-04 LAB — CARDIAC PANEL(CRET KIN+CKTOT+MB+TROPI)
CK, MB: 3.1 ng/mL (ref 0.3–4.0)
Troponin I: <0.3 ng/mL (ref <0.30)
Troponin I: <0.3 ng/mL (ref <0.30)

## 2012-04-04 LAB — IRON AND TIBC
Saturation Ratios: 42 % (ref 20–55)
UIBC: 163 ug/dL (ref 125–400)

## 2012-04-04 LAB — DIFFERENTIAL
Basophils Absolute: 0 10*3/uL (ref 0.0–0.1)
Eosinophils Relative: 1 % (ref 0–5)
Lymphocytes Relative: 28 % (ref 12–46)
Monocytes Absolute: 0.3 10*3/uL (ref 0.1–1.0)

## 2012-04-04 LAB — FERRITIN: Ferritin: 86 ng/mL (ref 10–291)

## 2012-04-04 LAB — VITAMIN B12: Vitamin B-12: 1028 pg/mL — ABNORMAL HIGH (ref 211–911)

## 2012-04-04 MED ORDER — NITROGLYCERIN 0.4 MG SL SUBL: 0.4000 mg | SUBLINGUAL_TABLET | SUBLINGUAL | Status: DC | PRN

## 2012-04-04 MED ORDER — BUDESONIDE-FORMOTEROL FUMARATE 80-4.5 MCG/ACT IN AERO
2.0000 | INHALATION_SPRAY | Freq: Two times a day (BID) | RESPIRATORY_TRACT | Status: DC
  Administered 2012-04-04 – 2012-04-06 (×4): 2 via RESPIRATORY_TRACT
  Filled 2012-04-04: qty 6.9

## 2012-04-04 MED ORDER — ATORVASTATIN CALCIUM 10 MG PO TABS
10.0000 mg | ORAL_TABLET | Freq: Every day | ORAL | Status: DC
  Administered 2012-04-05: 10 mg via ORAL
  Filled 2012-04-04 (×3): qty 1

## 2012-04-04 MED ORDER — PANTOPRAZOLE SODIUM 40 MG PO TBEC
40.0000 mg | DELAYED_RELEASE_TABLET | Freq: Every day | ORAL | Status: DC
  Administered 2012-04-04 – 2012-04-06 (×3): 40 mg via ORAL
  Filled 2012-04-04 (×3): qty 1

## 2012-04-04 MED ORDER — ALPRAZOLAM 0.25 MG PO TABS: 0.2500 mg | ORAL_TABLET | Freq: Three times a day (TID) | ORAL | Status: DC | PRN

## 2012-04-04 MED ORDER — SODIUM CHLORIDE 0.9 % IJ SOLN
3.0000 mL | Freq: Two times a day (BID) | INTRAMUSCULAR | Status: DC
  Administered 2012-04-05 – 2012-04-06 (×3): 3 mL via INTRAVENOUS

## 2012-04-04 MED ORDER — VALSARTAN-HYDROCHLOROTHIAZIDE 160-12.5 MG PO TABS: 1.0000 | ORAL_TABLET | Freq: Every day | ORAL | Status: DC

## 2012-04-04 MED ORDER — IOHEXOL 300 MG/ML  SOLN
100.0000 mL | Freq: Once | INTRAMUSCULAR | Status: AC | PRN
  Administered 2012-04-04: 100 mL via INTRAVENOUS

## 2012-04-04 MED ORDER — CLONIDINE HCL 0.1 MG PO TABS
0.1000 mg | ORAL_TABLET | ORAL | Status: DC | PRN
  Administered 2012-04-04: 0.1 mg via ORAL
  Filled 2012-04-04: qty 1

## 2012-04-04 MED ORDER — LOPERAMIDE HCL 1 MG/5ML PO LIQD
1.0000 mg | Freq: Three times a day (TID) | ORAL | Status: DC | PRN
  Filled 2012-04-04: qty 5

## 2012-04-04 MED ORDER — DEXTROSE-NACL 5-0.45 % IV SOLN
INTRAVENOUS | Status: DC
  Administered 2012-04-04 – 2012-04-05 (×2): 75 mL/h via INTRAVENOUS
  Administered 2012-04-05: 01:00:00 via INTRAVENOUS

## 2012-04-04 MED ORDER — IRBESARTAN 150 MG PO TABS
150.0000 mg | ORAL_TABLET | Freq: Every day | ORAL | Status: DC
  Administered 2012-04-04 – 2012-04-06 (×3): 150 mg via ORAL
  Filled 2012-04-04 (×3): qty 1

## 2012-04-04 MED ORDER — ACETAMINOPHEN 500 MG PO TABS: 500.0000 mg | ORAL_TABLET | Freq: Two times a day (BID) | ORAL | Status: DC | PRN

## 2012-04-04 MED ORDER — ENOXAPARIN SODIUM 30 MG/0.3ML ~~LOC~~ SOLN
30.0000 mg | SUBCUTANEOUS | Status: DC
  Administered 2012-04-04 – 2012-04-05 (×2): 30 mg via SUBCUTANEOUS
  Filled 2012-04-04 (×2): qty 0.3

## 2012-04-04 MED ORDER — DICYCLOMINE HCL 10 MG PO CAPS
10.0000 mg | ORAL_CAPSULE | Freq: Three times a day (TID) | ORAL | Status: DC | PRN
  Filled 2012-04-04: qty 1

## 2012-04-04 MED ORDER — LOPERAMIDE HCL 1 MG/7.5ML PO LIQD: 1.0000 mg | Freq: Three times a day (TID) | ORAL | Status: DC | PRN

## 2012-04-04 MED ORDER — HYDROCORTISONE ACE-PRAMOXINE 2.5-1 % RE CREA
TOPICAL_CREAM | Freq: Three times a day (TID) | RECTAL | Status: DC | PRN
  Filled 2012-04-04: qty 30

## 2012-04-04 MED ORDER — ZOLPIDEM TARTRATE 5 MG PO TABS
5.0000 mg | ORAL_TABLET | Freq: Every day | ORAL | Status: DC
  Administered 2012-04-04 – 2012-04-05 (×2): 5 mg via ORAL
  Filled 2012-04-04 (×2): qty 1

## 2012-04-04 MED ORDER — BIMATOPROST 0.01 % OP SOLN
1.0000 [drp] | Freq: Every day | OPHTHALMIC | Status: DC
  Administered 2012-04-04 – 2012-04-05 (×2): 1 [drp] via OPHTHALMIC
  Filled 2012-04-04 (×2): qty 2.5

## 2012-04-04 MED ORDER — HYDROCHLOROTHIAZIDE 12.5 MG PO CAPS
12.5000 mg | ORAL_CAPSULE | Freq: Every day | ORAL | Status: DC
  Administered 2012-04-04 – 2012-04-06 (×3): 12.5 mg via ORAL
  Filled 2012-04-04 (×3): qty 1

## 2012-04-04 NOTE — Progress Notes (Signed)
BP 191/81 complaints of mild headache, Dr Debby Bud updated, orders received, will continue to monitor.

## 2012-04-04 NOTE — Progress Notes (Signed)
Subjective:    Patient ID: Lori [name], female    DOB: 03/16/1925, 78 y.o.   MRN: [id number]  HPI Lori [name] presents with 2 week h/o abdominal pain, diarrhea with dark/black stools, chest pain, increased SOB, weakness and fatigue. She has had not energy, has felt cognitively impaired and not herself. She was seen by her local physician in [facility] and was started on Cipro and Flagyl for possible diverticulitis but has seen no improvement in her symptoms. Her diarrhea, dark stools and upper abdominal pain continue. She does report several episodes of frank hematochezia which she had attributed to hemorrhoids. With these symptoms, suggestive of GI bleed with anemia, she is offered in-patient admission for evaluation and treatment. At her request admission is delayed until July 17th AM.   Past Medical History  Diagnosis Date  . COPD (chronic obstructive pulmonary disease)   . Asthma   . ALLERGIC RHINITIS   . Hypertension   . Pleural effusion, left   . Colles' fracture of left radius   . Anxiety   . Back pain of thoracolumbar region     right  . Pruritus   . Diastolic dysfunction   . Pulmonary nodule   . IBS (irritable bowel syndrome)   . Bronchiectasis   . Insomnia   . History of pneumonia   . Diverticulosis   . Hx of colonic polyp   . Multiple rib fractures 10/2008    bilateral   Past Surgical History  Procedure Date  . Tonsillectomy   . Appendectomy   . Cholecystectomy   . Polypectomy   . Bladder suspension 2005    A-P  . Rotator cuff repair 2006    right  . Orif wrist fracture 2010    left wrist.  Dr. Mina Marble.  . Total knee arthroplasty 08/2010    right   Family History  Problem Relation Age of Onset  . Heart disease Mother   . Colon cancer Neg Hx   . Bipolar disorder Daughter   . Rheumatic fever Mother   . Pneumonia Father    History   Social History  . Marital Status: Married    Spouse Name: N/A    Number of Children: 3  . Years of Education: N/A    Occupational History  . retired    Social History Main Topics  . Smoking status: Former Smoker -- 0.3 packs/day for 10 years    Types: Cigarettes    Quit date: 09/19/1968  . Smokeless tobacco: Never Used  . Alcohol Use: Yes     rarely  . Drug Use: No  . Sexually Active: No   Other Topics Concern  . Not on file   Social History Narrative   College - [employer]; [place] - [place] [employer]. Married 1959 - spouse s/p valve surgery with resulting paralysis hemidiaphragm ('10). 3 daughter, 5 grandsons, 4 granddaughters.  1 daughter bipolar - social issues. She remains very active, but can't play tennis. Pt was apprently ex-smoker, but husband does not recollect pt ever smoking. Stressful move to smaller quarters (fall '09) and now moving to [place] (fall '12).       Review of Systems System review is negative for any constitutional, cardiac, pulmonary, GI or neuro symptoms or complaints other than as described in the HPI.     Objective:   Physical Exam Filed Vitals:   04/03/12 1547  BP: 140/82  Pulse: 74  Temp: 98.7 F (37.1 C)  Resp: 16  Gen'l- ill appearing older woman who is not in acute distress but appears pale HEENT-[place]/AT, EACs/TMs normal, oropharynx normal, C&S clear Neck- supple, no thyromegaly Nodes - negative in the cervical, supraclavicular region Cor- 2+ radial pulse, RRR, no JVD, no carotid bruits Pulm - normal respiratory rate, breath sounds w/o wheezing, no frank rales, mild rhonchi at bases Abd- BS+ x 4, no guarding or rebound, tender to palpation at the epigastrium, mildy tender at LLQ, no HSM Rectal - deferred (will check after admission) Neuro - A&O x 3, speech is clear, CN-normal facial symmetry, PERRLA, EOMI. Cerebellar function normal w/o tremor, w/ normal gait        Assessment & Plan:  1. Acute abdominal pain with diarrhea/dark stools - patient has failed outpatient treatment for possible diverticulitis with Cipro/Flagyl. Her history, symptoms  and exam are worrisome for GI bleed with symptomatic anemia. She is offered direct admission but requests delay overnight with AM admission. Bed request is placed.   Plan Tele admit  Lab - CBC, CMet, cardiac panel  Imaging - CT abdomen/pelvis w/ contrast, CXR  12 lead EKG at admission  IV fluids at gentle rate

## 2012-04-04 NOTE — Telephone Encounter (Signed)
Spoke with pt's husband-states Dr Arthur Holms put pt in hospital and family will call for appt or HFU once patient is out of hospital.

## 2012-04-04 NOTE — Assessment & Plan Note (Signed)
BP Readings from Last 3 Encounters:  04/03/12 140/82  01/24/12 130/68  01/16/12 112/70   Will continue home medications.

## 2012-04-04 NOTE — H&P (Signed)
Patient ID: Lori [name], female DOB: 03/16/1925, 78 y.o. MRN: [id number]  HPI  Lori [name] presents with 2 week h/o abdominal pain, diarrhea with dark/black stools, chest pain, increased SOB, weakness and fatigue. She has had not energy, has felt cognitively impaired and not herself. She was seen by her local physician in [facility] and was started on Cipro and Flagyl for possible diverticulitis but has seen no improvement in her symptoms. Her diarrhea, dark stools and upper abdominal pain continue. She does report several episodes of frank hematochezia which she had attributed to hemorrhoids. With these symptoms, suggestive of GI bleed with anemia, she is offered in-patient admission for evaluation and treatment. At her request admission is delayed until July 17th AM.  Past Medical History   Diagnosis  Date   .  COPD (chronic obstructive pulmonary disease)    .  Asthma    .  ALLERGIC RHINITIS    .  Hypertension    .  Pleural effusion, left    .  Colles' fracture of left radius    .  Anxiety    .  Back pain of thoracolumbar region      right   .  Pruritus    .  Diastolic dysfunction    .  Pulmonary nodule    .  IBS (irritable bowel syndrome)    .  Bronchiectasis    .  Insomnia    .  History of pneumonia    .  Diverticulosis    .  Hx of colonic polyp    .  Multiple rib fractures  10/2008     bilateral    Past Surgical History   Procedure  Date   .  Tonsillectomy    .  Appendectomy    .  Cholecystectomy    .  Polypectomy    .  Bladder suspension  2005     A-P   .  Rotator cuff repair  2006     right   .  Orif wrist fracture  2010     left wrist. Dr. Mina Marble.   .  Total knee arthroplasty  08/2010     right    Family History   Problem  Relation  Age of Onset   .  Heart disease  Mother    .  Colon cancer  Neg Hx    .  Bipolar disorder  Daughter    .  Rheumatic fever  Mother    .  Pneumonia  Father     History    Social History   .  Marital Status:  Married     Spouse  Name:  N/A     Number of Children:  3   .  Years of Education:  N/A    Occupational History   .  retired     Social History Main Topics   .  Smoking status:  Former Smoker -- 0.3 packs/day for 10 years     Types:  Cigarettes     Quit date:  09/19/1968   .  Smokeless tobacco:  Never Used   .  Alcohol Use:  Yes      rarely   .  Drug Use:  No   .  Sexually Active:  No    Other Topics  Concern   .  Not on file    Social History Narrative    College - [employer]; [place] - [place] [employer]. Married 1959 - spouse  s/p valve surgery with resulting paralysis hemidiaphragm ('10). 3 daughter, 5 grandsons, 4 granddaughters. 1 daughter bipolar - social issues. She remains very active, but can't play tennis. Pt was apprently ex-smoker, but husband does not recollect pt ever smoking. Stressful move to smaller quarters (fall '09) and now moving to [place] (fall '12).    Review of Systems  System review is negative for any constitutional, cardiac, pulmonary, GI or neuro symptoms or complaints other than as described in the HPI.   Objective:   Physical Exam  Filed Vitals:    04/03/12 1547   BP:  140/82   Pulse:  74   Temp:  98.7 F (37.1 C)   Resp:  16    Gen'l- ill appearing older woman who is not in acute distress but appears pale  HEENT-[place]/AT, EACs/TMs normal, oropharynx normal, C&S clear  Neck- supple, no thyromegaly  Nodes - negative in the cervical, supraclavicular region  Cor- 2+ radial pulse, RRR, no JVD, no carotid bruits  Pulm - normal respiratory rate, breath sounds w/o wheezing, no frank rales, mild rhonchi at bases  Abd- BS+ x 4, no guarding or rebound, tender to palpation at the epigastrium, mildy tender at LLQ, no HSM  Rectal - deferred (will check after admission)  Neuro - A&O x 3, speech is clear, CN-normal facial symmetry, PERRLA, EOMI. Cerebellar function normal w/o tremor, w/ normal gait   Assessment & Plan:   1. Acute abdominal pain with diarrhea/dark stools - patient  has failed outpatient treatment for possible diverticulitis with Cipro/Flagyl. Her history, symptoms and exam are worrisome for GI bleed with symptomatic anemia. She is offered direct admission but requests delay overnight with AM admission. Bed request is placed.  Plan Tele admit  Lab - CBC, CMet, cardiac panel  Imaging - CT abdomen/pelvis w/ contrast, CXR  12 lead EKG at admission  IV fluids at gentle rate   HYPERTENSION - Illene Regulus, MD 04/04/2012 3:59 AM Signed  BP Readings from Last 3 Encounters:   04/03/12  140/82   01/24/12  130/68   01/16/12  112/70    Will continue home medications. DIASTOLIC DYSFUNCTION - Illene Regulus, MD 04/04/2012 4:01 AM Signed  H/o diastolic dysfunction - no evidence on exam of decompensation.  Last 2 D echo Oct '12  Study Conclusions  Left ventricle: The cavity size was normal. Systolic function was normal. The estimated ejection fraction was in the range of 55% to 60%. Wall motion was normal; there were no regional wall motion abnormalities.  Plan - continue home regimen  Care with hydration: daily weights ordered.

## 2012-04-04 NOTE — Assessment & Plan Note (Signed)
H/o diastolic dysfunction - no evidence on exam of decompensation. Last 2 D echo Oct '12 Study Conclusions  Left ventricle: The cavity size was normal. Systolic function was normal. The estimated ejection fraction was in the range of 55% to 60%. Wall motion was normal; there were no regional wall motion abnormalities.  Plan - continue home regimen  Care with hydration: daily weights ordered.

## 2012-04-04 NOTE — Progress Notes (Signed)
Subjective: Admitted for abdominal pain, dark stools and blood in stools along with chest pain and SOB'. She still has abdominal pain, SOB and reports two loose stools today. Objective: Lab: Lab Results  Component Value Date   WBC 6.4 04/04/2012   HGB 14.6 04/04/2012   HCT 43.0 04/04/2012   MCV 85.8 04/04/2012   PLT 225 04/04/2012   BMET    Component Value Date/Time   NA 135 04/04/2012 1105   K 4.1 04/04/2012 1105   CL 102 04/04/2012 1105   CO2 24 04/04/2012 1105   GLUCOSE 80 04/04/2012 1105   BUN 16 04/04/2012 1105   CREATININE 0.67 04/04/2012 1105   CALCIUM 9.2 04/04/2012 1105   GFRNONAA 78* 04/04/2012 1105   GFRAA >90 04/04/2012 1105     Imaging:  Scheduled Meds:   . atorvastatin  10 mg Oral q1800  . bimatoprost  1 drop Both Eyes QHS  . budesonide-formoterol  2 puff Inhalation BID  . enoxaparin (LOVENOX) injection  30 mg Subcutaneous Q24H  . hydrochlorothiazide  12.5 mg Oral Daily   And  . irbesartan  150 mg Oral Daily  . pantoprazole  40 mg Oral Daily  . sodium chloride  3 mL Intravenous Q12H  . zolpidem  5 mg Oral QHS  . DISCONTD: valsartan-hydrochlorothiazide  1 tablet Oral Daily   Continuous Infusions:   . dextrose 5 % and 0.45% NaCl 75 mL/hr (04/04/12 1213)   PRN Meds:.acetaminophen, ALPRAZolam, dicyclomine, hydrocortisone-pramoxine, loperamide, nitroGLYCERIN, DISCONTD: Loperamide HCl   Physical Exam: Filed Vitals:   04/04/12 1020  BP: 145/75  Pulse: 64  Temp:   Resp:    WNWD white woman in no distress -wearing her pearls Chest - no increased work of breathing, no wheezes Cor - RRR Abd- BS+, soft     Assessment/Plan: 1. GI - no sign of bleeding, Hgb is normal. Stool for blood pending. CT abdomen pelvis pending  Plan -  If CT is normal will need GI consult  - consider colonoscopy although last study was 2008  2. HTN - stable  3. Cardiac - BNP is normal, 12 lead EKG normal, 1 st set cardiac enzymes normal  4. Pulm - she reports continued increase in  SOB. CXR pending.  Plan - may need pulmonary function study while in hospital.   Illene Regulus 04/04/2012, 12:57 PM

## 2012-04-04 NOTE — Care Management Note (Signed)
    Page 1 of 1   04/09/2012     1:42:38 PM   CARE MANAGEMENT NOTE 04/04/2012  Patient:  Lori [name], Lori [name]   Account Number:  [id number]  Date Initiated:  04/04/2012  Documentation initiated by:  Lanier Clam  Subjective/Objective Assessment:   ADMITTED W/ABD PAIN,DIARRHEA,BLACK STOOLS.     Action/Plan:   FROM HOME W/SPOUSE   Anticipated DC Date:  04/09/2012   Anticipated DC Plan:  HOME W HOME HEALTH SERVICES      DC Planning Services  CM consult      Choice offered to / List presented to:  C-1 Patient           Status of service:  In process, will continue to follow Medicare Important Message given?   (If response is "NO", the following Medicare IM given date fields will be blank) Date Medicare IM given:   Date Additional Medicare IM given:    Discharge Disposition:    Per UR Regulation:  Reviewed for med. necessity/level of care/duration of stay  If discussed at Long Length of Stay Meetings, dates discussed:    Comments:  04/04/12 [facility] RN,BSN NCM [phone]

## 2012-04-05 ENCOUNTER — Inpatient Hospital Stay (HOSPITAL_COMMUNITY): Payer: Medicare Other

## 2012-04-05 ENCOUNTER — Telehealth: Payer: Self-pay

## 2012-04-05 DIAGNOSIS — R197 Diarrhea, unspecified: Secondary | ICD-10-CM

## 2012-04-05 LAB — PULMONARY FUNCTION TEST

## 2012-04-05 LAB — CARDIAC PANEL(CRET KIN+CKTOT+MB+TROPI): CK, MB: 2.7 ng/mL (ref 0.3–4.0)

## 2012-04-05 MED ORDER — ALBUTEROL SULFATE (5 MG/ML) 0.5% IN NEBU
2.5000 mg | INHALATION_SOLUTION | Freq: Once | RESPIRATORY_TRACT | Status: AC
  Administered 2012-04-05: 2.5 mg via RESPIRATORY_TRACT

## 2012-04-05 MED ORDER — PEG-KCL-NACL-NASULF-NA ASC-C 100 G PO SOLR
1.0000 | Freq: Once | ORAL | Status: AC
  Administered 2012-04-05: 100 g via ORAL
  Filled 2012-04-05: qty 1

## 2012-04-05 NOTE — Telephone Encounter (Signed)
I bow to the wisdom of the medicare overseers who are omniscient and able to provide medical judgement from afar: by their decree let the patient be declared an "observation" patient.  We beg their forbearance as we complete her evaluation of severe pulmonary disease and let her complete her colonoscopy, scheduled for AM, her discharge to follow with all alacrity and grace.

## 2012-04-05 NOTE — Progress Notes (Signed)
Subjective: Lori [name] reports that she continues to have fecal leakage and a change in stool. She has not had any bleeding. Her SOB is better  Objective: Lab: Lab Results  Component Value Date   WBC 6.4 04/04/2012   HGB 14.6 04/04/2012   HCT 43.0 04/04/2012   MCV 85.8 04/04/2012   PLT 225 04/04/2012   BMET    Component Value Date/Time   NA 135 04/04/2012 1105   K 4.1 04/04/2012 1105   CL 102 04/04/2012 1105   CO2 24 04/04/2012 1105   GLUCOSE 80 04/04/2012 1105   BUN 16 04/04/2012 1105   CREATININE 0.67 04/04/2012 1105   CALCIUM 9.2 04/04/2012 1105   GFRNONAA 78* 04/04/2012 1105   GFRAA >90 04/04/2012 1105     Imaging: CT ABd/pelvis 7/17: IMPRESSION:  Benign-appearing abdomen and pelvis.   Scheduled Meds:   . atorvastatin  10 mg Oral q1800  . bimatoprost  1 drop Both Eyes QHS  . budesonide-formoterol  2 puff Inhalation BID  . enoxaparin (LOVENOX) injection  30 mg Subcutaneous Q24H  . hydrochlorothiazide  12.5 mg Oral Daily   And  . irbesartan  150 mg Oral Daily  . pantoprazole  40 mg Oral Daily  . sodium chloride  3 mL Intravenous Q12H  . zolpidem  5 mg Oral QHS  . DISCONTD: valsartan-hydrochlorothiazide  1 tablet Oral Daily   Continuous Infusions:   . dextrose 5 % and 0.45% NaCl 75 mL/hr at 04/05/12 0106   PRN Meds:.acetaminophen, ALPRAZolam, cloNIDine, dicyclomine, hydrocortisone-pramoxine, iohexol, loperamide, nitroGLYCERIN, DISCONTD: Loperamide HCl   Physical Exam: Filed Vitals:   04/05/12 0508  BP: 127/76  Pulse: 61  Temp: 98 F (36.7 C)  Resp: 16   Easily awaken well nourished woman in no distress HEENT- C&S clear Cor- 2+ radial and RRR Pulm - no increased WOB, no wheezes Abd- BS+, soft, no guarding Neuor - A&O x 3, HOH.      Assessment/Plan: 1. GI - normal CT. Stool heme negative. Plan GI consult re: change in bowel habit. Last Colonoscopy March '08 - polypectomy with perforation, diverticulosis. Path = adenomatous polyp  2. Pulmonary - appears  stable at rest. Known COPD.  Plan  PFT's ordered and pending  Dispo - [facility] overseers informed me that she doesn't meet in-patient criteria. Plan is to complete eval within 48 hours as possible.  Casimiro Needle Shamone Winzer 04/05/2012, 7:00 AM

## 2012-04-05 NOTE — Progress Notes (Signed)
Patient refuses second half of MoviPrep at this time. States "I just can't do any more right now. I'm exhausted". Patient requests to sleep for several hours then be awakened at approximately 0100 to take second half of prep. Patient informed of potential that prep won't be adequate by the time of colonoscopy and she verbalizes understanding of this risk. Stools are currently loose but remain brown in color. Lori [name]

## 2012-04-05 NOTE — Telephone Encounter (Signed)
RN called stating that per recommendations of Dr Kellie Shropshire at [facility], pt did not meet inpatient criteria, only an observation status.  RN is requesting MEN advise if he is in agreement and pt's status will be updated. Please advise.

## 2012-04-05 NOTE — Telephone Encounter (Signed)
Lori [name] made aware of MD's response via EPIC.

## 2012-04-05 NOTE — Consult Note (Addendum)
Referring Provider: Dr. Debby Bud Primary Care Physician:  Illene Regulus, MD Primary Gastroenterologist:  Dr.Patterson-previous  Reason for Consultation:  Diarrhea, abdominal pain, and fecal soilage  HPI: Lori [name] is a 78 y.o. female , generally in good health and remains very active. She does have history of COPD, hypertension, diverticulosis, colon polyps and IBS. Patient was admitted on 04/03/2012 with complaints of 2 week history of diarrhea dark stools left-sided abdominal pain and weakness. She had [place] in [place] at onset of her symptoms on 03/20/2012. She says she was awakened with intense left-sided abdominal pain in the middle of the night which was associated with diaphoresis and then diarrhea. She says she had seen some dark red blood mixed weight in with her stool as well. She had seen a physician up there and was given a course of Cipro and Flagyl for potential diverticulitis. She says she took the antibiotics for about 8 days but that did not seem to make much difference. Since that time she has had persistent problems with loose to watery stools which she says are occurring about 8-10 times per day. Her left-sided abdominal pain has gradually improved. She's had no ongoing fever or chills no nausea or vomiting she is also very concerned because over the past year and a half she has had problems with significant urgency and fecal soilage. She says she has to wear depends. She does not have complete incontinence but soilage several times per day. She says on average she is having 5-6 bowel movements per day of the narrow very soft stools. She also has history of lactose intolerance. She has been treated in the past with Imodium and that until. Patient does feel that Bentyl has worked in the past though  she's not been taking in over the past year  Review of patient's chart says shows that she has had problems with diarrhea and IBS long-term. She did have colonoscopy last  in March of 2008 per Dr. Jarold Motto and at that time had random biopsies to rule out microscopic colitis and these were negative. She also had EGD with small bowel biopsies to rule out celiac disease and these were negative. She had an adenomatous polyp at that time which was removed. Unfortunately she had complication of a post polypectomy bleed, required a repeat colonoscopy and Endo clipping which was done per Dr. Arlyce Dice. Patient is very concerned at this point and feels that she needs another colonoscopy .  CT scan of the abdomen and pelvis done on 04/03/2012 shows diverticulosis but otherwise negative exam.   Past Medical History  Diagnosis Date  . COPD (chronic obstructive pulmonary disease)   . Asthma   . ALLERGIC RHINITIS   . Hypertension   . Pleural effusion, left   . Colles' fracture of left radius   . Anxiety   . Back pain of thoracolumbar region     right  . Pruritus   . Diastolic dysfunction   . Pulmonary nodule   . IBS (irritable bowel syndrome)   . Bronchiectasis   . Insomnia   . History of pneumonia   . Diverticulosis   . Hx of colonic polyp   . Multiple rib fractures 10/2008    bilateral    Past Surgical History  Procedure Date  . Tonsillectomy   . Appendectomy   . Cholecystectomy   . Polypectomy   . Bladder suspension 2005    A-P  . Rotator cuff repair 2006    right  .  Orif wrist fracture 2010    left wrist.  Dr. Mina Marble.  . Total knee arthroplasty 08/2010    right    Prior to Admission medications   Medication Sig Start Date End Date Taking? Authorizing Provider  acetaminophen (TYLENOL) 500 MG tablet Take 500 mg by mouth 2 (two) times daily as needed.    Yes Historical Provider, MD  aspirin EC 81 MG tablet Take 1 tablet (81 mg total) by mouth daily. 07/06/11  Yes Laurey Morale, MD  Bimatoprost (LUMIGAN) 0.01 % SOLN Place 1 drop into both eyes daily.     Yes Historical Provider, MD  budesonide-formoterol (SYMBICORT) 80-4.5 MCG/ACT inhaler Inhale 2  puffs into the lungs 2 (two) times daily. 09/14/11 09/13/12 Yes [name], MD  colesevelam (WELCHOL) 625 MG tablet Take 625 mg by mouth daily.   Yes Historical Provider, MD  dicyclomine (BENTYL) 10 MG capsule Take 1 tablet by mouth three times a day as needed for urgency. 04/03/12  Yes [name], MD  ibuprofen (ADVIL,MOTRIN) 100 MG tablet Take 100 mg by mouth every 6 (six) hours as needed.     Yes Historical Provider, MD  Loperamide HCl (IMODIUM A-D) 1 MG/7.5ML LIQD Take by mouth as directed 06/03/11  Yes Amy S Esterwood, PA  Multiple Vitamin (MULTIVITAMIN) tablet Take by mouth daily.     Yes Historical Provider, MD  valsartan-hydrochlorothiazide (DIOVAN-HCT) 160-12.5 MG per tablet Take 1 tablet by mouth daily. 04/25/11  Yes [name], MD  zolpidem (AMBIEN) 10 MG tablet TAKE ONE TABLET AT BEDTIME AS NEEDED 03/30/12  Yes [name], MD  esomeprazole (NEXIUM) 40 MG capsule Take 1 capsule (40 mg total) by mouth daily. 02/04/11 02/04/12  [name], MD  nitroGLYCERIN (NITROSTAT) 0.4 MG SL tablet Place 1 tablet (0.4 mg total) under the tongue every 5 (five) minutes as needed for chest pain. 08/29/11 08/28/12  Laurey Morale, MD  zolpidem (AMBIEN) 10 MG tablet Take 1 tablet (10 mg total) by mouth at bedtime as needed for sleep. 06/03/11 07/03/11  Amy Oswald Hillock, PA    Current Facility-Administered Medications  Medication Dose Route Frequency Provider Last Rate Last Dose  . acetaminophen (TYLENOL) tablet 500 mg  500 mg Oral BID PRN [name], MD      . albuterol (PROVENTIL) (5 MG/ML) 0.5% nebulizer solution 2.5 mg  2.5 mg Nebulization Once [name], MD   2.5 mg at 04/05/12 1018  . ALPRAZolam (XANAX) tablet 0.25 mg  0.25 mg Oral TID PRN [name], MD      . atorvastatin (LIPITOR) tablet 10 mg  10 mg Oral q1800 [name], MD      . bimatoprost (LUMIGAN) 0.01 % ophthalmic solution 1 drop  1 drop Both Eyes QHS [name], MD   1 drop at 04/04/12  2150  . budesonide-formoterol (SYMBICORT) 80-4.5 MCG/ACT inhaler 2 puff  2 puff Inhalation BID [name], MD   2 puff at 04/05/12 1110  . cloNIDine (CATAPRES) tablet 0.1 mg  0.1 mg Oral Q4H PRN [name], MD   0.1 mg at 04/04/12 1721  . dextrose 5 %-0.45 % sodium chloride infusion   Intravenous Continuous [name], MD 75 mL/hr at 04/05/12 0106    . dicyclomine (BENTYL) capsule 10 mg  10 mg Oral TID PRN [name], MD      . enoxaparin (LOVENOX) injection 30 mg  30 mg Subcutaneous Q24H Rosalyn Gess Norins,  MD   30 mg at 04/04/12 1216  . hydrochlorothiazide (MICROZIDE) capsule 12.5 mg  12.5 mg Oral Daily [name], MD   12.5 mg at 04/04/12 1216   And  . irbesartan (AVAPRO) tablet 150 mg  150 mg Oral Daily [name], MD   150 mg at 04/04/12 1216  . hydrocortisone-pramoxine (ANALPRAM-HC) 2.5-1 % rectal cream   Rectal TID PRN [name], MD      . iohexol (OMNIPAQUE) 300 MG/ML solution 100 mL  100 mL Intravenous Once PRN Medication Radiologist, MD   100 mL at 04/04/12 1602  . loperamide (IMODIUM) 1 MG/5ML solution 1 mg  1 mg Oral TID PRN [name], MD      . nitroGLYCERIN (NITROSTAT) SL tablet 0.4 mg  0.4 mg Sublingual Q5 min PRN [name], MD      . pantoprazole (PROTONIX) EC tablet 40 mg  40 mg Oral Daily [name], MD   40 mg at 04/04/12 1216  . sodium chloride 0.9 % injection 3 mL  3 mL Intravenous Q12H [name], MD      . zolpidem [facility]) tablet 5 mg  5 mg Oral QHS [name], MD   5 mg at 04/04/12 2113    Allergies as of 04/03/2012 - Review Complete 04/03/2012  Allergen Reaction Noted  . Amoxicillin  11/24/2006  . Penicillins  06/12/2007  . Sulfonamide derivatives  06/12/2007    Family History  Problem Relation Age of Onset  . Heart disease Mother   . Colon cancer Neg Hx   . Bipolar disorder Daughter   . Rheumatic fever Mother   . Pneumonia Father     History   Social History  . Marital Status:  Married    Spouse Name: N/A    Number of Children: 3  . Years of Education: N/A   Occupational History  . retired    Social History Main Topics  . Smoking status: Former Smoker -- 0.3 packs/day for 10 years    Types: Cigarettes    Quit date: 09/19/1968  . Smokeless tobacco: Never Used  . Alcohol Use: Yes     rarely  . Drug Use: No  . Sexually Active: No   Other Topics Concern  . Not on file   Social History Narrative   College - [employer]; [place] - [place]. Married 1959 - spouse s/p valve surgery with resulting paralysis hemidiaphragm ('10). 3 daughter, 5 grandsons, 4 granddaughters.  1 daughter bipolar - social issues. She remains very active, but can't play tennis. Pt was apprently ex-smoker, but husband does not recollect pt ever smoking. [place]ful [place] (fall '09) and now moving to [place] (fall '12).    Review of Systems: Pertinent positive and negative review of systems were noted in the above HPI section.  All other review of systems was otherwise negative. Physical Exam: Vital signs in last 24 hours: Temp:  [97.4 F (36.3 C)-98 F (36.7 C)] 98 F (36.7 C) (07/18 0508) Pulse Rate:  [61-68] 61  (07/18 0508) Resp:  [16-18] 16  (07/18 0508) BP: (127-191)/(73-94) 127/76 mmHg (07/18 0508) SpO2:  [95 %-97 %] 96 % (07/18 0508) Weight:  [138 lb 7.2 oz (62.8 kg)] 138 lb 7.2 oz (62.8 kg) (07/18 0508) Last BM Date: 04/04/12 General:   Alert,  Well-developed, well-nourished, pleasant and cooperative in NAD Head:  Normocephalic and atraumatic. Eyes:  Sclera clear, no icterus.   Conjunctiva  pink. Ears:  Normal auditory acuity. Nose:  No deformity, discharge,  or lesions. Mouth:  No deformity or lesions.   Neck:  Supple; no masses or thyromegaly. Lungs:  Clear throughout to auscultation.   No wheezes, crackles, or rhonchi . Heart:  Regular rate and rhythm; no murmurs, clicks, rubs,  or gallops. Abdomen:  Soft,nontender, BS active,nonpalp mass or hsm.    Rectal:  Deferred ,stool documented heme negative here Msk:  Symmetrical without gross deformities. . Pulses:  Normal pulses noted. Extremities:  Without clubbing or edema. Neurologic:  Alert and  oriented x4;  grossly normal neurologically. Skin:  Intact without significant lesions or rashes.. Psych:  Alert and cooperative. Normal mood and affect.  Intake/Output from previous day: 07/17 0701 - 07/18 0700 In: 1408.8 [I.V.:1408.8] Out: 952 [Urine:950; Stool:2] Intake/Output this shift: Total I/O In: 340 [P.O.:340] Out: -   Lab Results:  Basename 04/04/12 1105  WBC 6.4  HGB 14.6  HCT 43.0  PLT 225   BMET  Basename 04/04/12 1105  NA 135  K 4.1  CL 102  CO2 24  GLUCOSE 80  BUN 16  CREATININE 0.67  CALCIUM 9.2   LFT  Basename 04/04/12 1105  PROT 6.7  ALBUMIN 3.8  AST 18  ALT 22  ALKPHOS 89  BILITOT 0.5  BILIDIR --  IBILI --   PT/INR No results found for this basename: LABPROT:2,INR:2 in the last 72 hours Hepatitis Panel No results found for this basename: HEPBSAG,HCVAB,HEPAIGM,HEPBIGM in the last 72 hours    Studies/Results: X-ray Chest Pa And Lateral   04/04/2012  *RADIOLOGY REPORT*  Clinical Data: Shortness of breath.  CHEST - 2 VIEW  Comparison: September 14, 2011.  Findings: Cardiomediastinal silhouette appears normal.  No acute pulmonary disease is noted.  Bony thorax appears intact.  IMPRESSION: No acute cardiopulmonary abnormality seen.  Original Report Authenticated By: Venita Sheffield., M.D.   Ct Abdomen Pelvis W Contrast  04/04/2012  *RADIOLOGY REPORT*  Clinical Data: Left lower quadrant and epigastric abdominal pain for 2 weeks.  Diarrhea and weakness. Hematochezia.  CT ABDOMEN AND PELVIS WITH CONTRAST  Technique:  Multidetector CT imaging of the abdomen and pelvis was performed following the standard protocol during bolus administration of intravenous contrast.  Contrast: OMNIPAQUE IOHEXOL 300 MG/ML  SOLN  Comparison: CT scan dated  02/11/2011  Findings: The spleen, pancreas, adrenal glands, and kidneys demonstrate no significant abnormalities.  Multiple small benign- appearing cysts in both kidneys.  No renal obstruction.  The gallbladder has been removed.  This secondary slight dilatation of the common bile ducts, within normal limits for a post cholecystectomy patient.  1 cm cyst in the dome of the left lobe of the liver, unchanged. Liver parenchyma is otherwise normal.  There are a few diverticula in the sigmoid portion of the colon. Small bowel appears normal.  Uterus and ovaries are normal.  No acute osseous abnormality.  IMPRESSION: Benign-appearing abdomen and pelvis.  Original Report Authenticated By: Gwynn Burly, M.D.    IMPRESSION:  #78 78 year old female admitted with a two-week history of diarrhea, and left-sided abdominal discomfort. CT scan is negative,, I don't think she had diverticulitis, wonder if she had an episode of ischemic colitis which is resolving . #2 chronic IBS/diarrhea predominant symptoms with previous negative workup for celiac disease and microscopic colitis . I suspect IBS is responsible for her ongoing complaints of frequent stools. #3 history of adenomatous colon polyps; last colonoscopy 5 years ago- patient concerned with change in  bowel habits and narrow caliber stools, will rule out occult colon lesion #4 fecal seepage- fortunately she does not have leak incontinence, symptoms likely secondary to pelvic floor laxation and sphincter incompetence.  Plan; stool culture and stool for C. difficile PCR Will schedule for colonoscopy with Dr. Rhea Belton for Friday a.m. This was discussed in detail with the patient and she is agreeable to proceed, She may be able to be discharged Friday afternoon, will restart  Bentyl 10 mg twice daily on discharge and have encouraged her to take this on a regular basis, would also add a daily probiotic, Align would be good, and Citrucel once daily to bulk her stools  .  I held the imodium and stopped lovenox- for procedure.   Amy Esterwood  04/05/2012, 11:57 AM   Addendum: Patient seen, examined, and I agree with the above documentation, including the assessment and plan.

## 2012-04-05 NOTE — Telephone Encounter (Signed)
Olegario Messier is calling again regarding the status of this pt. She was advised via Call A Nurse that MEN is currently at [facility] per Fannie Knee, LPN.

## 2012-04-06 ENCOUNTER — Encounter (HOSPITAL_COMMUNITY): Payer: Self-pay

## 2012-04-06 ENCOUNTER — Encounter (HOSPITAL_COMMUNITY)
Admission: AD | Disposition: A | Discharge status: Home / Self Care | Payer: Self-pay | Source: Ambulatory Visit | Attending: Internal Medicine

## 2012-04-06 DIAGNOSIS — Z86010 Personal history of colonic polyps: Secondary | ICD-10-CM

## 2012-04-06 DIAGNOSIS — R197 Diarrhea, unspecified: Secondary | ICD-10-CM

## 2012-04-06 DIAGNOSIS — R198 Other specified symptoms and signs involving the digestive system and abdomen: Secondary | ICD-10-CM

## 2012-04-06 HISTORY — PX: COLONOSCOPY: SHX5424

## 2012-04-06 SURGERY — COLONOSCOPY
Anesthesia: Moderate Sedation

## 2012-04-06 MED ORDER — SODIUM CHLORIDE 0.9 % IV SOLN: Freq: Once | INTRAVENOUS | Status: DC

## 2012-04-06 MED ORDER — MIDAZOLAM HCL 10 MG/2ML IJ SOLN
INTRAMUSCULAR | Status: AC
  Filled 2012-04-06: qty 2

## 2012-04-06 MED ORDER — FENTANYL CITRATE 0.05 MG/ML IJ SOLN
INTRAMUSCULAR | Status: AC
  Filled 2012-04-06: qty 2

## 2012-04-06 MED ORDER — MIDAZOLAM HCL 5 MG/5ML IJ SOLN
INTRAMUSCULAR | Status: DC | PRN
  Administered 2012-04-06 (×3): 2 mg via INTRAVENOUS
  Administered 2012-04-06: 1 mg via INTRAVENOUS

## 2012-04-06 MED ORDER — SPOT INK MARKER SYRINGE KIT
PACK | SUBMUCOSAL | Status: DC | PRN
  Administered 2012-04-06: 2.5 mL via SUBMUCOSAL

## 2012-04-06 MED ORDER — SPOT INK MARKER SYRINGE KIT
PACK | SUBMUCOSAL | Status: AC
  Filled 2012-04-06: qty 5

## 2012-04-06 MED ORDER — FENTANYL CITRATE 0.05 MG/ML IJ SOLN
INTRAMUSCULAR | Status: DC | PRN
  Administered 2012-04-06: 15 ug via INTRAVENOUS
  Administered 2012-04-06 (×2): 25 ug via INTRAVENOUS
  Administered 2012-04-06: 10 ug via INTRAVENOUS

## 2012-04-06 MED ORDER — BUDESONIDE-FORMOTEROL FUMARATE 160-4.5 MCG/ACT IN AERO: 2.0000 | INHALATION_SPRAY | Freq: Two times a day (BID) | RESPIRATORY_TRACT | Status: DC

## 2012-04-06 MED ORDER — ALBUTEROL SULFATE HFA 108 (90 BASE) MCG/ACT IN AERS: 2.0000 | INHALATION_SPRAY | Freq: Four times a day (QID) | RESPIRATORY_TRACT | Status: DC | PRN

## 2012-04-06 NOTE — Progress Notes (Signed)
Patient is now awake and is agreeable to drinking second half of MoviPrep for colonoscopy this am. Prep placed at bedside for pt to drink. Lori [name]

## 2012-04-06 NOTE — Progress Notes (Signed)
Patient awake and asking for water-reinstructed regarding NPO status and patient got "sip" of water from sink in room. Patient again refusing to take second part of MoviPrep despite stating earlier that she would take it at 1am. States "I'm still just exhausted. I'll take that at 6:30 in the morning". Lori [name]

## 2012-04-06 NOTE — Op Note (Signed)
[facility] [address] [place], [place]  [address]  COLONOSCOPY PROCEDURE REPORT  PATIENT:  Lori, [name]  MR#:  [id number] BIRTHDATE:  03/16/1925, 87 yrs. old  GENDER:  female ENDOSCOPIST:  Carie Caddy. [name], MD REF. BY:  Rosalyn Gess. Norins, M.D. PROCEDURE DATE:  04/06/2012 PROCEDURE:  Colonoscopy with biopsy, Colon with cold biopsy polypectomy, Colonoscopy with submucosal injection ASA CLASS:  Class III INDICATIONS:  change in bowel habits, Abdominal pain, unexplained diarrhea MEDICATIONS:   Fentanyl 75 mcg IV, Versed 7 mg IV  DESCRIPTION OF PROCEDURE:   After the risks benefits and alternatives of the procedure were thoroughly explained, informed consent was obtained.  Digital rectal exam was performed and revealed no rectal masses.   The Pentax Ped Colon [id number] endoscope was introduced through the anus and advanced to the cecum, which was identified by both the appendix and ileocecal valve, without limitations.  The quality of the prep was excellent, using MoviPrep.  The instrument was then slowly withdrawn as the colon was fully examined. <<PROCEDUREIMAGES>>  FINDINGS:  A 2 cm sessile polyp  extending along and on either side of a fold was found in the ascending colon. Multiple biopsies were obtained and sent to pathology. Submucosal injection was performed proximal and distal to the polyp with [place] ink for tattooing. 2.5 cc total  A 4 mm sessile polyp was found in the mid transverse colon. The polyp was removed using cold biopsy forceps. Mild diverticulosis was found in the left colon. The diverticulosis in the sigmoid colon cause some narrowing with sharp angulation.   Retroflexed views in the rectum revealed internal hemorrhoids.     The scope was then withdrawn from the cecum and the procedure completed.  COMPLICATIONS:  None ENDOSCOPIC IMPRESSION: 1) Sessile polyp in the ascending colon. Multiple biopsies performed. Tattooed. 2) Sessile polyp in the mid  transverse colon. Removed and sent to pathology 3) Mild diverticulosis in the left colon 4) Small Internal hemorrhoids RECOMMENDATIONS: 1) Await pathology results 2) High fiber diet. 3) Discussion about how to proceed once pathology results available.  Carie Caddy. Rhea Belton, MD  CC:  The Patient [name], MD  n. [name]. [name] at 04/06/2012 11:05 AM  [place], [name], [id number]

## 2012-04-06 NOTE — Progress Notes (Signed)
Patient ID: Lori [name], female   DOB: 03/16/1925, 78 y.o.   MRN: [id number] [facility] Progress Note  Subjective: Finished prep early this am, and stools clear- no bleeding- wants to go home after procedure...  Objective:  Vital signs in last 24 hours: Temp:  [97.4 F (36.3 C)-97.6 F (36.4 C)] 97.4 F (36.3 C) (07/19 0454) Pulse Rate:  [69-89] 69  (07/19 0454) Resp:  [16-19] 19  (07/19 0454) BP: (123-147)/(68-77) 123/68 mmHg (07/19 0454) SpO2:  [96 %-97 %] 96 % (07/19 0759) Weight:  [131 lb 13.4 oz (59.8 kg)] 131 lb 13.4 oz (59.8 kg) (07/19 0500) Last BM Date: 04/04/12 General:   Alert,  Well-developed,    in NAD Heart:  Regular rate and rhythm; no murmurs Pulm; Abdomen:  Soft, nontender and nondistended. Normal bowel sounds, without guarding, and without rebound.   Extremities:  Without edema. Neurologic:  Alert and  oriented x4;  grossly normal neurologically. Psych:  Alert and cooperative. Normal mood and affect.  Intake/Output from previous day: 07/18 0701 - 07/19 0700 In: 3630 [P.O.:880; I.V.:750] Out: -  Intake/Output this shift:    Lab Results:  Basename 04/04/12 1105  WBC 6.4  HGB 14.6  HCT 43.0  PLT 225   BMET  Basename 04/04/12 1105  NA 135  K 4.1  CL 102  CO2 24  GLUCOSE 80  BUN 16  CREATININE 0.67  CALCIUM 9.2   LFT  Basename 04/04/12 1105  PROT 6.7  ALBUMIN 3.8  AST 18  ALT 22  ALKPHOS 89  BILITOT 0.5  BILIDIR --  IBILI --   IMP/PLan;  78 yo female with chronic diarrhea/IBS with acute exacerbation  Associated with LLQ pain now resolved. Pt also with incompetent anal sphincter, hx adenomatous polyps  For colonoscopy this am , hopefully home later today Resume Bentyl one qam, every day then second dose as needed daily Align one daily Citrocel daily    Active Problems:  * No active hospital problems. *      LOS: 2 days   Amy Esterwood  04/06/2012, 9:13 AM

## 2012-04-06 NOTE — Progress Notes (Signed)
Patient has complete colonoscopy - report reviewed. She had PFTs results pending. She is stable and ready for discharge.  Dictated # [id number]

## 2012-04-06 NOTE — Progress Notes (Signed)
Patient seen, examined, and I agree with the above documentation, including the assessment and plan. Colonoscopy completed. Colonoscopy revealed ascending colon polyp, approximately 2 mm in size. Biopsies performed. 1 small transverse colon polyp removed. Patient has left-sided diverticulosis. Small internal hemorrhoids Due to the patient's prior polypectomy and subsequent post-polypectomy bleeding, I elected to biopsy the right colon polyp. I have discussed the options for how to deal with this polyp with the patient and her husband. The options are to do nothing based on her age and the fact that this lesion will likely grow slowly, repeat the colonoscopy with the knowledge that the resection would be piecemeal and carry a risk of post polypectomy bleeding and perforation, or referral for surgical resection of the ascending colon. The patient and her husband will think this over, and they wish to discuss this with Dr. Debby Bud.  We'll see her back in clinic to discuss once pathology results are available. Regarding her IBS, intermittent loose stools, and left-sided abdominal pain. She should continue on a psyllium-based medication twice daily to help bulk inform her stools. I recommended daily probiotic, Align.  She can continue to use the Bentyl when necessary for spasm and pain. Her symptoms also seem to relate to lactose containing foods, and I recommended a trial of over-the-counter Lactaid. She can use Lactaid with meals that contain lactose/dairy products.

## 2012-04-07 NOTE — Discharge Summary (Signed)
Lori [name], Lori [name] NO.:  [id number]  MEDICAL RECORD NO.:  [id number]  LOCATION:  1407                         FACILITY:  [facility]  PHYSICIAN:  Rosalyn Gess. Ovadia Lopp, MD  DATE OF BIRTH:  03/16/1925  DATE OF ADMISSION:  04/04/2012 DATE OF DISCHARGE:  04/06/2012                              DISCHARGE SUMMARY   ADMITTING DIAGNOSIS: 1. Diarrhea with dark black stools with occasional frank blood. 2. Increasing shortness of breath. 3. Hypertension. 4. Diastolic dysfunction.  DISCHARGE DIAGNOSES: 1. Gastrointestinal colon polyp with change in bowel habit. 2. Pulmonary.  The patient is stable, status post pulmonary function     studies. 3. Hypertension, stable. 4. Diastolic dysfunction, stable.  CONSULTANTS:  Erick Blinks, MD.  PROCEDURES:  CT scan of the abdomen and pelvis performed on April 04, 2012, which showed normal pancreas, adrenal glands, kidneys, and liver. Gallbladder is removed.  Slightly dilated common bile duct within normal limits post cholecystectomy.  Few scattered diverticula in the sigmoid colon.  Small bowel appears normal.  Uterus and ovaries are normal.  No acute osseous abnormalities noted.  Two view chest on April 04, 2012, with no acute cardiopulmonary abnormality seen.  Colonoscopy, the patient had a 2 cm sessile polyp, extending along on either side of the fold in the ascending colon.  Multiple biopsies were obtained.  Submucosal injection was performed in proximal and distal. The polyp was in the ink for tattooing.  A 4 mm sessile polyp was found in the transverse colon, a polyp was removed using cold biopsy forceps. Path reports are pending at this time.  Pulmonary function studies, full studies performed and results are pending.  HISTORY OF PRESENT ILLNESS:  Lori [name] is a delightful 78 year old woman with a history of pulmonary disease with COPD and bronchiectasis, history of hypertension, history of diastolic heart dysfunction,  and history of irritable bowel syndrome.  The patient had been in her usual state of health until 2 weeks prior to admission when she developed significant abdominal pain, diarrhea with dark black stools, chest pain, increased shortness of breath, weakness, and fatigue.  She had been seen by her local physician in [facility] and was treated with Cipro and Flagyl for possible diverticulitis, but her symptoms did persist. Because of her symptoms, the patient returned to [facility] for further evaluation and given that description of her symptoms, there was a high concern for GI bleed.  She was admitted to the hospital.  Please see the H and P for past medical history, family history, social history, and admission exam.  HOSPITAL COURSE: 1. GI bleed.  The patient had no significant diarrheal stools while in     the hospital.  CT scan was unremarkable.  Stool was heme negative.     The patient was seen in consultation by the GI service, Dr. Rhea Belton,     and was taken for colonoscopy on the morning of the day of     discharge.  This did reveal the patient to have a sessile polyp     that was biopsied with results pending.  She had a second small     polyp that was removed.  No malignancy was found.  No sites of     active bleeding were found.  The patient has had ongoing symptoms of abdominal discomfort. Recommendations from gastroenterology service are for her to take a bulk laxative daily, to use a probiotic daily, to take an antispasmodic on an as-needed basis, and to take Lactaid if she is going to consume milk products.  She is requested to see Dr. Rhea Belton in followup when she returns from [facility] in August.  2. Pulmonary.  The patient has had full pulmonary function studies and     results are pending.  She reports her breathing has been stable,     but she does do well with an albuterol rescue.  Plan; the patient is to continue with Symbicort, but we will make sure she is on  maximum dose.  She will use albuterol for breakthrough.  She will be notified of PFTs when available.  She is asked to see Dr. Marchelle Gearing in followup in August or early September.  3. Cardiovascular.  The patient's blood pressure and cardiac function     remained stable during this hospitalization.  She will continue all     of her home medications.  With the patient having had a full evaluation with her hemoglobin being stable with no signs of active bleeding with colonoscopy being completed with PFTs being done, the patient at this point is ready for discharge to home.  DISCHARGE EXAMINATION:  VITAL SIGNS:  Temperature was 97.4, blood pressure 140/72, pulse 65, respirations 16, oxygen saturation is 99% on room air. GENERAL APPEARANCE:  This is a well-nourished, well-developed pleasant woman in no acute distress.  HEENT:  Unremarkable. PULMONARY:  The patient has no increased work of breathing.  No audible wheezing. CARDIOVASCULAR:  The patient has regular rate and rhythm.  She is hemodynamically stable. ABDOMEN:  Soft.  No further examination conducted.  FINAL LABORATORY:  Cardiac enzymes were negative x3.  BNP at admission was 161.7.  Chemistries on April 04, 2012, with sodium 135, potassium 4.1, chloride 102, CO2 of 24, BUN 16, creatinine 0.67, glucose was 80. Liver functions were normal.  Iron studies were performed with an iron level of 118, iron percent saturation was 42%, folate was 16.2, B12 was 1028.  CBC from the day of admission with a white count of 6400, hemoglobin 14.6 g, platelet count 225,000 normal differential.  DISPOSITION:  The patient is discharged home.  She will continue on all of her home medications.  We will make sure she is on a bowel regimen that includes a bulk laxative probiotic and antispasmodics.  She is asked to see Dr. Rhea Belton in August when she returns to town.  She asked to see Dr. Debby Bud, her primary care when she comes back to town in August.   She is to call sooner or on an as-needed basis.  CONDITION AT TIME OF DISCHARGE DICTATION:  The patient's condition at time of discharge dictation is stable and improved.     Rosalyn Gess Audrena Talaga, MD     MEN/MEDQ  D:  04/06/2012  T:  04/07/2012  Job:  [id number]

## 2012-04-09 ENCOUNTER — Encounter (HOSPITAL_COMMUNITY): Payer: Self-pay | Admitting: Internal Medicine

## 2012-04-12 ENCOUNTER — Encounter: Payer: Self-pay | Admitting: Internal Medicine

## 2012-05-14 ENCOUNTER — Encounter: Payer: Self-pay | Admitting: Internal Medicine

## 2012-05-15 ENCOUNTER — Encounter: Payer: Self-pay | Admitting: Internal Medicine

## 2012-05-15 ENCOUNTER — Ambulatory Visit (INDEPENDENT_AMBULATORY_CARE_PROVIDER_SITE_OTHER): Payer: Medicare Other | Admitting: Internal Medicine

## 2012-05-15 VITALS — BP 120/60 | HR 80 | Ht 64.0 in | Wt 137.4 lb

## 2012-05-15 DIAGNOSIS — R197 Diarrhea, unspecified: Secondary | ICD-10-CM

## 2012-05-15 DIAGNOSIS — D126 Benign neoplasm of colon, unspecified: Secondary | ICD-10-CM

## 2012-05-15 DIAGNOSIS — K589 Irritable bowel syndrome without diarrhea: Secondary | ICD-10-CM

## 2012-05-15 DIAGNOSIS — K635 Polyp of colon: Secondary | ICD-10-CM

## 2012-05-15 MED ORDER — ALIGN PO CAPS: 1.0000 | ORAL_CAPSULE | Freq: Every day | ORAL | Status: DC

## 2012-05-15 MED ORDER — LOPERAMIDE HCL 1 MG/7.5ML PO LIQD: ORAL | Status: DC

## 2012-05-15 NOTE — Patient Instructions (Addendum)
You will be contacted for your referrals to [facility] for possible segmental  Resection for colon polyp and [facility] to discuss polypectomy  You have been given samples of Align, take on capsule daily  Take loperamide 30 min before lunch.

## 2012-05-16 ENCOUNTER — Encounter: Payer: Self-pay | Admitting: Internal Medicine

## 2012-05-16 ENCOUNTER — Telehealth: Payer: Self-pay | Admitting: Gastroenterology

## 2012-05-16 NOTE — Progress Notes (Signed)
Patient ID: Lori [name], female   DOB: 03/16/1925, 78 y.o.   MRN: [id number]  SUBJECTIVE: HPI Mrs. Guandique is an 78 yo female with PMH of mild COPD, hypertension, IBS, and history of adenomatous colon polyps who is seen in followup after undergoing colonoscopy while hospitalized in July 2013 for evaluation of change in bowel habits. She is accompanied today by her husband. She reports she is doing well, though she continues to have loose stools associated intermittently with urgency. The urgent stools usually happen after lunch, but tend to bother her less in the morning and in the evening. She's having 3-5 stools a day which are loose, but she denies frank diarrhea or constipation. No melena or rectal bleeding. Some associated abdominal cramping but no pain. She is eating well. No nausea or vomiting. No fevers or chills.  Review of Systems  As per history of present illness, otherwise negative   Past Medical History  Diagnosis Date  . COPD (chronic obstructive pulmonary disease)   . Asthma   . ALLERGIC RHINITIS   . Hypertension   . Pleural effusion, left   . Colles' fracture of left radius   . Anxiety   . Back pain of thoracolumbar region     right  . Pruritus   . Diastolic dysfunction   . Pulmonary nodule   . IBS (irritable bowel syndrome)   . Bronchiectasis   . Insomnia   . History of pneumonia   . Diverticulosis   . Hx of colonic polyp   . Multiple rib fractures 10/2008    bilateral    Current Outpatient Prescriptions  Medication Sig Dispense Refill  . acetaminophen (TYLENOL) 500 MG tablet Take 500 mg by mouth 2 (two) times daily as needed.       Marland Kitchen albuterol (PROVENTIL HFA;VENTOLIN HFA) 108 (90 BASE) MCG/ACT inhaler Inhale 2 puffs into the lungs every 6 (six) hours as needed for wheezing.  1 Inhaler  2  . aspirin EC 81 MG tablet Take 1 tablet (81 mg total) by mouth daily.      . Bimatoprost (LUMIGAN) 0.01 % SOLN Place 1 drop into both eyes daily.        . budesonide-formoterol  (SYMBICORT) 160-4.5 MCG/ACT inhaler Inhale 2 puffs into the lungs 2 (two) times daily.  1 Inhaler  12  . colesevelam (WELCHOL) 625 MG tablet Take 625 mg by mouth daily.      Marland Kitchen dicyclomine (BENTYL) 10 MG capsule Take 1 tablet by mouth three times a day as needed for urgency.  90 capsule  3  . ibuprofen (ADVIL,MOTRIN) 100 MG tablet Take 100 mg by mouth every 6 (six) hours as needed.        . Loperamide HCl (IMODIUM A-D) 1 MG/7.5ML LIQD Take by mouth as directed  150 mL  0  . Multiple Vitamin (MULTIVITAMIN) tablet Take by mouth daily.        . nitroGLYCERIN (NITROSTAT) 0.4 MG SL tablet Place 1 tablet (0.4 mg total) under the tongue every 5 (five) minutes as needed for chest pain.  100 tablet  3  . valsartan-hydrochlorothiazide (DIOVAN-HCT) 160-12.5 MG per tablet Take 1 tablet by mouth daily.  90 tablet  3  . zolpidem (AMBIEN) 10 MG tablet TAKE ONE TABLET AT BEDTIME AS NEEDED  30 tablet  5  . bifidobacterium infantis (ALIGN) capsule Take 1 capsule by mouth daily.  28 capsule  0  . esomeprazole (NEXIUM) 40 MG capsule Take 1 capsule (40 mg total) by mouth  daily.  30 capsule  3  . zolpidem (AMBIEN) 10 MG tablet Take 1 tablet (10 mg total) by mouth at bedtime as needed for sleep.  30 tablet  0    Allergies  Allergen Reactions  . Amoxicillin     REACTION: unspecified  . Penicillins     REACTION: causes hives  . Sulfonamide Derivatives Nausea Only    Family History  Problem Relation Age of Onset  . Heart disease Mother   . Colon cancer Neg Hx   . Bipolar disorder Daughter   . Rheumatic fever Mother   . Pneumonia Father     History  Substance Use Topics  . Smoking status: Former Smoker -- 0.3 packs/day for 10 years    Types: Cigarettes    Quit date: 09/19/1968  . Smokeless tobacco: Never Used  . Alcohol Use: Yes     rarely    OBJECTIVE: BP 120/60  Pulse 80  Ht 5\' 4"  (1.626 m)  Wt 137 lb 6.4 oz (62.324 kg)  BMI 23.58 kg/m2 Constitutional: Well-developed and well-nourished. No  distress. HEENT: Normocephalic and atraumatic. Oropharynx is clear and moist. No oropharyngeal exudate. Conjunctivae are normal.  No scleral icterus. Cardiovascular: Normal rate, regular rhythm and intact distal pulses. No M/R/G Pulmonary/chest: Effort normal and breath sounds normal. No wheezing, rales or rhonchi. Abdominal: Soft, nontender, nondistended. Bowel sounds active throughout. There are no masses palpable. No hepatosplenomegaly. Extremities: no clubbing, cyanosis, or edema Psychiatric: Normal mood and affect. Behavior is normal.  Labs and Imaging -- CBC    Component Value Date/Time   WBC 6.4 04/04/2012 1105   RBC 5.01 04/04/2012 1105   HGB 14.6 04/04/2012 1105   HCT 43.0 04/04/2012 1105   PLT 225 04/04/2012 1105   MCV 85.8 04/04/2012 1105   MCH 29.1 04/04/2012 1105   MCHC 34.0 04/04/2012 1105   RDW 13.8 04/04/2012 1105   LYMPHSABS 1.8 04/04/2012 1105   MONOABS 0.3 04/04/2012 1105   EOSABS 0.1 04/04/2012 1105   BASOSABS 0.0 04/04/2012 1105    CMP     Component Value Date/Time   NA 135 04/04/2012 1105   K 4.1 04/04/2012 1105   CL 102 04/04/2012 1105   CO2 24 04/04/2012 1105   GLUCOSE 80 04/04/2012 1105   BUN 16 04/04/2012 1105   CREATININE 0.67 04/04/2012 1105   CALCIUM 9.2 04/04/2012 1105   PROT 6.7 04/04/2012 1105   ALBUMIN 3.8 04/04/2012 1105   AST 18 04/04/2012 1105   ALT 22 04/04/2012 1105   ALKPHOS 89 04/04/2012 1105   BILITOT 0.5 04/04/2012 1105   GFRNONAA 78* 04/04/2012 1105   GFRAA >90 04/04/2012 1105   Colonoscopy, performed 04/06/2012 --  2 mm sessile polyp extending along and on either side of a fold in the ascending colon. Biopsy. Tattooed on both edges. 4 mm sessile polyp mid transverse colon. Removed with cold forceps. Diverticulosis left colon. Internal hemorrhoids seen on retroflexion. Pathology: Ascending colon polyp = serrated adenoma. No high-grade dysplasia or malignancy identified. Transverse colon polyp = polypoid colorectal mucosa. No adenomatous change or  malignancy.  ASSESSMENT AND PLAN: 78 yo female with PMH of mild COPD, hypertension, IBS, and history of adenomatous colon polyps who is seen in followup after undergoing colonoscopy while hospitalized in July 2013 for evaluation of change in bowel habits with discovery of 2 cm sessile adenoma in the ascending colon.  1. Sessile serrated adenoma ascending colon -- the patient has a history of prior polypectomy in the ascending colon in  2008 with subsequent post-polypectomy bleed. This required hospitalization but not transfusion. She recalls her hemoglobin decreased from 14 to 8 associated with this bleed. I am uncertain if the current adenoma found recently in the ascending colon arises from the prior polypectomy site or not. Either way, recent colonoscopy revealed a 2 cm sessile adenoma in the ascending colon. I elected to not to remove this given her prior complication, and decided to bring her to clinic to discuss options. We had a very long discussion today with her and her husband regarding her options which are as follows: 1. do not perform repeat colonoscopy or polypectomy given her age, she is aware that this polyp is premalignant and if she lives long enough may in fact develop into cancer. 2. Repeat colonoscopy for polypectomy, which likely would be in piecemeal fashion. If so, she would need repeat colonoscopy 3-6 months after piecemeal polypectomy to ensure complete resection. She is aware that this option carries risks of bleeding, perforation, incomplete resection, and the risk associated with sedation. 3. Segmental surgical resection of her ascending colon.  After the thorough discussion, she would like a second opinion from a gastroenterologist at [facility] regarding endoscopic resection. I will refer her to Dr. Mike Gip for evaluation of polypectomy. I also will refer her to  [facility] to discuss the option of possible resection of her ascending colon and the risks  associated with this surgery including immediate operative risks and possible complications and recovery time.   2. IBS/loose stools/fecal urgency -- I think that her fecal urgency and loose stools are primarily related to her known IBS. We discussed how I do not think the polyp is causing the symptoms. For now we'll try her on Align one capsule daily and advised her to use loperamide 2 mg before lunch, since this seems to be a meal which triggers her fecal urgency and loose stools. We can assess her response at follow up.

## 2012-05-16 NOTE — Telephone Encounter (Signed)
Patient is scheduled to see Dr. Glenna Fellows on 06/07/12 @ 10:15am, arrive @ 9:45am. Pt has been notified by [facility] of appt.

## 2012-05-16 NOTE — Telephone Encounter (Signed)
Faxed record to Dr. Mike Gip @ [facility] Fax # [phone]

## 2012-05-29 ENCOUNTER — Other Ambulatory Visit: Payer: Self-pay | Admitting: Internal Medicine

## 2012-05-29 MED ORDER — VALSARTAN-HYDROCHLOROTHIAZIDE 160-12.5 MG PO TABS: 1.0000 | ORAL_TABLET | Freq: Every day | ORAL | Status: DC

## 2012-05-29 NOTE — Telephone Encounter (Signed)
Pt needs refill on Diovan 10mg , please call into [facility] [phone]  Please call pt once sent

## 2012-05-30 ENCOUNTER — Encounter: Payer: Self-pay | Admitting: Adult Health

## 2012-05-30 ENCOUNTER — Ambulatory Visit (INDEPENDENT_AMBULATORY_CARE_PROVIDER_SITE_OTHER): Payer: Medicare Other | Admitting: Adult Health

## 2012-05-30 VITALS — BP 140/80 | HR 82 | Temp 96.4°F | Ht 65.0 in | Wt 138.6 lb

## 2012-05-30 DIAGNOSIS — J449 Chronic obstructive pulmonary disease, unspecified: Secondary | ICD-10-CM

## 2012-05-30 DIAGNOSIS — Z23 Encounter for immunization: Secondary | ICD-10-CM

## 2012-05-30 MED ORDER — BUDESONIDE-FORMOTEROL FUMARATE 160-4.5 MCG/ACT IN AERO: 2.0000 | INHALATION_SPRAY | Freq: Two times a day (BID) | RESPIRATORY_TRACT | Status: DC

## 2012-05-30 NOTE — Patient Instructions (Addendum)
Very important to take Symbicort 160/4.30mcg 2 puffs Twice daily   Begin Allegra 180mg  At bedtime  As needed  Watery/itchy eyes and drainage.  Nasonex nose spray 1 puff Twice daily  Until sample is gone.  Cool compresses to eyes.  follow up Dr. Marchelle Gearing in 4 weeks and As needed   Please contact office for sooner follow up if symptoms do not improve or worsen or seek emergency care

## 2012-06-01 NOTE — Assessment & Plan Note (Signed)
Mild Flare with AR -also not compliant w/ inhalers   Plan; Very important to take Symbicort 160/4.59mcg 2 puffs Twice daily   Begin Allegra 180mg  At bedtime  As needed  Watery/itchy eyes and drainage.  Nasonex nose spray 1 puff Twice daily  Until sample is gone.  Cool compresses to eyes.  follow up Dr. Marchelle Gearing in 4 weeks and As needed   Please contact office for sooner follow up if symptoms do not improve or worsen or seek emergency care

## 2012-06-01 NOTE — Progress Notes (Signed)
78 yo female with known hx of COPD   PFT 12/05/2007: obstructive spirometry FEv1 1.05L/59% with 11% BD response and low dlco, restricted lung volume due to scoliosis    -> Lori [name] 10/21/2008: Fev1 0.92L/50%, FVC 1.6L/60%, Ratio 59 (78%) -> Dxed Gold state 2-3 COPD v asthma   05/30/12 Acute OV  Complains that over last several months breathing not doing as well with wheezing on/off.  Has not been taking Symbicort on reg basis  Has been having some drainage and watery eyes  Tickle in her throat.  No hemotpysis or edema.  Last CXR 7/13 w/ no acute issues. - was admitted with GI illness  ROS:  Constitutional:   No  weight loss, night sweats,  Fevers, chills,  +fatigue, or  lassitude.  HEENT:   No headaches,  Difficulty swallowing,  Tooth/dental problems, or  Sore throat,                No sneezing, itching, ear ache,  +nasal congestion, post nasal drip,   CV:  No chest pain,  Orthopnea, PND, swelling in lower extremities, anasarca, dizziness, palpitations, syncope.   GI  No heartburn, indigestion, abdominal pain, nausea, vomiting, diarrhea, change in bowel habits, loss of appetite, bloody stools.   Resp: No coughing up of blood.  No change in color of mucus.    No chest wall deformity  Skin: no rash or lesions.  GU: no dysuria, change in color of urine, no urgency or frequency.  No flank pain, no hematuria   MS:  No joint pain or swelling.  No decreased range of motion.  No back pain.  Psych:  No change in mood or affect. No depression or anxiety.  No memory loss.     EXAM:  GEN: A/Ox3; pleasant , NAD  HEENT:  [place]/AT,  EACs-clear, TMs-wnl, NOSE-clear drainage , THROAT-clear, no lesions, no postnasal drip or exudate noted.   NECK:  Supple w/ fair ROM; no JVD; normal carotid impulses w/o bruits; no thyromegaly or nodules palpated; no lymphadenopathy.  RESP  Coarse BS w/o, wheezes/ rales/ or rhonchi.no accessory muscle use, no dullness to percussion  CARD:  RRR, no m/r/g  , no  peripheral edema, pulses intact, no cyanosis or clubbing.  GI:   Soft & nt; nml bowel sounds; no organomegaly or masses detected.  Musco: Warm bil, no deformities or joint swelling noted.   Neuro: alert, no focal deficits noted.    Skin: Warm, no lesions or rashes

## 2012-06-07 ENCOUNTER — Ambulatory Visit (INDEPENDENT_AMBULATORY_CARE_PROVIDER_SITE_OTHER): Payer: Medicare Other | Admitting: General Surgery

## 2012-06-19 ENCOUNTER — Telehealth: Payer: Self-pay | Admitting: Internal Medicine

## 2012-06-19 NOTE — Telephone Encounter (Signed)
Appt on Oct 2

## 2012-06-19 NOTE — Telephone Encounter (Signed)
Pt wants to discuss the appt she has at [facility] on Monday.  Could she be worked in this week?

## 2012-06-19 NOTE — Telephone Encounter (Signed)
absolutely

## 2012-06-20 ENCOUNTER — Ambulatory Visit (INDEPENDENT_AMBULATORY_CARE_PROVIDER_SITE_OTHER): Payer: Medicare Other | Admitting: Internal Medicine

## 2012-06-20 ENCOUNTER — Encounter: Payer: Self-pay | Admitting: Internal Medicine

## 2012-06-20 ENCOUNTER — Other Ambulatory Visit (INDEPENDENT_AMBULATORY_CARE_PROVIDER_SITE_OTHER): Payer: Medicare Other

## 2012-06-20 VITALS — BP 124/82 | HR 78 | Temp 98.2°F | Resp 16 | Wt 139.0 lb

## 2012-06-20 DIAGNOSIS — R252 Cramp and spasm: Secondary | ICD-10-CM

## 2012-06-20 DIAGNOSIS — R197 Diarrhea, unspecified: Secondary | ICD-10-CM

## 2012-06-20 DIAGNOSIS — Z86010 Personal history of colonic polyps: Secondary | ICD-10-CM

## 2012-06-20 DIAGNOSIS — L659 Nonscarring hair loss, unspecified: Secondary | ICD-10-CM

## 2012-06-20 LAB — COMPREHENSIVE METABOLIC PANEL
ALT: 18 U/L (ref 0–35)
Albumin: 3.8 g/dL (ref 3.5–5.2)
CO2: 29 mEq/L (ref 19–32)
Calcium: 9.1 mg/dL (ref 8.4–10.5)
Chloride: 104 mEq/L (ref 96–112)
Creatinine, Ser: 0.7 mg/dL (ref 0.4–1.2)
GFR: 84.47 mL/min (ref >60.00)
Potassium: 4.7 mEq/L (ref 3.5–5.1)
Sodium: 139 mEq/L (ref 135–145)
Total Protein: 6.5 g/dL (ref 6.0–8.3)

## 2012-06-20 MED ORDER — ZOLPIDEM TARTRATE 10 MG PO TABS: 10.0000 mg | ORAL_TABLET | Freq: Every evening | ORAL | Status: DC | PRN

## 2012-06-20 MED ORDER — BIMATOPROST 0.01 % OP SOLN: 1.0000 [drp] | Freq: Every day | OPHTHALMIC | Status: DC

## 2012-06-20 MED ORDER — BUDESONIDE-FORMOTEROL FUMARATE 160-4.5 MCG/ACT IN AERO: 2.0000 | INHALATION_SPRAY | Freq: Two times a day (BID) | RESPIRATORY_TRACT | Status: DC

## 2012-06-20 MED ORDER — VALSARTAN-HYDROCHLOROTHIAZIDE 160-12.5 MG PO TABS: 1.0000 | ORAL_TABLET | Freq: Every day | ORAL | Status: DC

## 2012-06-20 MED ORDER — ALIGN PO CAPS: 1.0000 | ORAL_CAPSULE | Freq: Every day | ORAL | Status: DC

## 2012-06-20 NOTE — Patient Instructions (Addendum)
Questions: 1. What is the time frame for this adenomatous polyp to transform to a malignancy? 2. Is there an increased risk of perforation due to the location and size of this lesion?  Hair loss: Dr. Isaac Laud, department of dermatology [facility] - let me know when you want me to make an appointment  Leg cramps: 1. Tonic water 8-16 oz daily 2. ginko Biloba - see article.

## 2012-06-23 NOTE — Assessment & Plan Note (Addendum)
July '13 - serrated adenomatous polyp ascending colon - not removed. Now for referral to [facility] for possible polypectomy.  Plan   patient advised to inquire of consultant - risk of malignant transformation of this polyp and over what time line. With multiple medical comorbidities she needs to know the 5 year likelihood of transformation in order to make    Good decision about intervention.  Inquire as to any increased risk of perforation due to location and size of polyp.

## 2012-06-23 NOTE — Progress Notes (Signed)
Subjective:    Patient ID: Lori [name], female    DOB: 03/16/1925, 78 y.o.   MRN: [id number]  HPI Lori [name] was recently hospitalized during which stay she had colonoscopy which revealed a sessile polyp in the ascending colon. This could not be retrieved. Path reviewed from biopsy of the polyp - serrated adenoma. Dr. Rhea [name] had recommended consultation with physician at [facility] for endoscopic polypectomy. She presents to discuss this situation. She reports that she is doing well: she has resumed her normal physical activity including swimming 30 laps 3 times a week.  She is concerned about hair loss and is interested in referral to teaching center.  Leg cramps - continued problem  Past Medical History  Diagnosis Date  . COPD (chronic obstructive pulmonary disease)   . Asthma   . ALLERGIC RHINITIS   . Hypertension   . Pleural effusion, left   . Colles' fracture of left radius   . Anxiety   . Back pain of thoracolumbar region     right  . Pruritus   . Diastolic dysfunction   . Pulmonary nodule   . IBS (irritable bowel syndrome)   . Bronchiectasis   . Insomnia   . History of pneumonia   . Diverticulosis   . Hx of colonic polyp   . Multiple rib fractures 10/2008    bilateral   Past Surgical History  Procedure Date  . Tonsillectomy   . Appendectomy   . Cholecystectomy   . Polypectomy   . Bladder suspension 2005    A-P  . Rotator cuff repair 2006    right  . Orif wrist fracture 2010    left wrist.  Dr. Mina [name].  . Total knee arthroplasty 08/2010    right  . Tonsillectomy   . Colonoscopy 04/06/2012    Procedure: COLONOSCOPY;  Surgeon: Lori Fiedler, MD;  Location: [facility];  Service: Gastroenterology;  Laterality: N/A;   Family History  Problem Relation Age of Onset  . Heart disease Mother   . Colon cancer Neg Hx   . Bipolar disorder Daughter   . Rheumatic fever Mother   . Pneumonia Father    History   Social History  . Marital Status: Married    Spouse Name:  N/A    Number of Children: 3  . Years of Education: N/A   Occupational History  . retired    Social History Main Topics  . Smoking status: Former Smoker -- 0.3 packs/day for 10 years    Types: Cigarettes    Quit date: 09/19/1968  . Smokeless tobacco: Never Used  . Alcohol Use: Yes     rarely  . Drug Use: No  . Sexually Active: No   Other Topics Concern  . Not on file   Social History Narrative   College - [employer]; [place] - [place]. Married 1959 - spouse s/p valve surgery with resulting paralysis hemidiaphragm ('10). 3 daughter, 5 grandsons, 4 granddaughters.  1 daughter bipolar - social issues. She remains very active, but can't play tennis. Pt was apprently ex-smoker, but husband does not recollect pt ever smoking. Stressful move [place] smaller [place] (fall '09) and now moving to [place] (fall '12).    Current Outpatient Prescriptions on File Prior to Visit  Medication Sig Dispense Refill  . acetaminophen (TYLENOL) 500 MG tablet Take 500 mg by mouth 2 (two) times daily as needed.       Marland Kitchen albuterol (PROVENTIL HFA;VENTOLIN HFA) 108 (90 BASE) MCG/ACT inhaler  Inhale 2 puffs into the lungs every 6 (six) hours as needed for wheezing.  1 Inhaler  2  . aspirin EC 81 MG tablet Take 1 tablet (81 mg total) by mouth daily.      . bimatoprost (LUMIGAN) 0.01 % SOLN Place 1 drop into both eyes daily.  5 mL  3  . budesonide-formoterol (SYMBICORT) 160-4.5 MCG/ACT inhaler Inhale 2 puffs into the lungs 2 (two) times daily.  1 Inhaler  12  . colesevelam (WELCHOL) 625 MG tablet Take 625 mg by mouth daily.      Marland Kitchen dicyclomine (BENTYL) 10 MG capsule Take 1 tablet by mouth three times a day as needed for urgency.  90 capsule  3  . esomeprazole (NEXIUM) 40 MG capsule Take 40 mg by mouth daily.      Marland Kitchen ibuprofen (ADVIL,MOTRIN) 100 MG tablet Take 100 mg by mouth every 6 (six) hours as needed.        . Loperamide HCl (IMODIUM A-D) 1 MG/7.5ML LIQD Take by mouth as directed  150 mL  0  . Multiple Vitamin  (MULTIVITAMIN) tablet Take by mouth daily.        . nitroGLYCERIN (NITROSTAT) 0.4 MG SL tablet Place 1 tablet (0.4 mg total) under the tongue every 5 (five) minutes as needed for chest pain.  100 tablet  3  . valsartan-hydrochlorothiazide (DIOVAN-HCT) 160-12.5 MG per tablet Take 1 tablet by mouth daily.  90 tablet  3  . zolpidem (AMBIEN) 10 MG tablet TAKE ONE TABLET AT BEDTIME AS NEEDED  30 tablet  5      Review of Systems System review is negative for any constitutional, cardiac, pulmonary, GI or neuro symptoms or complaints other than as described in the HPI.     Objective:   Physical Exam Filed Vitals:   06/20/12 1317  BP: 124/82  Pulse: 78  Temp: 98.2 F (36.8 C)  Resp: 16   Wt Readings from Last 3 Encounters:  06/20/12 139 lb (63.05 kg)  05/30/12 138 lb 9.6 oz (62.869 kg)  05/15/12 137 lb 6.4 oz (62.324 kg)   Gen'l- WNWD nicely groomed white woman in no distress HEENT- C&S clear, PERRLA Cor- RRR PUlm - normal respirations Neuro - A&O x 3       Assessment & Plan:  Hair loss: Dr. Isaac [name], department of dermatology [facility] - let me know when you want me to make an appointment  Leg cramps: 1. Tonic water 8-16 oz daily 2. ginko Biloba - see article.

## 2012-06-24 ENCOUNTER — Encounter: Payer: Self-pay | Admitting: Internal Medicine

## 2012-06-25 DIAGNOSIS — K635 Polyp of colon: Secondary | ICD-10-CM | POA: Insufficient documentation

## 2012-06-26 ENCOUNTER — Encounter: Payer: Self-pay | Admitting: Cardiology

## 2012-07-30 ENCOUNTER — Telehealth: Payer: Self-pay | Admitting: Internal Medicine

## 2012-07-30 DIAGNOSIS — K589 Irritable bowel syndrome without diarrhea: Secondary | ICD-10-CM

## 2012-07-30 NOTE — Telephone Encounter (Signed)
Referral placed with [facility] 

## 2012-07-30 NOTE — Telephone Encounter (Signed)
Pt wants to be referred to GI for IBS.

## 2012-07-31 ENCOUNTER — Encounter: Payer: Self-pay | Admitting: Internal Medicine

## 2012-08-01 ENCOUNTER — Encounter: Payer: Self-pay | Admitting: Internal Medicine

## 2012-08-01 ENCOUNTER — Ambulatory Visit (INDEPENDENT_AMBULATORY_CARE_PROVIDER_SITE_OTHER): Payer: Medicare Other | Admitting: Internal Medicine

## 2012-08-01 ENCOUNTER — Ambulatory Visit (INDEPENDENT_AMBULATORY_CARE_PROVIDER_SITE_OTHER): Payer: Medicare Other | Admitting: Critical Care Medicine

## 2012-08-01 ENCOUNTER — Encounter: Payer: Self-pay | Admitting: Critical Care Medicine

## 2012-08-01 VITALS — BP 150/94 | HR 109 | Temp 97.4°F | Ht 65.5 in | Wt 142.2 lb

## 2012-08-01 VITALS — BP 150/88 | HR 77 | Ht 65.5 in | Wt 142.6 lb

## 2012-08-01 DIAGNOSIS — J069 Acute upper respiratory infection, unspecified: Secondary | ICD-10-CM

## 2012-08-01 DIAGNOSIS — R195 Other fecal abnormalities: Secondary | ICD-10-CM

## 2012-08-01 DIAGNOSIS — K589 Irritable bowel syndrome without diarrhea: Secondary | ICD-10-CM

## 2012-08-01 DIAGNOSIS — J449 Chronic obstructive pulmonary disease, unspecified: Secondary | ICD-10-CM

## 2012-08-01 DIAGNOSIS — D126 Benign neoplasm of colon, unspecified: Secondary | ICD-10-CM

## 2012-08-01 DIAGNOSIS — J0100 Acute maxillary sinusitis, unspecified: Secondary | ICD-10-CM

## 2012-08-01 MED ORDER — LEVOFLOXACIN 500 MG PO TABS
500.0000 mg | ORAL_TABLET | Freq: Every day | ORAL | Status: DC
Start: 2012-08-01 — End: 2012-08-31

## 2012-08-01 MED ORDER — FLUTICASONE PROPIONATE 50 MCG/ACT NA SUSP: 2.0000 | Freq: Every day | NASAL | Status: DC

## 2012-08-01 MED ORDER — METHYLPREDNISOLONE ACETATE 80 MG/ML IJ SUSP
120.0000 mg | Freq: Once | INTRAMUSCULAR | Status: AC
  Administered 2012-08-01: 120 mg via INTRAMUSCULAR

## 2012-08-01 NOTE — Progress Notes (Signed)
Patient ID: Lori [name], female   DOB: 03/16/1925, 78 y.o.   MRN: [id number]  SUBJECTIVE: HPI Mrs. Lori [name] is an 78 year old female with PMH of mild COPD, hypertension, IBS, and history of adenomatous colon polyps who seen in followup. She reports that she has "terribly sick" today and needs help. She reports her symptoms started Friday initially with terrible diarrhea and bloating, but this is no longer her biggest complaint. She reports she has now developed URI symptoms including rhinorrhea, postnasal drip, coughing, and head congestion/pressure. No fevers, dyspnea or chest pain. She reports her diarrhea is somewhat better but she continues to feel bloated and distended. She does continue taking align and Nexium. She is rarely using dicyclomine or loperamide. She did see Dr. Shana Chute at [facility] and plans for ascending colon polypectomy on 09/25/2012.  No melena or rectal bleeding. No abdominal pain. No nausea or vomiting.    Review of Systems  As per history of present illness, otherwise negative   Past Medical History  Diagnosis Date  . COPD (chronic obstructive pulmonary disease)   . Asthma   . ALLERGIC RHINITIS   . Hypertension   . Pleural effusion, left   . Colles' fracture of left radius   . Anxiety   . Back pain of thoracolumbar region     right  . Pruritus   . Diastolic dysfunction   . Pulmonary nodule   . IBS (irritable bowel syndrome)   . Bronchiectasis   . Insomnia   . History of pneumonia   . Diverticulosis   . Hx of colonic polyp   . Multiple rib fractures 10/2008    bilateral    Current Outpatient Prescriptions  Medication Sig Dispense Refill  . acetaminophen (TYLENOL) 500 MG tablet Take 500 mg by mouth 2 (two) times daily as needed.       Marland Kitchen albuterol (PROVENTIL HFA;VENTOLIN HFA) 108 (90 BASE) MCG/ACT inhaler Inhale 2 puffs into the lungs every 6 (six) hours as needed for wheezing.  1 Inhaler  2  . aspirin EC 81 MG tablet Take 1 tablet (81 mg total) by mouth daily.        . bifidobacterium infantis (ALIGN) capsule Take 1 capsule by mouth daily.  90 capsule  3  . bimatoprost (LUMIGAN) 0.01 % SOLN Place 1 drop into both eyes daily.  5 mL  3  . budesonide-formoterol (SYMBICORT) 160-4.5 MCG/ACT inhaler Inhale 2 puffs into the lungs 2 (two) times daily.  1 Inhaler  12  . colesevelam (WELCHOL) 625 MG tablet ON HOLD      . dicyclomine (BENTYL) 10 MG capsule Take 1 tablet by mouth three times a day as needed for urgency.  90 capsule  3  . esomeprazole (NEXIUM) 40 MG capsule Take 40 mg by mouth daily.      Marland Kitchen ibuprofen (ADVIL,MOTRIN) 100 MG tablet Take 100 mg by mouth every 6 (six) hours as needed.        . Loperamide HCl (IMODIUM A-D) 1 MG/7.5ML LIQD Take by mouth as directed  150 mL  0  . Multiple Vitamin (MULTIVITAMIN) tablet Take by mouth daily.        . nitroGLYCERIN (NITROSTAT) 0.4 MG SL tablet Place 1 tablet (0.4 mg total) under the tongue every 5 (five) minutes as needed for chest pain.  100 tablet  3  . valsartan-hydrochlorothiazide (DIOVAN-HCT) 160-12.5 MG per tablet Take 1 tablet by mouth daily.  90 tablet  3  . zolpidem (AMBIEN) 10 MG tablet Take 1 tablet (  10 mg total) by mouth at bedtime as needed.  30 tablet  5  . [DISCONTINUED] zolpidem (AMBIEN) 10 MG tablet TAKE ONE TABLET AT BEDTIME AS NEEDED  30 tablet  5  . Artificial Tears 0.1-0.3 % SOLN Apply to eye as needed.      . fluticasone (FLONASE) 50 MCG/ACT nasal spray Place 2 sprays into the nose daily.  16 g  2  . guaiFENesin (ROBITUSSIN) 100 MG/5ML liquid Take by mouth 2 (two) times daily.      Marland Kitchen levofloxacin (LEVAQUIN) 500 MG tablet Take 1 tablet (500 mg total) by mouth daily.  10 tablet  0  . sodium chloride (OCEAN) 0.65 % nasal spray Place 1 spray into the nose as needed.        Allergies  Allergen Reactions  . Amoxicillin     REACTION: unspecified  . Penicillins     REACTION: causes hives  . Sulfonamide Derivatives Nausea Only    Family History  Problem Relation Age of Onset  . Heart disease  Mother   . Colon cancer Neg Hx   . Bipolar disorder Daughter   . Rheumatic fever Mother   . Pneumonia Father     History  Substance Use Topics  . Smoking status: Former Smoker -- 0.3 packs/day for 10 years    Types: Cigarettes    Quit date: 09/19/1948  . Smokeless tobacco: Never Used  . Alcohol Use: Yes     Comment: rarely    OBJECTIVE: BP 150/88  Pulse 77  Ht 5' 5.5" (1.664 m)  Wt 142 lb 9.6 oz (64.683 kg)  BMI 23.37 kg/m2 Constitutional: Well-developed and well-nourished. No distress. HEENT: Normocephalic and atraumatic. Oropharynx is clear and moist. No oropharyngeal exudate. Conjunctivae are normal. No scleral icterus. TMs clear bilaterally Neck: Neck supple. Trachea midline. Cardiovascular: Normal rate, regular rhythm and intact distal pulses. No M/R/G Pulmonary/chest: Effort normal and breath sounds normal. No wheezing, rales or rhonchi. Abdominal: Soft, nontender, nondistended. Bowel sounds active throughout. There are no masses palpable.  Extremities: no clubbing, cyanosis, or edema Lymphadenopathy: No cervical adenopathy noted. Neurological: Alert and oriented to person place and time. Skin: Skin is warm and dry. No rashes noted. Psychiatric: Normal mood and affect. Behavior is normal.  ASSESSMENT AND PLAN: 78 year old female with PMH of mild COPD, hypertension, IBS, and history of adenomatous colon polyps who seen in followup  1.  URI/sinusitis -- her symptoms likely started as viral in nature, that may have progressed to sinusitis. We have contacted the pulmonary clinic and she will see Dr. Delford Field momentarily. I recommended that she continue with over-the-counter Mucinex and consider a decongestant such as Sudafed. I will defer this further Dr. Delford Field and appreciate his seeing her today.  2.  IBS -- her abdominal symptoms are not new and fluctuate and are likely related to her irritable bowel syndrome. I've advised that she continue to use her probiotic, especially  if she is started on antibiotics for sinusitis. Also she should continue to use loperamide as needed for loose stools/diarrhea. She is not using loperamide frequently and likely will benefit from it she has in the past. She can use Bentyl for abdominal cramping/pain if necessary. If her diarrhea becomes uncontrollable or changes in any way concerning to her she is asked to call us back and she voices understanding.  3.  Right-sided sessile serrated adenoma -- colonoscopy for polypectomy planned at [facility] in January

## 2012-08-01 NOTE — Progress Notes (Signed)
Subjective:    Patient ID: Lori [name], female    DOB: 03/16/1925, 78 y.o.   MRN: [id number]  HPI 78 yo female with known hx of COPD   PFT 12/05/2007: obstructive spirometry FEv1 1.05L/59% with 11% BD response and low dlco, restricted lung volume due to scoliosis    -> [facility] 10/21/2008: Fev1 0.92L/50%, FVC 1.6L/60%, Ratio 59 (78%) -> Dxed Gold state 2-3 COPD v asthma   05/30/12 Acute OV  Complains that over last several months breathing not doing as well with wheezing on/off.  Has not been taking Symbicort on reg basis  Has been having some drainage and watery eyes  Tickle in her throat.  No hemotpysis or edema.  Last CXR 7/13 w/ no acute issues. - was admitted with GI illness  08/01/2012 COPD Pt of MW  Acute OV   Pt ill x 4 days.  Pt c/o nausea, now dyspnea and nasal congestion .  ?sinus infection. Nasal d/c is clear.  If coughs is yellow.  Notes some wheeze.  No recent sinus infections.  Chronic abd pain and diarrhea and bloating and gas.       Review of Systems Constitutional:   No  weight loss, night sweats,  Fevers, chills, fatigue, lassitude. HEENT:   Notes  headaches,  Difficulty swallowing,  Tooth/dental problems,  +++Sore throat,                No sneezing, itching, ear ache,++++ nasal congestion+++post nasal drip,   CV:  No chest pain,  Orthopnea, PND, swelling in lower extremities, anasarca, dizziness, palpitations  GI  No heartburn, indigestion, abdominal pain, nausea, vomiting, diarrhea, change in bowel habits, loss of appetite  Resp: Notes  shortness of breath with exertion not  at rest.  No excess mucus, no productive cough,  No non-productive cough,  No coughing up of blood.  No change in color of mucus.  No wheezing.  No chest wall deformity  Skin: no rash or lesions.  GU: no dysuria, change in color of urine, no urgency or frequency.  No flank pain.  MS:  No joint pain or swelling.  No decreased range of motion.  No back pain.  Psych:  No change in mood or  affect. No depression or anxiety.  No memory loss.     Objective:   Physical Exam Filed Vitals:   08/01/12 1127  BP: 150/94  Pulse: 109  Temp: 97.4 F (36.3 C)  TempSrc: Oral  Height: 5' 5.5" (1.664 m)  Weight: 142 lb 3.2 oz (64.501 kg)  SpO2: 97%    Gen: Pleasant, well-nourished, in no distress,  normal affect  ENT: No lesions,  mouth clear,  oropharynx clear, +++postnasal drip, bilateral nasal purulence was severe turbinate edema  Neck: No JVD, no TMG, no carotid bruits  Lungs: No use of accessory muscles, no dullness to percussion, distant breath sounds  Cardiovascular: RRR, heart sounds normal, no murmur or gallops, no peripheral edema  Abdomen: soft and NT, no HSM,  BS normal  Musculoskeletal: No deformities, no cyanosis or clubbing  Neuro: alert, non focal  Skin: Warm, no lesions or rashes  No results found.        Assessment & Plan:   Sinusitis, acute, maxillary Acute maxillary sinusitis with chronic obstructive lung disease flare Plan Levaquin 500 mg daily for 7 days Depo-Medrol injection 120 mg IM was administered Nasal steroid to continue Sinus rinse with saline to continue  COPD Chronic obstructive lung disease with emphysematous component and  asthmatic component Note Symbicort inhaler technique was very poor and we reeducated the patient to about a 50-60% level on inhaler technique The patient was reinforced to continue to work on inhaler technique   Updated Medication List Outpatient Encounter Prescriptions as of 08/01/2012  Medication Sig Dispense Refill  . acetaminophen (TYLENOL) 500 MG tablet Take 500 mg by mouth 2 (two) times daily as needed.       Marland Kitchen albuterol (PROVENTIL HFA;VENTOLIN HFA) 108 (90 BASE) MCG/ACT inhaler Inhale 2 puffs into the lungs every 6 (six) hours as needed for wheezing.  1 Inhaler  2  . Artificial Tears 0.1-0.3 % SOLN Apply to eye as needed.      Marland Kitchen aspirin EC 81 MG tablet Take 1 tablet (81 mg total) by mouth daily.       . bifidobacterium infantis (ALIGN) capsule Take 1 capsule by mouth daily.  90 capsule  3  . bimatoprost (LUMIGAN) 0.01 % SOLN Place 1 drop into both eyes daily.  5 mL  3  . budesonide-formoterol (SYMBICORT) 160-4.5 MCG/ACT inhaler Inhale 2 puffs into the lungs 2 (two) times daily.  1 Inhaler  12  . dicyclomine (BENTYL) 10 MG capsule Take 1 tablet by mouth three times a day as needed for urgency.  90 capsule  3  . guaiFENesin (ROBITUSSIN) 100 MG/5ML liquid Take by mouth 2 (two) times daily.      Marland Kitchen ibuprofen (ADVIL,MOTRIN) 100 MG tablet Take 100 mg by mouth every 6 (six) hours as needed.        . Loperamide HCl (IMODIUM A-D) 1 MG/7.5ML LIQD Take by mouth as directed  150 mL  0  . Multiple Vitamin (MULTIVITAMIN) tablet Take by mouth daily.        . nitroGLYCERIN (NITROSTAT) 0.4 MG SL tablet Place 1 tablet (0.4 mg total) under the tongue every 5 (five) minutes as needed for chest pain.  100 tablet  3  . sodium chloride (OCEAN) 0.65 % nasal spray Place 1 spray into the nose as needed.      . valsartan-hydrochlorothiazide (DIOVAN-HCT) 160-12.5 MG per tablet Take 1 tablet by mouth daily.  90 tablet  3  . zolpidem (AMBIEN) 10 MG tablet Take 1 tablet (10 mg total) by mouth at bedtime as needed.  30 tablet  5  . [DISCONTINUED] zolpidem (AMBIEN) 10 MG tablet TAKE ONE TABLET AT BEDTIME AS NEEDED  30 tablet  5  . colesevelam (WELCHOL) 625 MG tablet ON HOLD      . esomeprazole (NEXIUM) 40 MG capsule Take 40 mg by mouth daily.      . fluticasone (FLONASE) 50 MCG/ACT nasal spray Place 2 sprays into the nose daily.  16 g  2  . levofloxacin (LEVAQUIN) 500 MG tablet Take 1 tablet (500 mg total) by mouth daily.  10 tablet  0   Facility-Administered Encounter Medications as of 08/01/2012  Medication Dose Route Frequency Provider Last Rate Last Dose  . [COMPLETED] methylPREDNISolone acetate (DEPO-MEDROL) injection 120 mg  120 mg Intramuscular Once Storm Frisk, MD   120 mg at 08/01/12 1244

## 2012-08-01 NOTE — Patient Instructions (Signed)
Levaquin one daily x 10days Depo medrol 120mg  IM was given Start Flonase two puff each nostril daily Stay on symbicort, work on inhaler technique Saline nasal spray three sprays each nostril three times a day for 7days Keep next OV with Dr Marchelle Gearing Return if unimproving

## 2012-08-01 NOTE — Assessment & Plan Note (Signed)
Chronic obstructive lung disease with emphysematous component and asthmatic component Note Symbicort inhaler technique was very poor and we reeducated the patient to about a 50-60% level on inhaler technique The patient was reinforced to continue to work on inhaler technique

## 2012-08-01 NOTE — Patient Instructions (Addendum)
Continue taking Align daily.   Use Imodium for loose stools.  Talk to Dr. Delford Field about your URI symptoms.   Dr. Terri Piedra recommends Sudafed as a decongestant take every 12 hours.

## 2012-08-01 NOTE — Assessment & Plan Note (Signed)
Acute maxillary sinusitis with chronic obstructive lung disease flare Plan Levaquin 500 mg daily for 7 days Depo-Medrol injection 120 mg IM was administered Nasal steroid to continue Sinus rinse with saline to continue

## 2012-08-03 ENCOUNTER — Emergency Department (HOSPITAL_COMMUNITY): Payer: Medicare Other

## 2012-08-03 ENCOUNTER — Emergency Department (HOSPITAL_COMMUNITY)
Admission: EM | Admit: 2012-08-03 | Discharge: 2012-08-03 | Disposition: A | Discharge status: Home / Self Care | Payer: Medicare Other | Attending: Emergency Medicine | Admitting: Emergency Medicine

## 2012-08-03 ENCOUNTER — Encounter (HOSPITAL_COMMUNITY): Payer: Self-pay | Admitting: Emergency Medicine

## 2012-08-03 DIAGNOSIS — I10 Essential (primary) hypertension: Secondary | ICD-10-CM | POA: Insufficient documentation

## 2012-08-03 DIAGNOSIS — Z8719 Personal history of other diseases of the digestive system: Secondary | ICD-10-CM | POA: Insufficient documentation

## 2012-08-03 DIAGNOSIS — I519 Heart disease, unspecified: Secondary | ICD-10-CM | POA: Insufficient documentation

## 2012-08-03 DIAGNOSIS — Z8701 Personal history of pneumonia (recurrent): Secondary | ICD-10-CM | POA: Insufficient documentation

## 2012-08-03 DIAGNOSIS — Z860100 Personal history of colon polyps, unspecified: Secondary | ICD-10-CM | POA: Insufficient documentation

## 2012-08-03 DIAGNOSIS — Z8709 Personal history of other diseases of the respiratory system: Secondary | ICD-10-CM | POA: Insufficient documentation

## 2012-08-03 DIAGNOSIS — J301 Allergic rhinitis due to pollen: Secondary | ICD-10-CM | POA: Insufficient documentation

## 2012-08-03 DIAGNOSIS — Z87828 Personal history of other (healed) physical injury and trauma: Secondary | ICD-10-CM | POA: Insufficient documentation

## 2012-08-03 DIAGNOSIS — R110 Nausea: Secondary | ICD-10-CM | POA: Insufficient documentation

## 2012-08-03 DIAGNOSIS — Z87891 Personal history of nicotine dependence: Secondary | ICD-10-CM | POA: Insufficient documentation

## 2012-08-03 DIAGNOSIS — Z79899 Other long term (current) drug therapy: Secondary | ICD-10-CM | POA: Insufficient documentation

## 2012-08-03 DIAGNOSIS — J449 Chronic obstructive pulmonary disease, unspecified: Secondary | ICD-10-CM | POA: Insufficient documentation

## 2012-08-03 DIAGNOSIS — Z7982 Long term (current) use of aspirin: Secondary | ICD-10-CM | POA: Insufficient documentation

## 2012-08-03 DIAGNOSIS — R55 Syncope and collapse: Secondary | ICD-10-CM | POA: Insufficient documentation

## 2012-08-03 DIAGNOSIS — R197 Diarrhea, unspecified: Secondary | ICD-10-CM | POA: Insufficient documentation

## 2012-08-03 DIAGNOSIS — K589 Irritable bowel syndrome without diarrhea: Secondary | ICD-10-CM | POA: Insufficient documentation

## 2012-08-03 DIAGNOSIS — J4489 Other specified chronic obstructive pulmonary disease: Secondary | ICD-10-CM | POA: Insufficient documentation

## 2012-08-03 LAB — CBC WITH DIFFERENTIAL/PLATELET
Basophils Relative: 0 % (ref 0–1)
Eosinophils Absolute: 0.1 10*3/uL (ref 0.0–0.7)
HCT: 39.4 % (ref 36.0–46.0)
Hemoglobin: 13.1 g/dL (ref 12.0–15.0)
MCH: 28.4 pg (ref 26.0–34.0)
MCHC: 33.2 g/dL (ref 30.0–36.0)
Monocytes Absolute: 0.4 10*3/uL (ref 0.1–1.0)
Monocytes Relative: 5 % (ref 3–12)
Neutrophils Relative %: 80 % — ABNORMAL HIGH (ref 43–77)

## 2012-08-03 LAB — COMPREHENSIVE METABOLIC PANEL
ALT: 17 U/L (ref 0–35)
AST: 18 U/L (ref 0–37)
CO2: 25 mEq/L (ref 19–32)
Calcium: 8.2 mg/dL — ABNORMAL LOW (ref 8.4–10.5)
GFR calc non Af Amer: 79 mL/min — ABNORMAL LOW (ref >90)
Potassium: 3.8 mEq/L (ref 3.5–5.1)
Sodium: 137 mEq/L (ref 135–145)

## 2012-08-03 LAB — URINE MICROSCOPIC-ADD ON

## 2012-08-03 LAB — URINALYSIS, ROUTINE W REFLEX MICROSCOPIC
Glucose, UA: NEGATIVE mg/dL
Specific Gravity, Urine: 1.015 (ref 1.005–1.030)

## 2012-08-03 MED ORDER — SODIUM CHLORIDE 0.9 % IV BOLUS (SEPSIS)
1000.0000 mL | Freq: Once | INTRAVENOUS | Status: AC
  Administered 2012-08-03: 1000 mL via INTRAVENOUS

## 2012-08-03 NOTE — ED Notes (Signed)
Per EMS patient was as [employer] today and had a syncopal episode while checking out.  Patient was incontinent of stool and had nausea.  Patient was seen 3 days ago at [facility] for viral infection and was started on a new ABX.  Patient anxious upon arrival.

## 2012-08-03 NOTE — ED Notes (Signed)
Patients CBG was 97. This was reported to the RN

## 2012-08-03 NOTE — ED Provider Notes (Signed)
History     CSN: [id number]  Arrival date & time 08/03/12  1333   First MD Initiated Contact with Patient 08/03/12 1459      Chief Complaint  Patient presents with  . Loss of Consciousness     HPI  The patient presents after a syncopal episode.  History of present illness is per the patient's husband, she has very poor hearing capacity without her hearing aids.  He notes that over the past week the patient has been ill, with diffuse generalized discomfort, increasing fatigue, persistent diarrhea.  During this time the patient has been seen her primary care physician, including yesterday.  She was recently started on Levaquin. They deny any ongoing fever, chills, chest pain, dyspnea beyond her baseline.  She has a COPD. Today, just prior to presentation the patient was shopping, had a witnessed syncopal event.  She recalls awakening him with no residual chest pain, dyspnea, headache.  She reports that she had persistency of the aforementioned symptoms.  Past Medical History  Diagnosis Date  . COPD (chronic obstructive pulmonary disease)   . Asthma   . ALLERGIC RHINITIS   . Hypertension   . Pleural effusion, left   . Colles' fracture of left radius   . Anxiety   . Back pain of thoracolumbar region     right  . Pruritus   . Diastolic dysfunction   . Pulmonary nodule   . IBS (irritable bowel syndrome)   . Bronchiectasis   . Insomnia   . History of pneumonia   . Diverticulosis   . Hx of colonic polyp   . Multiple rib fractures 10/2008    bilateral    Past Surgical History  Procedure Date  . Tonsillectomy   . Appendectomy   . Cholecystectomy   . Polypectomy   . Bladder suspension 2005    A-P  . Rotator cuff repair 2006    right  . Orif wrist fracture 2010    left wrist.  Dr. Mina Marble.  . Total knee arthroplasty 08/2010    right  . Tonsillectomy   . Colonoscopy 04/06/2012    Procedure: COLONOSCOPY;  Surgeon: Beverley Fiedler, MD;  Location: [facility];  Service:  Gastroenterology;  Laterality: N/A;    Family History  Problem Relation Age of Onset  . Heart disease Mother   . Colon cancer Neg Hx   . Bipolar disorder Daughter   . Rheumatic fever Mother   . Pneumonia Father     History  Substance Use Topics  . Smoking status: Former Smoker -- 0.3 packs/day for 10 years    Types: Cigarettes    Quit date: 09/19/1948  . Smokeless tobacco: Never Used  . Alcohol Use: Yes     Comment: rarely    OB History    Grav Para Term Preterm Abortions TAB SAB Ect Mult Living                  Review of Systems  Constitutional:       Per HPI, otherwise negative  HENT:       Per HPI, otherwise negative  Eyes: Negative.   Respiratory:       Per HPI, otherwise negative  Cardiovascular:       Per HPI, otherwise negative  Gastrointestinal: Positive for nausea and diarrhea. Negative for vomiting.  Genitourinary: Negative.   Musculoskeletal:       Per HPI, otherwise negative  Skin: Negative.   Neurological: Positive for syncope, weakness and light-headedness.  Negative for dizziness, facial asymmetry and headaches.    Allergies  Amoxicillin; Penicillins; and Sulfonamide derivatives  Home Medications   Current Outpatient Rx  Name  Route  Sig  Dispense  Refill  . ACETAMINOPHEN 500 MG PO TABS   Oral   Take 500 mg by mouth 2 (two) times daily as needed.          . ALBUTEROL SULFATE HFA 108 (90 BASE) MCG/ACT IN AERS   Inhalation   Inhale 2 puffs into the lungs every 6 (six) hours as needed for wheezing.   1 Inhaler   2   . ARTIFICIAL TEARS 0.1-0.3 % OP SOLN   Ophthalmic   Apply to eye as needed.         . ASPIRIN EC 81 MG PO TBEC   Oral   Take 1 tablet (81 mg total) by mouth daily.         Marland Kitchen DICYCLOMINE HCL 10 MG PO CAPS      Take 1 tablet by mouth three times a day as needed for urgency.   90 capsule   3     Patient states has lost her medication bottle and  ...   . FLUTICASONE PROPIONATE 50 MCG/ACT NA SUSP   Nasal   Place  2 sprays into the nose daily.   16 g   2   . LEVOFLOXACIN 500 MG PO TABS   Oral   Take 1 tablet (500 mg total) by mouth daily.   10 tablet   0   . ONE-DAILY MULTI VITAMINS PO TABS   Oral   Take by mouth daily.           Marland Kitchen SALINE NASAL SPRAY 0.65 % NA SOLN   Nasal   Place 1 spray into the nose as needed.         Marland Kitchen VALACYCLOVIR HCL 1 G PO TABS   Oral   Take 1,000 mg by mouth daily.         Marland Kitchen VALSARTAN-HYDROCHLOROTHIAZIDE 160-12.5 MG PO TABS   Oral   Take 1 tablet by mouth daily.   90 tablet   3   . ZOLPIDEM TARTRATE 10 MG PO TABS   Oral   Take 1 tablet (10 mg total) by mouth at bedtime as needed.   30 tablet   5   . ALIGN PO CAPS   Oral   Take 1 capsule by mouth daily.   90 capsule   3     Lot # [id number]  Exp  06/2012   . BIMATOPROST 0.01 % OP SOLN   Both Eyes   Place 1 drop into both eyes daily.   5 mL   3   . BUDESONIDE-FORMOTEROL FUMARATE 160-4.5 MCG/ACT IN AERO   Inhalation   Inhale 2 puffs into the lungs 2 (two) times daily.   1 Inhaler   12   . COLESEVELAM HCL 625 MG PO TABS      ON HOLD         . ESOMEPRAZOLE MAGNESIUM 40 MG PO CPDR   Oral   Take 40 mg by mouth daily.         . GUAIFENESIN 100 MG/5ML PO LIQD   Oral   Take by mouth 2 (two) times daily.         . IBUPROFEN 100 MG PO TABS   Oral   Take 100 mg by mouth every 6 (six) hours as needed.           Marland Kitchen  LOPERAMIDE HCL 1 MG/7.5ML PO LIQD      Take by mouth as directed   150 mL   0   . NITROGLYCERIN 0.4 MG SL SUBL   Sublingual   Place 1 tablet (0.4 mg total) under the tongue every 5 (five) minutes as needed for chest pain.   100 tablet   3     BP 151/68  Pulse 61  Temp 96.8 F (36 C) (Rectal)  Resp 20  SpO2 97%  Physical Exam  Nursing note and vitals reviewed. Constitutional: She is oriented to person, place, and time. She appears well-developed and well-nourished. She appears listless. She appears ill. No distress.  HENT:  Head: Normocephalic and  atraumatic.  Eyes: Conjunctivae normal and EOM are normal.  Neck: Full passive range of motion without pain. Neck supple. No spinous process tenderness and no muscular tenderness present. No rigidity. No edema, no erythema and normal range of motion present.  Cardiovascular: Normal rate and regular rhythm.   Murmur heard. Pulmonary/Chest: Effort normal and breath sounds normal. No stridor. No respiratory distress.  Abdominal: She exhibits no distension.  Musculoskeletal: She exhibits no edema.  Neurological: She is oriented to person, place, and time. She appears listless. No cranial nerve deficit.  Skin: Skin is warm and dry.  Psychiatric: She has a normal mood and affect.    ED Course  Procedures (including critical care time)   Labs Reviewed  GLUCOSE, CAPILLARY  COMPREHENSIVE METABOLIC PANEL  CBC WITH DIFFERENTIAL  TROPONIN I  LACTIC ACID, PLASMA   Ct Head Wo Contrast - If Head Trauma Is Known/suspected And/or Pt Is Taking Anticoagulant  08/03/2012  *RADIOLOGY REPORT*  Clinical Data: Loss of consciousness.  CT HEAD WITHOUT CONTRAST  Technique:  Contiguous axial images were obtained from the base of the skull through the vertex without contrast.  Comparison: MRI of the brain 10/31/2011.  Findings: Moderate cerebral and mild cerebellar atrophy with extensive patchy and confluent areas of decreased attenuation throughout the deep and periventricular white matter of the cerebral hemispheres bilaterally, compatible with chronic microvascular ischemic disease.  Physiologic calcifications of the basal ganglia bilaterally.  No acute intracranial abnormalities. Specifically, no signs of acute/subacute cerebral ischemia, no evidence of acute intracerebral hemorrhage, no focal mass, mass effect, hydrocephalus or abnormal intra or extra-axial fluid collections.  No acute displaced skull fractures are noted. Visualized paranasal sinuses and mastoids are remarkable for extensive mucosal thickening  throughout the frontal and ethmoid sinuses bilaterally, and severe mucoperiosteal thickening in the maxillary sinuses bilaterally, compatible with chronic sinusitis.  IMPRESSION: 1.  No acute intracranial abnormalities. 2.  Cerebral and cerebellar atrophy with extensive chronic microvascular ischemic changes in the cerebral white matter, as above. 3.  Changes of chronic sinusitis, as above.   Original Report Authenticated By: Trudie Reed, M.D.      No diagnosis found.   Date: 08/03/2012  Rate: 73  Rhythm: normal sinus rhythm  QRS Axis: normal  Intervals: QT prolonged  ST/T Wave abnormalities: normal  Conduction Disutrbances:none  Narrative Interpretation:   Old EKG Reviewed: changes noted  LONGER QT since 03/2012 ABNORMAL  Cardiac: 80sr, normal  O2- 99%ra, normal  MDM  This elderly female with ongoing generalized illness presents after a syncopal episode.  On exam she is in no distress.  The patient's vital signs are largely unremarkable.  The patient is neurologically intact, and her labs are largely reassuring, with her comorbidities, and a new QT prolongation found on EKG, advised the patient that she should  be admitted for further evaluation and management.  The patient was adamant that she return home.  Her husband was part of this discussion, and he agreed that the patient's wishes were reasonable, and he agreed with the plan to go home AGAINST MEDICAL ADVICE.  We discussed the absence of a prohibition against return for concerning changes in her condition, the necessity of following up with her primary care physician and her cardiologist, and she was discharged, per her wishes.   Gerhard Munch, MD 08/03/12 [phone]

## 2012-08-05 LAB — URINE CULTURE

## 2012-08-06 ENCOUNTER — Ambulatory Visit (INDEPENDENT_AMBULATORY_CARE_PROVIDER_SITE_OTHER): Payer: Medicare Other | Admitting: Internal Medicine

## 2012-08-06 ENCOUNTER — Encounter: Payer: Self-pay | Admitting: Internal Medicine

## 2012-08-06 ENCOUNTER — Telehealth: Payer: Self-pay | Admitting: Cardiology

## 2012-08-06 VITALS — BP 118/78 | HR 67 | Temp 97.0°F | Wt 141.0 lb

## 2012-08-06 DIAGNOSIS — J310 Chronic rhinitis: Secondary | ICD-10-CM

## 2012-08-06 DIAGNOSIS — R55 Syncope and collapse: Secondary | ICD-10-CM

## 2012-08-06 DIAGNOSIS — R9431 Abnormal electrocardiogram [ECG] [EKG]: Secondary | ICD-10-CM

## 2012-08-06 NOTE — Telephone Encounter (Signed)
Unable to reach pt. Mailbox not set up yet

## 2012-08-06 NOTE — Telephone Encounter (Signed)
Scott or Lawson Fiscal could see her today. It looks like Lawson Fiscal has an opening today.  Please see if Lawson Fiscal would see her today. I had sent a message to [name] earlier today about arranging a post ED appt for her.

## 2012-08-06 NOTE — Telephone Encounter (Signed)
New problem:    Would like to be seen today if possible. Seen in er on Friday at [facility] for passing out.

## 2012-08-06 NOTE — Patient Instructions (Addendum)
Ms. Seeger was seen Nov 13th by Drs., Rhea Belton and Delford Field. She was diagnosed with sinusitis was started on levaquin. She was feeling ill with the respiratory issues and also felt she had a viral illness - a gastroenteritis(?). Friday, NOv 15th, she went to [facility], felt ill and then had a witnessed loss of consciousness with loss of control of bowel and bladder. She reports she knew she was going down and had lost control of function but doesn't remember anything else until she was in the ambulance and felt confused and disoriented for a longer period of time. ED eval reviewed: CT brain w/o acute change, chronic atrophy noted; EKG with NSR QT 472 ms (previous EKG 11/13 QT 468 ms); U/A positive for LE and bacteria with negative culture. Troponin was ,0.30, CBC with normal WBC but 80 % segs, Bmet normal including BUN/Cr.   On exam today you appear to be normal: no sinus tenderness, TM right looks normal, lungs without wheezing, mild chronic rhonchi but no acute findings. No neurologic findings on exam.  Plan Continue all your routine medication.  No indication for antibiotics  Take Mucinex 1200 mg twice a day  Hydrate  Continue nasal saline wash  If you are not doing better by Wednesday call me so I can see you on Thursday for reassessement.

## 2012-08-06 NOTE — Telephone Encounter (Signed)
Follow-up:    When attempting to schedule patient, patient stated that she would prefer to see Dr. Shirlee Latch himself and would like to speak with you about this.  Please call back.

## 2012-08-06 NOTE — Progress Notes (Signed)
Subjective:    Patient ID: Lori [name], female    DOB: [birth year], 78 y.o.   MRN: [id number]  HPI Lori [name] was seen Nov 13th by Drs., Lori [name] and Lori [name]. She was diagnosed with sinusitis was started on levaquin. She was feeling ill with the respiratory issues and also felt she had a viral illness - a gastroenteritis(?). Friday, NOv 15th, she went to [facility], felt ill and then had a witnessed loss of consciousness with loss of control of bowel and bladder. She reports she knew she was going down and had lost control of function but doesn't remember anything else until she was in the ambulance and felt confused and disoriented for a longer period of time. ED eval reviewed: CT brain w/o acute change, chronic atrophy noted; EKG with NSR QT 472 ms (previous EKG 11/13 QT 468 ms); U/A positive for LE and bacteria with negative culture. Troponin was ,0.30, CBC with normal WBC but 80 % segs, Bmet normal including BUN/Cr.   Since Friday she has stopped all medications, including levaquin. She has continued to have a cough productive of thick, yellow mucus, no fever, not more SOB than usual, her appetite has been good, no recurrent nausea. She has had increased flatus.   Past Medical History  Diagnosis Date  . COPD (chronic obstructive pulmonary disease)   . Asthma   . ALLERGIC RHINITIS   . Hypertension   . Pleural effusion, left   . Colles' fracture of left radius   . Anxiety   . Back pain of thoracolumbar region     right  . Pruritus   . Diastolic dysfunction   . Pulmonary nodule   . IBS (irritable bowel syndrome)   . Bronchiectasis   . Insomnia   . History of pneumonia   . Diverticulosis   . Hx of colonic polyp   . Multiple rib fractures 10/2008    bilateral   Past Surgical History  Procedure Date  . Tonsillectomy   . Appendectomy   . Cholecystectomy   . Polypectomy   . Bladder suspension 2005    A-P  . Rotator cuff repair 2006    right  . Orif wrist fracture 2010    left wrist.  Dr.  Mina [name].  . Total knee arthroplasty 08/2010    right  . Tonsillectomy   . Colonoscopy 04/06/2012    Procedure: COLONOSCOPY;  Surgeon: Lori Fiedler, MD;  Location: [facility];  Service: Gastroenterology;  Laterality: N/A;   Family History  Problem Relation Age of Onset  . Heart disease Mother   . Colon cancer Neg Hx   . Bipolar disorder Daughter   . Rheumatic fever Mother   . Pneumonia Father    History   Social History  . Marital Status: Married    Spouse Name: N/A    Number of Children: 3  . Years of Education: N/A   Occupational History  . retired    Social History Main Topics  . Smoking status: Former Smoker -- 0.3 packs/day for 10 years    Types: Cigarettes    Quit date: 09/19/1948  . Smokeless tobacco: Never Used  . Alcohol Use: Yes     Comment: rarely  . Drug Use: No  . Sexually Active: No   Other Topics Concern  . Not on file   Social History Narrative   College - [employer]; [place] - [place] [employer]. Married 1959 - spouse s/p valve surgery with resulting paralysis hemidiaphragm ('10). 3 daughter,  5 grandsons, 4 granddaughters.  1 daughter bipolar - social issues. She remains very active, but can't play tennis. Pt was apprently ex-smoker, but husband does not recollect pt ever smoking. Stressful move to smaller quarters (fall '09) and now moving to [place] (fall '12).    Current Outpatient Prescriptions on File Prior to Visit  Medication Sig Dispense Refill  . acetaminophen (TYLENOL) 500 MG tablet Take 500 mg by mouth 2 (two) times daily as needed.       Marland Kitchen albuterol (PROVENTIL HFA;VENTOLIN HFA) 108 (90 BASE) MCG/ACT inhaler Inhale 2 puffs into the lungs every 6 (six) hours as needed for wheezing.  1 Inhaler  2  . Artificial Tears 0.1-0.3 % SOLN Apply to eye as needed.      Marland Kitchen aspirin EC 81 MG tablet Take 1 tablet (81 mg total) by mouth daily.      . bifidobacterium infantis (ALIGN) capsule Take 1 capsule by mouth daily.  90 capsule  3  . bimatoprost (LUMIGAN)  0.01 % SOLN Place 1 drop into both eyes daily.  5 mL  3  . budesonide-formoterol (SYMBICORT) 160-4.5 MCG/ACT inhaler Inhale 2 puffs into the lungs 2 (two) times daily.  1 Inhaler  12  . colesevelam (WELCHOL) 625 MG tablet ON HOLD      . dicyclomine (BENTYL) 10 MG capsule Take 1 tablet by mouth three times a day as needed for urgency.  90 capsule  3  . esomeprazole (NEXIUM) 40 MG capsule Take 40 mg by mouth daily.      . fluticasone (FLONASE) 50 MCG/ACT nasal spray Place 2 sprays into the nose daily.  16 g  2  . guaiFENesin (ROBITUSSIN) 100 MG/5ML liquid Take by mouth 2 (two) times daily.      Marland Kitchen ibuprofen (ADVIL,MOTRIN) 100 MG tablet Take 100 mg by mouth every 6 (six) hours as needed.        . Loperamide HCl (IMODIUM A-D) 1 MG/7.5ML LIQD Take by mouth as directed  150 mL  0  . Multiple Vitamin (MULTIVITAMIN) tablet Take by mouth daily.        . nitroGLYCERIN (NITROSTAT) 0.4 MG SL tablet Place 1 tablet (0.4 mg total) under the tongue every 5 (five) minutes as needed for chest pain.  100 tablet  3  . sodium chloride (OCEAN) 0.65 % nasal spray Place 1 spray into the nose as needed.      . valsartan-hydrochlorothiazide (DIOVAN-HCT) 160-12.5 MG per tablet Take 1 tablet by mouth daily.  90 tablet  3  . zolpidem (AMBIEN) 10 MG tablet Take 1 tablet (10 mg total) by mouth at bedtime as needed.  30 tablet  5  . levofloxacin (LEVAQUIN) 500 MG tablet Take 1 tablet (500 mg total) by mouth daily.  10 tablet  0  . valACYclovir (VALTREX) 1000 MG tablet Take 1,000 mg by mouth daily.        Review of Systems System review is negative for any constitutional, cardiac, pulmonary, GI or neuro symptoms or complaints other than as described in the HPI.     Objective:   Physical Exam Filed Vitals:   08/06/12 1215  BP: 118/78  Pulse: 67  Temp: 97 F (36.1 C)   Gen'l- WNWD nicely groomed white woman in no distress HEENT - C&S clear Cor- 2+ radial pulse, RRR Pulm - rhonchi at the bases, no wheezing, no  increased WOB, no use of accessory muscles, no pursed lip breathing Neuro - A&O x 3, speech is clear, CN  II_XII grossly intact, motor - normal gait, no tremor.       Assessment & Plan:  1. Rhinitis- flare of symptoms but no evidence of bacterial infection at this time.  Plan Continue all your routine medication.  No indication for antibiotics  Take Mucinex 1200 mg twice a day  Hydrate  Continue nasal saline wash  If you are not doing better by Wednesday call me so I can see you on Thursday for reassessement.   2. QT prolongation - in the ED there was concern for QT prolongation with interval of 472 ms (baseline of 468)  Plan Minor QT prolongation possibly due to levaquin - no further evaluation at this time.  Follow EKG at next OV

## 2012-08-07 NOTE — Telephone Encounter (Signed)
LMTCB

## 2012-08-08 DIAGNOSIS — R55 Syncope and collapse: Secondary | ICD-10-CM | POA: Insufficient documentation

## 2012-08-08 NOTE — Assessment & Plan Note (Signed)
Episode Nov 15th, '13 of syncope and collapse with incontinence of bowel and bladder with a 20+ minute post-ictal period. ED evaluation with normal CT head, labs were ok, EKG with mild prolonged QT interval. She has since that time done well with no neurologic sequelae. Question of vaso-vagal episode with a gastroenteritis vs CNS event with no prior h/o of seizure.  Her ED evaluation was thorough and she has had been doing well.  Plan No further studies at this time but low threshold for neuro eval with EEG and consultation

## 2012-08-08 NOTE — Telephone Encounter (Signed)
LMTCB--unable to reach pt by telephone several attempts. Pt did see Dr Debby Bud 08/06/12.

## 2012-08-09 ENCOUNTER — Encounter: Payer: Self-pay | Admitting: Cardiology

## 2012-08-09 ENCOUNTER — Ambulatory Visit (INDEPENDENT_AMBULATORY_CARE_PROVIDER_SITE_OTHER): Payer: Medicare Other | Admitting: Cardiology

## 2012-08-09 ENCOUNTER — Telehealth: Payer: Self-pay | Admitting: Internal Medicine

## 2012-08-09 VITALS — BP 118/70 | HR 77 | Ht 65.5 in | Wt 138.8 lb

## 2012-08-09 DIAGNOSIS — I10 Essential (primary) hypertension: Secondary | ICD-10-CM

## 2012-08-09 DIAGNOSIS — R55 Syncope and collapse: Secondary | ICD-10-CM

## 2012-08-09 DIAGNOSIS — R9431 Abnormal electrocardiogram [ECG] [EKG]: Secondary | ICD-10-CM

## 2012-08-09 DIAGNOSIS — J0100 Acute maxillary sinusitis, unspecified: Secondary | ICD-10-CM

## 2012-08-09 MED ORDER — DOXYCYCLINE HYCLATE 100 MG PO TABS: 100.0000 mg | ORAL_TABLET | Freq: Two times a day (BID) | ORAL | Status: DC

## 2012-08-09 NOTE — Progress Notes (Signed)
Lori [name] Date of Birth: [birth year] Medical Record #[id number]  History of Present Illness: Lori [name] is seen today as a work in for evaluation of syncope. She has a history of COPD and asthma. She really has no significant cardiac history. She had a normal echocardiogram and nuclear stress test in the fall of 2012. Recently she has been battling a severe sinus infection and upper respiratory illness. She began feeling so bad that she had to cancel all her normal activities. She was initially started on Levaquin. This past Friday she was walking up to the counter at a drugstore and she felt faint and passed out. She reports incontinence. She states it took about an hour for her to be aware of what was going on. She was taken to the emergency room where cranial CT was unremarkable. Chest x-ray was clear. Blood work was unremarkable. ECG did show significant QT prolongation to 520 ms. Prior ECG in July showed a normal QT. She has had no recurrent syncope or fainting since the spell. She refused hospital admission. She continues to complain of a lot of chest congestion with a cough productive of yellowish green sputum. She is weak and unsteady on her feet.  Current Outpatient Prescriptions on File Prior to Visit  Medication Sig Dispense Refill  . acetaminophen (TYLENOL) 500 MG tablet Take 500 mg by mouth 2 (two) times daily as needed.       Marland Kitchen albuterol (PROVENTIL HFA;VENTOLIN HFA) 108 (90 BASE) MCG/ACT inhaler Inhale 2 puffs into the lungs every 6 (six) hours as needed for wheezing.  1 Inhaler  2  . Artificial Tears 0.1-0.3 % SOLN Apply to eye as needed.      Marland Kitchen aspirin EC 81 MG tablet Take 1 tablet (81 mg total) by mouth daily.      . bifidobacterium infantis (ALIGN) capsule Take 1 capsule by mouth daily.  90 capsule  3  . bimatoprost (LUMIGAN) 0.01 % SOLN Place 1 drop into both eyes daily.  5 mL  3  . budesonide-formoterol (SYMBICORT) 160-4.5 MCG/ACT inhaler Inhale 2 puffs into the lungs 2 (two)  times daily.  1 Inhaler  12  . colesevelam (WELCHOL) 625 MG tablet ON HOLD      . dicyclomine (BENTYL) 10 MG capsule Take 1 tablet by mouth three times a day as needed for urgency.  90 capsule  3  . esomeprazole (NEXIUM) 40 MG capsule Take 40 mg by mouth daily.      . fluticasone (FLONASE) 50 MCG/ACT nasal spray Place 2 sprays into the nose daily.  16 g  2  . guaiFENesin (ROBITUSSIN) 100 MG/5ML liquid Take by mouth 2 (two) times daily.      Marland Kitchen ibuprofen (ADVIL,MOTRIN) 100 MG tablet Take 100 mg by mouth every 6 (six) hours as needed.        . Loperamide HCl (IMODIUM A-D) 1 MG/7.5ML LIQD Take by mouth as directed  150 mL  0  . Multiple Vitamin (MULTIVITAMIN) tablet Take by mouth daily.        . nitroGLYCERIN (NITROSTAT) 0.4 MG SL tablet Place 1 tablet (0.4 mg total) under the tongue every 5 (five) minutes as needed for chest pain.  100 tablet  3  . sodium chloride (OCEAN) 0.65 % nasal spray Place 1 spray into the nose as needed.      . valACYclovir (VALTREX) 1000 MG tablet Take 1,000 mg by mouth daily.      . valsartan-hydrochlorothiazide (DIOVAN-HCT) 160-12.5 MG per tablet  Take 1 tablet by mouth daily.  90 tablet  3  . zolpidem (AMBIEN) 10 MG tablet Take 1 tablet (10 mg total) by mouth at bedtime as needed.  30 tablet  5  . levofloxacin (LEVAQUIN) 500 MG tablet Take 1 tablet (500 mg total) by mouth daily.  10 tablet  0    Allergies  Allergen Reactions  . Amoxicillin     REACTION: unspecified  . Levaquin (Levofloxacin In D5w) Other (See Comments)    QT prolongation  . Penicillins     REACTION: causes hives  . Sulfonamide Derivatives Nausea Only    Past Medical History  Diagnosis Date  . COPD (chronic obstructive pulmonary disease)   . Asthma   . ALLERGIC RHINITIS   . Hypertension   . Pleural effusion, left   . Colles' fracture of left radius   . Anxiety   . Back pain of thoracolumbar region     right  . Pruritus   . Diastolic dysfunction   . Pulmonary nodule   . IBS (irritable  bowel syndrome)   . Bronchiectasis   . Insomnia   . History of pneumonia   . Diverticulosis   . Hx of colonic polyp   . Multiple rib fractures 10/2008    bilateral    Past Surgical History  Procedure Date  . Tonsillectomy   . Appendectomy   . Cholecystectomy   . Polypectomy   . Bladder suspension 2005    A-P  . Rotator cuff repair 2006    right  . Orif wrist fracture 2010    left wrist.  Dr. Mina Marble.  . Total knee arthroplasty 08/2010    right  . Tonsillectomy   . Colonoscopy 04/06/2012    Procedure: COLONOSCOPY;  Surgeon: Beverley Fiedler, MD;  Location: [facility];  Service: Gastroenterology;  Laterality: N/A;    History  Smoking status  . Former Smoker -- 0.3 packs/day for 10 years  . Types: Cigarettes  . Quit date: 09/19/1948  Smokeless tobacco  . Never Used    History  Alcohol Use  . Yes    Comment: rarely    Family History  Problem Relation Age of Onset  . Heart disease Mother   . Colon cancer Neg Hx   . Bipolar disorder Daughter   . Rheumatic fever Mother   . Pneumonia Father     Review of Systems: As noted in history of present illness.  All other systems were reviewed and are negative.  Physical Exam: BP 118/70  Pulse 77  Ht 5' 5.5" (1.664 m)  Wt 138 lb 12.8 oz (62.959 kg)  BMI 22.75 kg/m2  SpO2 99% She is a pleasant white female who appears younger than her stated age. She is normocephalic, atraumatic. Pupils are equal round and reactive. Sclera are clear. Oropharynx is clear. Neck is supple without jugular venous distention, adenopathy, thyromegaly, or bruits. Lungs are clear. Cardiac exam reveals a regular rate and rhythm without gallop, murmur, or click. Abdomen is soft and nontender. No masses or bruits. Extremities are without edema. Pulses are 2+. She is alert and oriented x3. Cranial nerves II through XII are intact. Gait is normal. LABORATORY DATA: ECG today demonstrates normal sinus rhythm with occasional PAC. QTC is normal at 461  ms.  Assessment / Plan: 1. Syncope. I suspect her episode was related to transient QT prolongation related to the use of Levaquin. I recommended avoidance of use of the quinolone antibiotics in the future. She had normal carotid  Dopplers earlier this year. Prior cardiac evaluation has been normal. If she were to have any recurrent dizziness or syncope I would recommend an event monitor.  2. Upper respiratory infection with bronchitis and sinusitis. Started on doxycycline today. I recommended that she continue this along with Mucinex and an antihistamine.  3. COPD/asthma.

## 2012-08-09 NOTE — Patient Instructions (Signed)
Continue doxycycline and mucinex.   Do not take Levaquin in the future  You may add an antihistamine for your congestion.

## 2012-08-09 NOTE — Progress Notes (Signed)
Pt will see dr [place] today.

## 2012-08-09 NOTE — Telephone Encounter (Signed)
Mrs. Kuwahara called - she is still coughing and bringing up tenacious yellow phlegm. No fever, mild increase in SOB  Plan - Doxycycline 100 mg bid x 7  She is to call back if not improved.

## 2012-08-31 ENCOUNTER — Encounter: Payer: Self-pay | Admitting: Internal Medicine

## 2012-08-31 ENCOUNTER — Ambulatory Visit (INDEPENDENT_AMBULATORY_CARE_PROVIDER_SITE_OTHER): Payer: Medicare Other | Admitting: Internal Medicine

## 2012-08-31 VITALS — BP 126/80 | HR 77 | Temp 96.8°F

## 2012-08-31 DIAGNOSIS — R300 Dysuria: Secondary | ICD-10-CM

## 2012-08-31 DIAGNOSIS — R3915 Urgency of urination: Secondary | ICD-10-CM

## 2012-08-31 DIAGNOSIS — R350 Frequency of micturition: Secondary | ICD-10-CM

## 2012-08-31 MED ORDER — CIPROFLOXACIN HCL 500 MG PO TABS: 500.0000 mg | ORAL_TABLET | Freq: Two times a day (BID) | ORAL | Status: DC

## 2012-08-31 NOTE — Patient Instructions (Signed)
Urinary Tract Infection Urinary tract infections (UTIs) can develop anywhere along your urinary tract. Your urinary tract is your body's drainage system for removing wastes and extra water. Your urinary tract includes two kidneys, two ureters, a bladder, and a urethra. Your kidneys are a pair of bean-shaped organs. Each kidney is about the size of your fist. They are located below your ribs, one on each side of your spine. CAUSES Infections are caused by microbes, which are microscopic organisms, including fungi, viruses, and bacteria. These organisms are so small that they can only be seen through a microscope. Bacteria are the microbes that most commonly cause UTIs. SYMPTOMS  Symptoms of UTIs may vary by age and gender of the patient and by the location of the infection. Symptoms in young women typically include a frequent and intense urge to urinate and a painful, burning feeling in the bladder or urethra during urination. Older women and men are more likely to be tired, shaky, and weak and have muscle aches and abdominal pain. A fever may mean the infection is in your kidneys. Other symptoms of a kidney infection include pain in your back or sides below the ribs, nausea, and vomiting. DIAGNOSIS To diagnose a UTI, your caregiver will ask you about your symptoms. Your caregiver also will ask to provide a urine sample. The urine sample will be tested for bacteria and white blood cells. White blood cells are made by your body to help fight infection. TREATMENT  Typically, UTIs can be treated with medication. Because most UTIs are caused by a bacterial infection, they usually can be treated with the use of antibiotics. The choice of antibiotic and length of treatment depend on your symptoms and the type of bacteria causing your infection. HOME CARE INSTRUCTIONS  If you were prescribed antibiotics, take them exactly as your caregiver instructs you. Finish the medication even if you feel better after you  have only taken some of the medication.  Drink enough water and fluids to keep your urine clear or pale yellow.  Avoid caffeine, tea, and carbonated beverages. They tend to irritate your bladder.  Empty your bladder often. Avoid holding urine for long periods of time.  Empty your bladder before and after sexual intercourse.  After a bowel movement, women should cleanse from front to back. Use each tissue only once. SEEK MEDICAL CARE IF:   You have back pain.  You develop a fever.  Your symptoms do not begin to resolve within 3 days. SEEK IMMEDIATE MEDICAL CARE IF:   You have severe back pain or lower abdominal pain.  You develop chills.  You have nausea or vomiting.  You have continued burning or discomfort with urination. MAKE SURE YOU:   Understand these instructions.  Will watch your condition.  Will get help right away if you are not doing well or get worse. Document Released: 06/15/2005 Document Revised: 03/06/2012 Document Reviewed: 10/14/2011 Exit[employer] Patient Information 2013 [employer].  

## 2012-08-31 NOTE — Progress Notes (Signed)
HPI  Pt presents to the clinic today with c/o urinary urgency, frequency and dysuria x 4 days. She does have urinary incontinence which has been much worse over the past couple of days. She has been drinking cranberry juice which has helped. She does have a history of recurrent UTI's.   Review of Systems  Constitutional: Denies fever, malaise, fatigue, headache or abrupt weight changes.   GU: Pt reports urgency, frequency and pain with urination. Denies burning sensation, blood in urine, odor or discharge. Skin: Denies redness, rashes, lesions or ulcercations.   No other specific complaints in a complete review of systems (except as listed in HPI above).    Objective:   Physical Exam  General: Appears her stated age, well developed, well nourished in NAD. Cardiovascular: Normal rate and rhythm. S1,S2 noted.  No murmur, rubs or gallops noted. No JVD or BLE edema. No carotid bruits noted. Pulmonary/Chest: Normal effort and positive vesicular breath sounds. No respiratory distress. No wheezes, rales or ronchi noted.  Abdomen: Soft and nontender. Normal bowel sounds, no bruits noted. No distention or masses noted. Liver, spleen and kidneys non palpable. Tender to palpation over the bladder area. No CVA tenderness.      Assessment & Plan:   Urgency Frequency Dysuria  eRx sent if for Macrobid 100 mg BID x 5 days Drink plenty of fluids  RTC as needed or if symptoms persist.

## 2012-09-17 ENCOUNTER — Ambulatory Visit (INDEPENDENT_AMBULATORY_CARE_PROVIDER_SITE_OTHER): Payer: Medicare Other | Admitting: Internal Medicine

## 2012-09-17 ENCOUNTER — Encounter: Payer: Self-pay | Admitting: Internal Medicine

## 2012-09-17 VITALS — BP 122/82 | HR 96 | Temp 97.5°F | Ht 65.0 in | Wt 141.8 lb

## 2012-09-17 DIAGNOSIS — J441 Chronic obstructive pulmonary disease with (acute) exacerbation: Secondary | ICD-10-CM

## 2012-09-17 DIAGNOSIS — J45909 Unspecified asthma, uncomplicated: Secondary | ICD-10-CM

## 2012-09-17 DIAGNOSIS — Z01811 Encounter for preprocedural respiratory examination: Secondary | ICD-10-CM

## 2012-09-17 MED ORDER — PREDNISONE 10 MG PO TABS: ORAL_TABLET | ORAL | Status: DC

## 2012-09-17 MED ORDER — ALBUTEROL SULFATE (2.5 MG/3ML) 0.083% IN NEBU: 2.5000 mg | INHALATION_SOLUTION | Freq: Four times a day (QID) | RESPIRATORY_TRACT | Status: DC | PRN

## 2012-09-17 MED ORDER — DOXYCYCLINE HYCLATE 100 MG PO TABS: 100.0000 mg | ORAL_TABLET | Freq: Two times a day (BID) | ORAL | Status: DC

## 2012-09-17 MED ORDER — ALBUTEROL SULFATE (2.5 MG/3ML) 0.083% IN NEBU
2.5000 mg | INHALATION_SOLUTION | Freq: Once | RESPIRATORY_TRACT | Status: AC
  Administered 2012-09-17: 2.5 mg via RESPIRATORY_TRACT
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
## 2012-09-17 NOTE — Patient Instructions (Addendum)
You are having flare of chronic persistent asthma/copd; do no think this is due to flu but likely due to flu like illness WE will give you nebulizer albuterol treatment right now in office Take doxycycline 100mg  po twice daily x 5 days; take after meals and avoid sunlight Take prednisone 40 mg daily x 2 days, then 20mg  daily x 2 days, then 10mg  daily x 2 days, then 5mg  daily x 2 days and stop AT home, continue daily symbicort 2 puff twice daily without fail (very important you do this daily esp with upcoming surgery); CMA wil ensure you have refills Glad you had flu shot this season Use albuterol nebulizer as needed for acute shortness of breath and wheeze- we will do 15 doses - if using more than 4 times a day, call MD immediately. This is strictly for emergency use I wil inform Dr Rhea Belton today about current illness so he can inform [facility] physician REturn in 2-3 months with full PFT

## 2012-09-20 ENCOUNTER — Telehealth: Payer: Self-pay | Admitting: Internal Medicine

## 2012-09-20 DIAGNOSIS — J441 Chronic obstructive pulmonary disease with (acute) exacerbation: Secondary | ICD-10-CM | POA: Insufficient documentation

## 2012-09-20 NOTE — Telephone Encounter (Signed)
Lori [name]  She is curently onabx and prednisone for ae-asthma/copd. She can have polyp removeal at [facility] if exacerbation is resolved by tome of surgery. The [facility] people do need to know she has copd/chronic persistent asthma with baseline 50% lung function  Thanks  MR

## 2012-09-20 NOTE — Assessment & Plan Note (Signed)
You are having flare of chronic persistent asthma/copd I do no think this is due to flu but likely due to flu like illness (t his is due to face she has had flu shot and she lacks classic flu symptoms) WE will give you nebulizer albuterol treatment right now in office Take doxycycline 100mg  po twice daily x 5 days; take after meals and avoid sunlight Take prednisone 40 mg daily x 2 days, then 20mg  daily x 2 days, then 10mg  daily x 2 days, then 5mg  daily x 2 days and stop AT home, continue daily symbicort 2 puff twice daily without fail (very important you do this daily esp with upcoming surgery); CMA wil ensure you have refills Glad you had flu shot this season Use albuterol nebulizer as needed for acute shortness of breath and wheeze- we will do 15 doses - if using more than 4 times a day, call MD immediately. This is strictly for emergency use I wil inform Dr Rhea Belton today about current illness so he can inform [facility] physician REturn in 2-3 months with full PFT

## 2012-09-20 NOTE — Assessment & Plan Note (Signed)
DEspite obstructive lung disease at possibly 50% her functional , nutiritonal and preserve renal status suggests sh will be low risk for polyp resection. Currently illness does raise risk for pulmonary complications but as along as she is not actively in aecopd/ae-asthma at time of scope, she should be ok to have procedure

## 2012-09-21 ENCOUNTER — Telehealth: Payer: Self-pay | Admitting: Internal Medicine

## 2012-09-21 NOTE — Telephone Encounter (Signed)
Per 12.30.13 ov w/ MR: Patient Instructions     You are having flare of chronic persistent asthma/copd; do no think this is due to flu but likely due to flu like illness  WE will give you nebulizer albuterol treatment right now in office  Take doxycycline 100mg  po twice daily x 5 days; take after meals and avoid sunlight  Take prednisone 40 mg daily x 2 days, then 20mg  daily x 2 days, then 10mg  daily x 2 days, then 5mg  daily x 2 days and stop  AT home, continue daily symbicort 2 puff twice daily without fail (very important you do this daily esp with upcoming surgery); CMA wil ensure you have refills  Glad you had flu shot this season  Use albuterol nebulizer as needed for acute shortness of breath and wheeze- we will do 15 doses - if using more than 4 times a day, call MD immediately. This is strictly for emergency use  I wil inform Dr Rhea Belton today about current illness so he can inform [facility] physician  REturn in 2-3 months with full PFT  =================================================== Called spoke with patient who reported that she took a neb treatment w/ albuterol x1 this morning at approx 8:30am.  She began feeling shaky, jittery and reported that her hands are shaking so bad that she can barely hold a fork/spoon to feed herself.  Also reported that she felt as though her HR had increased though she did not count; "it scared me to death."  Symptoms have only slightly improved since taking the albuterol neb.  Pt stated that [facility] delivered "enough to last me 2 years" rather than the 15 doses that MR ordered above.  Pt is requesting recs.  MR not in office in office.  Will forward to doc of the day.  Dr Vassie Loll please advise, thanks.

## 2012-09-21 NOTE — Telephone Encounter (Signed)
This is side effect of nebuliser medication She should stop taking this & await further instructions from Dr Marchelle Gearing

## 2012-09-21 NOTE — Telephone Encounter (Signed)
Called, spoke with pt.  Informed her of below recs per Dr. Vassie Loll.  She verbalized understanding but was concerned bc she states her HR is "still so far up."  States "I can't see," I can't function," "I can't do anything," "I can't eat," and "My blood feels like it is boiling."  I asked pt was she meant by her blood boiling feels like it is boiling and she replied "I'm tingling all over."  Advised I would speak with RA and place her on hold while I do this.  I spoke with RA.  I informed him of all of pt's continuing symptoms.  Per RA, albuterol should only last in her system a few hours and should pass.  These symptoms should pass soon.  If for some reason they do not improve or worsen, she should seek emergency care.  Spoke with pt.  I explained above from RA to pt.  Pt states "Well, I thought your office would be more efficient than this" and then hung up the phone.    Will route to MR for follow recs.  Pls advise.  Thank you.

## 2012-09-22 ENCOUNTER — Emergency Department (HOSPITAL_COMMUNITY)
Admission: EM | Admit: 2012-09-22 | Discharge: 2012-09-22 | Disposition: A | Discharge status: Home / Self Care | Payer: Medicare Other | Attending: Emergency Medicine | Admitting: Emergency Medicine

## 2012-09-22 ENCOUNTER — Encounter (HOSPITAL_COMMUNITY): Payer: Self-pay

## 2012-09-22 ENCOUNTER — Emergency Department (HOSPITAL_COMMUNITY): Payer: Medicare Other

## 2012-09-22 DIAGNOSIS — Z8781 Personal history of (healed) traumatic fracture: Secondary | ICD-10-CM | POA: Insufficient documentation

## 2012-09-22 DIAGNOSIS — J45909 Unspecified asthma, uncomplicated: Secondary | ICD-10-CM | POA: Insufficient documentation

## 2012-09-22 DIAGNOSIS — R197 Diarrhea, unspecified: Secondary | ICD-10-CM

## 2012-09-22 DIAGNOSIS — J449 Chronic obstructive pulmonary disease, unspecified: Secondary | ICD-10-CM | POA: Insufficient documentation

## 2012-09-22 DIAGNOSIS — R0602 Shortness of breath: Secondary | ICD-10-CM | POA: Insufficient documentation

## 2012-09-22 DIAGNOSIS — Z7982 Long term (current) use of aspirin: Secondary | ICD-10-CM | POA: Insufficient documentation

## 2012-09-22 DIAGNOSIS — Z860100 Personal history of colon polyps, unspecified: Secondary | ICD-10-CM | POA: Insufficient documentation

## 2012-09-22 DIAGNOSIS — I4949 Other premature depolarization: Secondary | ICD-10-CM

## 2012-09-22 DIAGNOSIS — R5383 Other fatigue: Secondary | ICD-10-CM

## 2012-09-22 DIAGNOSIS — F411 Generalized anxiety disorder: Secondary | ICD-10-CM | POA: Insufficient documentation

## 2012-09-22 DIAGNOSIS — R059 Cough, unspecified: Secondary | ICD-10-CM | POA: Insufficient documentation

## 2012-09-22 DIAGNOSIS — J3489 Other specified disorders of nose and nasal sinuses: Secondary | ICD-10-CM | POA: Insufficient documentation

## 2012-09-22 DIAGNOSIS — R51 Headache: Secondary | ICD-10-CM | POA: Insufficient documentation

## 2012-09-22 DIAGNOSIS — J4489 Other specified chronic obstructive pulmonary disease: Secondary | ICD-10-CM | POA: Insufficient documentation

## 2012-09-22 DIAGNOSIS — IMO0001 Reserved for inherently not codable concepts without codable children: Secondary | ICD-10-CM | POA: Insufficient documentation

## 2012-09-22 DIAGNOSIS — Z87891 Personal history of nicotine dependence: Secondary | ICD-10-CM | POA: Insufficient documentation

## 2012-09-22 DIAGNOSIS — Z8701 Personal history of pneumonia (recurrent): Secondary | ICD-10-CM | POA: Insufficient documentation

## 2012-09-22 DIAGNOSIS — Z79899 Other long term (current) drug therapy: Secondary | ICD-10-CM | POA: Insufficient documentation

## 2012-09-22 DIAGNOSIS — R05 Cough: Secondary | ICD-10-CM | POA: Insufficient documentation

## 2012-09-22 DIAGNOSIS — R002 Palpitations: Secondary | ICD-10-CM

## 2012-09-22 DIAGNOSIS — I10 Essential (primary) hypertension: Secondary | ICD-10-CM | POA: Insufficient documentation

## 2012-09-22 DIAGNOSIS — R42 Dizziness and giddiness: Secondary | ICD-10-CM | POA: Insufficient documentation

## 2012-09-22 DIAGNOSIS — K921 Melena: Secondary | ICD-10-CM | POA: Insufficient documentation

## 2012-09-22 DIAGNOSIS — Z8709 Personal history of other diseases of the respiratory system: Secondary | ICD-10-CM | POA: Insufficient documentation

## 2012-09-22 LAB — CBC WITH DIFFERENTIAL/PLATELET
Eosinophils Absolute: 0.1 10*3/uL (ref 0.0–0.7)
Eosinophils Relative: 0 % (ref 0–5)
HCT: 44.5 % (ref 36.0–46.0)
Hemoglobin: 15.5 g/dL — ABNORMAL HIGH (ref 12.0–15.0)
Lymphocytes Relative: 26 % (ref 12–46)
Lymphs Abs: 3.5 10*3/uL (ref 0.7–4.0)
MCH: 29.1 pg (ref 26.0–34.0)
MCV: 83.5 fL (ref 78.0–100.0)
Monocytes Absolute: 1 10*3/uL (ref 0.1–1.0)
Monocytes Relative: 7 % (ref 3–12)
Platelets: 234 10*3/uL (ref 150–400)
RBC: 5.33 MIL/uL — ABNORMAL HIGH (ref 3.87–5.11)
WBC: 13.2 10*3/uL — ABNORMAL HIGH (ref 4.0–10.5)

## 2012-09-22 LAB — GLUCOSE, CAPILLARY: Glucose-Capillary: 81 mg/dL (ref 70–99)

## 2012-09-22 LAB — URINALYSIS, ROUTINE W REFLEX MICROSCOPIC
Glucose, UA: NEGATIVE mg/dL
Hgb urine dipstick: NEGATIVE
Ketones, ur: NEGATIVE mg/dL
Protein, ur: NEGATIVE mg/dL

## 2012-09-22 LAB — COMPREHENSIVE METABOLIC PANEL
ALT: 25 U/L (ref 0–35)
BUN: 17 mg/dL (ref 6–23)
CO2: 27 mEq/L (ref 19–32)
Calcium: 9.3 mg/dL (ref 8.4–10.5)
Creatinine, Ser: 0.55 mg/dL (ref 0.50–1.10)
GFR calc Af Amer: >90 mL/min (ref >90)
GFR calc non Af Amer: 84 mL/min — ABNORMAL LOW (ref >90)
Glucose, Bld: 86 mg/dL (ref 70–99)
Total Protein: 6.3 g/dL (ref 6.0–8.3)

## 2012-09-22 LAB — TROPONIN I: Troponin I: <0.3 ng/mL (ref <0.30)

## 2012-09-22 MED ORDER — SODIUM CHLORIDE 0.9 % IV BOLUS (SEPSIS)
500.0000 mL | Freq: Once | INTRAVENOUS | Status: AC
  Administered 2012-09-22: 500 mL via INTRAVENOUS

## 2012-09-22 MED ORDER — LORAZEPAM 1 MG PO TABS
1.0000 mg | ORAL_TABLET | Freq: Once | ORAL | Status: AC
  Administered 2012-09-22: 1 mg via ORAL
  Filled 2012-09-22: qty 1

## 2012-09-22 MED ORDER — IOHEXOL 350 MG/ML SOLN
100.0000 mL | Freq: Once | INTRAVENOUS | Status: AC | PRN
  Administered 2012-09-22: 100 mL via INTRAVENOUS

## 2012-09-22 NOTE — ED Notes (Signed)
JXB:[id number]<GN> Expected date:09/22/12<BR> Expected time:10:38 AM<BR> Means of arrival:Ambulance<BR> Comments:<BR> General complaints

## 2012-09-22 NOTE — ED Notes (Signed)
Pt escorted to discharge window. Verbalized understanding discharge instructions. In no acute distress.   

## 2012-09-22 NOTE — ED Notes (Addendum)
Took out Pt IV per Dr. Request due to pain

## 2012-09-22 NOTE — ED Provider Notes (Signed)
Please see Dr. Harmon Pier note.  Case is turned over to me to follow the patient's CT scan.  Dg Chest 2 View  09/22/2012  *RADIOLOGY REPORT*  Clinical Data: Shortness of breath.  Former smoker who quit many years ago.  Current history of hypertension, COPD, and asthma.  CHEST - 2 VIEW  Comparison: Two-view chest x-ray 08/03/2012, 04/04/2012, 09/14/2011.  Findings: Cardiac silhouette normal in size, unchanged.  Thoracic aorta mildly atherosclerotic, unchanged.  Hilar and mediastinal contours otherwise unremarkable.  Stable hyperinflation.  Lungs clear.  Bronchovascular markings normal.  Pulmonary vascularity normal.  No pneumothorax.  No pleural effusions.  Degenerative changes and DISH involving the thoracic spine.  Slight thoracic scoliosis convex right.  IMPRESSION: Stable hyperinflation consistent with COPD and/or asthma.  No acute cardiopulmonary disease.   Original Report Authenticated By: Hulan Saas, M.D.    Ct Angio Chest W/cm &/or Wo Cm  09/22/2012  *RADIOLOGY REPORT*  Clinical Data: Palpitations after albuterol treatment yesterday. Dizziness.  Current history of COPD, asthma, hypertension, and diastolic dysfunction.  CT ANGIOGRAPHY CHEST  Technique:  Multidetector CT imaging of the chest using the standard protocol during bolus administration of intravenous contrast. Multiplanar reconstructed images including MIPs were obtained and reviewed to evaluate the vascular anatomy.  Contrast: OMNIPAQUE IOHEXOL 350 MG/ML SOLN  Comparison: CTA chest 11/22/2010.  CT chest 11/20/2008, 10/15/2008.  Findings: Contrast opacification of the pulmonary arteries is moderate. No filling defects within either main pulmonary artery or their branches in either lung to suggest pulmonary embolism.  Heart size upper normal with left ventricular hypertrophy.  Moderate left circumflex and right coronary atherosclerosis.  Very small pericardial effusion.  Mild atherosclerosis involving the thoracic and upper abdominal aorta  without aneurysm or dissection.  Proximal great vessels patent though mildly atherosclerotic.  Hyperinflation.  Minimal scarring in the lower lobes and lingula. Sub-5 mm nodule peripherally in the superior segment left lower lobe (series 13, image 32), unchanged from the prior examination. No new or enlarging nodules in either lung.  No confluent airspace consolidation.  No pleural effusions.  Central airways patent with mild bronchial wall thickening.  Scattered normal-sized mediastinal, hilar, and axillary lymph nodes; no significant lymphadenopathy.  Visualized thyroid gland unremarkable.  Visualized upper abdomen unremarkable.  Bone window images demonstrate dish and diffuse thoracic spondylosis  IMPRESSION:  1.  No evidence of pulmonary embolism. 2.  Hyperinflation consistent with COPD and/or asthma.  No acute cardiopulmonary disease.   Original Report Authenticated By: Hulan Saas, M.D.      CT scan does not show any evidence of aneurysm, embolism or other acute cardiopulmonary abnormality.  At this time there does not appear to be any evidence of an acute emergency medical condition and the patient appears stable for discharge with appropriate outpatient follow up.   Celene Kras, MD 09/22/12 [phone]

## 2012-09-22 NOTE — ED Notes (Signed)
Pt sts "I have been sick since November and I feel like I've been hit by a truck."  Sts heart palpitations after taking albuterol yesterday.  Pt's husband sts she has had dizziness and tingling all over since yesterday.

## 2012-09-22 NOTE — ED Provider Notes (Signed)
History     CSN: [id number]  Arrival date & time 09/22/12  1041   First MD Initiated Contact with Patient 09/22/12 1051      Chief Complaint  Patient presents with  . Weakness    (Consider location/radiation/quality/duration/timing/severity/associated sxs/prior treatment) HPI Comments: Patient presents to to generalized fatigue, heart palpitations, persistent cough with production and diarrhea as well as multiple other symptomatic complaints. She reports that she was taking diagnosed with the flu approximately in November and was put on Levaquin. She reports that she had a seizure due to the Levaquin. She reports more recently around Christmas time she developed another illness that was also told to her as flu with runny nose, coughing and congestion with productive yellow sputum. She denies any overt fever chills. She was put on doxycycline both by Dr. Kirstie Peri and more recently by her pulmonologist and also received a breathing treatment in the office last week. Despite having chronic COPD, she reports that she does not usually use albuterol at home. She was given a nebulizer by her pulmonologist use at home and she used it one time yesterday morning and reports since then she has felt very jittery, anxious, heart palpitations without significant chest pain and inability to sleep. She also endorses multiple watery stools since yesterday. She thinks that she had about 6 episodes yesterday and some this morning. She denies nausea vomiting, or any significant abdominal discomfort. She is also concerned because she is scheduled to have a polyp removed from her right intestine by a gastroenterologist at [facility]. She is supposed to start a bowel prep tomorrow and she is concerned that she may have to cancel this procedure. She denies any other new medications. I reviewed her epic notes including all of her multiple telephone conversations with her pulmonology clinic as well as her primary care  clinic.  Patient is a 79 y.o. female presenting with weakness. The history is provided by the patient and medical records.  Weakness The primary symptoms include dizziness. Primary symptoms do not include headaches, fever, nausea or vomiting.  Dizziness also occurs with weakness. Dizziness does not occur with nausea or vomiting.   Additional symptoms include weakness.    Past Medical History  Diagnosis Date  . COPD (chronic obstructive pulmonary disease)   . Asthma   . ALLERGIC RHINITIS   . Hypertension   . Pleural effusion, left   . Colles' fracture of left radius   . Anxiety   . Back pain of thoracolumbar region     right  . Pruritus   . Diastolic dysfunction   . Pulmonary nodule   . IBS (irritable bowel syndrome)   . Bronchiectasis   . Insomnia   . History of pneumonia   . Diverticulosis   . Hx of colonic polyp   . Multiple rib fractures 10/2008    bilateral    Past Surgical History  Procedure Date  . Tonsillectomy   . Appendectomy   . Cholecystectomy   . Polypectomy   . Bladder suspension 2005    A-P  . Rotator cuff repair 2006    right  . Orif wrist fracture 2010    left wrist.  Dr. Mina Marble.  . Total knee arthroplasty 08/2010    right  . Tonsillectomy   . Colonoscopy 04/06/2012    Procedure: COLONOSCOPY;  Surgeon: Beverley Fiedler, MD;  Location: [facility];  Service: Gastroenterology;  Laterality: N/A;    Family History  Problem Relation Age of Onset  . Heart  disease Mother   . Colon cancer Neg Hx   . Bipolar disorder Daughter   . Rheumatic fever Mother   . Pneumonia Father     History  Substance Use Topics  . Smoking status: Former Smoker -- 0.3 packs/day for 10 years    Types: Cigarettes    Quit date: 09/19/1948  . Smokeless tobacco: Never Used  . Alcohol Use: Yes     Comment: rarely    OB History    Grav Para Term Preterm Abortions TAB SAB Ect Mult Living                  Review of Systems  Constitutional: Positive for appetite  change and fatigue. Negative for fever and chills.  HENT: Positive for rhinorrhea.   Respiratory: Positive for cough and shortness of breath.   Cardiovascular: Positive for palpitations. Negative for chest pain and leg swelling.  Gastrointestinal: Positive for diarrhea and blood in stool. Negative for nausea, vomiting and abdominal pain.  Musculoskeletal: Positive for myalgias.  Neurological: Positive for dizziness, weakness and light-headedness. Negative for headaches.  All other systems reviewed and are negative.    Allergies  Amoxicillin; Levaquin; Penicillins; and Sulfonamide derivatives  Home Medications   Current Outpatient Rx  Name  Route  Sig  Dispense  Refill  . ACETAMINOPHEN 500 MG PO TABS   Oral   Take 500 mg by mouth 2 (two) times daily as needed.          . ALBUTEROL SULFATE HFA 108 (90 BASE) MCG/ACT IN AERS   Inhalation   Inhale 2 puffs into the lungs every 6 (six) hours as needed for wheezing.   1 Inhaler   2   . ALBUTEROL SULFATE (2.5 MG/3ML) 0.083% IN NEBU   Nebulization   Take 3 mLs (2.5 mg total) by nebulization every 6 (six) hours as needed for wheezing.   75 mL   12   . ARTIFICIAL TEARS 0.1-0.3 % OP SOLN   Ophthalmic   Apply to eye as needed.         . ASPIRIN EC 81 MG PO TBEC   Oral   Take 1 tablet (81 mg total) by mouth daily.         Marland Kitchen ALIGN PO CAPS   Oral   Take 1 capsule by mouth daily.   90 capsule   3     Lot # [id number]  Exp  06/2012   . BIMATOPROST 0.01 % OP SOLN   Both Eyes   Place 1 drop into both eyes daily.   5 mL   3   . BUDESONIDE-FORMOTEROL FUMARATE 160-4.5 MCG/ACT IN AERO   Inhalation   Inhale 2 puffs into the lungs 2 (two) times daily.   1 Inhaler   12   . COLESEVELAM HCL 625 MG PO TABS      ON HOLD         . DICYCLOMINE HCL 10 MG PO CAPS   Oral   Take 10 mg by mouth 3 (three) times daily as needed.         Marland Kitchen DOXYCYCLINE HYCLATE 100 MG PO TABS   Oral   Take 1 tablet (100 mg total) by mouth 2  (two) times daily.   10 tablet   0   . ESOMEPRAZOLE MAGNESIUM 40 MG PO CPDR   Oral   Take 40 mg by mouth daily.         . GUAIFENESIN 100 MG/5ML PO LIQD  Oral   Take by mouth 2 (two) times daily.         . IBUPROFEN 100 MG PO TABS   Oral   Take 100 mg by mouth every 6 (six) hours as needed.           Marland Kitchen LOPERAMIDE HCL 1 MG/7.5ML PO LIQD      Take by mouth as directed   150 mL   0   . ONE-DAILY MULTI VITAMINS PO TABS   Oral   Take by mouth daily.           Marland Kitchen NITROGLYCERIN 0.4 MG SL SUBL   Sublingual   Place 1 tablet (0.4 mg total) under the tongue every 5 (five) minutes as needed for chest pain.   100 tablet   3   . PREDNISONE 10 MG PO TABS      Take 4 tabs po x 2 days, then 2 x 2 days, then 1 x 2 days, then 1/2 x 2 days then stop.   15 tablet   0   . SALINE NASAL SPRAY 0.65 % NA SOLN   Nasal   Place 1 spray into the nose as needed.         Marland Kitchen VALACYCLOVIR HCL 1 G PO TABS   Oral   Take 1,000 mg by mouth daily.         Marland Kitchen VALSARTAN-HYDROCHLOROTHIAZIDE 160-12.5 MG PO TABS   Oral   Take 1 tablet by mouth daily.   90 tablet   3   . ZOLPIDEM TARTRATE 10 MG PO TABS   Oral   Take 1 tablet (10 mg total) by mouth at bedtime as needed.   30 tablet   5     BP 178/111  Pulse 72  Temp 97.7 F (36.5 C) (Oral)  Resp 16  SpO2 99%  Physical Exam  Nursing note and vitals reviewed. Constitutional: She appears well-developed and well-nourished.  Non-toxic appearance. She does not have a sickly appearance. She does not appear ill. No distress.       Patient has on lipstick and full facial makeup  HENT:  Head: Normocephalic and atraumatic.  Nose: No mucosal edema or rhinorrhea.  Mouth/Throat: Uvula is midline and oropharynx is clear and moist.  Eyes: Pupils are equal, round, and reactive to light.  Neck: Normal range of motion. Neck supple.  Cardiovascular: Regular rhythm and normal pulses.  Frequent extrasystoles are present.  No murmur  heard. Pulmonary/Chest: Effort normal. No respiratory distress. She has no wheezes. She has no rales.  Abdominal: Soft.  Neurological: She is alert.  Skin: Skin is warm. No rash noted. She is not diaphoretic. No pallor.  Psychiatric: Her mood appears anxious. Her speech is rapid and/or pressured and tangential. Her speech is not delayed and not slurred. She is is hyperactive. She is not slowed. Cognition and memory are not impaired. She is communicative.    ED Course  Procedures (including critical care time)   Labs Reviewed  CBC WITH DIFFERENTIAL  COMPREHENSIVE METABOLIC PANEL  URINALYSIS, ROUTINE W REFLEX MICROSCOPIC   No results found.   No diagnosis found.  Room air saturation is 95% which I interpret to be normal.  ECG at time 11:07 shows sinus rhythm with multiple PVCs and PACs. Prolonged QT as noted with a QTc interval of 502 ms. No significant ST or T wave abnormalities are noted. QT prolongation was seen previously on ECG from 08/03/2012.  2:12 PM Patient has remained mostly in a  tachycardic rate while in the emergency department. She was given Ativan to help relax her anxiety. Patient's blood pressure changed minimally with standing and patient was not symptomatically effected on orthostatic evaluation. Urinalysis is pending. Given her persistent cough, shortness of breath along with dyspnea and tachycardia, will perform a CT angios to rule out pulmonary embolism. This can also be used to look for subtle pneumonia or cardiac effusion.   3:15 PM Pt's HR is now back to normal at 85.  CT angio is still pending.      4:00 PM Pt reports feeling much improved after IV ativan, palpitations are much improved.  Her HR and BP improved as well.  CT results to be followed up by Dr. Lynelle Doctor.  Impression: Anxiety PVC's diarrhea  MDM   Patient with various complaints but normal oxygenation here in no apparent respiratory distress with no overt wheezing on my examination. I  suspect her URI was more from a viral infection and her diarrhea is explainable on being on doxycycline. Initially, side effects from albuterol could explain her initial anxiety and palpitations, however she reports that she has not used any since yesterday morning. She indeed does have frequent premature ventricular complexes both on monitor and EKG which explains her palpitations. However likely this is not affecting her hemodynamic status. I suspect that this is causing her to be more anxious as well. It does warrant that we check her electrolytes given her diarrhea yesterday and today. Her blood pressure is elevated which again is explainable by her current anxiety state. Her abdomen is otherwise soft and nontender. My plan is to also check a urinalysis and other basic labs. Her white count is somewhat elevated here today which could be explained by stress and also her recent use of steroids. Her ECG also shows prolonged QT interval, however was also prolonged back in November.        Gavin Pound. Oletta Lamas, MD 09/23/12 2032

## 2012-09-22 NOTE — ED Notes (Signed)
Per EMS, Pt, from [facility], c/o of generalized weakness and fatigue starting this AM.  Pt sts "I feel like I've been hit by a truck."  Denies pain.  Vitals are stable.  Hx of HTN.  EMS reports multiple PACs and a PVC in route.

## 2012-09-22 NOTE — ED Notes (Signed)
Unsuccessfully attempted to start an IV for CT angio.  Shelby Mattocks, RN, in room attempting to start IV.

## 2012-09-22 NOTE — Discharge Instructions (Signed)
 Diet for Diarrhea, Adult Having frequent, runny stools (diarrhea) has many causes. Diarrhea may be caused or worsened by food or drink. Diarrhea may be relieved by changing your diet. IF YOU ARE NOT TOLERATING SOLID FOODS:  Drink enough water  and fluids to keep your urine clear or pale yellow.  Avoid sugary drinks and sodas as well as milk-based beverages.  Avoid beverages containing caffeine and alcohol .  You may try rehydrating beverages. You can make your own by following this recipe:   tsp table salt.   tsp baking soda.   tsp salt substitute (potassium chloride ).  1 tbs + 1 tsp sugar.  1 qt water . As your stools become more solid, you can start eating solid foods. Add foods one at a time. If a certain food causes your diarrhea to get worse, avoid that food and try other foods. A low fiber, low-fat, and lactose-free diet is recommended. Small, frequent meals may be better tolerated.  Starches  Allowed:  White, [place], and pita breads, plain rolls, buns, bagels. Plain muffins, matzo. Soda, saltine, or graham crackers. Pretzels, melba toast, zwieback. Cooked cereals made with water : cornmeal, farina, cream cereals. Dry cereals: refined corn, wheat, rice. Potatoes prepared any way without skins, refined macaroni, spaghetti, noodles, refined rice.  Avoid:  Bread, rolls, or crackers made with whole wheat, multi-grains, rye, bran seeds, nuts, or coconut. Corn tortillas or taco shells. Cereals containing whole grains, multi-grains, bran, coconut, nuts, or raisins. Cooked or dry oatmeal. Coarse wheat cereals, granola. Cereals advertised as high-fiber. Potato skins. Whole grain pasta, wild or brown rice. Popcorn. Sweet potatoes/yams. Sweet rolls, doughnuts, waffles, pancakes, sweet breads. Vegetables  Allowed: Strained tomato and vegetable juices. Most well-cooked and canned vegetables without seeds. Fresh: Tender lettuce, cucumber without the skin, cabbage, spinach, bean  sprouts.  Avoid: Fresh, cooked, or canned: Artichokes, baked beans, beet greens, broccoli, Brussels sprouts, corn, kale, legumes, peas, sweet potatoes. Cooked: Green or red cabbage, spinach. Avoid large servings of any vegetables, because vegetables shrink when cooked, and they contain more fiber per serving than fresh vegetables. Fruit  Allowed: All fruit juices except prune juice. Cooked or canned: Apricots, applesauce, cantaloupe, cherries, fruit cocktail, grapefruit, grapes, kiwi, mandarin oranges, peaches, pears, plums, watermelon. Fresh: Apples without skin, ripe banana, grapes, cantaloupe, cherries, grapefruit, peaches, oranges, plums. Keep servings limited to  cup or 1 piece.  Avoid: Fresh: Apple with skin, apricots, mango, pears, raspberries, strawberries. Prune juice, stewed or dried prunes. Dried fruits, raisins, dates. Large servings of all fresh fruits. Meat and Meat Substitutes  Allowed: Ground or well-cooked tender beef, ham, veal, lamb, pork, or poultry. Eggs, plain cheese. Fish, oysters, shrimp, lobster, other seafoods. Liver, organ meats.  Avoid: Tough, fibrous meats with gristle. Peanut butter, smooth or chunky. Cheese, nuts, seeds, legumes, dried peas, beans, lentils. Milk  Allowed: Yogurt, lactose-free milk, kefir, drinkable yogurt, buttermilk, soy milk.  Avoid: Milk, chocolate milk, beverages made with milk, such as milk shakes. Soups  Allowed: Bouillon, broth, or soups made from allowed foods. Any strained soup.  Avoid: Soups made from vegetables that are not allowed, cream or milk-based soups. Desserts and Sweets  Allowed: Sugar-free gelatin, sugar-free frozen ice pops made without sugar alcohol .  Avoid: Plain cakes and cookies, pie made with allowed fruit, pudding, custard, cream pie. Gelatin, fruit, ice, sherbet, frozen ice pops. Ice cream, ice milk without nuts. Plain hard candy, honey, jelly, molasses, syrup, sugar, chocolate syrup, gumdrops,  marshmallows. Fats and Oils  Allowed: Avoid any fats and oils.  Avoid:  Seeds, nuts, olives, avocados. Margarine, butter, cream, mayonnaise, salad oils, plain salad dressings made from allowed foods. Plain gravy, crisp bacon without rind. Beverages  Allowed: Water , decaffeinated teas, oral rehydration solutions, sugar-free beverages.  Avoid: Fruit juices, caffeinated beverages (coffee, tea, soda or pop), alcohol , sports drinks, or lemon-lime soda or pop. Condiments  Allowed: Ketchup, mustard, horseradish, vinegar, cream sauce, cheese sauce, cocoa powder. Spices in moderation: allspice, basil, bay leaves, celery powder or leaves, cinnamon, cumin powder, curry powder, ginger, mace, marjoram, onion or garlic powder, oregano, paprika, parsley flakes, ground pepper, rosemary, sage, savory, tarragon, thyme, turmeric.  Avoid: Coconut, honey. Weight Monitoring: Weigh yourself every day. You should weigh yourself in the morning after you urinate and before you eat breakfast. Wear the same amount of clothing when you weigh yourself. Record your weight daily. Bring your recorded weights to your clinic visits. Tell your caregiver right away if you have gained 3 lb/1.4 kg or more in 1 day, 5 lb/2.3 kg in a week, or whatever amount you were told to report. SEEK IMMEDIATE MEDICAL CARE IF:   You are unable to keep fluids down.  You start to throw up (vomit) or diarrhea keeps coming back (persistent).  Abdominal pain develops, increases, or can be felt in one place (localizes).  You have an oral temperature above 102 F (38.9 C), not controlled by medicine.  Diarrhea contains blood or mucus.  You develop excessive weakness, dizziness, fainting, or extreme thirst. MAKE SURE YOU:   Understand these instructions.  Will watch your condition.  Will get help right away if you are not doing well or get worse. Document Released: 11/26/2003 Document Revised: 11/28/2011 Document Reviewed:  01/20/2012 [facility] Patient Information 2013 [place], [place].   Premature Beats A premature beat is an extra heartbeat that happens earlier than normal. Premature beats are called premature atrial contractions (PACs) or premature ventricular contractions (PVCs) depending on the area of the heart where they start. CAUSES  Premature beats may be brought on by a variety of factors including:  Emotional stress.  Lack of sleep.  Caffeine.  Asthma medicines.  Stimulants.  Herbal teas.  Dietary supplements.  Alcohol . In most cases, premature beats are not dangerous and are not a sign of serious heart disease. Most patients evaluated for premature beats have completely normal heart function. Rarely, premature beats may be a sign of more significant heart problems or medical illness. SYMPTOMS  Premature beats may cause palpitations. This means you feel like your heart is skipping a beat or beating harder than usual. Sometimes, slight chest pain occurs with premature beats, lasting only a few seconds. This pain has been described as a flopping feeling inside the chest. In many cases, premature beats do not cause any symptoms and they are only detected when an electrocardiography test (EKG) or heart monitoring is performed. DIAGNOSIS  Your caregiver may run some tests to evaluate your heart such as an EKG or echocardiography. You may need to wear a portable heart monitor for several days to record the electrical activity of your heart. Blood testing may also be performed to check your electrolytes and thyroid  function. TREATMENT  Premature beats usually go away with rest. If the problem continues, your caregiver will determine a treatment plan for you.  HOME CARE INSTRUCTIONS  Get plenty of rest over the next few days until your symptoms improve.  Avoid coffee, tea, alcohol , and soda (pop, cola).  Do not smoke. SEEK MEDICAL CARE IF:  Your symptoms continue after 1 to  2 days of  rest.  You have new symptoms, such as chest pain or trouble breathing. SEEK IMMEDIATE MEDICAL CARE IF:  You have severe chest pain or abdominal pain.  You have pain that radiates into the neck, arm, or jaw.  You faint or have extreme weakness.  You have shortness of breath.  Your heartbeat races for more than 5 seconds. MAKE SURE YOU:  Understand these instructions.  Will watch your condition.  Will get help right away if you are not doing well or get worse. Document Released: 10/13/2004 Document Revised: 11/28/2011 Document Reviewed: 05/09/2011 [facility] Patient Information 2013 [place], [place].

## 2012-09-22 NOTE — ED Notes (Signed)
Pt was informed that we need a urine sample.  Pt was taken to the restroom via the steady, but could not urinate.

## 2012-09-23 NOTE — Telephone Encounter (Signed)
She had asked foir nebs because it had helped her in past.   Please followup asap. I Am out of town. IF not better, go to ER or come into see one of [place] soon. I am happy to see her , overbook if needed  She should inform her GI doc at [facility] what is going on as well.

## 2012-09-24 NOTE — Telephone Encounter (Signed)
According to epic the pt went to ER on 09-23-11. I ATC, NA, voicemail not set up. WCB. Carron Curie, CMA

## 2012-09-25 ENCOUNTER — Telehealth: Payer: Self-pay | Admitting: *Deleted

## 2012-09-25 ENCOUNTER — Encounter: Payer: Self-pay | Admitting: Internal Medicine

## 2012-09-25 ENCOUNTER — Ambulatory Visit (INDEPENDENT_AMBULATORY_CARE_PROVIDER_SITE_OTHER): Payer: Medicare Other | Admitting: Internal Medicine

## 2012-09-25 ENCOUNTER — Telehealth: Payer: Self-pay | Admitting: Internal Medicine

## 2012-09-25 VITALS — BP 122/68 | HR 50 | Temp 97.4°F | Resp 16 | Wt 140.0 lb

## 2012-09-25 DIAGNOSIS — Z7189 Other specified counseling: Secondary | ICD-10-CM

## 2012-09-25 DIAGNOSIS — I10 Essential (primary) hypertension: Secondary | ICD-10-CM

## 2012-09-25 DIAGNOSIS — J45909 Unspecified asthma, uncomplicated: Secondary | ICD-10-CM

## 2012-09-25 DIAGNOSIS — R002 Palpitations: Secondary | ICD-10-CM

## 2012-09-25 MED ORDER — VALSARTAN-HYDROCHLOROTHIAZIDE 160-12.5 MG PO TABS: 0.5000 | ORAL_TABLET | Freq: Every day | ORAL | Status: DC

## 2012-09-25 MED ORDER — LEVALBUTEROL TARTRATE 45 MCG/ACT IN AERO: 1.0000 | INHALATION_SPRAY | RESPIRATORY_TRACT | Status: DC | PRN

## 2012-09-25 NOTE — Telephone Encounter (Signed)
[facility] Triage Call Report Triage Record Num: [id number] Operator: Geanie Berlin Patient Name: [facility] Call Date & Time: 09/22/2012 9:03:57AM Patient Phone: [phone] PCP: Illene Regulus Patient Gender: Female PCP Fax : [phone] Patient DOB: 03/16/1925 [facility] Name: [facility] Reason for Call: Caller: [name] Lou/Patient; PCP: Illene Regulus (Adults only); CB#: [phone]; Call regarding Took Too Much Albuterol 09/21/12; feels very shaky with fast heart rate. Trembiling all over, weakness present. Onseet: 09/21/12. Afebrile. Been treated for "flu-like symptoms" for 1 month. Started on Albuterol nebulizer 09/21/12; reports she nearly fainted after treatment. Arms and back tingling. Advised to call 911 now for acute onset of weakness or numbness/tingling of any part of the body. Strongly advised that 911 in needed now for possible stroke or HTN. Weldon Inches if will call 911; stated he will discuss recommendation with wife. Protocol(s) Used: Weakness or Paralysis Recommended Outcome per Protocol: Activate EMS 911 Reason for Outcome: Acute onset of weakness or paralysis associated with loss of coordination (purposeful action) or numbness/tingling of any part of the body Care Advice: ~ Do not give the patient anything to eat or drink. ~ An adult should stay with the patient, preferably one trained in CPR. ~ IMMEDIATE ACTION Write down provider's name. List or place the following in a bag for transport with the patient: current prescription and/or nonprescription medications; alternative treatments, therapies and medications; and street drugs.

## 2012-09-25 NOTE — Telephone Encounter (Signed)
[facility] Triage Call Report Triage Record Num: [id number] Operator: Roselyn Meier Patient Name: Lori [name] Call Date & Time: 09/24/2012 5:51:54PM Patient Phone: [phone] PCP: Illene Regulus Patient Gender: Female PCP Fax : [phone] Patient DOB: 03/16/1925 [facility] Name: [facility] Reason for Call: Caller: Tom/Spouse; PCP: Illene Regulus (Adults only); CB#: [phone]; Call regarding Cough/Congestion; RN attempted to call patient back but there was no answer. RN has not talked with patient regarding symptoms; this call was a promise callback from a nurse. RN could not leave message as there was no voicemail set up Protocol(s) Used: Office Note Recommended Outcome per Protocol: Information Noted and Sent to Office Reason for Outcome: Caller information to office Care Advice:

## 2012-09-25 NOTE — Telephone Encounter (Signed)
Per Dr Rhea Belton, pt missed her appt at [facility] for a COLON today. lmom for pt to call.

## 2012-09-25 NOTE — Telephone Encounter (Signed)
Spoke with husband who stated he called [facility] x 3 yesterday and informed them wife had flu like s&s and couldn't make it today; pt also went to the ER recently. Called [phone] and they verified Mr Stalling called; apparently that ofc did not f/u and cancel the appt. Dr Rhea Belton Emailed Dr Shana Chute explaining this to her. I call Candace at [phone] and she will contact the pt to r/s.

## 2012-09-25 NOTE — Patient Instructions (Addendum)
Pulmonary disease - the primary problem is underlying asthmatic/COPD with shortness of breath and occasional breakthru wheezing. You need to use a maintenance inhaler to keep your condition under control = Symbicort 2 puffs twice a day every day and you need to have a RESCUE inhaler for when you have breakthru wheezing = xopenex two puffs every 4 hours as needed. This is much less stimulating than albuterol. If you need to use a RESCUE inhaler more than twice a day you need to be seen.  Chronic mucus production is not always equivalent to infection. In order to be able to clear the mucus effectively you should take Mucinex 1200 mg twice a day on a regular basis.  If you have a concern for any infection we will need to have lab to confirm infection, i.e a complete blood count or if there is a question of a Urinary track infection a urinalysis. This will facilitate very directed treatment and the avoidance of antibiotics when possible.  Always try to contact me or the office to get lab orders entered so that the examining physician will have the data. You can use MyChart to communicate to the office.  Generalized weakness - all your lab work Jan 4th was normal, X-ray and CT chest did not reveal any acute disease but confirmed the chronic disease. I believe that will recover given time.  Blood pressure - running a little low which may contribute to your weakness. Please reduce diovan/hct to 1/2 tablet daily. If this keeps your blood pressure under control over the next week a new prescription for that dose (80/12.5) will be called in.  Advance Care Planning (ACP) - you have made a clear statement of what you don't want: CPR, Mechanical ventilation. Please consider the sample MOST form and please go to the website "[email or web address]." We can take all possible steps to insure you have control of your care.

## 2012-09-25 NOTE — Telephone Encounter (Signed)
ATC x 1. NA. Msg says that the mailbox is full. WCB.

## 2012-09-26 DIAGNOSIS — Z7189 Other specified counseling: Secondary | ICD-10-CM | POA: Insufficient documentation

## 2012-09-26 NOTE — Assessment & Plan Note (Signed)
Patient clearly states she does not want CPR, no intubation, no heroic care.  She is provided: "Out of Facility Order" signed; sample most form; she is directed to "[email or web address]"

## 2012-09-26 NOTE — Progress Notes (Signed)
Subjective:    Patient ID: Lori [name], female    DOB: Feb 19, 1926, 79 y.o.   MRN: [id number]  HPI Lori [name] was seen Dec 13th for UTI treated with macrobid - with resolution of symtpoms.  She was seen by Dr. Marchelle [name] Dec 30th for exacerbation of her COPD. She was put to steroid taper, stated on albuterol nebs and started on doxycycline. Jan 4th she went to the ED for tachycardia, palpations, hypertension, tremors and weakness. Records reviewed: CXR w/o acute findings, CT angio - negative for PE or acute infiltrate, CBCD normal, chemistries normal, EKG with PVCs. She was released with diagnosis of reaction to albuterol and doxycycline. Since returning home she reports that she has remained weak, fatigued and mostly bedbound. She is frustrated in having been sick since November.   Reviewed her records and meds and tests with her.  Past Medical History  Diagnosis Date  . COPD (chronic obstructive pulmonary disease)   . Asthma   . ALLERGIC RHINITIS   . Hypertension   . Pleural effusion, left   . Colles' fracture of left radius   . Anxiety   . Back pain of thoracolumbar region     right  . Pruritus   . Diastolic dysfunction   . Pulmonary nodule   . IBS (irritable bowel syndrome)   . Bronchiectasis   . Insomnia   . History of pneumonia   . Diverticulosis   . Hx of colonic polyp   . Multiple rib fractures 10/2008    bilateral   Past Surgical History  Procedure Date  . Tonsillectomy   . Appendectomy   . Cholecystectomy   . Polypectomy   . Bladder suspension 2005    A-P  . Rotator cuff repair 2006    right  . Orif wrist fracture 2010    left wrist.  Dr. Mina [name].  . Total knee arthroplasty 08/2010    right  . Tonsillectomy   . Colonoscopy 04/06/2012    Procedure: COLONOSCOPY;  Surgeon: Lori Fiedler, MD;  Location: [facility];  Service: Gastroenterology;  Laterality: N/A;   Family History  Problem Relation Age of Onset  . Heart disease Mother   . Colon cancer Neg Hx   .  Bipolar disorder Daughter   . Rheumatic fever Mother   . Pneumonia Father    History   Social History  . Marital Status: Married    Spouse Name: N/A    Number of Children: 3  . Years of Education: N/A   Occupational History  . retired    Social History Main Topics  . Smoking status: Former Smoker -- 0.3 packs/day for 10 years    Types: Cigarettes    Quit date: 09/19/1948  . Smokeless tobacco: Never Used  . Alcohol Use: Yes     Comment: rarely  . Drug Use: No  . Sexually Active: No   Other Topics Concern  . Not on file   Social History Narrative   College - [employer]; [place] - [place]. Married 1959 - spouse s/p valve surgery with resulting paralysis hemidiaphragm ('10). 3 daughter, 5 grandsons, 4 granddaughters.  1 daughter bipolar - social issues. She remains very active, but can't play tennis. Pt was apprently ex-smoker, but husband does not recollect pt ever smoking. Stressful move to smaller quarters (fall '09) and now moving to [place] (fall '12).ACP - she clearly states No CPR, no mechanical ventilation and that quality of life is of key importance. Provided MOST  sample, directed to [email or web address] and provided an Out of Facility Order. (Jan '14)    Current Outpatient Prescriptions on File Prior to Visit  Medication Sig Dispense Refill  . calcium-vitamin D (OSCAL WITH D) 500-200 MG-UNIT per tablet Take 1 tablet by mouth daily.      . Multiple Vitamin (MULTIVITAMIN) tablet Take by mouth daily.        . valsartan-hydrochlorothiazide (DIOVAN-HCT) 160-12.5 MG per tablet Take 0.5 tablets by mouth daily.  90 tablet  3  . zolpidem (AMBIEN) 10 MG tablet Take 1 tablet (10 mg total) by mouth at bedtime as needed.  30 tablet  5  . acetaminophen (TYLENOL) 500 MG tablet Take 500 mg by mouth 2 (two) times daily as needed. Pain      . Artificial Tears 0.1-0.3 % SOLN Place 1 drop into both eyes as needed.       Marland Kitchen aspirin EC 81 MG tablet Take 1 tablet (81 mg total) by  mouth daily.      . bifidobacterium infantis (ALIGN) capsule Take 1 capsule by mouth daily.  90 capsule  3  . bimatoprost (LUMIGAN) 0.01 % SOLN Place 1 drop into both eyes daily.  5 mL  3  . budesonide-formoterol (SYMBICORT) 160-4.5 MCG/ACT inhaler Inhale 2 puffs into the lungs 2 (two) times daily.  1 Inhaler  12  . dicyclomine (BENTYL) 10 MG capsule Take 10 mg by mouth 3 (three) times daily as needed. IBS      . guaiFENesin (ROBITUSSIN) 100 MG/5ML liquid Take by mouth 2 (two) times daily.      Marland Kitchen ibuprofen (ADVIL,MOTRIN) 100 MG tablet Take 100 mg by mouth every 6 (six) hours as needed.        . levalbuterol (XOPENEX HFA) 45 MCG/ACT inhaler Inhale 1-2 puffs into the lungs every 4 (four) hours as needed for wheezing.  1 Inhaler  12  . Loperamide HCl (IMODIUM A-D) 1 MG/7.5ML LIQD Take by mouth as directed  150 mL  0  . nitrofurantoin (MACRODANTIN) 50 MG capsule Take 50 mg by mouth daily.      . nitroGLYCERIN (NITROSTAT) 0.4 MG SL tablet Place 1 tablet (0.4 mg total) under the tongue every 5 (five) minutes as needed for chest pain.  100 tablet  3  . pramoxine-hydrocortisone (PROCTOCREAM-HC) 1-1 % rectal cream Place 1 application rectally 3 (three) times daily.      . sodium chloride (OCEAN) 0.65 % nasal spray Place 1 spray into the nose as needed.      . valACYclovir (VALTREX) 1000 MG tablet Take 1,000 mg by mouth daily.          Review of Systems System review is negative for any constitutional, cardiac, pulmonary, GI or neuro symptoms or complaints other than as described in the HPI.     Objective:   Physical Exam Filed Vitals:   09/25/12 1629  BP: 122/68  Pulse: 50  Temp: 97.4 F (36.3 C)  Resp: 16   Gen'l- well groomed woman in no distress HEENT- C&S clear, PERRLA Cor- RRR Pulm - normal respirations       Assessment & Plan:

## 2012-09-26 NOTE — Assessment & Plan Note (Signed)
Patient with chronic lung disease. Spent a great deal of time reviewing the disease process and treatment goals with maintenance meds and rescue meds (See AVS).  Plan Continue symbicort bid every day  Use Xopenex HFA for rescue - call if needing more than two treatments a day  Will strive to always get lab confirmation of infection before using antibiotics.  (greater than 50% of 75 min visit spent on education and counseling)

## 2012-09-26 NOTE — Telephone Encounter (Signed)
ATC x 1. NA.

## 2012-09-26 NOTE — Assessment & Plan Note (Signed)
BP Readings from Last 3 Encounters:  09/25/12 122/68  09/22/12 198/67  09/17/12 122/82   Good control most of the time

## 2012-09-26 NOTE — Assessment & Plan Note (Signed)
Recent exacerbation most likely due to combination of steroids and albuterol. Now stable.  Plan Change rescue med from albuterol to xopenex

## 2012-09-27 NOTE — Telephone Encounter (Signed)
I spoke with the pt and she states she is doing better. She states she went to the ER and was there for a day. The pt states ER told her she was over medicated with the albuterol. Pt states she only took one dose. I asked the pt if this started after she received med in office and she states what I gave her in the office did not cause any issues. She did not have side effects until she took the albuterol at home. Pt thought the med we gave her in the office was different but I advised it was the same med.  She states she is better and nothing further needed. Albuterol has been added to med list and pt is aware not to take this. Carron Curie, CMA

## 2012-10-03 ENCOUNTER — Ambulatory Visit (INDEPENDENT_AMBULATORY_CARE_PROVIDER_SITE_OTHER): Payer: Medicare Other | Admitting: Physician Assistant

## 2012-10-03 ENCOUNTER — Encounter: Payer: Self-pay | Admitting: Physician Assistant

## 2012-10-03 VITALS — BP 152/70 | HR 77 | Ht 65.0 in | Wt 142.6 lb

## 2012-10-03 DIAGNOSIS — J449 Chronic obstructive pulmonary disease, unspecified: Secondary | ICD-10-CM

## 2012-10-03 DIAGNOSIS — R002 Palpitations: Secondary | ICD-10-CM

## 2012-10-03 DIAGNOSIS — R55 Syncope and collapse: Secondary | ICD-10-CM

## 2012-10-03 DIAGNOSIS — N390 Urinary tract infection, site not specified: Secondary | ICD-10-CM

## 2012-10-03 DIAGNOSIS — R079 Chest pain, unspecified: Secondary | ICD-10-CM

## 2012-10-03 NOTE — Patient Instructions (Addendum)
Your physician recommends that you schedule a follow-up appointment in: 4-6 WEEKS WITH DR. [facility]  Your physician has requested that you have en exercise stress myoview. For further information please visit [email or web address] Please follow instruction sheet, as given.  Your physician has recommended that you wear an event monitor. Event monitors are medical devices that record the heart's electrical activity. Doctors most often [place] these monitors to diagnose arrhythmias. Arrhythmias are problems with the speed or rhythm of the heartbeat. The monitor is a small, portable device. You can wear one while you do your normal daily activities. This is usually used to diagnose what is causing palpitations/syncope (passing out).  MAKE SURE TO FOLLOW UP WITH DR. Marchelle Gearing PER SCOTT WEAVER, PAC  MAKE SURE TO CALL URGENT CARE DOCTOR ABOUT YOUR ANTIBIOTIC FOR UTI AND EXPLAIN TO THEM THAT YOU HAVE A HISTORY OF  LONG QT WITH QUINOLONES; THEY WILL NEED TO CHANGE YOUR MEDICATION PER SCOTT WEAVER, PAC
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
## 2012-10-08 ENCOUNTER — Ambulatory Visit (INDEPENDENT_AMBULATORY_CARE_PROVIDER_SITE_OTHER): Payer: Medicare Other

## 2012-10-08 DIAGNOSIS — R002 Palpitations: Secondary | ICD-10-CM

## 2012-10-08 DIAGNOSIS — R55 Syncope and collapse: Secondary | ICD-10-CM

## 2012-10-08 DIAGNOSIS — R079 Chest pain, unspecified: Secondary | ICD-10-CM

## 2012-10-08 NOTE — Progress Notes (Signed)
Placed the patient on 30 days event monitor and went over instructions on how to use it and when to return it. 

## 2012-10-09 ENCOUNTER — Ambulatory Visit (HOSPITAL_COMMUNITY): Payer: Medicare Other | Attending: Cardiology | Admitting: Radiology

## 2012-10-09 VITALS — Ht 65.0 in | Wt 141.0 lb

## 2012-10-09 DIAGNOSIS — R0989 Other specified symptoms and signs involving the circulatory and respiratory systems: Secondary | ICD-10-CM | POA: Insufficient documentation

## 2012-10-09 DIAGNOSIS — R0609 Other forms of dyspnea: Secondary | ICD-10-CM | POA: Insufficient documentation

## 2012-10-09 DIAGNOSIS — R0789 Other chest pain: Secondary | ICD-10-CM | POA: Insufficient documentation

## 2012-10-09 DIAGNOSIS — I2510 Atherosclerotic heart disease of native coronary artery without angina pectoris: Secondary | ICD-10-CM

## 2012-10-09 DIAGNOSIS — J45909 Unspecified asthma, uncomplicated: Secondary | ICD-10-CM | POA: Insufficient documentation

## 2012-10-09 DIAGNOSIS — R079 Chest pain, unspecified: Secondary | ICD-10-CM

## 2012-10-09 DIAGNOSIS — R0602 Shortness of breath: Secondary | ICD-10-CM

## 2012-10-09 DIAGNOSIS — R55 Syncope and collapse: Secondary | ICD-10-CM | POA: Insufficient documentation

## 2012-10-09 DIAGNOSIS — R002 Palpitations: Secondary | ICD-10-CM | POA: Insufficient documentation

## 2012-10-09 DIAGNOSIS — I10 Essential (primary) hypertension: Secondary | ICD-10-CM | POA: Insufficient documentation

## 2012-10-09 MED ORDER — TECHNETIUM TC 99M SESTAMIBI GENERIC - CARDIOLITE
30.0000 | Freq: Once | INTRAVENOUS | Status: AC | PRN
  Administered 2012-10-09: 30 via INTRAVENOUS

## 2012-10-09 MED ORDER — TECHNETIUM TC 99M SESTAMIBI GENERIC - CARDIOLITE
10.0000 | Freq: Once | INTRAVENOUS | Status: AC | PRN
  Administered 2012-10-09: 10 via INTRAVENOUS

## 2012-10-09 NOTE — Progress Notes (Signed)
[facility] SITE 3 NUCLEAR MED [address] [place], [place] [address] [phone]    Cardiology Nuclear Med Study  Lori [name] is a 79 y.o. female     MRN : [id number]     DOB: 13-Jul-1926  Procedure Date: 10/09/2012  Nuclear Med Background Indication for Stress Test:  Evaluation for Ischemia,and Patient seen in hospital ED on 09-22-12 for fatigue, palpitations,  enzymes negative History:  Asthma,COPD, '05 Heart Catheterization-nonobstructive CAD, EF=55%, '12 Myocardial Perfusion Study-no ischemia, [name]), '12 Echo: EF=55-60% Cardiac Risk Factors: Carotid Disease, History of Smoking, Hypertension and Lipids  Symptoms: Chest tightness with exertion radiates to left neck  Diaphoresis, DOE, Fatigue, Fatigue with Exertion, Light-Headedness, Palpitations, SOB and Syncope   Nuclear Pre-Procedure Caffeine/Decaff Intake:  None NPO After: 7:30am   Lungs:  clear O2 Sat: 97% on room air. IV 0.9% NS with Angio Cath:  22g  IV Site: R Hand  IV Started by:  Doyne Keel, CNMT  Chest Size (in):  36 Cup Size: D  Height: 5\' 5"  (1.651 m)  Weight:  141 lb (63.957 kg)  BMI:  Body mass index is 23.46 kg/(m^2). Tech Comments:  Held all am meds; patient had an orange this am at 7:30a    Nuclear Med Study 1 or 2 day study: 1 day  Stress Test Type:  Stress  Reading MD: Olga Millers, MD  Order Authorizing Provider:  Marca Ancona, MD  Resting Radionuclide: Technetium 11m Sestamibi  Resting Radionuclide Dose: 11.0 mCi   Stress Radionuclide:  Technetium 54m Sestamibi  Stress Radionuclide Dose: 33.0 mCi           Stress Protocol Rest HR: 72 Stress HR: 127  Rest BP: 165/87 Stress BP: 166/82  Exercise Time (min): 4:16 METS: 6.1   Predicted Max HR: 136 bpm % Max HR: 93.38 bpm Rate Pressure Product: 21308    Dose of Adenosine (mg):  n/a Dose of Lexiscan: n/a mg  Dose of Atropine (mg): n/a Dose of Dobutamine: n/a mcg/kg/min (at max HR)  Stress Test Technologist: Irean Hong, RN   Nuclear Technologist:  Domenic Polite, CNMT     Rest Procedure:  Myocardial perfusion imaging was performed at rest 45 minutes following the intravenous administration of Technetium 61m Sestamibi. Rest ECG: NSR - Normal EKG  Stress Procedure:  The patient exercised on the treadmill utilizing the Bruce Protocol for 4:16 minutes, RPE=15.  The patient stopped due to DOE and denied any chest pain.  Technetium 51m Sestamibi was injected at peak exercise and myocardial perfusion imaging was performed after a brief delay. Stress ECG: No significant ST segment change suggestive of ischemia.  QPS Raw Data Images:  Acquisition technically good; normal left ventricular size. Stress Images:  There is decreased uptake in the anterior wall. Rest Images:  There is decreased uptake in the anterior wall. Subtraction (SDS):  No evidence of ischemia. Transient Ischemic Dilatation (Normal <1.22):  0.95 Lung/Heart Ratio (Normal <0.45):  0.29  Quantitative Gated Spect Images QGS EDV:  60 ml QGS ESV:  15 ml  Impression Exercise Capacity:  Poor exercise capacity. BP Response:  Normal blood pressure response. Clinical Symptoms:  There is dyspnea. ECG Impression:  No significant ST segment change suggestive of ischemia. Comparison with Prior Nuclear Study: No images to compare  Overall Impression:  Normal stress nuclear study with a small, mild, fixed distal anterior defect consistent with soft tissue attenuation; no ischemia.  LV Ejection Fraction: 75%.  LV Wall Motion:  NL LV Function; NL  Wall Motion  Olga Millers

## 2012-10-11 ENCOUNTER — Telehealth: Payer: Self-pay | Admitting: *Deleted

## 2012-10-11 ENCOUNTER — Encounter: Payer: Self-pay | Admitting: *Deleted

## 2012-10-11 ENCOUNTER — Encounter: Payer: Self-pay | Admitting: Physician Assistant

## 2012-10-11 NOTE — Telephone Encounter (Signed)
Message copied by Tarri Fuller on Thu Oct 11, 2012  3:23 PM ------      Message from: [place], [place] T      Created: Thu Oct 11, 2012  8:23 AM       Please inform patient stress test normal.      Tereso Newcomer, PA-C  8:23 AM 10/11/2012

## 2012-10-12 NOTE — Telephone Encounter (Signed)
cannot lvm due to mail box will not allow.

## 2012-11-12 ENCOUNTER — Encounter: Payer: Self-pay | Admitting: Cardiology

## 2012-11-12 ENCOUNTER — Ambulatory Visit (INDEPENDENT_AMBULATORY_CARE_PROVIDER_SITE_OTHER): Payer: Medicare Other | Admitting: Cardiology

## 2012-11-12 VITALS — BP 118/80 | HR 76 | Ht 65.0 in | Wt 139.0 lb

## 2012-11-12 DIAGNOSIS — I6523 Occlusion and stenosis of bilateral carotid arteries: Secondary | ICD-10-CM

## 2012-11-12 DIAGNOSIS — I658 Occlusion and stenosis of other precerebral arteries: Secondary | ICD-10-CM

## 2012-11-12 DIAGNOSIS — R9431 Abnormal electrocardiogram [ECG] [EKG]: Secondary | ICD-10-CM

## 2012-11-12 DIAGNOSIS — R55 Syncope and collapse: Secondary | ICD-10-CM

## 2012-11-12 DIAGNOSIS — I10 Essential (primary) hypertension: Secondary | ICD-10-CM

## 2012-11-12 DIAGNOSIS — I519 Heart disease, unspecified: Secondary | ICD-10-CM

## 2012-11-12 DIAGNOSIS — I6529 Occlusion and stenosis of unspecified carotid artery: Secondary | ICD-10-CM
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
## 2012-11-12 NOTE — Patient Instructions (Addendum)
Your physician has requested that you have a carotid duplex. This test is an ultrasound of the carotid arteries in your neck. It looks at blood flow through these arteries that supply the brain with blood. Allow one hour for this exam. There are no restrictions or special instructions. May 2014  Your physician wants you to follow-up in: 8 months with Dr Shirlee Latch. (October 2014). You will receive a reminder letter in the mail two months in advance. If you don't receive a letter, please call our office to schedule the follow-up appointment.

## 2012-11-13 NOTE — Addendum Note (Signed)
Addended by: Elmarie Shiley on: 11/13/2012 08:59 AM   Modules accepted: Orders

## 2012-12-18 ENCOUNTER — Ambulatory Visit (INDEPENDENT_AMBULATORY_CARE_PROVIDER_SITE_OTHER): Payer: Medicare Other | Admitting: Internal Medicine

## 2012-12-18 ENCOUNTER — Encounter: Payer: Self-pay | Admitting: Internal Medicine

## 2012-12-18 VITALS — BP 160/78 | HR 76 | Temp 97.7°F | Resp 20 | Ht 65.0 in | Wt 138.0 lb

## 2012-12-18 DIAGNOSIS — IMO0001 Reserved for inherently not codable concepts without codable children: Secondary | ICD-10-CM

## 2012-12-18 DIAGNOSIS — M7918 Myalgia, other site: Secondary | ICD-10-CM

## 2012-12-18 NOTE — Progress Notes (Signed)
Pt could not stand for a weight today but she verbally told me her weight(documented)

## 2012-12-18 NOTE — Patient Instructions (Addendum)
Pain in the right buttock in the region of the gluteus Minimus and the sacro-iliac joint. This is probably an overuse injury.  Treatment today: injection to the trigger point - point of maximal pain- with xylocain, a numbing medicine, and depomedrol, a long acting steroid. The xylocain wears off in 1-2 hours and the depomedrol takes effect in 6-8 hours and works for 7 days.  Plan Anti-inflammatory medication - Aleve (naproxen sodium) 220 mg, may take two tablets twice a day  Heat - any rub of choice, heat patch or low level heating pad.  For continued pain and disability you need to see a physical therapist or a sports medicine physician. I can recommend Denice Paradise and let me know or you can call directly yourself.   May take Tylenol 500 mg two tablets three times a day.

## 2012-12-19 MED ORDER — METHYLPREDNISOLONE ACETATE 80 MG/ML IJ SUSP: 40.0000 mg | Freq: Once | INTRAMUSCULAR | Status: DC

## 2012-12-19 NOTE — Progress Notes (Signed)
Subjective:    Patient ID: Lori [name], female    DOB: 07/18/1926, 79 y.o.   MRN: [id number]  HPI Mrs. Dry played golf about 4 days ago while in [place]. Upon return home, a long drive, she developed severe pain in the right buttock area. It hurts to sit, stand, walk or shift her weight. She is able to bear weight. No report of bowel or bladder problems. No report of fall or other injury. No loss of sensation in the leg or clear cut weakness.  PMH, FamHx and SocHx reviewed for any changes and relevance.  Current Outpatient Prescriptions on File Prior to Visit  Medication Sig Dispense Refill  . acetaminophen (TYLENOL) 500 MG tablet Take 500 mg by mouth 2 (two) times daily as needed. Pain      . Artificial Tears 0.1-0.3 % SOLN Place 1 drop into both eyes as needed.       Marland Kitchen aspirin EC 81 MG tablet Take 81 mg by mouth as needed.       . bifidobacterium infantis (ALIGN) capsule Take 1 capsule by mouth daily.  90 capsule  3  . bimatoprost (LUMIGAN) 0.01 % SOLN Place 1 drop into both eyes daily.  5 mL  3  . budesonide-formoterol (SYMBICORT) 160-4.5 MCG/ACT inhaler Inhale 2 puffs into the lungs 2 (two) times daily.  1 Inhaler  12  . calcium-vitamin D (OSCAL WITH D) 500-200 MG-UNIT per tablet Take 1 tablet by mouth as needed.       . dicyclomine (BENTYL) 10 MG capsule Take 10 mg by mouth 3 (three) times daily as needed. IBS      . guaiFENesin (ROBITUSSIN) 100 MG/5ML liquid Take by mouth as needed.       Marland Kitchen ibuprofen (ADVIL,MOTRIN) 100 MG tablet Take 100 mg by mouth every 6 (six) hours as needed.        . levalbuterol (XOPENEX HFA) 45 MCG/ACT inhaler Inhale 1-2 puffs into the lungs every 4 (four) hours as needed for wheezing.  1 Inhaler  12  . Loperamide HCl 1 MG/7.5ML LIQD as needed. Take by mouth as directed      . Multiple Vitamin (MULTIVITAMIN) tablet Take by mouth daily.        . nitrofurantoin (MACRODANTIN) 50 MG capsule Take 50 mg by mouth daily.      . pramoxine-hydrocortisone  (PROCTOCREAM-HC) 1-1 % rectal cream Place 1 application rectally as needed.       . sodium chloride (OCEAN) 0.65 % nasal spray Place 1 spray into the nose as needed.      . valACYclovir (VALTREX) 1000 MG tablet Take 1,000 mg by mouth as needed.       . valsartan-hydrochlorothiazide (DIOVAN-HCT) 160-12.5 MG per tablet Take 0.5 tablets by mouth daily.  90 tablet  3  . zolpidem (AMBIEN) 10 MG tablet Take 1 tablet (10 mg total) by mouth at bedtime as needed.  30 tablet  5  . nitroGLYCERIN (NITROSTAT) 0.4 MG SL tablet Place 1 tablet (0.4 mg total) under the tongue every 5 (five) minutes as needed for chest pain.  100 tablet  3   No current facility-administered medications on file prior to visit.      Review of Systems System review is negative for any constitutional, cardiac, pulmonary, GI or neuro symptoms or complaints other than as described in the HPI.     Objective:   Physical Exam Filed Vitals:   12/18/12 1625  BP: 160/78  Pulse: 76  Temp: 97.7 F (  36.5 C)  Resp: 20   Gen'l - WNWD older white woman in no acute distress but has great pain in the right buttock with movement. Required 1-2+ assist to get up from w/c and to step up to exam table. Cor- RRR Pulm - normal respirations. Ext - normal flexion right hip, no pain w/ internal or external rotation, no pain with AP pressure against the hip or with pressure against the greater trochanter. Very tender at a point over the gluteus minimus vs sacro-iliac joint. No palpable mass.  Procedure: trigger pont injection Consent - verbal informed consent Location - right buttock just lateral to presumed SI joint, approx 4 cm below pelvic posterior crest. Prep - betadine and alcohol Meds - .5cc 2% xylocaine with epi, 80 mg depo-medrol Inject - using #23g 1" needle Tolerated well with good results. No complications Routine precautions given.           Assessment & Plan:  Buttock pain - SI joint strain vs muscle strain. She had some  relief with injection to trigger point  Plan Aleve 1-2 bid  Heat and lineament  Referral to Denice Paradise, RPT

## 2013-01-08 ENCOUNTER — Encounter: Payer: Self-pay | Admitting: Internal Medicine

## 2013-01-16 ENCOUNTER — Encounter: Payer: Self-pay | Admitting: Internal Medicine

## 2013-01-16 ENCOUNTER — Ambulatory Visit (INDEPENDENT_AMBULATORY_CARE_PROVIDER_SITE_OTHER): Payer: Medicare Other | Admitting: Internal Medicine

## 2013-01-16 VITALS — BP 120/82 | HR 73 | Temp 97.0°F | Wt 138.8 lb

## 2013-01-16 DIAGNOSIS — M25511 Pain in right shoulder: Secondary | ICD-10-CM

## 2013-01-16 DIAGNOSIS — N3281 Overactive bladder: Secondary | ICD-10-CM

## 2013-01-16 DIAGNOSIS — N318 Other neuromuscular dysfunction of bladder: Secondary | ICD-10-CM

## 2013-01-16 DIAGNOSIS — M7500 Adhesive capsulitis of unspecified shoulder: Secondary | ICD-10-CM

## 2013-01-16 DIAGNOSIS — M25519 Pain in unspecified shoulder: Secondary | ICD-10-CM

## 2013-01-16 DIAGNOSIS — M7501 Adhesive capsulitis of right shoulder: Secondary | ICD-10-CM

## 2013-01-16 DIAGNOSIS — I10 Essential (primary) hypertension: Secondary | ICD-10-CM

## 2013-01-16 MED ORDER — MELOXICAM 7.5 MG PO TABS: 7.5000 mg | ORAL_TABLET | Freq: Every day | ORAL | Status: DC

## 2013-01-16 NOTE — Progress Notes (Signed)
  Subjective:    Patient ID: Lori [name], female    DOB: 1926-02-24, 79 y.o.   MRN: [id number]  HPI  79 y.o. WF presents to the office with multiple complaints:  Right shoulder pain x 1 week.  Had rotator cuff operated on several years ago.  Has not had a problem with it until recently.  Exacerbated by movement.  Has tried aleve at home with little relief.     Overactive Bladder - Feels the need to urinate and if she can't get there quickly she has incontinence.  Does not want to take any medications for this as she gets dry mouth.  Patient has discontinued many of her prescription medicines and says she has never felt better.        Review of Systems  Genitourinary: Positive for urgency.  Musculoskeletal: Positive for arthralgias.  All other systems reviewed and are negative.       Objective:   Physical Exam  Constitutional: She is oriented to person, place, and time. She appears well-developed and well-nourished. No distress.  Cardiovascular: Normal rate, regular rhythm and normal heart sounds.   Pulmonary/Chest: Effort normal and breath sounds normal.  Musculoskeletal:       Right shoulder: She exhibits decreased range of motion, pain and decreased strength. She exhibits no tenderness and no swelling.  Right shoulder:  No swelling, erythema, or effusion noted on inspection.  No tenderness to palpation.   Active range of motion limited due to pain.  Passive range of motion limited due to pain.  4/5 forward flexion, 4/5 abduction.          Neurological: She is alert and oriented to person, place, and time.  Skin: Skin is warm and dry.  Psychiatric: She has a normal mood and affect. Her behavior is normal.          Assessment & Plan:  79 y.o. WF presents to the office with multiple complaints  Right shoulder pain:  Physical exam is consistent with adhesive capsulitis.  We will start Meloxicam 7.5 mg QD and will refer to physical therapy.  Overactive bladder:  Patient has  refused medication at this time due to fear of dry mouth side effect.  Patient was counseled on behavioral modifications.  Medication changes:  Patients medication lists were updated.  Patient is to monitor her blood pressure as she has stopped taking this medicine and is to call if she notices a systolic pressure over 150, and diastolic pressure over 90.    Patient to follow up with Dr. Arthur Holms as scheduled.    I have personally reviewed this case with PA student. I also personally examined this patient. I agree with history and findings as documented above. I reviewed, discussed and approve of the assessment and plan as listed above. Rene Paci, MD
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
## 2013-01-16 NOTE — Assessment & Plan Note (Signed)
BP Readings from Last 3 Encounters:  01/16/13 120/82  12/18/12 160/78  11/12/12 118/80   Patient reports self discontinuation of all medications Today blood pressure well controlled,  encouraged to observe blood pressure  off treatment and call if systolic pressure greater than 140

## 2013-01-16 NOTE — Patient Instructions (Signed)
It was good to see you today. We have reviewed your prior records including labs and tests today Will refer to physical therapy by therapist at [facility] for your right shoulder pain Takes meloxicam once daily for right shoulder pain for 30 days, then as needed - Your prescription(s) have been submitted to your pharmacy. Please take as directed and contact our office if you believe you are having problem(s) with the medication(s). If shoulder pain symptoms unimproved in next 2 weeks with medications and physical therapy, call for steroid injection or referral to orthopedics as needed

## 2013-01-21 DIAGNOSIS — R0989 Other specified symptoms and signs involving the circulatory and respiratory systems: Secondary | ICD-10-CM

## 2013-01-23 ENCOUNTER — Encounter (INDEPENDENT_AMBULATORY_CARE_PROVIDER_SITE_OTHER): Payer: Medicare Other

## 2013-01-23 DIAGNOSIS — I6529 Occlusion and stenosis of unspecified carotid artery: Secondary | ICD-10-CM

## 2013-01-23 DIAGNOSIS — I6523 Occlusion and stenosis of bilateral carotid arteries: Secondary | ICD-10-CM

## 2013-01-31 ENCOUNTER — Other Ambulatory Visit: Payer: Self-pay

## 2013-01-31 MED ORDER — ZOLPIDEM TARTRATE 10 MG PO TABS: 10.0000 mg | ORAL_TABLET | Freq: Every evening | ORAL | Status: DC | PRN

## 2013-02-13 ENCOUNTER — Ambulatory Visit (INDEPENDENT_AMBULATORY_CARE_PROVIDER_SITE_OTHER): Payer: Medicare Other | Admitting: Internal Medicine

## 2013-02-13 ENCOUNTER — Encounter: Payer: Self-pay | Admitting: Internal Medicine

## 2013-02-13 VITALS — BP 148/88 | HR 80 | Temp 96.6°F | Ht 65.0 in | Wt 138.8 lb

## 2013-02-13 DIAGNOSIS — F411 Generalized anxiety disorder: Secondary | ICD-10-CM

## 2013-02-13 DIAGNOSIS — I519 Heart disease, unspecified: Secondary | ICD-10-CM

## 2013-02-13 DIAGNOSIS — R002 Palpitations: Secondary | ICD-10-CM

## 2013-02-13 DIAGNOSIS — I10 Essential (primary) hypertension: Secondary | ICD-10-CM

## 2013-02-13 DIAGNOSIS — R55 Syncope and collapse: Secondary | ICD-10-CM

## 2013-02-13 DIAGNOSIS — E785 Hyperlipidemia, unspecified: Secondary | ICD-10-CM

## 2013-02-13 DIAGNOSIS — J45909 Unspecified asthma, uncomplicated: Secondary | ICD-10-CM

## 2013-02-13 MED ORDER — DICYCLOMINE HCL 10 MG PO CAPS: 10.0000 mg | ORAL_CAPSULE | Freq: Three times a day (TID) | ORAL | Status: DC | PRN

## 2013-02-13 MED ORDER — ALPRAZOLAM 0.5 MG PO TBDP: ORAL_TABLET | ORAL | Status: DC

## 2013-02-13 MED ORDER — ZOLPIDEM TARTRATE 10 MG PO TABS: 10.0000 mg | ORAL_TABLET | Freq: Every evening | ORAL | Status: DC | PRN

## 2013-02-13 MED ORDER — LEVALBUTEROL TARTRATE 45 MCG/ACT IN AERO: 1.0000 | INHALATION_SPRAY | RESPIRATORY_TRACT | Status: DC | PRN

## 2013-02-13 MED ORDER — BUDESONIDE-FORMOTEROL FUMARATE 160-4.5 MCG/ACT IN AERO: 2.0000 | INHALATION_SPRAY | Freq: Two times a day (BID) | RESPIRATORY_TRACT | Status: DC

## 2013-02-13 MED ORDER — NITROFURANTOIN MONOHYD MACRO 100 MG PO CAPS: 100.0000 mg | ORAL_CAPSULE | Freq: Two times a day (BID) | ORAL | Status: DC

## 2013-02-13 MED ORDER — MELOXICAM 7.5 MG PO TABS: 7.5000 mg | ORAL_TABLET | Freq: Every day | ORAL | Status: DC

## 2013-02-13 NOTE — Progress Notes (Signed)
Subjective:    Patient ID: Lori [name], female    DOB: [birth year], 79 y.o.   MRN: [id number]  HPI Lori [name] presents for follow up. In the interval se has had a UTI. Reviewed Dr. Kathlyn [place] note and she is to avoid flouroquinones if possible due to QT prolongation. Reviewed last lab from Jan - GFR 84 ml/min. Macrobid will be the drug of choice for UTI. She should also drink cranberry juice or eat cranberries.  She has continues to have respiratory problems. Gets good relief with symbicort twice a day. Xopenex for breakthrough.   She needs Rx to take with her to [facility].   Past Medical History  Diagnosis Date  . COPD (chronic obstructive pulmonary disease)   . Asthma   . ALLERGIC RHINITIS   . Hypertension   . Pleural effusion, left   . Colles' fracture of left radius   . Anxiety   . Back pain of thoracolumbar region     right  . Pruritus   . Diastolic dysfunction   . Pulmonary nodule   . IBS (irritable bowel syndrome)   . Bronchiectasis   . Insomnia   . History of pneumonia   . Diverticulosis   . Hx of colonic polyp   . Multiple rib fractures 10/2008    bilateral  . Hx of cardiovascular stress test     a. ETT-MV 1/14: Exercised 4:16, no ECG changes, poor ex tol, ant defect c/w soft tissue atten, no ischemia, EF 75%   Past Surgical History  Procedure Laterality Date  . Tonsillectomy    . Appendectomy    . Cholecystectomy    . Polypectomy    . Bladder suspension  2005    A-P  . Rotator cuff repair  2006    right  . Orif wrist fracture  2010    left wrist.  Dr. Mina Marble.  . Total knee arthroplasty  08/2010    right  . Tonsillectomy    . Colonoscopy  04/06/2012    Procedure: COLONOSCOPY;  Surgeon: Beverley Fiedler, MD;  Location: [facility];  Service: Gastroenterology;  Laterality: N/A;   Family History  Problem Relation Age of Onset  . Heart disease Mother   . Colon cancer Neg Hx   . Bipolar disorder Daughter   . Rheumatic fever Mother   . Pneumonia Father     History   Social History  . Marital Status: Married    Spouse Name: N/A    Number of Children: 3  . Years of Education: N/A   Occupational History  . retired    Social History Main Topics  . Smoking status: Former Smoker -- 0.30 packs/day for 10 years    Types: Cigarettes    Quit date: 09/19/1948  . Smokeless tobacco: Never Used  . Alcohol Use: Yes     Comment: rarely  . Drug Use: No  . Sexually Active: No   Other Topics Concern  . Not on file   Social History Narrative   College - [employer]; [place] - [place]. Married 1959 - spouse s/p valve surgery with resulting paralysis hemidiaphragm ('10). 3 daughter, 5 grandsons, 4 granddaughters.  1 daughter bipolar - social issues. She remains very active, but can't play tennis. Pt was apprently ex-smoker, but husband does not recollect pt ever smoking. Stressful move to smaller quarters (fall '09) and now moving to [place] (fall '12).      ACP - she clearly states No CPR, no  mechanical ventilation and that quality of life is of key importance. Provided MOST sample, directed to [email or web address] and provided an Out of Facility Order. (Jan '14)    Current Outpatient Prescriptions on File Prior to Visit  Medication Sig Dispense Refill  . aspirin EC 81 MG tablet Take 81 mg by mouth as needed.       . bifidobacterium infantis (ALIGN) capsule Take 1 capsule by mouth daily.  90 capsule  3  . bimatoprost (LUMIGAN) 0.01 % SOLN Place 1 drop into both eyes daily.  5 mL  3  . calcium-vitamin D (OSCAL WITH D) 500-200 MG-UNIT per tablet Take 1 tablet by mouth as needed.       . Loperamide HCl 1 MG/7.5ML LIQD as needed. Take by mouth as directed      . Multiple Vitamin (MULTIVITAMIN) tablet Take by mouth daily.         No current facility-administered medications on file prior to visit.      Review of Systems System review is negative for any constitutional, cardiac, pulmonary, GI or neuro symptoms or complaints other than  as described in the HPI.     Objective:   Physical Exam Filed Vitals:   02/13/13 1531  BP: 148/88  Pulse: 80  Temp: 96.6 F (35.9 C)   Filed Weights   02/13/13 1531  Weight: 138 lb 12.8 oz (62.959 kg)   Gen'l WNWD white woman in no distress HEENT- C&S clear Cor - RRR Pulm - CTAP Neuro - A&O x 3, CN II-XII grossly intact, normal gait and station         Assessment & Plan:

## 2013-02-17 NOTE — Assessment & Plan Note (Signed)
Stable with no signs or symptoms of recurrent diastolic heart failure.  Plan Continue present medications

## 2013-02-17 NOTE — Assessment & Plan Note (Signed)
Appears to be stable and doing well. She has appropriate concerns about her husband's health.  Plan Continue present medical regimen - alprazaolam as needed.

## 2013-02-17 NOTE — Assessment & Plan Note (Signed)
Cardiology evaluation for syncope and palpiations reviewed. She does have borderlie QT prolongation and has been advised to avoid quinolones as potential exacerbating drugs.

## 2013-02-17 NOTE — Assessment & Plan Note (Signed)
No recurrent problems.

## 2013-02-17 NOTE — Assessment & Plan Note (Signed)
Lab Results  Component Value Date   CHOL 252* 01/16/2012   HDL 74.00 01/16/2012   LDLDIRECT 157.6 01/16/2012   TRIG 157.0* 01/16/2012   CHOLHDL 3 01/16/2012   Plan Lipid panel at next lab draw with recommendations to follow  Prudent diet

## 2013-02-17 NOTE — Assessment & Plan Note (Signed)
BP Readings from Last 3 Encounters:  02/13/13 148/88  01/16/13 120/82  12/18/12 160/78   Adequate blood pressure readings - in line with new geriatric recommendations re: goal for BP control

## 2013-02-17 NOTE — Assessment & Plan Note (Signed)
Stable at todays's exam. She does follow with Dr. Marchelle Gearing.  PLan Continue present medical therapy  Call for problems

## 2013-02-28 ENCOUNTER — Other Ambulatory Visit: Payer: Self-pay | Admitting: Internal Medicine

## 2013-02-28 NOTE — Telephone Encounter (Signed)
Need to change Rx to 5 mg, but can refill that with 5 refills.

## 2013-03-01 ENCOUNTER — Telehealth: Payer: Self-pay

## 2013-03-01 ENCOUNTER — Telehealth: Payer: Self-pay | Admitting: Internal Medicine

## 2013-03-01 MED ORDER — ZOLPIDEM TARTRATE 10 MG PO TABS: 10.0000 mg | ORAL_TABLET | Freq: Every evening | ORAL | Status: DC | PRN
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
## 2013-03-01 NOTE — Telephone Encounter (Signed)
Patient husband answered the phone and stated rx should be called to [facility]. This was done.

## 2013-03-01 NOTE — Telephone Encounter (Signed)
Maryanne with [facility] called back and stated patient normally has been getting 10 mg of Ambien not 5 mg. I let her know it was probably a typo and that yes patient normally does get 10 mg.

## 2013-03-01 NOTE — Telephone Encounter (Signed)
Ok for [employer] 5 mg # 30 with 5 refills

## 2013-03-04 ENCOUNTER — Telehealth: Payer: Self-pay

## 2013-03-04 NOTE — Telephone Encounter (Signed)
Phone call from [facility] wanting to verify if patient should be on alprazolam 0.5 mg dissolvable or regular 0.5 mg tablets?

## 2013-03-04 NOTE — Telephone Encounter (Signed)
Regular tablets

## 2013-03-05 MED ORDER — ZOLPIDEM TARTRATE 5 MG PO TABS: 5.0000 mg | ORAL_TABLET | Freq: Every evening | ORAL | Status: DC | PRN

## 2013-03-05 MED ORDER — ALPRAZOLAM 0.5 MG PO TABS: 0.5000 mg | ORAL_TABLET | Freq: Every evening | ORAL | Status: DC | PRN

## 2013-03-05 NOTE — Telephone Encounter (Signed)
Pharmacy notified   Also pharmacy notified that Ambien should be 5 mg not 10 mg. Per Dr Debby Bud; these changes have been made on med list

## 2013-04-23 ENCOUNTER — Ambulatory Visit: Payer: Medicare Other | Admitting: Internal Medicine

## 2013-04-24 ENCOUNTER — Ambulatory Visit (INDEPENDENT_AMBULATORY_CARE_PROVIDER_SITE_OTHER): Payer: Medicare Other | Admitting: Pulmonary Disease

## 2013-04-24 ENCOUNTER — Encounter: Payer: Self-pay | Admitting: Pulmonary Disease

## 2013-04-24 VITALS — BP 118/78 | HR 78 | Temp 97.7°F | Ht 65.0 in | Wt 140.0 lb

## 2013-04-24 DIAGNOSIS — J449 Chronic obstructive pulmonary disease, unspecified: Secondary | ICD-10-CM

## 2013-04-24 DIAGNOSIS — J441 Chronic obstructive pulmonary disease with (acute) exacerbation: Secondary | ICD-10-CM

## 2013-04-24 MED ORDER — LEVALBUTEROL TARTRATE 45 MCG/ACT IN AERO: 1.0000 | INHALATION_SPRAY | RESPIRATORY_TRACT | Status: DC | PRN

## 2013-04-24 NOTE — Patient Instructions (Addendum)
Use symbicort two puffs twice per day Xopenex two puffs up to four times per day as needed for cough, wheeze, or shortness of breath Will arrange for overnight oximetry  Follow up in 1 month

## 2013-04-24 NOTE — Assessment & Plan Note (Signed)
She has progression of her disease.  She did not find benefit from prior use of spiriva >> will defer rechallenge for now.  Advised her to use symbicort two puffs twice per day on a consistent basis.  Also will refill her xopenex.    She did not have oxygen desaturation with ambulation today.  Will arrange for room air overnight oximetry to further assess.  She may need repeat oximetry while at elevation in [facility] to determine if she needs oxygen therapy when staying there.

## 2013-04-24 NOTE — Progress Notes (Signed)
Chief Complaint  Patient presents with  . Follow-up    Pt c/o increased SOB and wheezing x 2-3 months. [place] more diffcult to breath.     History of Present Illness: Lori [name] is a 79 y.o. female with COPD.  She is followed by Dr. Marchelle Gearing.  She is here for an acute visit.  She has noticed more trouble with her breathing for the past few months.  She lives in [place] during the summer.  She notices her breathing is heavy when she is at altitude.  It get better when she returns to [place], but does not resolve completely.  She notices more trouble with rainy weather and hot weather.  She is getting a cough and wheeze, but not much sputum.  She denies chest pain or fever.  She is not having leg swelling.  Her husband has oxygen and she will sometimes use this >> this helps.  She has symbicort, but only uses this once per day.  She does not have any other inhalers.  She tried spiriva before, but this didn't help.  Lori [name]  has a past medical history of COPD (chronic obstructive pulmonary disease); Asthma; ALLERGIC RHINITIS; Hypertension; Pleural effusion, left; Colles' fracture of left radius; Anxiety; Back pain of thoracolumbar region; Pruritus; Diastolic dysfunction; Pulmonary nodule; IBS (irritable bowel syndrome); Bronchiectasis; Insomnia; History of pneumonia; Diverticulosis; colonic polyp; Multiple rib fractures (10/2008); and cardiovascular stress test.  Lori [name]  has past surgical history that includes Tonsillectomy; Appendectomy; Cholecystectomy; Polypectomy; Bladder suspension (2005); Rotator cuff repair (2006); ORIF wrist fracture (2010); Total knee arthroplasty (08/2010); Tonsillectomy; and Colonoscopy (04/06/2012).  Prior to Admission medications   Medication Sig Start Date End Date Taking? Authorizing Provider  ALPRAZolam Prudy Feeler) 0.5 MG tablet Take 1 tablet (0.5 mg total) by mouth at bedtime  as needed for sleep. 03/05/13  Yes [name], MD  aspirin EC 81 MG tablet Take 81 mg by mouth as needed.  07/06/11  Yes Laurey Morale, MD  bifidobacterium infantis (ALIGN) capsule Take 1 capsule by mouth daily. 06/20/12 06/20/13 Yes Rosalyn Gess Norins, MD  bimatoprost (LUMIGAN) 0.01 % SOLN Place 1 drop into both eyes daily. 06/20/12  Yes [name], MD  budesonide-formoterol (SYMBICORT) 160-4.5 MCG/ACT inhaler Inhale 2 puffs into the lungs 2 (two) times daily. 02/13/13 02/13/14 Yes [name], MD  calcium-vitamin D (OSCAL WITH D) 500-200 MG-UNIT per tablet Take 1 tablet by mouth as needed.    Yes Historical Provider, MD  dicyclomine (BENTYL) 10 MG capsule Take 1 capsule (10 mg total) by mouth 3 (three) times daily as needed. IBS 02/13/13  Yes [name], MD  levalbuterol [facility] HFA) 45 MCG/ACT inhaler Inhale 1-2 puffs into the lungs every 4 (four) hours as needed for wheezing. 02/13/13  Yes [name], MD  Loperamide HCl 1 MG/7.5ML LIQD as needed. Take by mouth as directed 05/15/12  Yes Beverley Fiedler, MD  meloxicam (MOBIC) 7.5 MG tablet Take 1 tablet (7.5 mg total) by mouth daily. 02/13/13  Yes [name], MD  Multiple Vitamin (MULTIVITAMIN) tablet Take by mouth daily.     Yes Historical Provider, MD  nitrofurantoin, macrocrystal-monohydrate, (MACROBID) 100 MG capsule Take 1 capsule (100 mg total) by mouth 2 (two) times daily. X 7 days 02/13/13  Yes [name], MD  zolpidem (AMBIEN) 5 MG tablet Take 1 tablet (5 mg  total) by mouth at bedtime as needed for sleep. 03/05/13  Yes [name], MD    Allergies  Allergen Reactions  . Albuterol Sulfate Palpitations  . Amoxicillin     REACTION: unspecified  . Doxycycline Diarrhea  . Levaquin (Levofloxacin In D5w) Other (See Comments)    QT prolongation. Should avoid all flouroquinolones.   . Penicillins     REACTION: causes hives  . Sulfonamide Derivatives Nausea Only     Physical Exam:  General - No  distress, thin ENT - No sinus tenderness, no oral exudate, no LAN Cardiac - s1s2 regular, no murmur Chest - scattered rhonchi, no wheeze Back - No focal tenderness Abd - Soft, non-tender Ext - No edema Neuro - Normal strength Skin - No rashes Psych - normal mood, and behavior  Ambulatory oximetry >> no desaturation  Spirometry >> FEV1 0.76 (40%), FEV1% 58   Assessment/Plan:  Coralyn Helling, MD  Pulmonary/Critical Care/Sleep Pager:  [phone]

## 2013-04-26 ENCOUNTER — Other Ambulatory Visit: Payer: Self-pay | Admitting: Dermatology

## 2013-04-29 ENCOUNTER — Telehealth: Payer: Self-pay | Admitting: Internal Medicine

## 2013-04-29 NOTE — Telephone Encounter (Signed)
Will forward to MR as FYI 

## 2013-05-14 ENCOUNTER — Telehealth: Payer: Self-pay | Admitting: Internal Medicine

## 2013-05-14 ENCOUNTER — Telehealth: Payer: Self-pay | Admitting: *Deleted

## 2013-05-14 NOTE — Telephone Encounter (Signed)
Marcelino Duster from [facility] in [place], [place] called requesting Ambien refill.  Please advise

## 2013-05-14 NOTE — Telephone Encounter (Signed)
lmom for pt to call me back. The only EGD found was by Dr Jarold Motto on 11/27/2006; no mention of stricture dilation.

## 2013-05-14 NOTE — Telephone Encounter (Signed)
Ok for refill x2 

## 2013-05-15 ENCOUNTER — Other Ambulatory Visit: Payer: Self-pay | Admitting: *Deleted

## 2013-05-15 MED ORDER — ZOLPIDEM TARTRATE 5 MG PO TABS: 5.0000 mg | ORAL_TABLET | Freq: Every evening | ORAL | Status: DC | PRN

## 2013-05-21 NOTE — Telephone Encounter (Signed)
Pt nor husband ever called back.

## 2013-05-23 ENCOUNTER — Telehealth: Payer: Self-pay | Admitting: Internal Medicine

## 2013-05-23 NOTE — Telephone Encounter (Signed)
Sorry - for a woman of 87  5 mg is maximum safe dose.

## 2013-05-23 NOTE — Telephone Encounter (Signed)
I called and spoke to [facility] and was advised patient is requesting zolpidem 10 mg, not 5 mg. Please advise.

## 2013-05-23 NOTE — Telephone Encounter (Signed)
Please get in contact with Pharmacy in regards to why they are not filling her rx for ambien.  Patient has enough to last her through Saturday.  Thanks so much!!

## 2013-05-24 MED ORDER — ZOLPIDEM TARTRATE 5 MG PO TABS: 5.0000 mg | ORAL_TABLET | Freq: Every evening | ORAL | Status: DC | PRN

## 2013-05-24 NOTE — Telephone Encounter (Signed)
Prescription has been called to [facility]

## 2013-05-24 NOTE — Telephone Encounter (Signed)
Patient has been notified that 5 mg is the max dose safe for her age. She expressed understanding. She states this needs to be called to [facility] per 05/14/13 phone note. I just want to clarify is she to have #15 with 2 refills or #30 with 2 refills? Please advise. Thanks

## 2013-05-24 NOTE — Telephone Encounter (Signed)
30 with 2 refills. Thanks

## 2013-05-28 ENCOUNTER — Other Ambulatory Visit: Payer: Self-pay | Admitting: Dermatology

## 2013-06-12 ENCOUNTER — Telehealth: Payer: Self-pay | Admitting: Pulmonary Disease

## 2013-06-12 NOTE — Telephone Encounter (Signed)
ONO with RA 06/05/13 >> Test time 7 hrs 5 min.  Basal SpO2 93%, low SpO2 82%.  Spent 15.8 min with SpO2 < 89%.  Pt is followed by Dr. Marchelle Gearing.  Will forward results to Dr. Marchelle Gearing to address need for nocturnal oxygen set up.

## 2013-06-12 NOTE — Telephone Encounter (Signed)
Lori [name]  She desaturates at night. Please let her know. Needs O2. See my other phone message. See if she can come into see me in oct 2014  Dr. Kalman Shan, M.D., [facility].C.P Pulmonary and Critical Care Medicine Staff Physician [facility] Pulmonary and Critical Care Pager: [phone], If no answer or between  15:00h - 7:00h: call [phone]  06/12/2013 4:36 PM

## 2013-06-12 NOTE — Telephone Encounter (Signed)
Lori [name]  Please call patient. She has stated to Dr Craige Cotta that she can never get in with me and she calls and never gets connected to when she calls triage ( no evidence of calls she has made). Ask her ifshe wants to see me in OCt 2014 in one of my  New open schedules  Thanks  Dr. Kalman Shan, M.D., [facility].C.P Pulmonary and Critical Care Medicine Staff Physician [facility]  Pulmonary and Critical Care Pager: [phone], If no answer or between  15:00h - 7:00h: call [phone]  06/12/2013 8:33 AM

## 2013-06-13 ENCOUNTER — Telehealth: Payer: Self-pay | Admitting: Internal Medicine

## 2013-06-13 NOTE — Telephone Encounter (Signed)
Reviewed ONO results again. TRace now available to me. BEfore it was based on Dr Craige Cotta phone message. Total tiime < 88% is 0.82min with total time  < 89% at 15.25min. Lowest pulse ox 82%. This borderline forr nocturnal o2 need. Will have to discuss with her face to face   PNOdone 06/05/13. So get her in betfore 07/05/13  Dr. Kalman Shan, M.D., [facility].C.P Pulmonary and Critical Care Medicine Staff Physician [facility] Pulmonary and Critical Care Pager: [phone], If no answer or between  15:00h - 7:00h: call [phone]  06/13/2013 4:51 PM

## 2013-06-14 NOTE — Telephone Encounter (Signed)
There are 3 phone notes open on this pt for the same issue. I will close this note and continue in one note. Carron Curie, CMA

## 2013-06-14 NOTE — Telephone Encounter (Signed)
[email or web address].Jennifer Castillo, CMA  

## 2013-06-18 NOTE — Telephone Encounter (Signed)
I spoke with the pt and advised of results an appt set for 07/03/13, order will need to be placed for oxygen at the appt so ONO will not have to be repeated. Carron Curie, CMA

## 2013-07-01 ENCOUNTER — Ambulatory Visit (INDEPENDENT_AMBULATORY_CARE_PROVIDER_SITE_OTHER): Payer: Medicare Other

## 2013-07-01 DIAGNOSIS — Z23 Encounter for immunization: Secondary | ICD-10-CM

## 2013-07-03 ENCOUNTER — Ambulatory Visit (INDEPENDENT_AMBULATORY_CARE_PROVIDER_SITE_OTHER): Payer: Medicare Other | Admitting: Internal Medicine

## 2013-07-03 ENCOUNTER — Encounter: Payer: Self-pay | Admitting: Internal Medicine

## 2013-07-03 VITALS — BP 138/76 | HR 77 | Temp 97.8°F | Ht 65.0 in | Wt 137.8 lb

## 2013-07-03 DIAGNOSIS — J441 Chronic obstructive pulmonary disease with (acute) exacerbation: Secondary | ICD-10-CM

## 2013-07-03 DIAGNOSIS — R0609 Other forms of dyspnea: Secondary | ICD-10-CM

## 2013-07-03 MED ORDER — ACLIDINIUM BROMIDE 400 MCG/ACT IN AEPB: 1.0000 | INHALATION_SPRAY | Freq: Two times a day (BID) | RESPIRATORY_TRACT | Status: DC

## 2013-07-03 MED ORDER — FLUTICASONE FUROATE-VILANTEROL 100-25 MCG/INH IN AEPB: 1.0000 | INHALATION_SPRAY | Freq: Every day | RESPIRATORY_TRACT | Status: DC

## 2013-07-03 NOTE — Patient Instructions (Addendum)
Take tudorza 1 puff twice daily Take BREO 1 puff daily I will see what is going in [place] Regarding Lori [name] followup FOllowup  - REturn to see my NP Tammy in  3 weeks  - Spirometry and CAT score at followup to compare - definitely needed  - If unimproved test for RA, with ANA, RF and CCP
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
## 2013-07-04 NOTE — Assessment & Plan Note (Signed)
She has obstrucitive lung disease that is makinng her dyspneic. Unclear if this is fixed asthma or post pneumonia BO. She denies Rheumatoid arthrotis to account fo obstruction.  PLAN Will first give good trial of MDI; she has agrred to be compliant this time Start tudorza and breo; technique taught FU with CAT score and spirometry IF n no improvenent. Test ANA, RF and CCP for Rheumatoid ARthritis If no improvement, consider rehab v [facility] referral   She is agreeable with above plan  > 50% of this > 25 min visit spent in face to face counseling (15 min visit converted to 25 min)

## 2013-07-24 ENCOUNTER — Ambulatory Visit: Payer: Medicare Other | Admitting: Adult Health

## 2013-08-20 ENCOUNTER — Ambulatory Visit (INDEPENDENT_AMBULATORY_CARE_PROVIDER_SITE_OTHER): Payer: Medicare Other | Admitting: Internal Medicine

## 2013-08-20 ENCOUNTER — Encounter: Payer: Self-pay | Admitting: Internal Medicine

## 2013-08-20 ENCOUNTER — Other Ambulatory Visit (INDEPENDENT_AMBULATORY_CARE_PROVIDER_SITE_OTHER): Payer: Medicare Other

## 2013-08-20 VITALS — BP 140/80 | HR 86 | Temp 96.9°F | Ht 65.0 in | Wt 141.6 lb

## 2013-08-20 DIAGNOSIS — K449 Diaphragmatic hernia without obstruction or gangrene: Secondary | ICD-10-CM

## 2013-08-20 DIAGNOSIS — R279 Unspecified lack of coordination: Secondary | ICD-10-CM

## 2013-08-20 DIAGNOSIS — Z0000 Encounter for general adult medical examination without abnormal findings: Secondary | ICD-10-CM

## 2013-08-20 DIAGNOSIS — M810 Age-related osteoporosis without current pathological fracture: Secondary | ICD-10-CM

## 2013-08-20 DIAGNOSIS — J449 Chronic obstructive pulmonary disease, unspecified: Secondary | ICD-10-CM

## 2013-08-20 DIAGNOSIS — I6529 Occlusion and stenosis of unspecified carotid artery: Secondary | ICD-10-CM

## 2013-08-20 DIAGNOSIS — M8580 Other specified disorders of bone density and structure, unspecified site: Secondary | ICD-10-CM

## 2013-08-20 DIAGNOSIS — I10 Essential (primary) hypertension: Secondary | ICD-10-CM

## 2013-08-20 DIAGNOSIS — Z23 Encounter for immunization: Secondary | ICD-10-CM

## 2013-08-20 DIAGNOSIS — E7800 Pure hypercholesterolemia, unspecified: Secondary | ICD-10-CM

## 2013-08-20 DIAGNOSIS — M899 Disorder of bone, unspecified: Secondary | ICD-10-CM

## 2013-08-20 DIAGNOSIS — N39498 Other specified urinary incontinence: Secondary | ICD-10-CM

## 2013-08-20 LAB — CBC WITH DIFFERENTIAL/PLATELET
Basophils Relative: 0.5 % (ref 0.0–3.0)
Eosinophils Absolute: 0.1 10*3/uL (ref 0.0–0.7)
Eosinophils Relative: 2 % (ref 0.0–5.0)
Hemoglobin: 14.1 g/dL (ref 12.0–15.0)
Lymphocytes Relative: 23.5 % (ref 12.0–46.0)
MCHC: 33.9 g/dL (ref 30.0–36.0)
MCV: 85.8 fl (ref 78.0–100.0)
Monocytes Absolute: 0.4 10*3/uL (ref 0.1–1.0)
Monocytes Relative: 5.7 % (ref 3.0–12.0)
Neutro Abs: 4.3 10*3/uL (ref 1.4–7.7)
Platelets: 290 10*3/uL (ref 150.0–400.0)
RBC: 4.86 Mil/uL (ref 3.87–5.11)

## 2013-08-20 LAB — MAGNESIUM: Magnesium: 2 mg/dL (ref 1.5–2.5)

## 2013-08-20 LAB — COMPREHENSIVE METABOLIC PANEL
AST: 19 U/L (ref 0–37)
Albumin: 4 g/dL (ref 3.5–5.2)
Alkaline Phosphatase: 81 U/L (ref 39–117)
BUN: 18 mg/dL (ref 6–23)
Creatinine, Ser: 0.6 mg/dL (ref 0.4–1.2)
GFR: 109.24 mL/min (ref >60.00)
Sodium: 140 mEq/L (ref 135–145)

## 2013-08-20 NOTE — Progress Notes (Signed)
Pre visit review using our clinic review tool, if applicable. No additional management support is needed unless otherwise documented below in the visit note. 

## 2013-08-20 NOTE — Progress Notes (Signed)
Subjective:    Patient ID: Lori [name], female    DOB: 1926/03/13, 79 y.o.   MRN: [id number]  HPI The patient is here for annual Medicare wellness examination and management of other chronic and acute problems.   She is doing well but she is having progressive respiratory trouble and is considering going to [facility] center in [place].   The risk factors are reflected in the social history.  The roster of all physicians providing medical care to patient - is listed in the Snapshot section of the chart.  Activities of daily living:  The patient is 100% inedpendent in all ADLs: dressing, toileting, feeding as well as independent mobility  Home safety : The patient has smoke detectors in the home. They wear seatbelts. No firearms at home. There is no violence in the home.   There is no risks for hepatitis, STDs or HIV. There is no   history of blood transfusion. They have no travel history to infectious disease endemic areas of the world.  The patient has seen their dentist in the last six month. They have seen their eye doctor in the last year. They deny any hearing difficulty and have not had audiologic testing in the last year.    They do not  have excessive sun exposure. Discussed the need for sun protection: hats, long sleeves and use of sunscreen if there is significant sun exposure.   Diet: the importance of a healthy diet is discussed. They do have a healthy diet.  The patient has a regular exercise program: swims 40 laps and exercises , 1 hr duration, 3 per week.  The benefits of regular aerobic exercise were discussed.  Depression screen: there are no signs or vegative symptoms of depression- irritability, change in appetite, anhedonia, sadness/tearfullness.  Cognitive assessment: the patient manages all their financial and personal affairs and is actively engaged.   The following portions of the patient's history were reviewed and updated as appropriate:  allergies, current medications, past family history, past medical history,  past surgical history, past social history  and problem list.  Vision, hearing, body mass index were assessed and reviewed.   During the course of the visit the patient was educated and counseled about appropriate screening and preventive services including : fall prevention , diabetes screening, nutrition counseling, colorectal cancer screening, and recommended immunizations.  Past Medical History  Diagnosis Date  . COPD (chronic obstructive pulmonary disease)   . Asthma   . ALLERGIC RHINITIS   . Hypertension   . Pleural effusion, left   . Colles' fracture of left radius   . Anxiety   . Back pain of thoracolumbar region     right  . Pruritus   . Diastolic dysfunction   . Pulmonary nodule   . IBS (irritable bowel syndrome)   . Bronchiectasis   . Insomnia   . History of pneumonia   . Diverticulosis   . Hx of colonic polyp   . Multiple rib fractures 10/2008    bilateral  . Hx of cardiovascular stress test     a. ETT-MV 1/14: Exercised 4:16, no ECG changes, poor ex tol, ant defect c/w soft tissue atten, no ischemia, EF 75%   Past Surgical History  Procedure Laterality Date  . Tonsillectomy    . Appendectomy    . Cholecystectomy    . Polypectomy    . Bladder suspension  2005    A-P  . Rotator cuff repair  2006  right  . Orif wrist fracture  2010    left wrist.  Dr. Mina Marble.  . Total knee arthroplasty  08/2010    right  . Tonsillectomy    . Colonoscopy  04/06/2012    Procedure: COLONOSCOPY;  Surgeon: Beverley Fiedler, MD;  Location: [facility];  Service: Gastroenterology;  Laterality: N/A;   Family History  Problem Relation Age of Onset  . Heart disease Mother   . Colon cancer Neg Hx   . Bipolar disorder Daughter   . Rheumatic fever Mother   . Pneumonia Father    History   Social History  . Marital Status: Married    Spouse Name: N/A    Number of Children: 3  . Years of Education: N/A     Occupational History  . retired    Social History Main Topics  . Smoking status: Former Smoker -- 0.30 packs/day for 10 years    Types: Cigarettes    Quit date: 09/19/1948  . Smokeless tobacco: Never Used  . Alcohol Use: Yes     Comment: rarely  . Drug Use: No  . Sexual Activity: No   Other Topics Concern  . Not on file   Social History Narrative   College - [employer]; [place] - [place]. Married 1959 - spouse s/p valve surgery with resulting paralysis hemidiaphragm ('10). 3 daughter, 5 grandsons, 4 granddaughters.  1 daughter bipolar - social issues. She remains very active, but can't play tennis. Pt was apprently ex-smoker, but husband does not recollect pt ever smoking. Stressful move to smaller quarters (fall '09) and now moving to [facility] (fall '12).      ACP - she clearly states No CPR, no mechanical ventilation and that quality of life is of key importance. Provided MOST sample, directed to [email or web address] and provided an Out of Facility Order. (Jan '14)     Current Outpatient Prescriptions on File Prior to Visit  Medication Sig Dispense Refill  . Aclidinium Bromide (TUDORZA PRESSAIR) 400 MCG/ACT AEPB Inhale 1 puff into the lungs 2 (two) times daily.  2 each  0  . aspirin EC 81 MG tablet Take 81 mg by mouth as needed.       . bimatoprost (LUMIGAN) 0.01 % SOLN Place 1 drop into both eyes daily.  5 mL  3  . budesonide-formoterol (SYMBICORT) 160-4.5 MCG/ACT inhaler Inhale 2 puffs into the lungs 2 (two) times daily.  1 Inhaler  3  . dicyclomine (BENTYL) 10 MG capsule Take 1 capsule (10 mg total) by mouth 3 (three) times daily as needed. IBS  90 capsule  0  . Fluticasone Furoate-Vilanterol (BREO ELLIPTA) 100-25 MCG/INH AEPB Inhale 1 puff into the lungs daily.  2 each  0  . levalbuterol (XOPENEX HFA) 45 MCG/ACT inhaler Inhale 1-2 puffs into the lungs every 4 (four) hours as needed for wheezing.  1 Inhaler  3  . Loperamide HCl 1 MG/7.5ML LIQD as needed. Take by  mouth as directed      . Multiple Vitamin (MULTIVITAMIN) tablet Take by mouth daily.        . nitrofurantoin, macrocrystal-monohydrate, (MACROBID) 100 MG capsule Take 1 capsule (100 mg total) by mouth 2 (two) times daily. X 7 days  14 capsule  0  . zolpidem (AMBIEN) 5 MG tablet Take 1 tablet (5 mg total) by mouth at bedtime as needed for sleep.  30 tablet  2  . bifidobacterium infantis (ALIGN) capsule Take 1 capsule by mouth daily.  90 capsule  3   No current facility-administered medications on file prior to visit.       Review of Systems Constitutional:  Negative for fever, chills, activity change and unexpected weight change.  HEENT:  Negative for hearing loss, ear pain, congestion, neck stiffness and postnasal drip. Negative for sore throat or swallowing problems. Negative for dental complaints.   Eyes: Negative for vision loss or change in visual acuity.  Respiratory: Positive for chest tightness and wheezing. Positive for DOE.   Cardiovascular: Negative for chest pain or palpitations. No decreased exercise tolerance Gastrointestinal: No change in bowel habit. No bloating or gas. No reflux or indigestion Genitourinary: Positive for urgency, frequency, negative flank pain and difficulty urinating.  Musculoskeletal: Negative for myalgias, back pain, arthralgias and gait problem.  Neurological: Negative for dizziness, tremors, weakness and headaches.  Hematological: Negative for adenopathy.  Psychiatric/Behavioral: Negative for behavioral problems and dysphoric mood.       Objective:   Physical Exam Filed Vitals:   08/20/13 0914  BP: 140/80  Pulse: 86  Temp: 96.9 F (36.1 C)   Wt Readings from Last 3 Encounters:  08/20/13 141 lb 9.6 oz (64.229 kg)  07/03/13 137 lb 12.8 oz (62.506 kg)  04/24/13 140 lb (63.504 kg)   BP Readings from Last 3 Encounters:  08/20/13 140/80  07/03/13 138/76  04/24/13 118/78   Gen'l- WNWD woman who appears fit and alert HEENT - C&S clear,  PERRLA Cor- 2+ radial pulse, RRR, no peripheral edema PUlm - no increased WOB at rest, normal breath sounds, no rales or wheezes Neuro - A&O x 3, cognition is normal  Reviewed prior lab '03: LDL = 157      Assessment & Plan:

## 2013-08-20 NOTE — Patient Instructions (Signed)
Your appreciation means a great deal to me. It has been my privilege to be your doctor.  If I can help in any way with a referral to [facility] let me know.  You seem to be doing well. We will do routine lab today plus vitamin D, B12 and magnesium. Results will be posted to MyChart  For Over Active Bladder try th Myrbetriq - it is much less drying than the other products.  We will immunize today with Prevnar 13 - a companion vaccine to Pneumovax; will also give you a tetanus shot.  Keep up the good work with physical culture - it will stand you in good stead.

## 2013-08-21 DIAGNOSIS — Z7189 Other specified counseling: Secondary | ICD-10-CM | POA: Insufficient documentation

## 2013-08-21 NOTE — Assessment & Plan Note (Signed)
Patient w/o carotid bruits. Last Carotid doppler May '14 w/o significant disease.  Plan Risk reduction.

## 2013-08-21 NOTE — Assessment & Plan Note (Signed)
She has seen Dr. Perley Jain. She has tried medications but stopped due to xerostomia and generalized dryness  Plan Trial of Myrbetriq 25 mg daily (#7 given).

## 2013-08-21 NOTE — Assessment & Plan Note (Signed)
No need for DEXA at this time.  Plan Calcium 1200 mg daily  Vit D 1,000 iu daily  Weight bearing exercise.

## 2013-08-21 NOTE — Assessment & Plan Note (Signed)
Patient reports that her breathing is getting worse. She is able to swim forty laps 3 times a week. She has expressed an interest in seeing Dr. Constance Goltz and [facility] in [place].  Plan Continue present medications- tudorza and Virgel Bouquet  Will help facilitate referral to [facility] in any way I can.

## 2013-08-21 NOTE — Assessment & Plan Note (Signed)
Medically stable with progressive COPD. Limited physical exam is normal. Labs were in normal range - at 85 will not pursue medical treatment of mildly elevated LDL of 157. She is current with colorectal and breast cancer screening.Immunizations are up to date. She is physically active swimming on a regular basis; mentally active - playing contract bridge and reading, etc. ACP has been discussed.  In summary  A charming woman who appears to be medically stable.

## 2013-08-23 LAB — VITAMIN D 1,25 DIHYDROXY
Vitamin D 1, 25 (OH)2 Total: 54 pg/mL (ref 18–72)
Vitamin D3 1, 25 (OH)2: 54 pg/mL

## 2013-08-30 ENCOUNTER — Other Ambulatory Visit: Payer: Self-pay | Admitting: Internal Medicine

## 2013-08-30 DIAGNOSIS — R0609 Other forms of dyspnea: Secondary | ICD-10-CM

## 2013-09-04 ENCOUNTER — Other Ambulatory Visit: Payer: Self-pay | Admitting: Internal Medicine

## 2013-11-18 ENCOUNTER — Other Ambulatory Visit: Payer: Self-pay | Admitting: Internal Medicine

## 2013-11-28 ENCOUNTER — Encounter: Payer: Self-pay | Admitting: Internal Medicine

## 2013-11-28 ENCOUNTER — Ambulatory Visit (INDEPENDENT_AMBULATORY_CARE_PROVIDER_SITE_OTHER): Payer: Medicare Other | Admitting: Internal Medicine

## 2013-11-28 VITALS — BP 140/78 | HR 68 | Temp 97.7°F | Wt 141.0 lb

## 2013-11-28 DIAGNOSIS — I10 Essential (primary) hypertension: Secondary | ICD-10-CM

## 2013-11-28 MED ORDER — BUDESONIDE-FORMOTEROL FUMARATE 160-4.5 MCG/ACT IN AERO: 2.0000 | INHALATION_SPRAY | Freq: Two times a day (BID) | RESPIRATORY_TRACT | Status: DC

## 2013-11-28 MED ORDER — VALSARTAN-HYDROCHLOROTHIAZIDE 160-12.5 MG PO TABS
ORAL_TABLET | ORAL | Status: DC
Start: 2013-11-28 — End: 2014-11-25

## 2013-11-28 MED ORDER — ZOLPIDEM TARTRATE 5 MG PO TABS: ORAL_TABLET | ORAL | Status: DC

## 2013-11-28 NOTE — Progress Notes (Signed)
Subjective:    Patient ID: Lori [name], female    DOB: Sep 21, 1925, 80 y.o.   MRN: [id number]  HPI Lori [name] presents for follow up. She had an exam at [facility] and she had high blood pressure. She is generally feeling well. Will be going to [facility] in [place] for pulmonary evaluation.  Past Medical History  Diagnosis Date  . COPD (chronic obstructive pulmonary disease)   . Asthma   . ALLERGIC RHINITIS   . Hypertension   . Pleural effusion, left   . Colles' fracture of left radius   . Anxiety   . Back pain of thoracolumbar region     right  . Pruritus   . Diastolic dysfunction   . Pulmonary nodule   . IBS (irritable bowel syndrome)   . Bronchiectasis   . Insomnia   . History of pneumonia   . Diverticulosis   . Hx of colonic polyp   . Multiple rib fractures 10/2008    bilateral  . Hx of cardiovascular stress test     a. ETT-MV 1/14: Exercised 4:16, no ECG changes, poor ex tol, ant defect c/w soft tissue atten, no ischemia, EF 75%   Past Surgical History  Procedure Laterality Date  . Tonsillectomy    . Appendectomy    . Cholecystectomy    . Polypectomy    . Bladder suspension  2005    A-P  . Rotator cuff repair  2006    right  . Orif wrist fracture  2010    left wrist.  Dr. Burney Gauze.  . Total knee arthroplasty  08/2010    right  . Tonsillectomy    . Colonoscopy  04/06/2012    Procedure: COLONOSCOPY;  Surgeon: Jerene Bears, MD;  Location: [facility];  Service: Gastroenterology;  Laterality: N/A;   Family History  Problem Relation Age of Onset  . Heart disease Mother   . Colon cancer Neg Hx   . Bipolar disorder Daughter   . Rheumatic fever Mother   . Pneumonia Father    History   Social History  . Marital Status: Married    Spouse Name: N/A    Number of Children: 3  . Years of Education: N/A   Occupational History  . retired    Social History Main Topics  . Smoking status: Former Smoker -- 0.30 packs/day for 10 years    Types:  Cigarettes    Quit date: 09/19/1948  . Smokeless tobacco: Never Used  . Alcohol Use: Yes     Comment: rarely  . Drug Use: No  . Sexual Activity: No   Other Topics Concern  . Not on file   Social History Narrative   [place]; [place]. Married 1959 - spouse s/p valve surgery with resulting paralysis hemidiaphragm ('10). 3 daughter, 5 grandsons, 4 granddaughters.  1 daughter bipolar - social issues. She remains very active, but can't play tennis. Pt was apprently ex-smoker, but husband does not recollect pt ever smoking. Stressful move to smaller quarters (fall '09) and now moving to [facility] (fall '12).      ACP - she clearly states No CPR, no mechanical ventilation and that quality of life is of key importance. Provided MOST sample, directed to [email or web address] and provided an Out of Facility Order. (Jan '14)     Current Outpatient Prescriptions on File Prior to Visit  Medication Sig Dispense Refill  . Aclidinium Bromide (TUDORZA PRESSAIR) 400 MCG/ACT AEPB Inhale  1 puff into the lungs 2 (two) times daily.  2 each  0  . aspirin EC 81 MG tablet Take 81 mg by mouth as needed.       . bimatoprost (LUMIGAN) 0.01 % SOLN Place 1 drop into both eyes daily.  5 mL  3  . dicyclomine (BENTYL) 10 MG capsule Take 1 capsule (10 mg total) by mouth 3 (three) times daily as needed. IBS  90 capsule  0  . Fluticasone Furoate-Vilanterol (BREO ELLIPTA) 100-25 MCG/INH AEPB Inhale 1 puff into the lungs daily.  2 each  0  . levalbuterol (XOPENEX HFA) 45 MCG/ACT inhaler Inhale 1-2 puffs into the lungs every 4 (four) hours as needed for wheezing.  1 Inhaler  3  . Loperamide HCl 1 MG/7.5ML LIQD as needed. Take by mouth as directed      . Multiple Vitamin (MULTIVITAMIN) tablet Take by mouth daily.        . nitrofurantoin, macrocrystal-monohydrate, (MACROBID) 100 MG capsule Take 1 capsule (100 mg total) by mouth 2 (two) times daily. X 7 days  14 capsule  0  . bifidobacterium  infantis (ALIGN) capsule Take 1 capsule by mouth daily.  90 capsule  3   No current facility-administered medications on file prior to visit.      Review of Systems System review is negative for any constitutional, cardiac, pulmonary, GI or neuro symptoms or complaints other than as described in the HPI.     Objective:   Physical Exam Filed Vitals:   11/28/13 1118  BP: 140/78  Pulse: 68  Temp: 97.7 F (36.5 C)   BP Readings from Last 3 Encounters:  11/28/13 140/78  08/20/13 140/80  07/03/13 138/76   Gen'l WNWD woman in no distress HEENT - [place]/AT, C&W clear Cor 2+ radial Pulm - normal respirations w/o increased WOB Neuro - A&O x 3        Assessment & Plan:

## 2013-11-28 NOTE — Progress Notes (Signed)
Pre visit review using our clinic review tool, if applicable. No additional management support is needed unless otherwise documented below in the visit note. 

## 2013-12-01 NOTE — Assessment & Plan Note (Signed)
BP Readings from Last 3 Encounters:  11/28/13 140/78  08/20/13 140/80  07/03/13 138/76   Adequate control of BP per JNC 8 guidelines  Plan Continue diovan/hct 160/25

## 2014-01-20 LAB — PULMONARY FUNCTION TEST

## 2014-01-22 ENCOUNTER — Encounter: Payer: Self-pay | Admitting: Cardiology

## 2014-01-22 LAB — PROTIME-INR: INR: 1 (ref 0.9–1.1)

## 2014-01-31 ENCOUNTER — Encounter: Payer: Self-pay | Admitting: Internal Medicine

## 2014-01-31 ENCOUNTER — Ambulatory Visit (INDEPENDENT_AMBULATORY_CARE_PROVIDER_SITE_OTHER): Payer: Medicare Other | Admitting: Internal Medicine

## 2014-01-31 VITALS — BP 180/94 | HR 80 | Temp 98.1°F | Resp 16 | Wt 139.0 lb

## 2014-01-31 DIAGNOSIS — J449 Chronic obstructive pulmonary disease, unspecified: Secondary | ICD-10-CM

## 2014-01-31 DIAGNOSIS — J309 Allergic rhinitis, unspecified: Secondary | ICD-10-CM

## 2014-01-31 DIAGNOSIS — R0609 Other forms of dyspnea: Secondary | ICD-10-CM

## 2014-01-31 DIAGNOSIS — N390 Urinary tract infection, site not specified: Secondary | ICD-10-CM

## 2014-01-31 DIAGNOSIS — J45909 Unspecified asthma, uncomplicated: Secondary | ICD-10-CM

## 2014-01-31 DIAGNOSIS — I10 Essential (primary) hypertension: Secondary | ICD-10-CM

## 2014-01-31 DIAGNOSIS — K921 Melena: Secondary | ICD-10-CM

## 2014-01-31 DIAGNOSIS — J209 Acute bronchitis, unspecified: Secondary | ICD-10-CM

## 2014-01-31 DIAGNOSIS — R0989 Other specified symptoms and signs involving the circulatory and respiratory systems: Secondary | ICD-10-CM

## 2014-01-31 DIAGNOSIS — J4489 Other specified chronic obstructive pulmonary disease: Secondary | ICD-10-CM

## 2014-01-31 MED ORDER — METHYLPREDNISOLONE ACETATE 80 MG/ML IJ SUSP
80.0000 mg | Freq: Once | INTRAMUSCULAR | Status: AC
  Administered 2014-01-31: 80 mg via INTRAMUSCULAR

## 2014-01-31 MED ORDER — HYDROCODONE-HOMATROPINE 5-1.5 MG/5ML PO SYRP: 5.0000 mL | ORAL_SOLUTION | Freq: Four times a day (QID) | ORAL | Status: DC | PRN

## 2014-01-31 NOTE — Assessment & Plan Note (Signed)
Re-start BP meds Risks associated with treatment noncompliance were discussed. Compliance was encouraged.  

## 2014-01-31 NOTE — Progress Notes (Signed)
Pre visit review using our clinic review tool, if applicable. No additional management support is needed unless otherwise documented below in the visit note. 

## 2014-01-31 NOTE — Progress Notes (Signed)
Subjective:     HPI  Lori [name] is a new pt for Lori Ronnald Ramp - former Lori [name] pt. It is an acute visit due to her URI sx x 2 days. Lori [name] was just hospitalized w/PNA. They just came back from [place] (she had a check up at [facility] for her COPD/UTI/allergies). Lori [name] was diagnosed w/a UTI. She had one episode of rectal bleed - negative RBC scan and stable Hgb was noted. She was given a Levaquin Rx by Lori [name] in [place]. C/o HTN, asthma/COPD - Lori Ellis Parents started her on several COPD meds - not started yet. I spoke w/Lori [name].    Review of Systems  Constitutional: Positive for fever. Negative for chills, activity change, appetite change, fatigue and unexpected weight change.  HENT: Negative for congestion, mouth sores and sinus pressure.   Eyes: Negative for visual disturbance.  Respiratory: Positive for cough and shortness of breath. Negative for chest tightness and wheezing.   Gastrointestinal: Negative for nausea and abdominal pain.  Genitourinary: Negative for frequency, difficulty urinating and vaginal pain.  Musculoskeletal: Negative for back pain and gait problem.  Skin: Negative for pallor and rash.  Neurological: Negative for dizziness, tremors, weakness, numbness and headaches.  Psychiatric/Behavioral: Negative for confusion and sleep disturbance.       Objective:   Physical Exam  Constitutional: She appears well-developed. No distress.  HENT:  Head: Normocephalic.  Right Ear: External ear normal.  Left Ear: External ear normal.  Nose: Nose normal.  Mouth/Throat: Oropharynx is clear and moist.  Eyes: Conjunctivae are normal. Pupils are equal, round, and reactive to light. Right eye exhibits no discharge. Left eye exhibits no discharge.  Neck: Normal range of motion. Neck supple. No JVD present. No tracheal deviation present. No thyromegaly present.  Cardiovascular: Normal rate, regular rhythm and normal heart sounds.   Pulmonary/Chest: No stridor. No  respiratory distress. She has no wheezes.  Abdominal: Soft. Bowel sounds are normal. She exhibits no distension and no mass. There is no tenderness. There is no rebound and no guarding.  Musculoskeletal: She exhibits no edema and no tenderness.  Lymphadenopathy:    She has no cervical adenopathy.  Neurological: She displays normal reflexes. No cranial nerve deficit. She exhibits normal muscle tone. Coordination normal.  Skin: No rash noted. No erythema.  Psychiatric: She has a normal mood and affect. Her behavior is normal. Judgment and thought content normal.    Lab Results  Component Value Date   WBC 6.2 08/20/2013   HGB 14.1 08/20/2013   HCT 41.7 08/20/2013   PLT 290.0 08/20/2013   GLUCOSE 82 08/20/2013   CHOL 252* 01/16/2012   TRIG 157.0* 01/16/2012   HDL 74.00 01/16/2012   LDLDIRECT 157.6 01/16/2012   ALT 17 08/20/2013   AST 19 08/20/2013   NA 140 08/20/2013   K 4.6 08/20/2013   CL 106 08/20/2013   CREATININE 0.6 08/20/2013   BUN 18 08/20/2013   CO2 26 08/20/2013   TSH 1.28 11/16/2010   INR 2.14* 10/05/2010   HGBA1C  Value: 5.7 (NOTE)                                                                       According to  the ADA Clinical Practice Recommendations for 2011, when HbA1c is used as a screening test:   >=6.5%   Diagnostic of Diabetes Mellitus           (if abnormal result  is confirmed)  5.7-6.4%   Increased risk of developing Diabetes Mellitus  References:Diagnosis and Classification of Diabetes Mellitus,Diabetes TMHD,6222,97(LGXQJ 1):S62-S69 and Standards of Medical Care in         Diabetes - 2011,Diabetes JHER,7408,14  (Suppl 1):S11-S61.* 10/04/2010         Assessment & Plan:

## 2014-01-31 NOTE — Assessment & Plan Note (Addendum)
Lavaquin as per Dr Irish Elders (pt has used it in the past w/o problems)

## 2014-01-31 NOTE — Assessment & Plan Note (Signed)
5/15 s/p Hosp Eval in [place] ng tagged RBC scan H/o colon at [facility] 1.5 y ago GI ref

## 2014-01-31 NOTE — Patient Instructions (Signed)
Take meds including Levaquin that Dr Irish Elders has prescribed

## 2014-01-31 NOTE — Assessment & Plan Note (Signed)
Restart meds.

## 2014-01-31 NOTE — Assessment & Plan Note (Signed)
Continue with current prescription therapy as reflected on the Med list.  

## 2014-02-01 DIAGNOSIS — N390 Urinary tract infection, site not specified: Secondary | ICD-10-CM | POA: Insufficient documentation

## 2014-02-01 NOTE — Assessment & Plan Note (Signed)
Lavaquin as per Dr Irish Elders (pt has used it in the past w/o problems) Urology consult later

## 2014-02-01 NOTE — Assessment & Plan Note (Signed)
Start Rx per Dr Jeannine Kitten Depomedrol 80 mg im given

## 2014-02-01 NOTE — Assessment & Plan Note (Signed)
She is to start Singulair

## 2014-02-04 ENCOUNTER — Telehealth: Payer: Self-pay | Admitting: Internal Medicine

## 2014-02-04 ENCOUNTER — Ambulatory Visit (INDEPENDENT_AMBULATORY_CARE_PROVIDER_SITE_OTHER)
Admission: RE | Admit: 2014-02-04 | Discharge: 2014-02-04 | Disposition: A | Discharge status: Home / Self Care | Payer: Medicare Other | Source: Ambulatory Visit | Attending: Internal Medicine | Admitting: Internal Medicine

## 2014-02-04 ENCOUNTER — Encounter: Payer: Self-pay | Admitting: Internal Medicine

## 2014-02-04 ENCOUNTER — Other Ambulatory Visit (INDEPENDENT_AMBULATORY_CARE_PROVIDER_SITE_OTHER): Payer: Medicare Other

## 2014-02-04 ENCOUNTER — Telehealth: Payer: Self-pay | Admitting: Cardiology

## 2014-02-04 ENCOUNTER — Ambulatory Visit (INDEPENDENT_AMBULATORY_CARE_PROVIDER_SITE_OTHER): Payer: Medicare Other | Admitting: Internal Medicine

## 2014-02-04 DIAGNOSIS — R0602 Shortness of breath: Secondary | ICD-10-CM

## 2014-02-04 DIAGNOSIS — I10 Essential (primary) hypertension: Secondary | ICD-10-CM

## 2014-02-04 DIAGNOSIS — R0789 Other chest pain: Secondary | ICD-10-CM | POA: Insufficient documentation

## 2014-02-04 DIAGNOSIS — R059 Cough, unspecified: Secondary | ICD-10-CM

## 2014-02-04 DIAGNOSIS — I519 Heart disease, unspecified: Secondary | ICD-10-CM

## 2014-02-04 DIAGNOSIS — E785 Hyperlipidemia, unspecified: Secondary | ICD-10-CM

## 2014-02-04 DIAGNOSIS — R7989 Other specified abnormal findings of blood chemistry: Secondary | ICD-10-CM | POA: Insufficient documentation

## 2014-02-04 DIAGNOSIS — I491 Atrial premature depolarization: Secondary | ICD-10-CM

## 2014-02-04 DIAGNOSIS — N390 Urinary tract infection, site not specified: Secondary | ICD-10-CM

## 2014-02-04 DIAGNOSIS — J45909 Unspecified asthma, uncomplicated: Secondary | ICD-10-CM

## 2014-02-04 DIAGNOSIS — R791 Abnormal coagulation profile: Secondary | ICD-10-CM

## 2014-02-04 DIAGNOSIS — R05 Cough: Secondary | ICD-10-CM

## 2014-02-04 LAB — CARDIAC PANEL
CK TOTAL: 52 U/L (ref 7–177)
CK-MB: 2.3 ng/mL (ref 0.3–4.0)
Relative Index: 4.4 calc — ABNORMAL HIGH (ref 0.0–2.5)

## 2014-02-04 LAB — CBC WITH DIFFERENTIAL/PLATELET
BASOS ABS: 0 10*3/uL (ref 0.0–0.1)
Basophils Relative: 0.2 % (ref 0.0–3.0)
Eosinophils Absolute: 0 10*3/uL (ref 0.0–0.7)
Eosinophils Relative: 0.1 % (ref 0.0–5.0)
HEMATOCRIT: 47.3 % — AB (ref 36.0–46.0)
HEMOGLOBIN: 15.7 g/dL — AB (ref 12.0–15.0)
Lymphocytes Relative: 22.3 % (ref 12.0–46.0)
Lymphs Abs: 2.4 10*3/uL (ref 0.7–4.0)
MCHC: 33.3 g/dL (ref 30.0–36.0)
MCV: 88.3 fl (ref 78.0–100.0)
MONO ABS: 0.9 10*3/uL (ref 0.1–1.0)
MONOS PCT: 8.1 % (ref 3.0–12.0)
NEUTROS ABS: 7.6 10*3/uL (ref 1.4–7.7)
Neutrophils Relative %: 69.3 % (ref 43.0–77.0)
Platelets: 387 10*3/uL (ref 150.0–400.0)
RBC: 5.35 Mil/uL — ABNORMAL HIGH (ref 3.87–5.11)
RDW: 14.2 % (ref 11.5–15.5)
WBC: 10.9 10*3/uL — ABNORMAL HIGH (ref 4.0–10.5)

## 2014-02-04 LAB — BASIC METABOLIC PANEL
BUN: 25 mg/dL — ABNORMAL HIGH (ref 6–23)
CHLORIDE: 101 meq/L (ref 96–112)
CO2: 26 mEq/L (ref 19–32)
Calcium: 9.4 mg/dL (ref 8.4–10.5)
Creatinine, Ser: 0.9 mg/dL (ref 0.4–1.2)
GFR: 67.42 mL/min (ref >60.00)
Glucose, Bld: 85 mg/dL (ref 70–99)
POTASSIUM: 3.7 meq/L (ref 3.5–5.1)
SODIUM: 137 meq/L (ref 135–145)

## 2014-02-04 LAB — BRAIN NATRIURETIC PEPTIDE: Pro B Natriuretic peptide (BNP): 40 pg/mL (ref 0.0–100.0)

## 2014-02-04 LAB — TROPONIN I: TROPONIN I: 0.04 ng/mL (ref <0.06)

## 2014-02-04 LAB — D-DIMER, QUANTITATIVE (NOT AT ARMC): D DIMER QUANT: 0.65 ug{FEU}/mL — AB (ref 0.00–0.48)

## 2014-02-04 LAB — TSH: TSH: 2.11 u[IU]/mL (ref 0.35–4.50)

## 2014-02-04 NOTE — Telephone Encounter (Signed)
Relevant patient education assigned to patient using Emmi. ° °

## 2014-02-04 NOTE — Patient Instructions (Signed)

## 2014-02-04 NOTE — Telephone Encounter (Deleted)
error 

## 2014-02-04 NOTE — Assessment & Plan Note (Signed)
Her EKG shows PAC's which are new compared to the prior EKG but there are no acute ST/T wave changes Her cardiac enzymes are all normal but her d-dimer is elevated - she has risk factors for PE so I will get a CT angio done

## 2014-02-04 NOTE — Assessment & Plan Note (Signed)
Her CXR is normal.

## 2014-02-04 NOTE — Assessment & Plan Note (Signed)
She wants to see a urologist

## 2014-02-04 NOTE — Progress Notes (Signed)
Subjective:    Patient ID: Lori [name], female    DOB: 12-13-1925, 80 y.o.   MRN: [id number]  HPI Comments: She just returned from [place] where she reports a huge evaluation, she was told that she had a fib and needed coumadin but was not treated. I have no records from her visit there.  Chest Pain  This is a new problem. The current episode started in the past 7 days. The onset quality is gradual. The problem occurs intermittently. The problem has been unchanged. The pain is present in the substernal region. The pain is at a severity of 2/10. The pain is mild. The quality of the pain is described as pressure. The pain does not radiate. Associated symptoms include a cough (NP), irregular heartbeat, malaise/fatigue, palpitations and shortness of breath. Pertinent negatives include no abdominal pain, back pain, claudication, diaphoresis, dizziness, exertional chest pressure, fever, headaches, hemoptysis, leg pain, lower extremity edema, nausea, near-syncope, numbness, orthopnea, PND, sputum production, syncope, vomiting or weakness. Nothing relieves the cough. The pain is aggravated by deep breathing. She has tried nothing for the symptoms. Risk factors: recent air travel.      Review of Systems  Constitutional: Positive for malaise/fatigue. Negative for fever and diaphoresis.  Respiratory: Positive for cough (NP) and shortness of breath. Negative for hemoptysis and sputum production.   Cardiovascular: Positive for chest pain and palpitations. Negative for orthopnea, claudication, syncope, PND and near-syncope.  Gastrointestinal: Negative for nausea, vomiting and abdominal pain.  Musculoskeletal: Negative for back pain.  Neurological: Negative for dizziness, weakness, numbness and headaches.       Objective:   Physical Exam  Vitals reviewed. Constitutional: She is oriented to person, place, and time. She appears well-developed and well-nourished.  Non-toxic appearance. She does not  have a sickly appearance. She does not appear ill. No distress.  HENT:  Head: Normocephalic and atraumatic.  Mouth/Throat: Oropharynx is clear and moist. No oropharyngeal exudate.  Eyes: Conjunctivae are normal. Right eye exhibits no discharge. Left eye exhibits no discharge. No scleral icterus.  Neck: Normal range of motion. Neck supple. No JVD present. No tracheal deviation present. No thyromegaly present.  Cardiovascular: Normal rate, regular rhythm, S1 normal, S2 normal, normal heart sounds and intact distal pulses.   Extrasystoles are present. Exam reveals no gallop, no S3, no S4 and no friction rub.   No murmur heard. Pulses:      Carotid pulses are 1+ on the right side, and 1+ on the left side.      Radial pulses are 1+ on the right side, and 1+ on the left side.       Femoral pulses are 1+ on the right side, and 1+ on the left side.      Popliteal pulses are 1+ on the right side, and 1+ on the left side.       Dorsalis pedis pulses are 1+ on the right side, and 1+ on the left side.       Posterior tibial pulses are 1+ on the right side, and 1+ on the left side.  Pulmonary/Chest: Breath sounds normal. No accessory muscle usage or stridor. Not tachypneic. She is in respiratory distress. She has no decreased breath sounds. She has no wheezes. She has no rhonchi. She has no rales. She exhibits no tenderness.  She has audible wheezing heard across the room but her lungs are clear  Abdominal: Soft. Bowel sounds are normal. She exhibits no distension and no mass. There is no  tenderness. There is no rebound and no guarding.  Musculoskeletal: Normal range of motion. She exhibits no edema and no tenderness.  Lymphadenopathy:    She has no cervical adenopathy.  Neurological: She is oriented to person, place, and time.  Skin: Skin is warm and dry. No rash noted. She is not diaphoretic. No erythema. No pallor.  Psychiatric: She has a normal mood and affect. Her behavior is normal. Judgment and  thought content normal.     Lab Results  Component Value Date   WBC 6.2 08/20/2013   HGB 14.1 08/20/2013   HCT 41.7 08/20/2013   PLT 290.0 08/20/2013   GLUCOSE 82 08/20/2013   CHOL 252* 01/16/2012   TRIG 157.0* 01/16/2012   HDL 74.00 01/16/2012   LDLDIRECT 157.6 01/16/2012   ALT 17 08/20/2013   AST 19 08/20/2013   NA 140 08/20/2013   K 4.6 08/20/2013   CL 106 08/20/2013   CREATININE 0.6 08/20/2013   BUN 18 08/20/2013   CO2 26 08/20/2013   TSH 1.28 11/16/2010   INR 2.14* 10/05/2010   HGBA1C  Value: 5.7 (NOTE)                                                                       According to the ADA Clinical Practice Recommendations for 2011, when HbA1c is used as a screening test:   >=6.5%   Diagnostic of Diabetes Mellitus           (if abnormal result  is confirmed)  5.7-6.4%   Increased risk of developing Diabetes Mellitus  References:Diagnosis and Classification of Diabetes Mellitus,Diabetes OZDG,6440,34(VQQVZ 1):S62-S69 and Standards of Medical Care in         Diabetes - 2011,Diabetes Care,2011,34  (Suppl 1):S11-S61.* 10/04/2010       Assessment & Plan:

## 2014-02-04 NOTE — Assessment & Plan Note (Signed)
She was treated 3 days ago for asthma exacerbation, improvement noted today

## 2014-02-04 NOTE — Telephone Encounter (Signed)
Rec'd from [facility] forward 5 pages to Dr. Ronnald Ramp

## 2014-02-04 NOTE — Assessment & Plan Note (Signed)
I don't think she has a fib It look like PAC's caused by severe lung disease

## 2014-02-04 NOTE — Assessment & Plan Note (Signed)
Will get a CT done to see if there is a clot

## 2014-02-05 ENCOUNTER — Telehealth: Payer: Self-pay | Admitting: Internal Medicine

## 2014-02-05 NOTE — Telephone Encounter (Signed)
I spoke with the patient and she reports that she is "too sick to lift my head off the pillow".  She states she and her husband are just too sick "we both have the same thing".  Patient believes she and her husband contracted something on their recent trip to [place].  She was by primary care on 5/15 and 5/19.   The referral in the system is for hematochezia.  She has had no further rectal bleeding.  Recent GI bleed in [place] where she was hospitalized.  Hct from yesterday is 15.7.  Patient wants to see Dr. Hilarie Fredrickson, but his next available is 03/25/14 she refuses this appt, she is encouraged to come in and see an APP this week for evaluation.  She refuses this appt also.  She kept repeating she was "just so sick", I encouraged her again to come in and see APP the refuses.  She also would not schedule the appt for 03/25/14.

## 2014-02-06 ENCOUNTER — Ambulatory Visit (INDEPENDENT_AMBULATORY_CARE_PROVIDER_SITE_OTHER)
Admission: RE | Admit: 2014-02-06 | Discharge: 2014-02-06 | Disposition: A | Discharge status: Home / Self Care | Payer: Medicare Other | Source: Ambulatory Visit | Attending: Internal Medicine | Admitting: Internal Medicine

## 2014-02-06 ENCOUNTER — Encounter: Payer: Self-pay | Admitting: Internal Medicine

## 2014-02-06 DIAGNOSIS — R05 Cough: Secondary | ICD-10-CM

## 2014-02-06 DIAGNOSIS — R059 Cough, unspecified: Secondary | ICD-10-CM

## 2014-02-06 DIAGNOSIS — R791 Abnormal coagulation profile: Secondary | ICD-10-CM

## 2014-02-06 DIAGNOSIS — R7989 Other specified abnormal findings of blood chemistry: Secondary | ICD-10-CM

## 2014-02-06 DIAGNOSIS — R0789 Other chest pain: Secondary | ICD-10-CM

## 2014-02-06 MED ORDER — IOHEXOL 350 MG/ML SOLN
80.0000 mL | Freq: Once | INTRAVENOUS | Status: AC | PRN
  Administered 2014-02-06: 80 mL via INTRAVENOUS

## 2014-02-07 ENCOUNTER — Telehealth: Payer: Self-pay | Admitting: Internal Medicine

## 2014-02-07 ENCOUNTER — Telehealth: Payer: Self-pay | Admitting: *Deleted

## 2014-02-07 NOTE — Telephone Encounter (Signed)
ok 

## 2014-02-07 NOTE — Telephone Encounter (Signed)
Mliss Sax notified and updated medication list faxed to [phone] per her request.

## 2014-02-07 NOTE — Telephone Encounter (Signed)
Call from nurse at [facility].  Lori [name] is weak and has no energy.  She is taking Z-Pak in addition to other meds and they are wondering if she should be taking both.  Nurse states that patient is dehydrated, having been sick x 2 weeks.  Vital signs okay.

## 2014-02-07 NOTE — Telephone Encounter (Signed)
She can stop the Zpak

## 2014-02-07 NOTE — Telephone Encounter (Signed)
Mliss Sax RN is calling requesting a written order discontinuing Z Pak.  Please fax to [phone]

## 2014-02-11 ENCOUNTER — Telehealth: Payer: Self-pay | Admitting: Internal Medicine

## 2014-02-11 NOTE — Telephone Encounter (Signed)
Left message for patient to call back  

## 2014-02-12 ENCOUNTER — Telehealth: Payer: Self-pay | Admitting: Cardiology

## 2014-02-12 NOTE — Telephone Encounter (Signed)
New problem         Pt was see at a [facility]. ([facility] And has all the notes with her and would like to be see ASAP.  Pt was told she has an irregular heart beat and need to be seen by her Card. ASAP.   I told pt of the next avaible pt said to far.  Pt does not want to see a PA.  Pt would like a call back please for RN.

## 2014-02-12 NOTE — Telephone Encounter (Signed)
Patient called and she reports that she has an ingrown hair near her anus.  She is advised that she needs to see her primary care for this, we don't treat this.

## 2014-02-13 NOTE — Telephone Encounter (Signed)
I have scheduled an appt for her with Dr Aundra Dubin on Monday June 1 at 9:45AM. Her line was busy several times when I tried to call her to let her know. Please call her for me to let her know I have made this appt for her with Dr Aundra Dubin. Thanks.

## 2014-02-14 ENCOUNTER — Other Ambulatory Visit: Payer: Medicare Other

## 2014-02-14 ENCOUNTER — Encounter: Payer: Self-pay | Admitting: *Deleted

## 2014-02-17 ENCOUNTER — Encounter: Payer: Self-pay | Admitting: Cardiology

## 2014-02-17 ENCOUNTER — Ambulatory Visit (INDEPENDENT_AMBULATORY_CARE_PROVIDER_SITE_OTHER): Payer: Medicare Other | Admitting: Cardiology

## 2014-02-17 VITALS — BP 138/59 | HR 91 | Ht 65.0 in | Wt 135.0 lb

## 2014-02-17 DIAGNOSIS — R002 Palpitations: Secondary | ICD-10-CM

## 2014-02-17 DIAGNOSIS — J45909 Unspecified asthma, uncomplicated: Secondary | ICD-10-CM | POA: Insufficient documentation

## 2014-02-17 DIAGNOSIS — I499 Cardiac arrhythmia, unspecified: Secondary | ICD-10-CM

## 2014-02-17 DIAGNOSIS — I10 Essential (primary) hypertension: Secondary | ICD-10-CM

## 2014-02-17 MED ORDER — DILTIAZEM HCL ER COATED BEADS 120 MG PO CP24: 120.0000 mg | ORAL_CAPSULE | Freq: Every day | ORAL | Status: DC
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
## 2014-02-17 NOTE — Patient Instructions (Signed)
Start Diltiazem CD 120mg  daily.   Your physician recommends that you schedule a follow-up appointment in: 1 month with Dr Aundra Dubin.   Bring your records when Mr Lori [name] comes for his appointment at 38 Noon today.

## 2014-02-24 ENCOUNTER — Telehealth: Payer: Self-pay | Admitting: Cardiology

## 2014-02-24 NOTE — Telephone Encounter (Signed)
New message     Dr Loreli Slot from [facility] want to talk to Dr Aundra Dubin at his convenience regarding this patient.  Please call him at [phone].

## 2014-03-11 ENCOUNTER — Telehealth: Payer: Self-pay | Admitting: *Deleted

## 2014-03-11 MED ORDER — DICYCLOMINE HCL 10 MG PO CAPS: 10.0000 mg | ORAL_CAPSULE | Freq: Three times a day (TID) | ORAL | Status: DC | PRN

## 2014-03-11 MED ORDER — ZOLPIDEM TARTRATE 5 MG PO TABS: ORAL_TABLET | ORAL | Status: DC

## 2014-03-11 NOTE — Telephone Encounter (Signed)
Left msg on triage stating [facility] been trying to get refills on wife prescriptions have'nt heard back. Called Mr. Armond back no answer LMOM RTC needing name of medications pt is needing...Lori [name]

## 2014-03-11 NOTE — Telephone Encounter (Signed)
Notified pt covering md only covered for 30 days will fax script to [facility]...Johny Chess

## 2014-03-11 NOTE — Telephone Encounter (Signed)
Mr. Hisle called back wife is needing ambien and bentyl sent to [facility]. MD out of office> Is this ok...Johny Chess

## 2014-03-11 NOTE — Telephone Encounter (Signed)
These medications are among those which experts have documented to have a very  high risk of affecting  mental  alertness  & balance. This results in increased risk of falling with serious health or life threatening injury. Such medication should be taken as infrequently as possible and @  the lowest possible dose.It should not be taken with alcohol, sedatives  or other agents which have a similar  adverse risk potential. These risks are greater as we age as there is decreased ability of the liver and kidneys to metabolize and excrete the medication, resulting in   increased blood levels of the active ingredient. Ambien 5 mg q 3rd night prn only Bentyl tid prn only #30.  These recommendations are based on the standard care for someone her age.  If she does not agree; she can wait for Dr. Ronnald Ramp to review upon his return.

## 2014-03-12 ENCOUNTER — Other Ambulatory Visit: Payer: Self-pay

## 2014-03-12 ENCOUNTER — Telehealth: Payer: Self-pay

## 2014-03-12 MED ORDER — DICYCLOMINE HCL 10 MG PO CAPS: 10.0000 mg | ORAL_CAPSULE | Freq: Three times a day (TID) | ORAL | Status: DC | PRN

## 2014-03-12 MED ORDER — ZOLPIDEM TARTRATE 5 MG PO TABS: ORAL_TABLET | ORAL | Status: DC

## 2014-03-12 NOTE — Telephone Encounter (Signed)
Faxed hardcopy for Zolpidem 5 mg #30 with 2 refills to [facility] [place].at [phone].

## 2014-03-12 NOTE — Telephone Encounter (Signed)
Received refill request from Lori [name]  request refills for zolpidem 5 mg . Rx last written 09/04/13 and pt last seen 02/04/14  . Please advise for this [name] pt.Thanks

## 2014-03-12 NOTE — Telephone Encounter (Signed)
Done hardcopy to robin  

## 2014-03-18 ENCOUNTER — Ambulatory Visit: Payer: Medicare Other | Admitting: Cardiology

## 2014-04-04 ENCOUNTER — Encounter: Payer: Self-pay | Admitting: Cardiology

## 2014-04-09 ENCOUNTER — Telehealth: Payer: Self-pay | Admitting: Internal Medicine

## 2014-04-09 NOTE — Telephone Encounter (Signed)
Patient Information:  Caller Name: Lori [name]  Phone: [phone]  Patient: Lori [name]  Gender: Female  DOB: 03/30/1926  Age: 80 Years  PCP: Scarlette Calico (Adults only)  Office Follow Up:  Does the office need to follow up with this patient?: No  Instructions For The Office: N/A   Symptoms  Reason For Call & Symptoms: Pt reports she is calling to schedule appt for her ears, not able to hear well.  [place] there and what ever he did she can not hear. Pt states she just wants an appt today if possible.  Reviewed Health History In EMR: Yes  Reviewed Medications In EMR: Yes  Reviewed Allergies In EMR: Yes  Reviewed Surgeries / Procedures: Yes  Date of Onset of Symptoms: 04/09/2014  Guideline(s) Used:  No Protocol Available - Sick Adult  Disposition Per Guideline:   Home Care  Reason For Disposition Reached:   Patient's symptoms are safe to treat at home per nursing judgment  Advice Given:  Call Back If:  New symptoms develop  You become worse.  Patient Will Follow Care Advice:  YES  Appt scheduled 04/10/14 1130 with Dr Linna Darner, per Tammy.

## 2014-04-10 ENCOUNTER — Ambulatory Visit (INDEPENDENT_AMBULATORY_CARE_PROVIDER_SITE_OTHER): Payer: Medicare Other | Admitting: Internal Medicine

## 2014-04-10 ENCOUNTER — Telehealth: Payer: Self-pay | Admitting: Cardiology

## 2014-04-10 ENCOUNTER — Encounter: Payer: Self-pay | Admitting: Internal Medicine

## 2014-04-10 VITALS — BP 118/80 | HR 76 | Temp 97.9°F | Wt 135.8 lb

## 2014-04-10 DIAGNOSIS — H833X3 Noise effects on inner ear, bilateral: Secondary | ICD-10-CM

## 2014-04-10 DIAGNOSIS — H833X9 Noise effects on inner ear, unspecified ear: Secondary | ICD-10-CM

## 2014-04-10 DIAGNOSIS — H6120 Impacted cerumen, unspecified ear: Secondary | ICD-10-CM

## 2014-04-10 DIAGNOSIS — H6123 Impacted cerumen, bilateral: Secondary | ICD-10-CM
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
## 2014-04-10 NOTE — Telephone Encounter (Signed)
Lori [name] is going to try to find out if Dr Loreli Slot (cardiologist at [facility])  had an recommendations after holter monitor was completed 01/27/14 (Dr Aundra Dubin has that report).

## 2014-04-10 NOTE — Patient Instructions (Signed)
The ENT referral will be scheduled and you'll be notified of the time.

## 2014-04-10 NOTE — Progress Notes (Signed)
Pre visit review using our clinic review tool, if applicable. No additional management support is needed unless otherwise documented below in the visit note. 

## 2014-04-10 NOTE — Telephone Encounter (Signed)
New message     Want Webb Silversmith to know that patient did see a cardiologist in [place].  She was disconnected from you earlier
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
## 2014-04-14 NOTE — Telephone Encounter (Signed)
Dr Aundra Dubin reviewed notes from [facility]-- no definite afib noted apparently.

## 2014-04-18 ENCOUNTER — Telehealth: Payer: Self-pay | Admitting: *Deleted

## 2014-04-18 NOTE — Telephone Encounter (Signed)
Per Dr Orpah Greek should stop aspirin before starting Xarelto.

## 2014-04-18 NOTE — Telephone Encounter (Signed)
Dr Aundra Dubin received note from [facility] dated 04/10/14-  Per Dr Loreli Slot- Her holter monitor shows AFib, and I would advise initiation of anticoagulation given high risk of storke and using calcium channel blocker such as diltiazem or verapamil.  Per Dr Aundra Dubin  Start Xarelto 20mg  daily.  LMTCB for pt.

## 2014-04-21 NOTE — Telephone Encounter (Signed)
LMTCB

## 2014-04-22 NOTE — Telephone Encounter (Signed)
LMTCB

## 2014-05-09 MED ORDER — RIVAROXABAN 20 MG PO TABS: 20.0000 mg | ORAL_TABLET | Freq: Every day | ORAL | Status: DC

## 2014-05-09 NOTE — Telephone Encounter (Signed)
Pt advised, verbalized understanding. Pt reminded to keep follow up appt with Dr Aundra Dubin 06/09/14.

## 2014-05-09 NOTE — Telephone Encounter (Signed)
Follow up  ° ° ° °Returning call back to nurse  °

## 2014-05-16 ENCOUNTER — Telehealth: Payer: Self-pay | Admitting: *Deleted

## 2014-05-16 NOTE — Telephone Encounter (Signed)
Received notification from [place] approved through 05/15/15 [id number]. [phone]. Manning advised.

## 2014-06-02 ENCOUNTER — Other Ambulatory Visit: Payer: Self-pay | Admitting: Dermatology

## 2014-06-05 ENCOUNTER — Encounter: Payer: Self-pay | Admitting: Internal Medicine

## 2014-06-05 ENCOUNTER — Ambulatory Visit (INDEPENDENT_AMBULATORY_CARE_PROVIDER_SITE_OTHER): Payer: Medicare Other | Admitting: Internal Medicine

## 2014-06-05 ENCOUNTER — Other Ambulatory Visit (INDEPENDENT_AMBULATORY_CARE_PROVIDER_SITE_OTHER): Payer: Medicare Other

## 2014-06-05 ENCOUNTER — Ambulatory Visit: Payer: Medicare Other | Admitting: Internal Medicine

## 2014-06-05 ENCOUNTER — Ambulatory Visit (INDEPENDENT_AMBULATORY_CARE_PROVIDER_SITE_OTHER)
Admission: RE | Admit: 2014-06-05 | Discharge: 2014-06-05 | Disposition: A | Discharge status: Home / Self Care | Payer: Medicare Other | Source: Ambulatory Visit | Attending: Internal Medicine | Admitting: Internal Medicine

## 2014-06-05 VITALS — BP 128/76 | HR 60 | Temp 98.7°F | Resp 16 | Ht 65.0 in | Wt 138.0 lb

## 2014-06-05 DIAGNOSIS — M546 Pain in thoracic spine: Secondary | ICD-10-CM

## 2014-06-05 DIAGNOSIS — J449 Chronic obstructive pulmonary disease, unspecified: Secondary | ICD-10-CM

## 2014-06-05 DIAGNOSIS — E559 Vitamin D deficiency, unspecified: Secondary | ICD-10-CM

## 2014-06-05 DIAGNOSIS — I10 Essential (primary) hypertension: Secondary | ICD-10-CM

## 2014-06-05 DIAGNOSIS — I6529 Occlusion and stenosis of unspecified carotid artery: Secondary | ICD-10-CM

## 2014-06-05 DIAGNOSIS — M810 Age-related osteoporosis without current pathological fracture: Secondary | ICD-10-CM

## 2014-06-05 DIAGNOSIS — I4891 Unspecified atrial fibrillation: Secondary | ICD-10-CM

## 2014-06-05 DIAGNOSIS — Z0000 Encounter for general adult medical examination without abnormal findings: Secondary | ICD-10-CM

## 2014-06-05 DIAGNOSIS — Z23 Encounter for immunization: Secondary | ICD-10-CM

## 2014-06-05 DIAGNOSIS — F411 Generalized anxiety disorder: Secondary | ICD-10-CM

## 2014-06-05 DIAGNOSIS — I4819 Other persistent atrial fibrillation: Secondary | ICD-10-CM

## 2014-06-05 DIAGNOSIS — E785 Hyperlipidemia, unspecified: Secondary | ICD-10-CM

## 2014-06-05 DIAGNOSIS — J4489 Other specified chronic obstructive pulmonary disease: Secondary | ICD-10-CM

## 2014-06-05 DIAGNOSIS — G4700 Insomnia, unspecified: Secondary | ICD-10-CM

## 2014-06-05 LAB — LIPID PANEL
CHOLESTEROL: 247 mg/dL — AB (ref 0–200)
HDL: 75.6 mg/dL (ref >39.00)
LDL CALC: 156 mg/dL — AB (ref 0–99)
NonHDL: 171.4
Total CHOL/HDL Ratio: 3
Triglycerides: 78 mg/dL (ref 0.0–149.0)
VLDL: 15.6 mg/dL (ref 0.0–40.0)

## 2014-06-05 LAB — COMPREHENSIVE METABOLIC PANEL
ALK PHOS: 88 U/L (ref 39–117)
ALT: 19 U/L (ref 0–35)
AST: 18 U/L (ref 0–37)
Albumin: 4.2 g/dL (ref 3.5–5.2)
BUN: 25 mg/dL — ABNORMAL HIGH (ref 6–23)
CO2: 29 meq/L (ref 19–32)
CREATININE: 0.7 mg/dL (ref 0.4–1.2)
Calcium: 9.2 mg/dL (ref 8.4–10.5)
Chloride: 101 mEq/L (ref 96–112)
GFR: 91.81 mL/min (ref >60.00)
GLUCOSE: 100 mg/dL — AB (ref 70–99)
Potassium: 4 mEq/L (ref 3.5–5.1)
SODIUM: 137 meq/L (ref 135–145)
TOTAL PROTEIN: 7.1 g/dL (ref 6.0–8.3)
Total Bilirubin: 0.8 mg/dL (ref 0.2–1.2)

## 2014-06-05 LAB — CBC WITH DIFFERENTIAL/PLATELET
BASOS PCT: 0.8 % (ref 0.0–3.0)
Basophils Absolute: 0 10*3/uL (ref 0.0–0.1)
EOS PCT: 3.9 % (ref 0.0–5.0)
Eosinophils Absolute: 0.2 10*3/uL (ref 0.0–0.7)
HEMATOCRIT: 42.5 % (ref 36.0–46.0)
Hemoglobin: 14.1 g/dL (ref 12.0–15.0)
LYMPHS ABS: 1.7 10*3/uL (ref 0.7–4.0)
Lymphocytes Relative: 28 % (ref 12.0–46.0)
MCHC: 33.1 g/dL (ref 30.0–36.0)
MCV: 88.1 fl (ref 78.0–100.0)
Monocytes Absolute: 0.4 10*3/uL (ref 0.1–1.0)
Monocytes Relative: 6.3 % (ref 3.0–12.0)
Neutro Abs: 3.7 10*3/uL (ref 1.4–7.7)
Neutrophils Relative %: 61 % (ref 43.0–77.0)
Platelets: 358 10*3/uL (ref 150.0–400.0)
RBC: 4.82 Mil/uL (ref 3.87–5.11)
RDW: 13.6 % (ref 11.5–15.5)
WBC: 6.1 10*3/uL (ref 4.0–10.5)

## 2014-06-05 LAB — VITAMIN D 25 HYDROXY (VIT D DEFICIENCY, FRACTURES): VITD: 28.51 ng/mL — ABNORMAL LOW (ref 30.00–100.00)

## 2014-06-05 LAB — TSH: TSH: 1.38 u[IU]/mL (ref 0.35–4.50)

## 2014-06-05 MED ORDER — DICYCLOMINE HCL 10 MG PO CAPS: 10.0000 mg | ORAL_CAPSULE | Freq: Three times a day (TID) | ORAL | Status: DC | PRN

## 2014-06-05 MED ORDER — RIVAROXABAN 20 MG PO TABS: 20.0000 mg | ORAL_TABLET | Freq: Every day | ORAL | Status: DC

## 2014-06-05 MED ORDER — CHOLECALCIFEROL 1.25 MG (50000 UT) PO TABS: 1.0000 | ORAL_TABLET | ORAL | Status: DC

## 2014-06-05 NOTE — Progress Notes (Signed)
Subjective:    Patient ID: Lori [name], female    DOB: 1926-03-18, 80 y.o.   MRN: [id number]  Back Pain This is a recurrent problem. The current episode started more than 1 month ago. The problem occurs intermittently. The problem is unchanged. The pain is present in the thoracic spine. The quality of the pain is described as aching. The pain does not radiate. The pain is at a severity of 3/10. The pain is moderate. The pain is worse during the day. The symptoms are aggravated by bending and position. Pertinent negatives include no abdominal pain, bladder incontinence, bowel incontinence, chest pain, dysuria, fever, headaches, leg pain, numbness, paresis, paresthesias, pelvic pain, perianal numbness, tingling, weakness or weight loss. Risk factors include history of osteoporosis. She has tried analgesics for the symptoms. The treatment provided mild relief.      Review of Systems  Constitutional: Positive for fatigue. Negative for fever, chills, weight loss, diaphoresis, activity change, appetite change and unexpected weight change.  HENT: Negative.   Eyes: Negative.  Negative for visual disturbance.  Respiratory: Negative.  Negative for apnea, cough, choking, chest tightness, shortness of breath, wheezing and stridor.   Cardiovascular: Negative.  Negative for chest pain, palpitations and leg swelling.  Gastrointestinal: Negative.  Negative for nausea, vomiting, abdominal pain, diarrhea, constipation, blood in stool and bowel incontinence.  Endocrine: Negative.   Genitourinary: Negative.  Negative for bladder incontinence, dysuria and pelvic pain.  Musculoskeletal: Positive for back pain. Negative for arthralgias, gait problem, joint swelling, myalgias, neck pain and neck stiffness.  Skin: Negative.  Negative for color change and rash.  Allergic/Immunologic: Negative.   Neurological: Negative.  Negative for dizziness, tingling, tremors, syncope, weakness, numbness, headaches and  paresthesias.  Hematological: Negative.  Negative for adenopathy. Does not bruise/bleed easily.  Psychiatric/Behavioral: Negative.        Objective:   Physical Exam  Vitals reviewed. Constitutional: She is oriented to person, place, and time. She appears well-developed and well-nourished. No distress.  HENT:  Head: Normocephalic and atraumatic.  Mouth/Throat: Oropharynx is clear and moist. No oropharyngeal exudate.  Eyes: Conjunctivae are normal. Right eye exhibits no discharge. Left eye exhibits no discharge. No scleral icterus.  Neck: Normal range of motion. Neck supple. No JVD present. No tracheal deviation present. No thyromegaly present.  Cardiovascular: Normal rate, normal heart sounds and intact distal pulses.  An irregularly irregular rhythm present. Exam reveals no gallop and no friction rub.   No murmur heard. Pulmonary/Chest: Effort normal and breath sounds normal. No stridor. No respiratory distress. She has no wheezes. She has no rales. She exhibits no tenderness.  Breast exam not done at her request  Abdominal: Soft. Bowel sounds are normal. She exhibits no distension and no mass. There is no tenderness. There is no rebound and no guarding.  Genitourinary:  Exam not done at her request  Musculoskeletal: Normal range of motion. She exhibits no edema and no tenderness.       Thoracic back: She exhibits normal range of motion, no bony tenderness, no swelling, no edema, no deformity, no laceration, no pain, no spasm and normal pulse.       Lumbar back: Normal. She exhibits normal range of motion, no tenderness, no bony tenderness, no swelling, no edema, no deformity, no laceration, no pain, no spasm and normal pulse.       Back:  Lymphadenopathy:    She has no cervical adenopathy.  Neurological: She is oriented to person, place, and time.  Skin: Skin  is warm and dry. No rash noted. She is not diaphoretic. No erythema. No pallor.  Psychiatric: She has a normal mood and affect.  Her behavior is normal. Judgment and thought content normal.    Lab Results  Component Value Date   WBC 10.9* 02/04/2014   HGB 15.7* 02/04/2014   HCT 47.3* 02/04/2014   PLT 387.0 02/04/2014   GLUCOSE 85 02/04/2014   CHOL 252* 01/16/2012   TRIG 157.0* 01/16/2012   HDL 74.00 01/16/2012   LDLDIRECT 157.6 01/16/2012   ALT 17 08/20/2013   AST 19 08/20/2013   NA 137 02/04/2014   K 3.7 02/04/2014   CL 101 02/04/2014   CREATININE 0.9 02/04/2014   BUN 25* 02/04/2014   CO2 26 02/04/2014   TSH 2.11 02/04/2014   INR 1.0 01/22/2014   HGBA1C  Value: 5.7 (NOTE)                                                                       According to the ADA Clinical Practice Recommendations for 2011, when HbA1c is used as a screening test:   >=6.5%   Diagnostic of Diabetes Mellitus           (if abnormal result  is confirmed)  5.7-6.4%   Increased risk of developing Diabetes Mellitus  References:Diagnosis and Classification of Diabetes Mellitus,Diabetes EZMO,2947,65(YYTKP 1):S62-S69 and Standards of Medical Care in         Diabetes - 2011,Diabetes Care,2011,34  (Suppl 1):S11-S61.* 10/04/2010        Assessment & Plan:

## 2014-06-05 NOTE — Patient Instructions (Signed)
Preventive Care for Adults A healthy lifestyle and preventive care can promote health and wellness. Preventive health guidelines for women include the following key practices.  A routine yearly physical is a good way to check with your health care provider about your health and preventive screening. It is a chance to share any concerns and updates on your health and to receive a thorough exam.  Visit your dentist for a routine exam and preventive care every 6 months. Brush your teeth twice a day and floss once a day. Good oral hygiene prevents tooth decay and gum disease.  The frequency of eye exams is based on your age, health, family medical history, use of contact lenses, and other factors. Follow your health care provider's recommendations for frequency of eye exams.  Eat a healthy diet. Foods like vegetables, fruits, whole grains, low-fat dairy products, and lean protein foods contain the nutrients you need without too many calories. Decrease your intake of foods high in solid fats, added sugars, and salt. Eat the right amount of calories for you.Get information about a proper diet from your health care provider, if necessary.  Regular physical exercise is one of the most important things you can do for your health. Most adults should get at least 150 minutes of moderate-intensity exercise (any activity that increases your heart rate and causes you to sweat) each week. In addition, most adults need muscle-strengthening exercises on 2 or more days a week.  Maintain a healthy weight. The body mass index (BMI) is a screening tool to identify possible weight problems. It provides an estimate of body fat based on height and weight. Your health care provider can find your BMI and can help you achieve or maintain a healthy weight.For adults 20 years and older:  A BMI below 18.5 is considered underweight.  A BMI of 18.5 to 24.9 is normal.  A BMI of 25 to 29.9 is considered overweight.  A BMI of  30 and above is considered obese.  Maintain normal blood lipids and cholesterol levels by exercising and minimizing your intake of saturated fat. Eat a balanced diet with plenty of fruit and vegetables. Blood tests for lipids and cholesterol should begin at age 76 and be repeated every 5 years. If your lipid or cholesterol levels are high, you are over 50, or you are at high risk for heart disease, you may need your cholesterol levels checked more frequently.Ongoing high lipid and cholesterol levels should be treated with medicines if diet and exercise are not working.  If you smoke, find out from your health care provider how to quit. If you do not use tobacco, do not start.  Lung cancer screening is recommended for adults aged 22-80 years who are at high risk for developing lung cancer because of a history of smoking. A yearly low-dose CT scan of the lungs is recommended for people who have at least a 30-pack-year history of smoking and are a current smoker or have quit within the past 15 years. A pack year of smoking is smoking an average of 1 pack of cigarettes a day for 1 year (for example: 1 pack a day for 30 years or 2 packs a day for 15 years). Yearly screening should continue until the smoker has stopped smoking for at least 15 years. Yearly screening should be stopped for people who develop a health problem that would prevent them from having lung cancer treatment.  If you are pregnant, do not drink alcohol. If you are breastfeeding,  be very cautious about drinking alcohol. If you are not pregnant and choose to drink alcohol, do not have more than 1 drink per day. One drink is considered to be 12 ounces (355 mL) of beer, 5 ounces (148 mL) of wine, or 1.5 ounces (44 mL) of liquor.  Avoid use of street drugs. Do not share needles with anyone. Ask for help if you need support or instructions about stopping the use of drugs.  High blood pressure causes heart disease and increases the risk of  stroke. Your blood pressure should be checked at least every 1 to 2 years. Ongoing high blood pressure should be treated with medicines if weight loss and exercise do not work.  If you are 75-52 years old, ask your health care provider if you should take aspirin to prevent strokes.  Diabetes screening involves taking a blood sample to check your fasting blood sugar level. This should be done once every 3 years, after age 15, if you are within normal weight and without risk factors for diabetes. Testing should be considered at a younger age or be carried out more frequently if you are overweight and have at least 1 risk factor for diabetes.  Breast cancer screening is essential preventive care for women. You should practice "breast self-awareness." This means understanding the normal appearance and feel of your breasts and may include breast self-examination. Any changes detected, no matter how small, should be reported to a health care provider. Women in their 58s and 30s should have a clinical breast exam (CBE) by a health care provider as part of a regular health exam every 1 to 3 years. After age 16, women should have a CBE every year. Starting at age 53, women should consider having a mammogram (breast X-ray test) every year. Women who have a family history of breast cancer should talk to their health care provider about genetic screening. Women at a high risk of breast cancer should talk to their health care providers about having an MRI and a mammogram every year.  Breast cancer gene (BRCA)-related cancer risk assessment is recommended for women who have family members with BRCA-related cancers. BRCA-related cancers include breast, ovarian, tubal, and peritoneal cancers. Having family members with these cancers may be associated with an increased risk for harmful changes (mutations) in the breast cancer genes BRCA1 and BRCA2. Results of the assessment will determine the need for genetic counseling and  BRCA1 and BRCA2 testing.  Routine pelvic exams to screen for cancer are no longer recommended for nonpregnant women who are considered low risk for cancer of the pelvic organs (ovaries, uterus, and vagina) and who do not have symptoms. Ask your health care provider if a screening pelvic exam is right for you.  If you have had past treatment for cervical cancer or a condition that could lead to cancer, you need Pap tests and screening for cancer for at least 20 years after your treatment. If Pap tests have been discontinued, your risk factors (such as having a new sexual partner) need to be reassessed to determine if screening should be resumed. Some women have medical problems that increase the chance of getting cervical cancer. In these cases, your health care provider may recommend more frequent screening and Pap tests.  The HPV test is an additional test that may be used for cervical cancer screening. The HPV test looks for the virus that can cause the cell changes on the cervix. The cells collected during the Pap test can be  tested for HPV. The HPV test could be used to screen women aged 30 years and older, and should be used in women of any age who have unclear Pap test results. After the age of 30, women should have HPV testing at the same frequency as a Pap test.  Colorectal cancer can be detected and often prevented. Most routine colorectal cancer screening begins at the age of 50 years and continues through age 75 years. However, your health care provider may recommend screening at an earlier age if you have risk factors for colon cancer. On a yearly basis, your health care provider may provide home test kits to check for hidden blood in the stool. Use of a small camera at the end of a tube, to directly examine the colon (sigmoidoscopy or colonoscopy), can detect the earliest forms of colorectal cancer. Talk to your health care provider about this at age 50, when routine screening begins. Direct  exam of the colon should be repeated every 5-10 years through age 75 years, unless early forms of pre-cancerous polyps or small growths are found.  People who are at an increased risk for hepatitis B should be screened for this virus. You are considered at high risk for hepatitis B if:  You were born in a country where hepatitis B occurs often. Talk with your health care provider about which countries are considered high risk.  Your parents were born in a high-risk country and you have not received a shot to protect against hepatitis B (hepatitis B vaccine).  You have HIV or AIDS.  You use needles to inject street drugs.  You live with, or have sex with, someone who has hepatitis B.  You get hemodialysis treatment.  You take certain medicines for conditions like cancer, organ transplantation, and autoimmune conditions.  Hepatitis C blood testing is recommended for all people born from 1945 through 1965 and any individual with known risks for hepatitis C.  Practice safe sex. Use condoms and avoid high-risk sexual practices to reduce the spread of sexually transmitted infections (STIs). STIs include gonorrhea, chlamydia, syphilis, trichomonas, herpes, HPV, and human immunodeficiency virus (HIV). Herpes, HIV, and HPV are viral illnesses that have no cure. They can result in disability, cancer, and death.  You should be screened for sexually transmitted illnesses (STIs) including gonorrhea and chlamydia if:  You are sexually active and are younger than 24 years.  You are older than 24 years and your health care provider tells you that you are at risk for this type of infection.  Your sexual activity has changed since you were last screened and you are at an increased risk for chlamydia or gonorrhea. Ask your health care provider if you are at risk.  If you are at risk of being infected with HIV, it is recommended that you take a prescription medicine daily to prevent HIV infection. This is  called preexposure prophylaxis (PrEP). You are considered at risk if:  You are a heterosexual woman, are sexually active, and are at increased risk for HIV infection.  You take drugs by injection.  You are sexually active with a partner who has HIV.  Talk with your health care provider about whether you are at high risk of being infected with HIV. If you choose to begin PrEP, you should first be tested for HIV. You should then be tested every 3 months for as long as you are taking PrEP.  Osteoporosis is a disease in which the bones lose minerals and strength   with aging. This can result in serious bone fractures or breaks. The risk of osteoporosis can be identified using a bone density scan. Women ages 65 years and over and women at risk for fractures or osteoporosis should discuss screening with their health care providers. Ask your health care provider whether you should take a calcium supplement or vitamin D to reduce the rate of osteoporosis.  Menopause can be associated with physical symptoms and risks. Hormone replacement therapy is available to decrease symptoms and risks. You should talk to your health care provider about whether hormone replacement therapy is right for you.  Use sunscreen. Apply sunscreen liberally and repeatedly throughout the day. You should seek shade when your shadow is shorter than you. Protect yourself by wearing long sleeves, pants, a wide-brimmed hat, and sunglasses year round, whenever you are outdoors.  Once a month, do a whole body skin exam, using a mirror to look at the skin on your back. Tell your health care provider of new moles, moles that have irregular borders, moles that are larger than a pencil eraser, or moles that have changed in shape or color.  Stay current with required vaccines (immunizations).  Influenza vaccine. All adults should be immunized every year.  Tetanus, diphtheria, and acellular pertussis (Td, Tdap) vaccine. Pregnant women should  receive 1 dose of Tdap vaccine during each pregnancy. The dose should be obtained regardless of the length of time since the last dose. Immunization is preferred during the 27th-36th week of gestation. An adult who has not previously received Tdap or who does not know her vaccine status should receive 1 dose of Tdap. This initial dose should be followed by tetanus and diphtheria toxoids (Td) booster doses every 10 years. Adults with an unknown or incomplete history of completing a 3-dose immunization series with Td-containing vaccines should begin or complete a primary immunization series including a Tdap dose. Adults should receive a Td booster every 10 years.  Varicella vaccine. An adult without evidence of immunity to varicella should receive 2 doses or a second dose if she has previously received 1 dose. Pregnant females who do not have evidence of immunity should receive the first dose after pregnancy. This first dose should be obtained before leaving the health care facility. The second dose should be obtained 4-8 weeks after the first dose.  Human papillomavirus (HPV) vaccine. Females aged 13-26 years who have not received the vaccine previously should obtain the 3-dose series. The vaccine is not recommended for use in pregnant females. However, pregnancy testing is not needed before receiving a dose. If a female is found to be pregnant after receiving a dose, no treatment is needed. In that case, the remaining doses should be delayed until after the pregnancy. Immunization is recommended for any person with an immunocompromised condition through the age of 26 years if she did not get any or all doses earlier. During the 3-dose series, the second dose should be obtained 4-8 weeks after the first dose. The third dose should be obtained 24 weeks after the first dose and 16 weeks after the second dose.  Zoster vaccine. One dose is recommended for adults aged 60 years or older unless certain conditions are  present.  Measles, mumps, and rubella (MMR) vaccine. Adults born before 1957 generally are considered immune to measles and mumps. Adults born in 1957 or later should have 1 or more doses of MMR vaccine unless there is a contraindication to the vaccine or there is laboratory evidence of immunity to   each of the three diseases. A routine second dose of MMR vaccine should be obtained at least 28 days after the first dose for students attending postsecondary schools, health care workers, or international travelers. People who received inactivated measles vaccine or an unknown type of measles vaccine during 1963-1967 should receive 2 doses of MMR vaccine. People who received inactivated mumps vaccine or an unknown type of mumps vaccine before 1979 and are at high risk for mumps infection should consider immunization with 2 doses of MMR vaccine. For females of childbearing age, rubella immunity should be determined. If there is no evidence of immunity, females who are not pregnant should be vaccinated. If there is no evidence of immunity, females who are pregnant should delay immunization until after pregnancy. Unvaccinated health care workers born before 1957 who lack laboratory evidence of measles, mumps, or rubella immunity or laboratory confirmation of disease should consider measles and mumps immunization with 2 doses of MMR vaccine or rubella immunization with 1 dose of MMR vaccine.  Pneumococcal 13-valent conjugate (PCV13) vaccine. When indicated, a person who is uncertain of her immunization history and has no record of immunization should receive the PCV13 vaccine. An adult aged 19 years or older who has certain medical conditions and has not been previously immunized should receive 1 dose of PCV13 vaccine. This PCV13 should be followed with a dose of pneumococcal polysaccharide (PPSV23) vaccine. The PPSV23 vaccine dose should be obtained at least 8 weeks after the dose of PCV13 vaccine. An adult aged 19  years or older who has certain medical conditions and previously received 1 or more doses of PPSV23 vaccine should receive 1 dose of PCV13. The PCV13 vaccine dose should be obtained 1 or more years after the last PPSV23 vaccine dose.  Pneumococcal polysaccharide (PPSV23) vaccine. When PCV13 is also indicated, PCV13 should be obtained first. All adults aged 65 years and older should be immunized. An adult younger than age 65 years who has certain medical conditions should be immunized. Any person who resides in a nursing home or long-term care facility should be immunized. An adult smoker should be immunized. People with an immunocompromised condition and certain other conditions should receive both PCV13 and PPSV23 vaccines. People with human immunodeficiency virus (HIV) infection should be immunized as soon as possible after diagnosis. Immunization during chemotherapy or radiation therapy should be avoided. Routine use of PPSV23 vaccine is not recommended for American Indians, [place] Natives, or people younger than 65 years unless there are medical conditions that require PPSV23 vaccine. When indicated, people who have unknown immunization and have no record of immunization should receive PPSV23 vaccine. One-time revaccination 5 years after the first dose of PPSV23 is recommended for people aged 19-64 years who have chronic kidney failure, nephrotic syndrome, asplenia, or immunocompromised conditions. People who received 1-2 doses of PPSV23 before age 65 years should receive another dose of PPSV23 vaccine at age 65 years or later if at least 5 years have passed since the previous dose. Doses of PPSV23 are not needed for people immunized with PPSV23 at or after age 65 years.  Meningococcal vaccine. Adults with asplenia or persistent complement component deficiencies should receive 2 doses of quadrivalent meningococcal conjugate (MenACWY-D) vaccine. The doses should be obtained at least 2 months apart.  Microbiologists working with certain meningococcal bacteria, military recruits, people at risk during an outbreak, and people who travel to or live in countries with a high rate of meningitis should be immunized. A first-year college student up through age   21 years who is living in a residence hall should receive a dose if she did not receive a dose on or after her 16th birthday. Adults who have certain high-risk conditions should receive one or more doses of vaccine.  Hepatitis A vaccine. Adults who wish to be protected from this disease, have certain high-risk conditions, work with hepatitis A-infected animals, work in hepatitis A research labs, or travel to or work in countries with a high rate of hepatitis A should be immunized. Adults who were previously unvaccinated and who anticipate close contact with an international adoptee during the first 60 days after arrival in the [place] from a country with a high rate of hepatitis A should be immunized.  Hepatitis B vaccine. Adults who wish to be protected from this disease, have certain high-risk conditions, may be exposed to blood or other infectious body fluids, are household contacts or sex partners of hepatitis B positive people, are clients or workers in certain care facilities, or travel to or work in countries with a high rate of hepatitis B should be immunized.  Haemophilus influenzae type b (Hib) vaccine. A previously unvaccinated person with asplenia or sickle cell disease or having a scheduled splenectomy should receive 1 dose of Hib vaccine. Regardless of previous immunization, a recipient of a hematopoietic stem cell transplant should receive a 3-dose series 6-12 months after her successful transplant. Hib vaccine is not recommended for adults with HIV infection. Preventive Services / Frequency Ages 64 to 68 years  Blood pressure check.** / Every 1 to 2 years.  Lipid and cholesterol check.** / Every 5 years beginning at age  22.  Clinical breast exam.** / Every 3 years for women in their 88s and 53s.  BRCA-related cancer risk assessment.** / For women who have family members with a BRCA-related cancer (breast, ovarian, tubal, or peritoneal cancers).  Pap test.** / Every 2 years from ages 90 through 51. Every 3 years starting at age 21 through age 56 or 3 with a history of 3 consecutive normal Pap tests.  HPV screening.** / Every 3 years from ages 24 through ages 1 to 46 with a history of 3 consecutive normal Pap tests.  Hepatitis C blood test.** / For any individual with known risks for hepatitis C.  Skin self-exam. / Monthly.  Influenza vaccine. / Every year.  Tetanus, diphtheria, and acellular pertussis (Tdap, Td) vaccine.** / Consult your health care provider. Pregnant women should receive 1 dose of Tdap vaccine during each pregnancy. 1 dose of Td every 10 years.  Varicella vaccine.** / Consult your health care provider. Pregnant females who do not have evidence of immunity should receive the first dose after pregnancy.  HPV vaccine. / 3 doses over 6 months, if 72 and younger. The vaccine is not recommended for use in pregnant females. However, pregnancy testing is not needed before receiving a dose.  Measles, mumps, rubella (MMR) vaccine.** / You need at least 1 dose of MMR if you were born in 1957 or later. You may also need a 2nd dose. For females of childbearing age, rubella immunity should be determined. If there is no evidence of immunity, females who are not pregnant should be vaccinated. If there is no evidence of immunity, females who are pregnant should delay immunization until after pregnancy.  Pneumococcal 13-valent conjugate (PCV13) vaccine.** / Consult your health care provider.  Pneumococcal polysaccharide (PPSV23) vaccine.** / 1 to 2 doses if you smoke cigarettes or if you have certain conditions.  Meningococcal vaccine.** /  1 dose if you are age 19 to 21 years and a first-year college  student living in a residence hall, or have one of several medical conditions, you need to get vaccinated against meningococcal disease. You may also need additional booster doses.  Hepatitis A vaccine.** / Consult your health care provider.  Hepatitis B vaccine.** / Consult your health care provider.  Haemophilus influenzae type b (Hib) vaccine.** / Consult your health care provider. Ages 40 to 64 years  Blood pressure check.** / Every 1 to 2 years.  Lipid and cholesterol check.** / Every 5 years beginning at age 20 years.  Lung cancer screening. / Every year if you are aged 55-80 years and have a 30-pack-year history of smoking and currently smoke or have quit within the past 15 years. Yearly screening is stopped once you have quit smoking for at least 15 years or develop a health problem that would prevent you from having lung cancer treatment.  Clinical breast exam.** / Every year after age 40 years.  BRCA-related cancer risk assessment.** / For women who have family members with a BRCA-related cancer (breast, ovarian, tubal, or peritoneal cancers).  Mammogram.** / Every year beginning at age 40 years and continuing for as long as you are in good health. Consult with your health care provider.  Pap test.** / Every 3 years starting at age 30 years through age 65 or 70 years with a history of 3 consecutive normal Pap tests.  HPV screening.** / Every 3 years from ages 30 years through ages 65 to 70 years with a history of 3 consecutive normal Pap tests.  Fecal occult blood test (FOBT) of stool. / Every year beginning at age 50 years and continuing until age 75 years. You may not need to do this test if you get a colonoscopy every 10 years.  Flexible sigmoidoscopy or colonoscopy.** / Every 5 years for a flexible sigmoidoscopy or every 10 years for a colonoscopy beginning at age 50 years and continuing until age 75 years.  Hepatitis C blood test.** / For all people born from 1945 through  1965 and any individual with known risks for hepatitis C.  Skin self-exam. / Monthly.  Influenza vaccine. / Every year.  Tetanus, diphtheria, and acellular pertussis (Tdap/Td) vaccine.** / Consult your health care provider. Pregnant women should receive 1 dose of Tdap vaccine during each pregnancy. 1 dose of Td every 10 years.  Varicella vaccine.** / Consult your health care provider. Pregnant females who do not have evidence of immunity should receive the first dose after pregnancy.  Zoster vaccine.** / 1 dose for adults aged 60 years or older.  Measles, mumps, rubella (MMR) vaccine.** / You need at least 1 dose of MMR if you were born in 1957 or later. You may also need a 2nd dose. For females of childbearing age, rubella immunity should be determined. If there is no evidence of immunity, females who are not pregnant should be vaccinated. If there is no evidence of immunity, females who are pregnant should delay immunization until after pregnancy.  Pneumococcal 13-valent conjugate (PCV13) vaccine.** / Consult your health care provider.  Pneumococcal polysaccharide (PPSV23) vaccine.** / 1 to 2 doses if you smoke cigarettes or if you have certain conditions.  Meningococcal vaccine.** / Consult your health care provider.  Hepatitis A vaccine.** / Consult your health care provider.  Hepatitis B vaccine.** / Consult your health care provider.  Haemophilus influenzae type b (Hib) vaccine.** / Consult your health care provider. Ages 65   years and over  Blood pressure check.** / Every 1 to 2 years.  Lipid and cholesterol check.** / Every 5 years beginning at age 45 years.  Lung cancer screening. / Every year if you are aged 30-80 years and have a 30-pack-year history of smoking and currently smoke or have quit within the past 15 years. Yearly screening is stopped once you have quit smoking for at least 15 years or develop a health problem that would prevent you from having lung cancer  treatment.  Clinical breast exam.** / Every year after age 11 years.  BRCA-related cancer risk assessment.** / For women who have family members with a BRCA-related cancer (breast, ovarian, tubal, or peritoneal cancers).  Mammogram.** / Every year beginning at age 53 years and continuing for as long as you are in good health. Consult with your health care provider.  Pap test.** / Every 3 years starting at age 42 years through age 64 or 69 years with 3 consecutive normal Pap tests. Testing can be stopped between 65 and 70 years with 3 consecutive normal Pap tests and no abnormal Pap or HPV tests in the past 10 years.  HPV screening.** / Every 3 years from ages 10 years through ages 80 or 59 years with a history of 3 consecutive normal Pap tests. Testing can be stopped between 65 and 70 years with 3 consecutive normal Pap tests and no abnormal Pap or HPV tests in the past 10 years.  Fecal occult blood test (FOBT) of stool. / Every year beginning at age 67 years and continuing until age 41 years. You may not need to do this test if you get a colonoscopy every 10 years.  Flexible sigmoidoscopy or colonoscopy.** / Every 5 years for a flexible sigmoidoscopy or every 10 years for a colonoscopy beginning at age 7 years and continuing until age 53 years.  Hepatitis C blood test.** / For all people born from [birth year] through 1963 and any individual with known risks for hepatitis C.  Osteoporosis screening.** / A one-time screening for women ages 28 years and over and women at risk for fractures or osteoporosis.  Skin self-exam. / Monthly.  Influenza vaccine. / Every year.  Tetanus, diphtheria, and acellular pertussis (Tdap/Td) vaccine.** / 1 dose of Td every 10 years.  Varicella vaccine.** / Consult your health care provider.  Zoster vaccine.** / 1 dose for adults aged 58 years or older.  Pneumococcal 13-valent conjugate (PCV13) vaccine.** / Consult your health care provider.  Pneumococcal  polysaccharide (PPSV23) vaccine.** / 1 dose for all adults aged 44 years and older.  Meningococcal vaccine.** / Consult your health care provider.  Hepatitis A vaccine.** / Consult your health care provider.  Hepatitis B vaccine.** / Consult your health care provider.  Haemophilus influenzae type b (Hib) vaccine.** / Consult your health care provider. ** Family history and personal history of risk and conditions may change your health care provider's recommendations. Document Released: 11/01/2001 Document Revised: 01/20/2014 Document Reviewed: 01/31/2011 [facility] Patient Information 2015 [place], [place]. This information is not intended to replace advice given to you by your health care provider. Make sure you discuss any questions you have with your health care provider. Back Pain, Adult Low back pain is very common. About 1 in 5 people have back pain.The cause of low back pain is rarely dangerous. The pain often gets better over time.About half of people with a sudden onset of back pain feel better in just 2 weeks. About 8 in 10 people feel  better by 6 weeks.  CAUSES Some common causes of back pain include:  Strain of the muscles or ligaments supporting the spine.  Wear and tear (degeneration) of the spinal discs.  Arthritis.  Direct injury to the back. DIAGNOSIS Most of the time, the direct cause of low back pain is not known.However, back pain can be treated effectively even when the exact cause of the pain is unknown.Answering your caregiver's questions about your overall health and symptoms is one of the most accurate ways to make sure the cause of your pain is not dangerous. If your caregiver needs more information, he or she may order lab work or imaging tests (X-rays or MRIs).However, even if imaging tests show changes in your back, this usually does not require surgery. HOME CARE INSTRUCTIONS For many people, back pain returns.Since low back pain is rarely dangerous, it is  often a condition that people can learn to [facility] their own.   Remain active. It is stressful on the back to sit or stand in one place. Do not sit, drive, or stand in one place for more than 30 minutes at a time. Take short walks on level surfaces as soon as pain allows.Try to increase the length of time you walk each day.  Do not stay in bed.Resting more than 1 or 2 days can delay your recovery.  Do not avoid exercise or work.Your body is made to move.It is not dangerous to be active, even though your back may hurt.Your back will likely heal faster if you return to being active before your pain is gone.  Pay attention to your body when you bend and lift. Many people have less discomfortwhen lifting if they bend their knees, keep the load close to their bodies,and avoid twisting. Often, the most comfortable positions are those that put less stress on your recovering back.  Find a comfortable position to sleep. Use a firm mattress and lie on your side with your knees slightly bent. If you lie on your back, put a pillow under your knees.  Only take over-the-counter or prescription medicines as directed by your caregiver. Over-the-counter medicines to reduce pain and inflammation are often the most helpful.Your caregiver may prescribe muscle relaxant drugs.These medicines help dull your pain so you can more quickly return to your normal activities and healthy exercise.  Put ice on the injured area.  Put ice in a plastic bag.  Place a towel between your skin and the bag.  Leave the ice on for 15-20 minutes, 03-04 times a day for the first 2 to 3 days. After that, ice and heat may be alternated to reduce pain and spasms.  Ask your caregiver about trying back exercises and gentle massage. This may be of some benefit.  Avoid feeling anxious or stressed.Stress increases muscle tension and can worsen back pain.It is important to recognize when you are anxious or stressed and learn ways  to manage it.Exercise is a great option. SEEK MEDICAL CARE IF:  You have pain that is not relieved with rest or medicine.  You have pain that does not improve in 1 week.  You have new symptoms.  You are generally not feeling well. SEEK IMMEDIATE MEDICAL CARE IF:   You have pain that radiates from your back into your legs.  You develop new bowel or bladder control problems.  You have unusual weakness or numbness in your arms or legs.  You develop nausea or vomiting.  You develop abdominal pain.  You feel faint.  Document Released: 09/05/2005 Document Revised: 03/06/2012 Document Reviewed: 01/07/2014 [facility] Patient Information 2015 [place], [place]. This information is not intended to replace advice given to you by your health care provider. Make sure you discuss any questions you have with your health care provider.

## 2014-06-06 ENCOUNTER — Encounter: Payer: Self-pay | Admitting: *Deleted

## 2014-06-06 ENCOUNTER — Other Ambulatory Visit: Payer: Self-pay | Admitting: *Deleted

## 2014-06-06 ENCOUNTER — Other Ambulatory Visit: Payer: Self-pay

## 2014-06-06 ENCOUNTER — Encounter: Payer: Self-pay | Admitting: Internal Medicine

## 2014-06-06 MED ORDER — VITAMIN D (ERGOCALCIFEROL) 1.25 MG (50000 UNIT) PO CAPS: 50000.0000 [IU] | ORAL_CAPSULE | ORAL | Status: DC

## 2014-06-06 NOTE — Assessment & Plan Note (Signed)
She will start Vit D replacement therapy

## 2014-06-06 NOTE — Assessment & Plan Note (Signed)
She is due for a DEXA scan 

## 2014-06-06 NOTE — Assessment & Plan Note (Addendum)

## 2014-06-06 NOTE — Assessment & Plan Note (Signed)
The xray shows DDD but no fracture

## 2014-06-06 NOTE — Assessment & Plan Note (Signed)
She has good rate control Will cont xarelto

## 2014-06-06 NOTE — Assessment & Plan Note (Addendum)
Her BP is well controlled Her lytes and renal function are stable 

## 2014-06-09 ENCOUNTER — Ambulatory Visit (INDEPENDENT_AMBULATORY_CARE_PROVIDER_SITE_OTHER): Payer: Medicare Other | Admitting: Podiatry

## 2014-06-09 ENCOUNTER — Encounter: Payer: Self-pay | Admitting: Podiatry

## 2014-06-09 ENCOUNTER — Ambulatory Visit (INDEPENDENT_AMBULATORY_CARE_PROVIDER_SITE_OTHER): Payer: Medicare Other

## 2014-06-09 ENCOUNTER — Ambulatory Visit: Payer: Medicare Other | Admitting: Cardiology

## 2014-06-09 VITALS — BP 140/72 | HR 81 | Resp 18

## 2014-06-09 DIAGNOSIS — R52 Pain, unspecified: Secondary | ICD-10-CM

## 2014-06-09 DIAGNOSIS — M7750 Other enthesopathy of unspecified foot: Secondary | ICD-10-CM

## 2014-06-09 DIAGNOSIS — L84 Corns and callosities: Secondary | ICD-10-CM

## 2014-06-09 NOTE — Progress Notes (Signed)
Subjective:     Patient ID: Lori [name], female   DOB: [birth year], 80 y.o.   MRN: [id number]  HPI patient presents stating I have this bone it's been bothering me on my left foot and also on my right foot the second toe has a lesion that's become more tender. I'm having trouble wearing certain shoes even though your orthotics have helped me quite a bit in my tennis shoes   Review of Systems     Objective:   Physical Exam Neurovascular status intact with muscle strength adequate in range of motion within normal limits. Patient's noted to have depression of the left arch with a prominent navicular bone secondary to previous injury with mild inflammation around it and a keratotic lesion second toe right that's painful when pressed    Assessment:     Structural abnormalities leading to problems with inflammatory condition and shoe gear choices as part of her issue    Plan:     H&P and x-rays reviewed. Today I debrided lesions and discussed possibility for new orthotics or cortisone injection which patient wants to hold off of currently reappoint for [place] to recheck

## 2014-06-09 NOTE — Progress Notes (Signed)
° °  Subjective:    Patient ID: Lori [name], female    DOB: [birth year], 80 y.o.   MRN: [id number]  HPI MY LEFT FOOT IS TURNING IN AND THIS BONE STICKS OUT AND HAS BEEN GOING ON FOR ABOUT A YEAR AND HURTS WITH SHOES AND I HAVE A CORN ON MY 5TH TOE ON MY RIGHT FOOT AND ON MY 2ND TOE ON THE RIGHT I HAVE A CORN AND I WOULD LIKE TO SEE IT GONE AND HURTS WITH SHOES    Review of Systems  HENT: Positive for sinus pressure.   All other systems reviewed and are negative.      Objective:   Physical Exam        Assessment & Plan:

## 2014-06-12 ENCOUNTER — Ambulatory Visit: Payer: Medicare Other | Admitting: Internal Medicine

## 2014-06-23 ENCOUNTER — Ambulatory Visit (INDEPENDENT_AMBULATORY_CARE_PROVIDER_SITE_OTHER): Payer: Medicare Other | Admitting: Cardiology

## 2014-06-23 VITALS — BP 150/90 | HR 80 | Ht 65.0 in | Wt 139.0 lb

## 2014-06-23 DIAGNOSIS — I4581 Long QT syndrome: Secondary | ICD-10-CM

## 2014-06-23 DIAGNOSIS — R9431 Abnormal electrocardiogram [ECG] [EKG]: Secondary | ICD-10-CM

## 2014-06-23 DIAGNOSIS — I10 Essential (primary) hypertension: Secondary | ICD-10-CM

## 2014-06-23 DIAGNOSIS — I480 Paroxysmal atrial fibrillation: Secondary | ICD-10-CM

## 2014-06-23 DIAGNOSIS — E785 Hyperlipidemia, unspecified: Secondary | ICD-10-CM

## 2014-06-23 MED ORDER — RIVAROXABAN 20 MG PO TABS
20.0000 mg | ORAL_TABLET | Freq: Every day | ORAL | Status: DC
Start: 2014-06-23 — End: 2014-11-25

## 2014-06-23 MED ORDER — PRAVASTATIN SODIUM 20 MG PO TABS: 20.0000 mg | ORAL_TABLET | Freq: Every evening | ORAL | Status: DC

## 2014-06-23 MED ORDER — DILTIAZEM HCL ER COATED BEADS 240 MG PO CP24: 240.0000 mg | ORAL_CAPSULE | Freq: Every day | ORAL | Status: DC

## 2014-06-23 MED ORDER — ALPRAZOLAM 0.25 MG PO TABS: 0.2500 mg | ORAL_TABLET | Freq: Two times a day (BID) | ORAL | Status: DC | PRN
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
## 2014-06-23 NOTE — Patient Instructions (Signed)
Increase diltiazem CD to 240mg  daily.   Start pravastatin (pravachol) 20mg  daily.   Restart Xarelto 20mg  daily.   Use Xanax 0.25mg  two times a day as needed for anxiety.  Your physician recommends that you return for a FASTING lipid profile /liver profile/BMET/CBCd in 2 months.   Your physician wants you to follow-up in: 6 months with Dr Aundra Dubin. (April 2016). You will receive a reminder letter in the mail two months in advance. If you don't receive a letter, please call our office to schedule the follow-up appointment.

## 2014-07-01 NOTE — Telephone Encounter (Signed)
error 

## 2014-07-11 ENCOUNTER — Other Ambulatory Visit: Payer: Self-pay | Admitting: Internal Medicine

## 2014-07-11 NOTE — Telephone Encounter (Signed)
Faxed script back to [facility].../lmb 

## 2014-07-31 ENCOUNTER — Telehealth: Payer: Self-pay | Admitting: Cardiology

## 2014-07-31 NOTE — Telephone Encounter (Signed)
New message   Patient calling    Referral to neurologist

## 2014-07-31 NOTE — Telephone Encounter (Signed)
Pt not at home, per husband; need to call her later.

## 2014-07-31 NOTE — Telephone Encounter (Signed)
Left pt a message to call back. 

## 2014-08-01 ENCOUNTER — Telehealth: Payer: Self-pay | Admitting: Internal Medicine

## 2014-08-01 NOTE — Telephone Encounter (Signed)
[place]

## 2014-08-01 NOTE — Telephone Encounter (Signed)
Is requesting a recommendation of a urologist at [facility].  Her urologist has retired.

## 2014-08-01 NOTE — Telephone Encounter (Signed)
Patient wants a referral for a urologist. Would not go into detail. i advised her Dr/ Aundra Dubin is out of office today, and she should really consult with her PCP.

## 2014-08-01 NOTE — Telephone Encounter (Signed)
Patient notified//lmovm  

## 2014-08-06 ENCOUNTER — Ambulatory Visit: Payer: Medicare Other | Admitting: Internal Medicine

## 2014-08-06 ENCOUNTER — Ambulatory Visit (INDEPENDENT_AMBULATORY_CARE_PROVIDER_SITE_OTHER): Payer: Medicare Other | Admitting: Internal Medicine

## 2014-08-06 ENCOUNTER — Encounter: Payer: Self-pay | Admitting: Internal Medicine

## 2014-08-06 VITALS — BP 130/60 | HR 73 | Temp 97.6°F | Ht 65.0 in | Wt 136.0 lb

## 2014-08-06 DIAGNOSIS — R197 Diarrhea, unspecified: Secondary | ICD-10-CM | POA: Insufficient documentation

## 2014-08-06 DIAGNOSIS — F418 Other specified anxiety disorders: Secondary | ICD-10-CM

## 2014-08-06 DIAGNOSIS — G4700 Insomnia, unspecified: Secondary | ICD-10-CM

## 2014-08-06 MED ORDER — MIRTAZAPINE 7.5 MG PO TABS: 7.5000 mg | ORAL_TABLET | Freq: Every day | ORAL | Status: DC

## 2014-08-06 MED ORDER — BIMATOPROST 0.01 % OP SOLN: 1.0000 [drp] | Freq: Every day | OPHTHALMIC | Status: DC

## 2014-08-06 NOTE — Progress Notes (Signed)
Pre visit review using our clinic review tool, if applicable. No additional management support is needed unless otherwise documented below in the visit note. 

## 2014-08-06 NOTE — Progress Notes (Signed)
Subjective:    Patient ID: Lori [name], female    DOB: 27-Aug-1926, 80 y.o.   MRN: [id number]  Diarrhea  This is a recurrent problem. The current episode started more than 1 month ago. The problem occurs 5 to 10 times per day. The problem has been unchanged. The stool consistency is described as watery. The patient states that diarrhea does not awaken her from sleep. Pertinent negatives include no abdominal pain, arthralgias, bloating, chills, coughing, fever, headaches, increased  flatus, myalgias, sweats, URI, vomiting or weight loss. The symptoms are aggravated by stress. There are no known risk factors. She has tried anti-motility drug for the symptoms. The treatment provided mild relief. Her past medical history is significant for irritable bowel syndrome. There is no history of bowel resection, inflammatory bowel disease, malabsorption, a recent abdominal surgery or short gut syndrome.      Review of Systems  Constitutional: Negative.  Negative for fever, chills, weight loss, diaphoresis and fatigue.  HENT: Negative.   Eyes: Negative.   Respiratory: Negative.  Negative for cough, choking, chest tightness and shortness of breath.   Cardiovascular: Negative.  Negative for chest pain, palpitations and leg swelling.  Gastrointestinal: Positive for diarrhea. Negative for nausea, vomiting, abdominal pain, constipation, blood in stool, abdominal distention, anal bleeding, rectal pain, bloating and flatus.  Endocrine: Negative.   Genitourinary: Negative.   Musculoskeletal: Negative.  Negative for myalgias, back pain, joint swelling and arthralgias.  Skin: Negative.  Negative for rash.  Allergic/Immunologic: Negative.   Neurological: Negative.  Negative for headaches.  Hematological: Negative.  Negative for adenopathy. Does not bruise/bleed easily.  Psychiatric/Behavioral: Positive for sleep disturbance, dysphoric mood and decreased concentration. Negative for suicidal ideas, hallucinations,  behavioral problems, confusion, self-injury and agitation. The patient is nervous/anxious. The patient is not hyperactive.        Objective:   Physical Exam  Constitutional: She is oriented to person, place, and time. Vital signs are normal. She appears well-developed and well-nourished.  Non-toxic appearance. She does not have a sickly appearance. She does not appear ill. No distress.  HENT:  Head: Normocephalic and atraumatic.  Mouth/Throat: Oropharynx is clear and moist. No oropharyngeal exudate.  Eyes: Conjunctivae are normal. Right eye exhibits no discharge. Left eye exhibits no discharge. No scleral icterus.  Neck: Normal range of motion. Neck supple. No JVD present. No tracheal deviation present. No thyromegaly present.  Cardiovascular: Normal rate, regular rhythm and intact distal pulses.  Exam reveals no gallop and no friction rub.   No murmur heard. Pulmonary/Chest: Effort normal and breath sounds normal. No stridor. No respiratory distress. She has no wheezes. She has no rales. She exhibits no tenderness.  Abdominal: Soft. Bowel sounds are normal. She exhibits no distension. There is no tenderness. There is no rebound and no guarding.  Musculoskeletal: Normal range of motion. She exhibits no edema or tenderness.  Lymphadenopathy:    She has no cervical adenopathy.  Neurological: She is oriented to person, place, and time.  Skin: Skin is warm and dry. No rash noted. She is not diaphoretic. No erythema. No pallor.  Psychiatric: Her speech is normal and behavior is normal. Judgment and thought content normal. Her mood appears anxious. Her affect is not angry, not blunt, not labile and not inappropriate. Cognition and memory are normal. She exhibits a depressed mood.  Vitals reviewed.     Lab Results  Component Value Date   WBC 6.1 06/05/2014   HGB 14.1 06/05/2014   HCT 42.5 06/05/2014  PLT 358.0 06/05/2014   GLUCOSE 100* 06/05/2014   CHOL 247* 06/05/2014   TRIG 78.0  06/05/2014   HDL 75.60 06/05/2014   LDLDIRECT 157.6 01/16/2012   LDLCALC 156* 06/05/2014   ALT 19 06/05/2014   AST 18 06/05/2014   NA 137 06/05/2014   K 4.0 06/05/2014   CL 101 06/05/2014   CREATININE 0.7 06/05/2014   BUN 25* 06/05/2014   CO2 29 06/05/2014   TSH 1.38 06/05/2014   INR 1.0 01/22/2014   HGBA1C * 10/04/2010    5.7 (NOTE)                                                                       According to the ADA Clinical Practice Recommendations for 2011, when HbA1c is used as a screening test:   >=6.5%   Diagnostic of Diabetes Mellitus           (if abnormal result  is confirmed)  5.7-6.4%   Increased risk of developing Diabetes Mellitus  References:Diagnosis and Classification of Diabetes Mellitus,Diabetes KPTW,6568,12(XNTZG 1):S62-S69 and Standards of Medical Care in         Diabetes - 2011,Diabetes Care,2011,34  (Suppl 1):S11-S61.      Assessment & Plan:

## 2014-08-06 NOTE — Patient Instructions (Signed)

## 2014-08-07 NOTE — Assessment & Plan Note (Signed)
Will start remeron for this

## 2014-08-07 NOTE — Assessment & Plan Note (Signed)
She will cont xanax as needed Will also start remeron to treat this

## 2014-08-07 NOTE — Assessment & Plan Note (Signed)
I think she is having a flare of IBS due to stresses but she tells me that she is having 5-6 watery BM's per day and I am concerned that there may be an infection like C. Diff I will check her CBC, lytes, and some stool studies Will start remeron to see if this helps the IBS

## 2014-08-25 ENCOUNTER — Other Ambulatory Visit: Payer: Medicare Other

## 2014-09-03 ENCOUNTER — Encounter: Payer: Self-pay | Admitting: Internal Medicine

## 2014-09-03 ENCOUNTER — Ambulatory Visit (INDEPENDENT_AMBULATORY_CARE_PROVIDER_SITE_OTHER): Payer: Medicare Other | Admitting: Internal Medicine

## 2014-09-03 VITALS — BP 138/80 | HR 89 | Ht 65.0 in | Wt 145.0 lb

## 2014-09-03 DIAGNOSIS — J45909 Unspecified asthma, uncomplicated: Secondary | ICD-10-CM | POA: Insufficient documentation

## 2014-09-03 DIAGNOSIS — J4540 Moderate persistent asthma, uncomplicated: Secondary | ICD-10-CM

## 2014-09-03 NOTE — Patient Instructions (Addendum)
#  Obstructive Lung Disease - ASTHMA  - too bad you are not feelign better with symbicort - stop symbicort  - start BREO high dose 1 puff daily at night - learn technique  - start Spiriva respimat 2 puff daily morning - learn technique - use abluterol 2 puff as needed  #Followup  - spirometry in 2 months (in office) - return to see me Dr Chase Caller in 2 months

## 2014-09-03 NOTE — Progress Notes (Signed)
Subjective:    Patient ID: Lori [name], female    DOB: 30-Dec-1925, 80 y.o.   MRN: [id number]  HPI  OV 09/03/2014  Chief Complaint  Patient presents with  . Acute Visit     Pt c/o increase in SOB with less activity and chest tightness with SOB. Pt denies cough. Pt stated she feel asleep in the bathtub. Pt stated she feels she is sleeping too much.    Follow-up obstructive lung disease-asthma suspected to be remodeled in the setting of suspected poor compliance  - This is her first follow-up visit with me after seeing Dr. Ellis Parents at [facility] in May 2015. Her visit to me has been delayed by limited compliance with follow-up and also husband's illness with parotid gland cancer. Back in May 2015 Dr. Jeannine Kitten suspected limited compliance but also remodeled asthma and instituted Symbicort, Qvar, Singulair for scheduled asthma control. If this failed she recommended glucocorticoid systemic trial. Other recommendations include - pulmonary rehabilitation, CPAP, GI evaluation for possible aspiration on the basis of CT scan showing some bronchiectasis in the basis and large volume gastroesophageal reflux with a level of clavicles on esophagogram which also showed small hiatal hernia.. She also recommended cardiology evaluation because of grade 2 diastolic dysfunction   - The pulmonary function test in May 2015 at [facility] showed a total lung capacity of 4.3 L/94%, residual volume of 3 L/145%, FVC of 1.2 L/53% and FEV1 of 0.9 L/54% with a ratio of 74. She did have anemia positive bronchodilator response with FVC improvement 15% 1.43 L almost wondered ML  - Cardiopulmonary stress test done showed a VO2 max of 104%. Dead space did not fall as expected during exercise a gradient got wide and she had multifocal PVCs. Overall reflected poor ventilatory efficiency   In any event she returns today     - She has not followed any of the instructions that Dr. Jeannine Kitten prescribed. She is not taking Qvar  or Singulair. She is not on any anticholinergic as I had prescribed originally. She is only taking Symbicort and that to only once a day occasionally. 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 She is completely exhausted doing this but does not want further help at home. She is reporting class II-III dyspnea on exertion particularly walking uphill relieved by rest without any associated wheeze or chest tightness or cough. She wants to try other inhaler therapy. She wants to feel better    - There are no other issues  has a past medical history of COPD (chronic obstructive pulmonary disease); Asthma; ALLERGIC RHINITIS; Hypertension; Pleural effusion, left; Colles' fracture of left radius; Anxiety; Back pain of thoracolumbar region; Pruritus; Diastolic dysfunction; Pulmonary nodule; IBS (irritable bowel syndrome); Bronchiectasis; Insomnia; History of pneumonia; Diverticulosis; colonic polyp; Multiple rib fractures (10/2008); and cardiovascular stress test.   reports that she quit smoking about 66 years ago. Her smoking use included Cigarettes. She has a 3 pack-year smoking history. She has never used smokeless tobacco.  Past Surgical History  Procedure Laterality Date  . Tonsillectomy    . Appendectomy    . Cholecystectomy    . Polypectomy    . Bladder suspension  2005    A-P  . Rotator cuff repair Right 2006  . Orif wrist fracture Left 2010      Dr. Burney Gauze.  . Total  knee arthroplasty Right 08/2010  . Tonsillectomy    . Colonoscopy  04/06/2012    Procedure: COLONOSCOPY;  Surgeon: Jerene Bears, MD;  Location: [facility];  Service: Gastroenterology;  Laterality: N/A;    Allergies  Allergen Reactions  . Albuterol Sulfate Palpitations  . Amoxicillin     REACTION: unspecified  . Codeine     ?? Can take Hydrocodone  . Doxycycline Diarrhea  . Levaquin  [Levofloxacin In D5w] Other (See Comments)    QT prolongation. Should avoid all flouroquinolones.   . Penicillins     Other reaction(s): Other (See Comments) Hard Lump and Rash, and Itching REACTION: causes hives  . Sulfa Antibiotics Nausea Only  . Sulfonamide Derivatives Nausea Only    Immunization History  Administered Date(s) Administered  . H1N1 09/30/2008  . Influenza Split 05/30/2012, 07/01/2013  . Influenza Whole 06/19/2009, 06/20/2011  . Influenza, High Dose Seasonal PF 07/01/2013  . Influenza,inj,Quad PF,36+ Mos 06/05/2014  . Pneumococcal Conjugate-13 08/20/2013  . Pneumococcal Polysaccharide-23 11/18/1992, 08/25/2010  . Tetanus 08/20/2013    Family History  Problem Relation Age of Onset  . Heart disease Mother   . Colon cancer Neg Hx   . Bipolar disorder Daughter   . Rheumatic fever Mother   . Pneumonia Father     Current outpatient prescriptions: bimatoprost (LUMIGAN) 0.01 % SOLN, Place 1 drop into both eyes daily., Disp: 5 mL, Rfl: 3;  budesonide-formoterol (SYMBICORT) 160-4.5 MCG/ACT inhaler, Inhale 2 puffs into the lungs daily., Disp: , Rfl: ;  dicyclomine (BENTYL) 10 MG capsule, Take 1 capsule (10 mg total) by mouth 3 (three) times daily as needed. IBS, Disp: 90 capsule, Rfl: 3 Multiple Vitamin (MULTIVITAMIN) tablet, Take by mouth daily.  , Disp: , Rfl: ;  pantoprazole (PROTONIX) 40 MG tablet, Take 40 mg by mouth daily., Disp: , Rfl: ;  valsartan-hydrochlorothiazide (DIOVAN-HCT) 160-12.5 MG per tablet, TAKE ONE TABLET BY MOUTH ONCE DAILY, Disp: 90 tablet, Rfl: 3;  Vitamin D, Ergocalciferol, (DRISDOL) 50000 UNITS CAPS capsule, Take 1 capsule (50,000 Units total) by mouth every 7 (seven) days., Disp: 12 capsule, Rfl: 3 zolpidem (AMBIEN) 5 MG tablet, TAKE ONE TABLET AT BEDTIME AS NEEDED FOR SLEEP, Disp: 30 tablet, Rfl: 5;  Aclidinium Bromide (TUDORZA PRESSAIR) 400 MCG/ACT AEPB, Inhale 1 puff into the lungs 2 (two) times daily. (Patient not taking: Reported on 09/03/2014),  Disp: 2 each, Rfl: 0 ALPRAZolam (XANAX) 0.25 MG tablet, Take 1 tablet (0.25 mg total) by mouth 2 (two) times daily as needed for anxiety. Dr Aundra Dubin will not be able to provide additional refills (Patient not taking: Reported on 09/03/2014), Disp: 30 tablet, Rfl: 0;  beclomethasone (QVAR) 80 MCG/ACT inhaler, , Disp: , Rfl:  diltiazem (CARDIZEM CD) 240 MG 24 hr capsule, Take 1 capsule (240 mg total) by mouth daily. (Patient not taking: Reported on 09/03/2014), Disp: 30 capsule, Rfl: 6;  levalbuterol (XOPENEX HFA) 45 MCG/ACT inhaler, Inhale 1-2 puffs into the lungs every 4 (four) hours as needed for wheezing. (Patient not taking: Reported on 09/03/2014), Disp: 1 Inhaler, Rfl: 3 mirtazapine (REMERON) 7.5 MG tablet, Take 1 tablet (7.5 mg total) by mouth at bedtime. (Patient not taking: Reported on 09/03/2014), Disp: 30 tablet, Rfl: 5;  pravastatin (PRAVACHOL) 20 MG tablet, Take 1 tablet (20 mg total) by mouth every evening. (Patient not taking: Reported on 09/03/2014), Disp: 30 tablet, Rfl: 3 rivaroxaban (XARELTO) 20 MG TABS tablet, Take 1 tablet (20 mg total) by mouth daily with supper. (Patient not taking: Reported on 09/03/2014), Disp: 30  tablet, Rfl: 6    Review of Systems  Constitutional: Negative for fever and unexpected weight change.  HENT: Negative for congestion, dental problem, ear pain, nosebleeds, postnasal drip, rhinorrhea, sinus pressure, sneezing, sore throat and trouble swallowing.   Eyes: Negative for redness and itching.  Respiratory: Positive for chest tightness and shortness of breath. Negative for cough and wheezing.   Cardiovascular: Negative for palpitations and leg swelling.  Gastrointestinal: Negative for nausea and vomiting.  Genitourinary: Negative for dysuria.  Musculoskeletal: Negative for joint swelling.  Skin: Negative for rash.  Neurological: Negative for headaches.  Hematological: Does not bruise/bleed easily.  Psychiatric/Behavioral: Negative for dysphoric mood. The  patient is not nervous/anxious.        Objective:   Physical Exam  Constitutional: She is oriented to person, place, and time. She appears well-developed and well-nourished. No distress.  Elegantly dressed  HENT:  Head: Normocephalic and atraumatic.  Right Ear: External ear normal.  Left Ear: External ear normal.  Mouth/Throat: Oropharynx is clear and moist. No oropharyngeal exudate.  Eyes: Conjunctivae and EOM are normal. Pupils are equal, round, and reactive to light. Right eye exhibits no discharge. Left eye exhibits no discharge. No scleral icterus.  Neck: Normal range of motion. Neck supple. No JVD present. No tracheal deviation present. No thyromegaly present.  Cardiovascular: Normal rate, regular rhythm, normal heart sounds and intact distal pulses.  Exam reveals no gallop and no friction rub.   No murmur heard. Pulmonary/Chest: Effort normal and breath sounds normal. No respiratory distress. She has no wheezes. She has no rales. She exhibits no tenderness.  Abdominal: Soft. Bowel sounds are normal. She exhibits no distension and no mass. There is no tenderness. There is no rebound and no guarding.  Musculoskeletal: Normal range of motion. She exhibits no edema or tenderness.  Lymphadenopathy:    She has no cervical adenopathy.  Neurological: She is alert and oriented to person, place, and time. She has normal reflexes. No cranial nerve deficit. She exhibits normal muscle tone. Coordination normal.  Skin: Skin is warm and dry. No rash noted. She is not diaphoretic. No erythema. No pallor.  Psychiatric: She has a normal mood and affect. Her behavior is normal. Judgment and thought content normal.  Vitals reviewed.    Filed Vitals:   09/03/14 1646  BP: 138/80  Pulse: 89  Height: 5\' 5"  (1.651 m)  Weight: 145 lb (65.772 kg)  SpO2: 93%   Estimated body mass index is 24.13 kg/(m^2) as calculated from the following:   Height as of this encounter: 5\' 5"  (1.651 m).   Weight as of  this encounter: 145 lb (65.772 kg).      Assessment & Plan:     ICD-9-CM ICD-10-CM   1. Chronic asthma, moderate persistent, uncomplicated 315.40 G86.76      #Obstructive Lung Disease - ASTHMA  - too bad you are not feelign better with symbicort - stop symbicort  - start BREO high dose 1 puff daily at night - learn technique  - start Spiriva respimat 2 puff daily morning - learn technique - use abluterol 2 puff as needed  #Followup  - spirometry in 2 months (in office) - return to see me Dr Chase Caller in 2 months    Dr. Brand Males, M.D., [facility].C.P Pulmonary and Critical Care Medicine Staff Physician [facility] Pager: [phone], If no answer or between  15:00h - 7:00h: call [phone]  09/03/2014 5:22 PM

## 2014-09-16 ENCOUNTER — Other Ambulatory Visit: Payer: Self-pay | Admitting: Internal Medicine

## 2014-09-16 DIAGNOSIS — Z1231 Encounter for screening mammogram for malignant neoplasm of breast: Secondary | ICD-10-CM

## 2014-09-18 ENCOUNTER — Ambulatory Visit (HOSPITAL_COMMUNITY): Admission: RE | Admit: 2014-09-18 | Payer: Medicare Other | Source: Ambulatory Visit

## 2014-09-25 ENCOUNTER — Telehealth: Payer: Self-pay | Admitting: Internal Medicine

## 2014-09-25 MED ORDER — TIOTROPIUM BROMIDE MONOHYDRATE 2.5 MCG/ACT IN AERS
2.0000 | INHALATION_SPRAY | Freq: Every day | RESPIRATORY_TRACT | Status: DC
Start: 2014-09-25 — End: 2014-12-11

## 2014-09-25 MED ORDER — FLUTICASONE FUROATE-VILANTEROL 200-25 MCG/INH IN AEPB: 1.0000 | INHALATION_SPRAY | Freq: Every day | RESPIRATORY_TRACT | Status: DC

## 2014-09-25 NOTE — Telephone Encounter (Signed)
Called and spoke to pt. Pt is requesting a refill of spiriva respimat and high dose breo. Rx sent to preferred pharmacy. Pt verbalized understanding and denied any further questions or concerns at this time.

## 2014-10-07 DIAGNOSIS — L720 Epidermal cyst: Secondary | ICD-10-CM | POA: Diagnosis not present

## 2014-10-13 ENCOUNTER — Other Ambulatory Visit: Payer: Self-pay

## 2014-10-13 ENCOUNTER — Ambulatory Visit (HOSPITAL_COMMUNITY)
Admission: RE | Admit: 2014-10-13 | Discharge: 2014-10-13 | Disposition: A | Discharge status: Home / Self Care | Payer: Medicare Other | Source: Ambulatory Visit | Attending: Internal Medicine | Admitting: Internal Medicine

## 2014-10-13 ENCOUNTER — Ambulatory Visit (HOSPITAL_COMMUNITY): Payer: Medicare Other

## 2014-10-13 DIAGNOSIS — Z1231 Encounter for screening mammogram for malignant neoplasm of breast: Secondary | ICD-10-CM

## 2014-10-13 DIAGNOSIS — Z139 Encounter for screening, unspecified: Secondary | ICD-10-CM

## 2014-10-15 ENCOUNTER — Other Ambulatory Visit: Payer: Self-pay

## 2014-10-15 ENCOUNTER — Ambulatory Visit
Admission: RE | Admit: 2014-10-15 | Discharge: 2014-10-15 | Disposition: A | Discharge status: Home / Self Care | Payer: Medicare Other | Source: Ambulatory Visit

## 2014-10-15 DIAGNOSIS — Z1231 Encounter for screening mammogram for malignant neoplasm of breast: Secondary | ICD-10-CM

## 2014-10-16 ENCOUNTER — Ambulatory Visit: Payer: 59

## 2014-10-16 LAB — HM MAMMOGRAPHY: HM Mammogram: NORMAL

## 2014-10-21 DIAGNOSIS — H01004 Unspecified blepharitis left upper eyelid: Secondary | ICD-10-CM | POA: Diagnosis not present

## 2014-10-21 DIAGNOSIS — R51 Headache: Secondary | ICD-10-CM | POA: Diagnosis not present

## 2014-10-21 DIAGNOSIS — H5213 Myopia, bilateral: Secondary | ICD-10-CM | POA: Diagnosis not present

## 2014-10-21 DIAGNOSIS — H01001 Unspecified blepharitis right upper eyelid: Secondary | ICD-10-CM | POA: Diagnosis not present

## 2014-11-10 ENCOUNTER — Ambulatory Visit: Payer: Medicare Other | Admitting: Internal Medicine

## 2014-11-13 ENCOUNTER — Ambulatory Visit (INDEPENDENT_AMBULATORY_CARE_PROVIDER_SITE_OTHER): Payer: Medicare Other | Admitting: Podiatry

## 2014-11-13 ENCOUNTER — Encounter: Payer: Self-pay | Admitting: Podiatry

## 2014-11-13 VITALS — BP 152/84 | HR 82 | Resp 12

## 2014-11-13 DIAGNOSIS — M779 Enthesopathy, unspecified: Secondary | ICD-10-CM

## 2014-11-13 DIAGNOSIS — M2042 Other hammer toe(s) (acquired), left foot: Secondary | ICD-10-CM

## 2014-11-13 MED ORDER — TRIAMCINOLONE ACETONIDE 10 MG/ML IJ SUSP
10.0000 mg | Freq: Once | INTRAMUSCULAR | Status: AC
  Administered 2014-11-13: 10 mg

## 2014-11-14 NOTE — Progress Notes (Signed)
Subjective:     Patient ID: Lori [name], female   DOB: October 26, 1925, 81 y.o.   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 Reappoint as needed may require surgery at one point

## 2014-11-22 ENCOUNTER — Encounter (HOSPITAL_COMMUNITY): Payer: Self-pay | Admitting: Emergency Medicine

## 2014-11-22 ENCOUNTER — Emergency Department (HOSPITAL_COMMUNITY): Payer: Medicare Other

## 2014-11-22 ENCOUNTER — Emergency Department (HOSPITAL_COMMUNITY)
Admission: EM | Admit: 2014-11-22 | Discharge: 2014-11-22 | Disposition: A | Discharge status: Home / Self Care | Payer: Medicare Other | Attending: Emergency Medicine | Admitting: Emergency Medicine

## 2014-11-22 DIAGNOSIS — Z8781 Personal history of (healed) traumatic fracture: Secondary | ICD-10-CM | POA: Insufficient documentation

## 2014-11-22 DIAGNOSIS — J449 Chronic obstructive pulmonary disease, unspecified: Secondary | ICD-10-CM | POA: Insufficient documentation

## 2014-11-22 DIAGNOSIS — Z7901 Long term (current) use of anticoagulants: Secondary | ICD-10-CM | POA: Diagnosis not present

## 2014-11-22 DIAGNOSIS — Z7951 Long term (current) use of inhaled steroids: Secondary | ICD-10-CM | POA: Insufficient documentation

## 2014-11-22 DIAGNOSIS — R42 Dizziness and giddiness: Secondary | ICD-10-CM | POA: Diagnosis not present

## 2014-11-22 DIAGNOSIS — Z880 Allergy status to penicillin: Secondary | ICD-10-CM | POA: Insufficient documentation

## 2014-11-22 DIAGNOSIS — Z8701 Personal history of pneumonia (recurrent): Secondary | ICD-10-CM | POA: Diagnosis not present

## 2014-11-22 DIAGNOSIS — Z8719 Personal history of other diseases of the digestive system: Secondary | ICD-10-CM | POA: Diagnosis not present

## 2014-11-22 DIAGNOSIS — I159 Secondary hypertension, unspecified: Secondary | ICD-10-CM | POA: Diagnosis not present

## 2014-11-22 DIAGNOSIS — Z9114 Patient's other noncompliance with medication regimen: Secondary | ICD-10-CM | POA: Insufficient documentation

## 2014-11-22 DIAGNOSIS — N390 Urinary tract infection, site not specified: Secondary | ICD-10-CM | POA: Diagnosis not present

## 2014-11-22 DIAGNOSIS — Z87891 Personal history of nicotine dependence: Secondary | ICD-10-CM | POA: Diagnosis not present

## 2014-11-22 DIAGNOSIS — I519 Heart disease, unspecified: Secondary | ICD-10-CM | POA: Diagnosis not present

## 2014-11-22 DIAGNOSIS — Z79899 Other long term (current) drug therapy: Secondary | ICD-10-CM | POA: Diagnosis not present

## 2014-11-22 DIAGNOSIS — Z86010 Personal history of colonic polyps: Secondary | ICD-10-CM | POA: Diagnosis not present

## 2014-11-22 DIAGNOSIS — I6782 Cerebral ischemia: Secondary | ICD-10-CM | POA: Diagnosis not present

## 2014-11-22 DIAGNOSIS — G4700 Insomnia, unspecified: Secondary | ICD-10-CM | POA: Diagnosis not present

## 2014-11-22 DIAGNOSIS — B9689 Other specified bacterial agents as the cause of diseases classified elsewhere: Secondary | ICD-10-CM | POA: Diagnosis not present

## 2014-11-22 LAB — I-STAT CHEM 8, ED
BUN: 30 mg/dL — AB (ref 6–23)
CALCIUM ION: 1.14 mmol/L (ref 1.13–1.30)
Chloride: 106 mmol/L (ref 96–112)
Creatinine, Ser: 0.6 mg/dL (ref 0.50–1.10)
Glucose, Bld: 141 mg/dL — ABNORMAL HIGH (ref 70–99)
HCT: 39 % (ref 36.0–46.0)
HEMOGLOBIN: 13.3 g/dL (ref 12.0–15.0)
Potassium: 4.3 mmol/L (ref 3.5–5.1)
SODIUM: 142 mmol/L (ref 135–145)
TCO2: 25 mmol/L (ref 0–100)

## 2014-11-22 LAB — COMPREHENSIVE METABOLIC PANEL
ALT: 20 U/L (ref 0–35)
AST: 23 U/L (ref 0–37)
Albumin: 4 g/dL (ref 3.5–5.2)
Alkaline Phosphatase: 86 U/L (ref 39–117)
Anion gap: 8 (ref 5–15)
BILIRUBIN TOTAL: 0.6 mg/dL (ref 0.3–1.2)
BUN: 23 mg/dL (ref 6–23)
CHLORIDE: 108 mmol/L (ref 96–112)
CO2: 25 mmol/L (ref 19–32)
Calcium: 8.8 mg/dL (ref 8.4–10.5)
Creatinine, Ser: 0.53 mg/dL (ref 0.50–1.10)
GFR calc Af Amer: >90 mL/min (ref >90)
GFR calc non Af Amer: 84 mL/min — ABNORMAL LOW (ref >90)
Glucose, Bld: 141 mg/dL — ABNORMAL HIGH (ref 70–99)
Potassium: 3.7 mmol/L (ref 3.5–5.1)
Sodium: 141 mmol/L (ref 135–145)
TOTAL PROTEIN: 6.3 g/dL (ref 6.0–8.3)

## 2014-11-22 LAB — URINALYSIS, ROUTINE W REFLEX MICROSCOPIC
BILIRUBIN URINE: NEGATIVE
GLUCOSE, UA: NEGATIVE mg/dL
KETONES UR: NEGATIVE mg/dL
Nitrite: POSITIVE — AB
Protein, ur: NEGATIVE mg/dL
Specific Gravity, Urine: 1.021 (ref 1.005–1.030)
Urobilinogen, UA: 0.2 mg/dL (ref 0.0–1.0)
pH: 5.5 (ref 5.0–8.0)

## 2014-11-22 LAB — CBC
HCT: 42.4 % (ref 36.0–46.0)
Hemoglobin: 13.8 g/dL (ref 12.0–15.0)
MCH: 29.2 pg (ref 26.0–34.0)
MCHC: 32.5 g/dL (ref 30.0–36.0)
MCV: 89.8 fL (ref 78.0–100.0)
Platelets: 324 10*3/uL (ref 150–400)
RBC: 4.72 MIL/uL (ref 3.87–5.11)
RDW: 14 % (ref 11.5–15.5)
WBC: 5.4 10*3/uL (ref 4.0–10.5)

## 2014-11-22 LAB — I-STAT TROPONIN, ED: Troponin i, poc: 0 ng/mL (ref 0.00–0.08)

## 2014-11-22 LAB — RAPID URINE DRUG SCREEN, HOSP PERFORMED
Amphetamines: NOT DETECTED
BENZODIAZEPINES: NOT DETECTED
Barbiturates: NOT DETECTED
Cocaine: NOT DETECTED
Opiates: NOT DETECTED
Tetrahydrocannabinol: NOT DETECTED

## 2014-11-22 LAB — URINE MICROSCOPIC-ADD ON

## 2014-11-22 LAB — DIFFERENTIAL
BASOS ABS: 0 10*3/uL (ref 0.0–0.1)
Basophils Relative: 0 % (ref 0–1)
Eosinophils Absolute: 0.1 10*3/uL (ref 0.0–0.7)
Eosinophils Relative: 2 % (ref 0–5)
Lymphocytes Relative: 34 % (ref 12–46)
Lymphs Abs: 1.8 10*3/uL (ref 0.7–4.0)
Monocytes Absolute: 0.2 10*3/uL (ref 0.1–1.0)
Monocytes Relative: 5 % (ref 3–12)
NEUTROS ABS: 3.2 10*3/uL (ref 1.7–7.7)
Neutrophils Relative %: 59 % (ref 43–77)

## 2014-11-22 LAB — PROTIME-INR
INR: 0.99 (ref 0.00–1.49)
Prothrombin Time: 13.2 seconds (ref 11.6–15.2)

## 2014-11-22 LAB — ETHANOL

## 2014-11-22 LAB — APTT: APTT: 30 s (ref 24–37)

## 2014-11-22 MED ORDER — CEPHALEXIN 500 MG PO CAPS: 500.0000 mg | ORAL_CAPSULE | Freq: Four times a day (QID) | ORAL | Status: DC

## 2014-11-22 NOTE — Discharge Instructions (Signed)
Take your medicine as soon as you get home. Try to drink extra fluids such as 4-5 glasses of water each day, since you are dehydrated today. Follow-up with your doctor for checkup, next week.     Dizziness Dizziness is a common problem. It is a feeling of unsteadiness or light-headedness. You may feel like you are about to faint. Dizziness can lead to injury if you stumble or fall. A person of any age group can suffer from dizziness, but dizziness is more common in older adults. CAUSES  Dizziness can be caused by many different things, including:  Middle ear problems.  Standing for too long.  Infections.  An allergic reaction.  Aging.  An emotional response to something, such as the sight of blood.  Side effects of medicines.  Tiredness.  Problems with circulation or blood pressure.  Excessive use of alcohol or medicines, or illegal drug use.  Breathing too fast (hyperventilation).  An irregular heart rhythm (arrhythmia).  A low red blood cell count (anemia).  Pregnancy.  Vomiting, diarrhea, fever, or other illnesses that cause body fluid loss (dehydration).  Diseases or conditions such as Parkinson's disease, high blood pressure (hypertension), diabetes, and thyroid problems.  Exposure to extreme heat. DIAGNOSIS  Your health care provider will ask about your symptoms, perform a physical exam, and perform an electrocardiogram (ECG) to record the electrical activity of your heart. Your health care provider may also perform other heart or blood tests to determine the cause of your dizziness. These may include:  Transthoracic echocardiogram (TTE). During echocardiography, sound waves are used to evaluate how blood flows through your heart.  Transesophageal echocardiogram (TEE).  Cardiac monitoring. This allows your health care provider to monitor your heart rate and rhythm in real time.  Holter monitor. This is a portable device that records your heartbeat and can  help diagnose heart arrhythmias. It allows your health care provider to track your heart activity for several days if needed.  Stress tests by exercise or by giving medicine that makes the heart beat faster. TREATMENT  Treatment of dizziness depends on the cause of your symptoms and can vary greatly. HOME CARE INSTRUCTIONS   Drink enough fluids to keep your urine clear or pale yellow. This is especially important in very hot weather. In older adults, it is also important in cold weather.  Take your medicine exactly as directed if your dizziness is caused by medicines. When taking blood pressure medicines, it is especially important to get up slowly.  Rise slowly from chairs and steady yourself until you feel okay.  In the morning, first sit up on the side of the bed. When you feel okay, stand slowly while holding onto something until you know your balance is fine.  Move your legs often if you need to stand in one place for a long time. Tighten and relax your muscles in your legs while standing.  Have someone stay with you for 1-2 days if dizziness continues to be a problem. Do this until you feel you are well enough to stay alone. Have the person call your health care provider if he or she notices changes in you that are concerning.  Do not drive or use heavy machinery if you feel dizzy.  Do not drink alcohol. SEEK IMMEDIATE MEDICAL CARE IF:   Your dizziness or light-headedness gets worse.  You feel nauseous or vomit.  You have problems talking, walking, or using your arms, hands, or legs.  You feel weak.  You are  not thinking clearly or you have trouble forming sentences. It may take a friend or family member to notice this.  You have chest pain, abdominal pain, shortness of breath, or sweating.  Your vision changes.  You notice any bleeding.  You have side effects from medicine that seems to be getting worse rather than better. MAKE SURE YOU:   Understand these  instructions.  Will watch your condition.  Will get help right away if you are not doing well or get worse. Document Released: 03/01/2001 Document Revised: 09/10/2013 Document Reviewed: 03/25/2011 [facility] Patient Information 2015 [place], [place]. This information is not intended to replace advice given to you by your health care provider. Make sure you discuss any questions you have with your health care provider.  Urinary Tract Infection Urinary tract infections (UTIs) can develop anywhere along your urinary tract. Your urinary tract is your body's drainage system for removing wastes and extra water. Your urinary tract includes two kidneys, two ureters, a bladder, and a urethra. Your kidneys are a pair of bean-shaped organs. Each kidney is about the size of your fist. They are located below your ribs, one on each side of your spine. CAUSES Infections are caused by microbes, which are microscopic organisms, including fungi, viruses, and bacteria. These organisms are so small that they can only be seen through a microscope. Bacteria are the microbes that most commonly cause UTIs. SYMPTOMS  Symptoms of UTIs may vary by age and gender of the patient and by the location of the infection. Symptoms in young women typically include a frequent and intense urge to urinate and a painful, burning feeling in the bladder or urethra during urination. Older women and men are more likely to be tired, shaky, and weak and have muscle aches and abdominal pain. A fever may mean the infection is in your kidneys. Other symptoms of a kidney infection include pain in your back or sides below the ribs, nausea, and vomiting. DIAGNOSIS To diagnose a UTI, your caregiver will ask you about your symptoms. Your caregiver also will ask to provide a urine sample. The urine sample will be tested for bacteria and white blood cells. White blood cells are made by your body to help fight infection. TREATMENT  Typically, UTIs can be  treated with medication. Because most UTIs are caused by a bacterial infection, they usually can be treated with the use of antibiotics. The choice of antibiotic and length of treatment depend on your symptoms and the type of bacteria causing your infection. HOME CARE INSTRUCTIONS  If you were prescribed antibiotics, take them exactly as your caregiver instructs you. Finish the medication even if you feel better after you have only taken some of the medication.  Drink enough water and fluids to keep your urine clear or pale yellow.  Avoid caffeine, tea, and carbonated beverages. They tend to irritate your bladder.  Empty your bladder often. Avoid holding urine for long periods of time.  Empty your bladder before and after sexual intercourse.  After a bowel movement, women should cleanse from front to back. Use each tissue only once. SEEK MEDICAL CARE IF:   You have back pain.  You develop a fever.  Your symptoms do not begin to resolve within 3 days. SEEK IMMEDIATE MEDICAL CARE IF:   You have severe back pain or lower abdominal pain.  You develop chills.  You have nausea or vomiting.  You have continued burning or discomfort with urination. MAKE SURE YOU:   Understand these instructions.  Will watch your condition.  Will get help right away if you are not doing well or get worse. Document Released: 06/15/2005 Document Revised: 03/06/2012 Document Reviewed: 10/14/2011 [facility] Patient Information 2015 [place], [place]. This information is not intended to replace advice given to you by your health care provider. Make sure you discuss any questions you have with your health care provider.  Hypertension Hypertension, commonly called high blood pressure, is when the force of blood pumping through your arteries is too strong. Your arteries are the blood vessels that carry blood from your heart throughout your body. A blood pressure reading consists of a higher number over a lower  number, such as 110/72. The higher number (systolic) is the pressure inside your arteries when your heart pumps. The lower number (diastolic) is the pressure inside your arteries when your heart relaxes. Ideally you want your blood pressure below 120/80. Hypertension forces your heart to work harder to pump blood. Your arteries may become narrow or stiff. Having hypertension puts you at risk for heart disease, stroke, and other problems.  RISK FACTORS Some risk factors for high blood pressure are controllable. Others are not.  Risk factors you cannot control include:   Race. You may be at higher risk if you are African American.  Age. Risk increases with age.  Gender. Men are at higher risk than women before age 56 years. After age 43, women are at higher risk than men. Risk factors you can control include:  Not getting enough exercise or physical activity.  Being overweight.  Getting too much fat, sugar, calories, or salt in your diet.  Drinking too much alcohol. SIGNS AND SYMPTOMS Hypertension does not usually cause signs or symptoms. Extremely high blood pressure (hypertensive crisis) may cause headache, anxiety, shortness of breath, and nosebleed. DIAGNOSIS  To check if you have hypertension, your health care provider will measure your blood pressure while you are seated, with your arm held at the level of your heart. It should be measured at least twice using the same arm. Certain conditions can cause a difference in blood pressure between your right and left arms. A blood pressure reading that is higher than normal on one occasion does not mean that you need treatment. If one blood pressure reading is high, ask your health care provider about having it checked again. TREATMENT  Treating high blood pressure includes making lifestyle changes and possibly taking medicine. Living a healthy lifestyle can help lower high blood pressure. You may need to change some of your habits. Lifestyle  changes may include:  Following the DASH diet. This diet is high in fruits, vegetables, and whole grains. It is low in salt, red meat, and added sugars.  Getting at least 2 hours of brisk physical activity every week.  Losing weight if necessary.  Not smoking.  Limiting alcoholic beverages.  Learning ways to reduce stress. If lifestyle changes are not enough to get your blood pressure under control, your health care provider may prescribe medicine. You may need to take more than one. Work closely with your health care provider to understand the risks and benefits. HOME CARE INSTRUCTIONS  Have your blood pressure rechecked as directed by your health care provider.   Take medicines only as directed by your health care provider. Follow the directions carefully. Blood pressure medicines must be taken as prescribed. The medicine does not work as well when you skip doses. Skipping doses also puts you at risk for problems.   Do not smoke.  Monitor your blood pressure at home as directed by your health care provider. SEEK MEDICAL CARE IF:   You think you are having a reaction to medicines taken.  You have recurrent headaches or feel dizzy.  You have swelling in your ankles.  You have trouble with your vision. SEEK IMMEDIATE MEDICAL CARE IF:  You develop a severe headache or confusion.  You have unusual weakness, numbness, or feel faint.  You have severe chest or abdominal pain.  You vomit repeatedly.  You have trouble breathing. MAKE SURE YOU:   Understand these instructions.  Will watch your condition.  Will get help right away if you are not doing well or get worse. Document Released: 09/05/2005 Document Revised: 01/20/2014 Document Reviewed: 06/28/2013 [facility] Patient Information 2015 [place], [place]. This information is not intended to replace advice given to you by your health care provider. Make sure you discuss any questions you have with your health care  provider.

## 2014-11-22 NOTE — ED Notes (Signed)
Lori [name], EDP made aware of pt's trending BP. Acknowledgement of same, no change in POC.

## 2014-11-22 NOTE — ED Notes (Signed)
Wentz, MD at bedside.  

## 2014-11-22 NOTE — ED Notes (Signed)
Patient attempted to use the bathroom, but was unable to give urine sample.

## 2014-11-22 NOTE — ED Notes (Signed)
Pt states that when she woke up from nap around 1230pm today she had headache, blurred vision, confusion, and dizziness. 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

## 2014-11-22 NOTE — ED Provider Notes (Signed)
CSN: [id number]     Arrival date & time 11/22/14  1435 History   First MD Initiated Contact with Patient 11/22/14 1511     Chief Complaint  Patient presents with  . Dizziness  . Altered Mental Status  . Headache     (Consider location/radiation/quality/duration/timing/severity/associated sxs/prior Treatment) Patient is a 81 y.o. female presenting with dizziness, altered mental status, and headaches. The history is provided by the patient.  Dizziness Associated symptoms: headaches   Altered Mental Status Associated symptoms: headaches   Headache Associated symptoms: dizziness    Lori [name] is a 81 y.o. female presents by private vehicle for evaluation of headache and dizziness. The symptoms started after she got from her nap this afternoon, at 12:30 PM. She describes the dizziness as a feeling that her legs are wobbly. She also has this feeling in her arms. She denies recent illnesses, including fever, chills, nausea, vomiting, cough, shortness of breath, chest pain, dysuria, or change in her bowel habits. She is taking her usual medications, without relief. She's not had this problem previously. There are no other known modifying factors.   Past Medical History  Diagnosis Date  . COPD (chronic obstructive pulmonary disease)   . Asthma   . ALLERGIC RHINITIS   . Hypertension   . Pleural effusion, left   . Colles' fracture of left radius   . Anxiety   . Back pain of thoracolumbar region     right  . Pruritus   . Diastolic dysfunction   . Pulmonary nodule   . IBS (irritable bowel syndrome)   . Bronchiectasis   . Insomnia   . History of pneumonia   . Diverticulosis   . Hx of colonic polyp   . Multiple rib fractures 10/2008    bilateral  . Hx of cardiovascular stress test     a. ETT-MV 1/14: Exercised 4:16, no ECG changes, poor ex tol, ant defect c/w soft tissue atten, no ischemia, EF 75%   Past Surgical History  Procedure Laterality Date  . Tonsillectomy    .  Appendectomy    . Cholecystectomy    . Polypectomy    . Bladder suspension  2005    A-P  . Rotator cuff repair Right 2006  . Orif wrist fracture Left 2010      Dr. Burney Gauze.  . Total knee arthroplasty Right 08/2010  . Tonsillectomy    . Colonoscopy  04/06/2012    Procedure: COLONOSCOPY;  Surgeon: Jerene Bears, MD;  Location: [facility];  Service: Gastroenterology;  Laterality: N/A;   Family History  Problem Relation Age of Onset  . Heart disease Mother   . Colon cancer Neg Hx   . Bipolar disorder Daughter   . Rheumatic fever Mother   . Pneumonia Father    History  Substance Use Topics  . Smoking status: Former Smoker -- 0.30 packs/day for 10 years    Types: Cigarettes    Quit date: 09/19/1948  . Smokeless tobacco: Never Used  . Alcohol Use: No     Comment: rarely   OB History    No data available     Review of Systems  Neurological: Positive for dizziness and headaches.  All other systems reviewed and are negative.     Allergies  Albuterol sulfate; Amoxicillin; Codeine; Doxycycline; Levaquin; Penicillins; Sulfa antibiotics; and Sulfonamide derivatives  Home Medications   Prior to Admission medications   Medication Sig Start Date End Date Taking? Authorizing Provider  MAGNESIUM PO Take by mouth.  Yes Historical Provider, MD  zolpidem (AMBIEN) 5 MG tablet TAKE ONE TABLET AT BEDTIME AS NEEDED FOR SLEEP Patient taking differently: Take 1 tablet at bedtime as needed for sleep. 07/11/14  Yes Janith Lima, MD  Aclidinium Bromide (TUDORZA PRESSAIR) 400 MCG/ACT AEPB Inhale 1 puff into the lungs 2 (two) times daily. 07/03/13   Brand Males, MD  ALPRAZolam Duanne Moron) 0.25 MG tablet Take 1 tablet (0.25 mg total) by mouth 2 (two) times daily as needed for anxiety. Dr Aundra Dubin will not be able to provide additional refills Patient not taking: Reported on 11/22/2014 06/23/14   Larey Dresser, MD  beclomethasone (QVAR) 80 MCG/ACT inhaler     Historical Provider, MD   bimatoprost (LUMIGAN) 0.01 % SOLN Place 1 drop into both eyes daily. 08/06/14   Janith Lima, MD  budesonide-formoterol [facility]) 160-4.5 MCG/ACT inhaler Inhale 2 puffs into the lungs daily.    Historical Provider, MD  dicyclomine (BENTYL) 10 MG capsule Take 1 capsule (10 mg total) by mouth 3 (three) times daily as needed. IBS Patient not taking: Reported on 11/22/2014 06/05/14   Janith Lima, MD  diltiazem (CARDIZEM CD) 240 MG 24 hr capsule Take 1 capsule (240 mg total) by mouth daily. Patient not taking: Reported on 11/22/2014 06/23/14   Larey Dresser, MD  Fluticasone Furoate-Vilanterol (BREO ELLIPTA) 200-25 MCG/INH AEPB Inhale 1 puff into the lungs daily. Patient not taking: Reported on 11/22/2014 09/25/14   Brand Males, MD  levalbuterol [facility] HFA) 45 MCG/ACT inhaler Inhale 1-2 puffs into the lungs every 4 (four) hours as needed for wheezing. Patient not taking: Reported on 11/22/2014 04/24/13   Chesley Mires, MD  mirtazapine (REMERON) 7.5 MG tablet Take 1 tablet (7.5 mg total) by mouth at bedtime. Patient not taking: Reported on 11/22/2014 08/06/14   Janith Lima, MD  Multiple Vitamin (MULTIVITAMIN) tablet Take by mouth daily.      Historical Provider, MD  pantoprazole (PROTONIX) 40 MG tablet Take 40 mg by mouth daily.    Historical Provider, MD  pravastatin (PRAVACHOL) 20 MG tablet Take 1 tablet (20 mg total) by mouth every evening. 06/23/14   Larey Dresser, MD  rivaroxaban (XARELTO) 20 MG TABS tablet Take 1 tablet (20 mg total) by mouth daily with supper. Patient not taking: Reported on 11/22/2014 06/23/14   Larey Dresser, MD  Tiotropium Bromide Monohydrate (SPIRIVA RESPIMAT) 2.5 MCG/ACT AERS Inhale 2 puffs into the lungs daily. Patient not taking: Reported on 11/22/2014 09/25/14   Brand Males, MD  valsartan-hydrochlorothiazide (DIOVAN-HCT) 160-12.5 MG per tablet TAKE ONE TABLET BY MOUTH ONCE DAILY Patient not taking: Reported on 11/22/2014 11/28/13   Neena Rhymes, MD  Vitamin D,  Ergocalciferol, (DRISDOL) 50000 UNITS CAPS capsule Take 1 capsule (50,000 Units total) by mouth every 7 (seven) days. Patient not taking: Reported on 11/22/2014 06/06/14   Janith Lima, MD   BP 152/71 mmHg  Pulse 71  Temp(Src) 97.7 F (36.5 C) (Oral)  Resp 18  Ht 5\' 5"  (1.651 m)  Wt 138 lb (62.596 kg)  BMI 22.96 kg/m2  SpO2 96% Physical Exam  Constitutional: She is oriented to person, place, and time. She appears well-developed.  Elderly, frail  HENT:  Head: Normocephalic and atraumatic.  Right Ear: External ear normal.  Left Ear: External ear normal.  Eyes: Conjunctivae and EOM are normal. Pupils are equal, round, and reactive to light.  Neck: Normal range of motion and phonation normal. Neck supple.  Cardiovascular: Normal rate, regular rhythm and  normal heart sounds.   Pulmonary/Chest: Effort normal and breath sounds normal. She exhibits no bony tenderness.  Abdominal: Soft. There is no tenderness.  Musculoskeletal: Normal range of motion.  Neurological: She is alert and oriented to person, place, and time. No cranial nerve deficit or sensory deficit. She exhibits normal muscle tone. Coordination normal.  No dysarthria and aphasia or nystagmus. She walks with a slow and somewhat shuffling gait but does not exhibit ataxia.  Skin: Skin is warm, dry and intact.  Psychiatric: She has a normal mood and affect. Her behavior is normal. Judgment and thought content normal.  Nursing note and vitals reviewed.   ED Course  Procedures (including critical care time)  15:15- consideration for stroke syndrome, stroke, so screen ordered. At this time. She is not a candidate for thrombolytics because her NIH score is very low (0).  Medications - No data to display  Patient Vitals for the past 24 hrs:  BP Temp Temp src Pulse Resp SpO2 Height Weight  11/22/14 1629 (!) 219/90 mmHg - - 68 19 97 % - -  11/22/14 1451 - - - - - - 5\' 5"  (1.651 m) 138 lb (62.596 kg)  11/22/14 1450 152/71 mmHg 97.7  F (36.5 C) Oral 71 18 96 % - -    At D/C Reevaluation with update and discussion. After initial assessment and treatment, an updated evaluation reveals she states that she has not been taking her medications for 2 weeks because she is too busy taking care of her ill husband. She has her medicines at home and is committed to restarting them. She states her dizziness has improved since being in the emergency department. Findings discussed with patient and family member, all questions answered.. [place]  I-STAT CHEM 8, ED - Abnormal; Notable for the following:    BUN 30 (*)    Glucose, Bld 141 (*)    All other components within normal limits  PROTIME-INR  APTT  CBC  DIFFERENTIAL  ETHANOL  COMPREHENSIVE METABOLIC PANEL  URINE RAPID DRUG SCREEN (HOSP PERFORMED)  URINALYSIS, ROUTINE W REFLEX MICROSCOPIC  I-STAT TROPOININ, ED  I-STAT TROPOININ, ED    Imaging Review No results found.   EKG Interpretation None      MDM   Final diagnoses:  Dizziness  Urinary tract infection without hematuria, site unspecified  Noncompliance with medication regimen  Secondary hypertension, unspecified    dizziness, likely related to urinary tract infection. She is also mildly dehydrated. Hypertension is related to medical noncompliance. Doubt hypertensive urgency, instability or serious bacterial infection.  Nursing Notes Reviewed/ Care Coordinated Applicable Imaging Reviewed Interpretation of Laboratory Data incorporated into ED treatment  The patient appears reasonably screened and/or stabilized for discharge and I doubt any other medical condition or other [facility] requiring further screening, evaluation, or treatment in the ED at this time prior to discharge.  Plan: Home Medications- Keflex; Home Treatments- rest, increase fluids; return here if the recommended treatment, does not improve the symptoms; Recommended follow up- PCP check up in 7-10  days     Richarda Blade, MD 11/22/14 2350

## 2014-11-24 ENCOUNTER — Telehealth: Payer: Self-pay | Admitting: Cardiology

## 2014-11-24 LAB — URINE CULTURE
Colony Count: >=100000
SPECIAL REQUESTS: NORMAL

## 2014-11-24 NOTE — Telephone Encounter (Signed)
Busy

## 2014-11-24 NOTE — Telephone Encounter (Signed)
New message  Pt called states that she was seen in the hospital// Notes on Epic// Requests an urgent appt with Dr. Aundra Dubin. Offered next available with PA which was 03/23 at 8am or 8:30am. Pt declined OV and demanded a same day appt to follow up from ER visit on 04/24/2015. Please call back to discuss.

## 2014-11-24 NOTE — Telephone Encounter (Signed)
Follow Up  [facility] following up. Left new number. Please call back and discuss  [phone]

## 2014-11-24 NOTE — Telephone Encounter (Signed)
Mliss Sax states pt went to ED over the weekend for BP issues. Pt does not have her hospital discharge instructions, Mliss Sax states pt told her she was told to see MD tomorrow.  Pt has an appt with Dr Ronnald Ramp already scheduled for 11/24/13, 9:30 AM.  Mliss Sax advised to recommend pt keep scheduled ED follow up appt 11/25/14 with Dr Ronnald Ramp.   Mliss Sax states they are having problems with the phone system and she will let pt know recommendations.

## 2014-11-24 NOTE — Telephone Encounter (Signed)
Unable to leave voice mail at [phone] Mliss Sax, mail box full.

## 2014-11-24 NOTE — Telephone Encounter (Signed)
Follow Up  [facility] following up. Please call back and discuss.

## 2014-11-24 NOTE — Telephone Encounter (Signed)
LMTCB for pt 

## 2014-11-24 NOTE — Telephone Encounter (Signed)
Following up on this message 

## 2014-11-25 ENCOUNTER — Ambulatory Visit (INDEPENDENT_AMBULATORY_CARE_PROVIDER_SITE_OTHER): Payer: Medicare Other | Admitting: Internal Medicine

## 2014-11-25 ENCOUNTER — Encounter: Payer: Self-pay | Admitting: Internal Medicine

## 2014-11-25 VITALS — BP 140/78 | HR 84 | Temp 97.8°F | Resp 16 | Ht 65.0 in | Wt 139.0 lb

## 2014-11-25 DIAGNOSIS — I10 Essential (primary) hypertension: Secondary | ICD-10-CM

## 2014-11-25 DIAGNOSIS — J4520 Mild intermittent asthma, uncomplicated: Secondary | ICD-10-CM

## 2014-11-25 DIAGNOSIS — I480 Paroxysmal atrial fibrillation: Secondary | ICD-10-CM

## 2014-11-25 DIAGNOSIS — G4700 Insomnia, unspecified: Secondary | ICD-10-CM | POA: Diagnosis not present

## 2014-11-25 DIAGNOSIS — F418 Other specified anxiety disorders: Secondary | ICD-10-CM

## 2014-11-25 DIAGNOSIS — I481 Persistent atrial fibrillation: Secondary | ICD-10-CM | POA: Diagnosis not present

## 2014-11-25 DIAGNOSIS — J449 Chronic obstructive pulmonary disease, unspecified: Secondary | ICD-10-CM

## 2014-11-25 DIAGNOSIS — I519 Heart disease, unspecified: Secondary | ICD-10-CM

## 2014-11-25 DIAGNOSIS — I4819 Other persistent atrial fibrillation: Secondary | ICD-10-CM

## 2014-11-25 MED ORDER — MIRTAZAPINE 7.5 MG PO TABS: 7.5000 mg | ORAL_TABLET | Freq: Every day | ORAL | Status: DC

## 2014-11-25 MED ORDER — DILTIAZEM HCL ER COATED BEADS 240 MG PO CP24: 240.0000 mg | ORAL_CAPSULE | Freq: Every day | ORAL | Status: DC

## 2014-11-25 MED ORDER — VALSARTAN-HYDROCHLOROTHIAZIDE 160-12.5 MG PO TABS: ORAL_TABLET | ORAL | Status: DC

## 2014-11-25 MED ORDER — DICYCLOMINE HCL 10 MG PO CAPS: 10.0000 mg | ORAL_CAPSULE | Freq: Three times a day (TID) | ORAL | Status: DC | PRN

## 2014-11-25 NOTE — Progress Notes (Signed)
Subjective:    Patient ID: Lori [name], female    DOB: Oct 21, 1925, 81 y.o.   MRN: [id number]  Hypertension This is a chronic problem. The current episode started more than 1 year ago. The problem has been gradually improving since onset. The problem is controlled. Associated symptoms include anxiety. Pertinent negatives include no blurred vision, chest pain, headaches, malaise/fatigue, neck pain, orthopnea, palpitations, peripheral edema, PND, shortness of breath or sweats. Past treatments include calcium channel blockers, angiotensin blockers and diuretics. The current treatment provides moderate improvement. Compliance problems include psychosocial issues (she has missed a lot of doses recently).  Hypertensive end-organ damage includes heart failure (DD).      Review of Systems  Constitutional: Negative.  Negative for fever, chills, malaise/fatigue, diaphoresis, appetite change and fatigue.  HENT: Negative.   Eyes: Negative.  Negative for blurred vision.  Respiratory: Negative.  Negative for cough, choking, chest tightness, shortness of breath and stridor.   Cardiovascular: Negative.  Negative for chest pain, palpitations, orthopnea, leg swelling and PND.  Gastrointestinal: Negative.  Negative for abdominal pain.  Endocrine: Negative.   Genitourinary: Negative.  Negative for dysuria, urgency, frequency, hematuria, flank pain, decreased urine volume, difficulty urinating and dyspareunia.  Musculoskeletal: Negative.  Negative for neck pain.  Skin: Negative.  Negative for rash.  Allergic/Immunologic: Negative.   Neurological: Negative.  Negative for dizziness, tremors, syncope, light-headedness, numbness and headaches.  Hematological: Negative.  Negative for adenopathy. Does not bruise/bleed easily.  Psychiatric/Behavioral: Positive for sleep disturbance and dysphoric mood. Negative for suicidal ideas, hallucinations, behavioral problems, confusion, self-injury, decreased concentration and  agitation. The patient is nervous/anxious. The patient is not hyperactive.        Objective:   Physical Exam  Constitutional: She is oriented to person, place, and time. She appears well-developed and well-nourished. No distress.  HENT:  Head: Normocephalic and atraumatic.  Mouth/Throat: Oropharynx is clear and moist. No oropharyngeal exudate.  Eyes: Conjunctivae are normal. Right eye exhibits no discharge. Left eye exhibits no discharge. No scleral icterus.  Neck: Normal range of motion. Neck supple. No JVD present. No tracheal deviation present. No thyromegaly present.  Cardiovascular: Normal rate, normal heart sounds and intact distal pulses.  An irregularly irregular rhythm present. Exam reveals no gallop, no distant heart sounds and no friction rub.   No murmur heard. Pulmonary/Chest: Effort normal and breath sounds normal. No stridor. No respiratory distress. She has no wheezes. She has no rales. She exhibits no tenderness.  Abdominal: Soft. Bowel sounds are normal. She exhibits no distension and no mass. There is no tenderness. There is no rebound and no guarding.  Musculoskeletal: Normal range of motion. She exhibits no edema or tenderness.  Lymphadenopathy:    She has no cervical adenopathy.  Neurological: She is oriented to person, place, and time.  Skin: Skin is warm and dry. No rash noted. She is not diaphoretic. No erythema. No pallor.  Psychiatric: She has a normal mood and affect. Her behavior is normal. Judgment and thought content normal.  Vitals reviewed.    Lab Results  Component Value Date   WBC 5.4 11/22/2014   HGB 13.3 11/22/2014   HCT 39.0 11/22/2014   PLT 324 11/22/2014   GLUCOSE 141* 11/22/2014   CHOL 247* 06/05/2014   TRIG 78.0 06/05/2014   HDL 75.60 06/05/2014   LDLDIRECT 157.6 01/16/2012   LDLCALC 156* 06/05/2014   ALT 20 11/22/2014   AST 23 11/22/2014   NA 142 11/22/2014   K 4.3 11/22/2014  CL 106 11/22/2014   CREATININE 0.60 11/22/2014   BUN  30* 11/22/2014   CO2 25 11/22/2014   TSH 1.38 06/05/2014   INR 0.99 11/22/2014   HGBA1C * 10/04/2010    5.7 (NOTE)                                                                       According to the ADA Clinical Practice Recommendations for 2011, when HbA1c is used as a screening test:   >=6.5%   Diagnostic of Diabetes Mellitus           (if abnormal result  is confirmed)  5.7-6.4%   Increased risk of developing Diabetes Mellitus  References:Diagnosis and Classification of Diabetes Mellitus,Diabetes OIZT,2458,09(XIPJA 1):S62-S69 and Standards of Medical Care in         Diabetes - 2011,Diabetes Care,2011,34  (Suppl 1):S11-S61.       Assessment & Plan:

## 2014-11-25 NOTE — Patient Instructions (Signed)

## 2014-11-26 NOTE — Assessment & Plan Note (Signed)
This is well controlled with symbicort

## 2014-11-26 NOTE — Assessment & Plan Note (Signed)
This is well controlled with symbicort and tudroza

## 2014-11-26 NOTE — Assessment & Plan Note (Signed)
She was recently seen in the ER with a hypertensive urgency after not taking her meds for about 2 weeks She has restart her meds and her BP is well controlled and she feels well

## 2014-11-26 NOTE — Assessment & Plan Note (Signed)
She has good rate control She refuses to take xarelto

## 2014-12-04 ENCOUNTER — Telehealth: Payer: Self-pay | Admitting: Cardiology

## 2014-12-04 NOTE — Telephone Encounter (Signed)
Sam states that he is requesting an appointment for pt tomorrow because of irregular pulse, he states he requested an appointment for tomorrow when he called.  Sam advised I will try to arrange appt with flex PA tomorrow and call him in the morning.

## 2014-12-04 NOTE — Telephone Encounter (Signed)
New Message  Sam, Rn at [facility], calling to discuss pt's condition. Per Sam- Pt, this morning, Doubled normal dose of diavan;  pulse of 110 and irregular. Problems with BP, last reading-115/92. Please call back and discuss.

## 2014-12-05 ENCOUNTER — Ambulatory Visit (INDEPENDENT_AMBULATORY_CARE_PROVIDER_SITE_OTHER): Payer: Medicare Other | Admitting: Nurse Practitioner

## 2014-12-05 ENCOUNTER — Encounter: Payer: Self-pay | Admitting: Nurse Practitioner

## 2014-12-05 VITALS — BP 90/58 | HR 81 | Ht 65.0 in | Wt 137.0 lb

## 2014-12-05 DIAGNOSIS — N390 Urinary tract infection, site not specified: Secondary | ICD-10-CM

## 2014-12-05 DIAGNOSIS — I480 Paroxysmal atrial fibrillation: Secondary | ICD-10-CM

## 2014-12-05 DIAGNOSIS — I481 Persistent atrial fibrillation: Secondary | ICD-10-CM | POA: Diagnosis not present

## 2014-12-05 DIAGNOSIS — I4819 Other persistent atrial fibrillation: Secondary | ICD-10-CM

## 2014-12-05 LAB — URINALYSIS, ROUTINE W REFLEX MICROSCOPIC
Bilirubin Urine: NEGATIVE
Hgb urine dipstick: NEGATIVE
Ketones, ur: NEGATIVE
Leukocytes, UA: NEGATIVE
Nitrite: POSITIVE — AB
RBC / HPF: NONE SEEN (ref 0–?)
Specific Gravity, Urine: 1.015 (ref 1.000–1.030)
Total Protein, Urine: NEGATIVE
Urine Glucose: NEGATIVE
Urobilinogen, UA: 0.2 (ref 0.0–1.0)
pH: 5.5 (ref 5.0–8.0)

## 2014-12-05 MED ORDER — RIVAROXABAN 20 MG PO TABS: 20.0000 mg | ORAL_TABLET | Freq: Every day | ORAL | Status: DC

## 2014-12-05 NOTE — Patient Instructions (Addendum)
We will be checking the following labs today BMET, CBC, UA and urine culture  The urine culture will let us know about what antibiotic you should be on  We need to get an ultrasound of your heart  Restart your Xarelto 20 mg each day with your largest meal  Stop your aspirin on Sunday  See Dr. Aundra Dubin as planned in April   Call the [place] office at [phone] if you have any questions, problems or concerns.
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
## 2014-12-05 NOTE — Telephone Encounter (Signed)
Appt scheduled today at 10AM with Kathrene Alu  [facility] for Sam with appt information.

## 2014-12-05 NOTE — Telephone Encounter (Signed)
Pt notified, states she will be here at [place] to see Cecille Rubin.

## 2014-12-06 ENCOUNTER — Encounter (HOSPITAL_COMMUNITY): Payer: Self-pay | Admitting: Emergency Medicine

## 2014-12-06 ENCOUNTER — Emergency Department (HOSPITAL_COMMUNITY): Payer: Medicare Other

## 2014-12-06 ENCOUNTER — Emergency Department (HOSPITAL_COMMUNITY)
Admission: EM | Admit: 2014-12-06 | Discharge: 2014-12-06 | Disposition: A | Discharge status: Home / Self Care | Payer: Medicare Other | Attending: Emergency Medicine | Admitting: Emergency Medicine

## 2014-12-06 DIAGNOSIS — Z872 Personal history of diseases of the skin and subcutaneous tissue: Secondary | ICD-10-CM | POA: Diagnosis not present

## 2014-12-06 DIAGNOSIS — Z7951 Long term (current) use of inhaled steroids: Secondary | ICD-10-CM | POA: Diagnosis not present

## 2014-12-06 DIAGNOSIS — J441 Chronic obstructive pulmonary disease with (acute) exacerbation: Secondary | ICD-10-CM | POA: Insufficient documentation

## 2014-12-06 DIAGNOSIS — Z79899 Other long term (current) drug therapy: Secondary | ICD-10-CM | POA: Insufficient documentation

## 2014-12-06 DIAGNOSIS — I4891 Unspecified atrial fibrillation: Secondary | ICD-10-CM | POA: Diagnosis not present

## 2014-12-06 DIAGNOSIS — Z7901 Long term (current) use of anticoagulants: Secondary | ICD-10-CM | POA: Diagnosis not present

## 2014-12-06 DIAGNOSIS — F419 Anxiety disorder, unspecified: Secondary | ICD-10-CM | POA: Insufficient documentation

## 2014-12-06 DIAGNOSIS — Z8701 Personal history of pneumonia (recurrent): Secondary | ICD-10-CM | POA: Diagnosis not present

## 2014-12-06 DIAGNOSIS — I10 Essential (primary) hypertension: Secondary | ICD-10-CM | POA: Diagnosis not present

## 2014-12-06 DIAGNOSIS — Z87891 Personal history of nicotine dependence: Secondary | ICD-10-CM | POA: Insufficient documentation

## 2014-12-06 DIAGNOSIS — Z8719 Personal history of other diseases of the digestive system: Secondary | ICD-10-CM | POA: Insufficient documentation

## 2014-12-06 DIAGNOSIS — Z792 Long term (current) use of antibiotics: Secondary | ICD-10-CM | POA: Insufficient documentation

## 2014-12-06 DIAGNOSIS — Z86010 Personal history of colonic polyps: Secondary | ICD-10-CM | POA: Insufficient documentation

## 2014-12-06 DIAGNOSIS — R002 Palpitations: Secondary | ICD-10-CM | POA: Diagnosis not present

## 2014-12-06 DIAGNOSIS — Z8781 Personal history of (healed) traumatic fracture: Secondary | ICD-10-CM | POA: Insufficient documentation

## 2014-12-06 DIAGNOSIS — R0602 Shortness of breath: Secondary | ICD-10-CM | POA: Diagnosis not present

## 2014-12-06 DIAGNOSIS — G4700 Insomnia, unspecified: Secondary | ICD-10-CM | POA: Diagnosis not present

## 2014-12-06 DIAGNOSIS — R079 Chest pain, unspecified: Secondary | ICD-10-CM | POA: Diagnosis not present

## 2014-12-06 DIAGNOSIS — Z880 Allergy status to penicillin: Secondary | ICD-10-CM | POA: Diagnosis not present

## 2014-12-06 LAB — URINALYSIS, ROUTINE W REFLEX MICROSCOPIC
BILIRUBIN URINE: NEGATIVE
GLUCOSE, UA: NEGATIVE mg/dL
Hgb urine dipstick: NEGATIVE
Ketones, ur: NEGATIVE mg/dL
Leukocytes, UA: NEGATIVE
Nitrite: NEGATIVE
Protein, ur: NEGATIVE mg/dL
SPECIFIC GRAVITY, URINE: 1.017 (ref 1.005–1.030)
Urobilinogen, UA: 0.2 mg/dL (ref 0.0–1.0)
pH: 5 (ref 5.0–8.0)

## 2014-12-06 LAB — BASIC METABOLIC PANEL
Anion gap: 7 (ref 5–15)
BUN: 20 mg/dL (ref 6–23)
CO2: 27 mmol/L (ref 19–32)
Calcium: 9.4 mg/dL (ref 8.4–10.5)
Chloride: 103 mmol/L (ref 96–112)
Creatinine, Ser: 0.7 mg/dL (ref 0.50–1.10)
GFR calc Af Amer: 88 mL/min — ABNORMAL LOW (ref >90)
GFR, EST NON AFRICAN AMERICAN: 76 mL/min — AB (ref >90)
Glucose, Bld: 90 mg/dL (ref 70–99)
POTASSIUM: 4.2 mmol/L (ref 3.5–5.1)
SODIUM: 137 mmol/L (ref 135–145)

## 2014-12-06 LAB — CBC WITH DIFFERENTIAL/PLATELET
BASOS ABS: 0 10*3/uL (ref 0.0–0.1)
Basophils Relative: 0 % (ref 0–1)
Eosinophils Absolute: 0.2 10*3/uL (ref 0.0–0.7)
Eosinophils Relative: 2 % (ref 0–5)
HEMATOCRIT: 42.7 % (ref 36.0–46.0)
Hemoglobin: 14.2 g/dL (ref 12.0–15.0)
LYMPHS ABS: 2.5 10*3/uL (ref 0.7–4.0)
LYMPHS PCT: 36 % (ref 12–46)
MCH: 29.6 pg (ref 26.0–34.0)
MCHC: 33.3 g/dL (ref 30.0–36.0)
MCV: 89.1 fL (ref 78.0–100.0)
Monocytes Absolute: 0.3 10*3/uL (ref 0.1–1.0)
Monocytes Relative: 5 % (ref 3–12)
NEUTROS ABS: 3.9 10*3/uL (ref 1.7–7.7)
Neutrophils Relative %: 57 % (ref 43–77)
Platelets: 303 10*3/uL (ref 150–400)
RBC: 4.79 MIL/uL (ref 3.87–5.11)
RDW: 13.8 % (ref 11.5–15.5)
WBC: 6.9 10*3/uL (ref 4.0–10.5)

## 2014-12-06 LAB — BRAIN NATRIURETIC PEPTIDE: B NATRIURETIC PEPTIDE 5: 30.8 pg/mL (ref 0.0–100.0)

## 2014-12-06 LAB — MAGNESIUM: MAGNESIUM: 2.2 mg/dL (ref 1.5–2.5)

## 2014-12-06 LAB — TROPONIN I: Troponin I: <0.03 ng/mL (ref <0.031)

## 2014-12-06 MED ORDER — SODIUM CHLORIDE 0.9 % IV BOLUS (SEPSIS)
250.0000 mL | Freq: Once | INTRAVENOUS | Status: AC
  Administered 2014-12-06: 250 mL via INTRAVENOUS

## 2014-12-06 MED ORDER — SODIUM CHLORIDE 0.9 % IV SOLN: Freq: Once | INTRAVENOUS | Status: DC

## 2014-12-06 NOTE — ED Notes (Signed)
Meal Tray ordered.  

## 2014-12-06 NOTE — ED Provider Notes (Signed)
CSN: [id number]     Arrival date & time 12/06/14  1722 History   First MD Initiated Contact with Patient 12/06/14 1733     Chief Complaint  Patient presents with  . Atrial Fibrillation      HPI  Vision presents for evaluation of dizziness and weakness. Recently diagnosed with a defibrillation. In review of her chart she's had history of approximately to fibrillation in the past. Falls with Dr. [name] of low bowel or cardiology. Seen at their office on Friday and placed back on Xarelto, and Cardizem which she was supposedly taking before. However she states she "doesn't like to take medications. Seen and evaluated [place] one week ago and thought to perhaps have UTI. Placed on Keflex. Her culture is since shown polymicrobial growth. She denies any dysuria or frequency. No fevers no chills. Does not have frank shortness of breath or classic PND or orthopnea. No extremity swelling. No chest pain.  Past Medical History  Diagnosis Date  . COPD (chronic obstructive pulmonary disease)   . Asthma   . ALLERGIC RHINITIS   . Hypertension   . Pleural effusion, left   . Colles' fracture of left radius   . Anxiety   . Back pain of thoracolumbar region     right  . Pruritus   . Diastolic dysfunction   . Pulmonary nodule   . IBS (irritable bowel syndrome)   . Bronchiectasis   . Insomnia   . History of pneumonia   . Diverticulosis   . Hx of colonic polyp   . Multiple rib fractures 10/2008    bilateral  . Hx of cardiovascular stress test     a. ETT-MV 1/14: Exercised 4:16, no ECG changes, poor ex tol, ant defect c/w soft tissue atten, no ischemia, EF 75%   Past Surgical History  Procedure Laterality Date  . Tonsillectomy    . Appendectomy    . Cholecystectomy    . Polypectomy    . Bladder suspension  2005    A-P  . Rotator cuff repair Right 2006  . Orif wrist fracture Left 2010      Dr. Burney Gauze.  . Total knee arthroplasty Right 08/2010  . Tonsillectomy    . Colonoscopy   04/06/2012    Procedure: COLONOSCOPY;  Surgeon: Jerene Bears, MD;  Location: [facility];  Service: Gastroenterology;  Laterality: N/A;   Family History  Problem Relation Age of Onset  . Heart disease Mother   . Colon cancer Neg Hx   . Bipolar disorder Daughter   . Rheumatic fever Mother   . Pneumonia Father    History  Substance Use Topics  . Smoking status: Former Smoker -- 0.30 packs/day for 10 years    Types: Cigarettes    Quit date: 09/19/1948  . Smokeless tobacco: Never Used  . Alcohol Use: No     Comment: rarely   OB History    No data available     Review of Systems  Constitutional: Negative for fever, chills, diaphoresis, appetite change and fatigue.  HENT: Negative for mouth sores, sore throat and trouble swallowing.   Eyes: Negative for visual disturbance.  Respiratory: Positive for shortness of breath. Negative for cough, chest tightness and wheezing.   Cardiovascular: Positive for palpitations. Negative for chest pain.  Gastrointestinal: Negative for nausea, vomiting, abdominal pain, diarrhea and abdominal distention.  Endocrine: Negative for polydipsia, polyphagia and polyuria.  Genitourinary: Negative for dysuria, frequency and hematuria.  Musculoskeletal: Negative for gait problem.  Skin: Negative for color change, pallor and rash.  Neurological: Negative for dizziness, syncope, light-headedness and headaches.  Hematological: Does not bruise/bleed easily.  Psychiatric/Behavioral: Negative for behavioral problems and confusion.      Allergies  Albuterol sulfate; Amoxicillin; Codeine; Doxycycline; Levaquin; Penicillins; Sulfa antibiotics; and Sulfonamide derivatives  Home Medications   Prior to Admission medications   Medication Sig Start Date End Date Taking? Authorizing Provider  Aclidinium Bromide (TUDORZA PRESSAIR) 400 MCG/ACT AEPB Inhale 1 puff into the lungs 2 (two) times daily. 07/03/13   Brand Males, MD  ALPRAZolam Duanne Moron) 0.25 MG tablet  Take 1 tablet (0.25 mg total) by mouth 2 (two) times daily as needed for anxiety. Dr Aundra Dubin will not be able to provide additional refills 06/23/14   Larey Dresser, MD  bimatoprost (LUMIGAN) 0.01 % SOLN Place 1 drop into both eyes daily. 08/06/14   Janith Lima, MD  budesonide-formoterol [facility]) 160-4.5 MCG/ACT inhaler Inhale 2 puffs into the lungs daily.    Historical Provider, MD  cephALEXin (KEFLEX) 500 MG capsule Take 1 capsule (500 mg total) by mouth 4 (four) times daily. 11/22/14   Daleen Bo, MD  dicyclomine (BENTYL) 10 MG capsule Take 1 capsule (10 mg total) by mouth 3 (three) times daily as needed. IBS 11/25/14   Janith Lima, MD  diltiazem (CARDIZEM CD) 240 MG 24 hr capsule Take 1 capsule (240 mg total) by mouth daily. 11/25/14   Janith Lima, MD  Fluticasone Furoate-Vilanterol (BREO ELLIPTA) 200-25 MCG/INH AEPB Inhale 1 puff into the lungs daily. 09/25/14   Brand Males, MD  levalbuterol [facility] HFA) 45 MCG/ACT inhaler Inhale 1-2 puffs into the lungs every 4 (four) hours as needed for wheezing. 04/24/13   Chesley Mires, MD  MAGNESIUM PO Take by mouth.    Historical Provider, MD  mirtazapine (REMERON) 7.5 MG tablet Take 1 tablet (7.5 mg total) by mouth at bedtime. 11/25/14   Janith Lima, MD  Multiple Vitamin (MULTIVITAMIN) tablet Take by mouth daily.      Historical Provider, MD  pantoprazole (PROTONIX) 40 MG tablet Take 40 mg by mouth daily.    Historical Provider, MD  pravastatin (PRAVACHOL) 20 MG tablet Take 1 tablet (20 mg total) by mouth every evening. 06/23/14   Larey Dresser, MD  rivaroxaban (XARELTO) 20 MG TABS tablet Take 1 tablet (20 mg total) by mouth daily with supper. 12/05/14   Burtis Junes, NP  Tiotropium Bromide Monohydrate (SPIRIVA RESPIMAT) 2.5 MCG/ACT AERS Inhale 2 puffs into the lungs daily. 09/25/14   Brand Males, MD  valsartan-hydrochlorothiazide (DIOVAN-HCT) 160-12.5 MG per tablet TAKE ONE TABLET BY MOUTH ONCE DAILY 11/25/14   Janith Lima, MD  Vitamin  D, Ergocalciferol, (DRISDOL) 50000 UNITS CAPS capsule Take 1 capsule (50,000 Units total) by mouth every 7 (seven) days. 06/06/14   Janith Lima, MD  zolpidem (AMBIEN) 5 MG tablet TAKE ONE TABLET AT BEDTIME AS NEEDED FOR SLEEP Patient taking differently: Take 1 tablet at bedtime as needed for sleep. 07/11/14   Janith Lima, MD   BP 192/100 mmHg  Pulse 45  Temp(Src) 98.3 F (36.8 C) (Oral)  Resp 31  Ht 5\' 5"  (1.651 m)  Wt 138 lb (62.596 kg)  BMI 22.96 kg/m2  SpO2 93% Physical Exam  Constitutional: She is oriented to person, place, and time. She appears well-developed and well-nourished. No distress.  HENT:  Head: Normocephalic.  Eyes: Conjunctivae are normal. Pupils are equal, round, and reactive to light. No scleral icterus.  Neck: Normal range of motion. Neck supple. No thyromegaly present.  Cardiovascular: Normal rate.  An irregularly irregular rhythm present. Exam reveals no gallop and no friction rub.   No murmur heard. Irregularly irregular. A true fibrillation on monitor. Clear lungs no JVD no gallop and no clinical signs of congestive heart failure.  Pulmonary/Chest: Effort normal and breath sounds normal. No respiratory distress. She has no wheezes. She has no rales.  Abdominal: Soft. Bowel sounds are normal. She exhibits no distension. There is no tenderness. There is no rebound.  Musculoskeletal: Normal range of motion.  Neurological: She is alert and oriented to person, place, and time.  Skin: Skin is warm and dry. No rash noted.  Psychiatric: She has a normal mood and affect. Her behavior is normal.    ED Course  Procedures (including critical care time) Labs Review Labs Reviewed  BASIC METABOLIC PANEL - Abnormal; Notable for the following:    GFR calc non Af Amer 76 (*)    GFR calc Af Amer 88 (*)    All other components within normal limits  URINE CULTURE  TROPONIN I  CBC WITH DIFFERENTIAL/PLATELET  BRAIN NATRIURETIC PEPTIDE  MAGNESIUM  URINALYSIS, ROUTINE W  REFLEX MICROSCOPIC    Imaging Review Dg Chest 2 View  12/06/2014   CLINICAL DATA:  Chest pain for 2 days.  EXAM: CHEST  2 VIEW  COMPARISON:  02/04/2014 and prior chest radiographs dating back to 11/19/2008  FINDINGS: The cardiomediastinal silhouette is unremarkable.  There is no evidence of focal airspace disease, pulmonary edema, suspicious pulmonary nodule/mass, pleural effusion, or pneumothorax. No acute bony abnormalities are identified. New she  IMPRESSION: No active cardiopulmonary disease.   Electronically Signed   By: Margarette Canada M.D.   On: 12/06/2014 18:53     EKG Interpretation None      MDM   Final diagnoses:  SOB (shortness of breath)    Reassuring studies. She is in atrial fibrillation with a controlled ventricular response. Clinically, and radiographically is not in congestive heart failure.  Blood pressure was somewhat variable here. She was uncertain if she had taken her Cardizem today. Has taken her Xarelto. Have asked her to organize her medications and try to be certain of her doses. Asked her to be compliant with those recommended by her cardiologist as well. If she has any additional low blood pressure reading is*hold her Cardizem and follow-up with her cardiologist. Her most recent wound culture was polymicrobial and likely contaminated. Her first specimen today showed squamous cells. Catheterized urine was obtained and is a cellular. I repeated culture. Instructed her to stop taking her antibiotic. We will follow up her culture results with her with any positives. Anxious appropriate for continued outpatient treatment.    Tanna Furry, MD 12/06/14 2126

## 2014-12-06 NOTE — Discharge Instructions (Signed)
Please take your medicines as prescribed.  Follow-up with your primary care physician, and your cardiologist.  You may stop taking antibiotic.  Your Urine does not appear infected.  Take your Xarelto every day as prescribed.

## 2014-12-06 NOTE — ED Notes (Signed)
Patient states she has had CP x2 days denies any pain on arivial. Patient states she was DX with a-fib 2 days ago and placed on a blood thinner. Patient states" I don't know why they brought me here".

## 2014-12-09 LAB — URINE CULTURE: Colony Count: >=100000

## 2014-12-10 ENCOUNTER — Other Ambulatory Visit (HOSPITAL_COMMUNITY): Payer: Self-pay | Admitting: Obstetrics and Gynecology

## 2014-12-10 ENCOUNTER — Telehealth: Payer: Self-pay | Admitting: *Deleted

## 2014-12-10 ENCOUNTER — Other Ambulatory Visit (HOSPITAL_COMMUNITY): Payer: Medicare Other

## 2014-12-10 DIAGNOSIS — R011 Cardiac murmur, unspecified: Secondary | ICD-10-CM

## 2014-12-10 NOTE — ED Notes (Signed)
(+)  urine culture, asymptomatic, no treatment needed, A. Kenton Kingfisher, PA-C

## 2014-12-11 ENCOUNTER — Encounter (HOSPITAL_COMMUNITY): Payer: Self-pay

## 2014-12-11 ENCOUNTER — Inpatient Hospital Stay (HOSPITAL_COMMUNITY)
Admission: EM | Admit: 2014-12-11 | Discharge: 2014-12-13 | DRG: 493 | Disposition: A | Discharge status: Skilled Nursing Facility | Payer: Medicare Other | Attending: Internal Medicine | Admitting: Internal Medicine

## 2014-12-11 ENCOUNTER — Emergency Department (HOSPITAL_COMMUNITY): Payer: Medicare Other

## 2014-12-11 ENCOUNTER — Encounter (HOSPITAL_COMMUNITY)
Admission: EM | Disposition: A | Discharge status: Skilled Nursing Facility | Payer: Self-pay | Source: Home / Self Care | Attending: Internal Medicine

## 2014-12-11 ENCOUNTER — Inpatient Hospital Stay (HOSPITAL_COMMUNITY): Payer: Medicare Other | Admitting: Certified Registered Nurse Anesthetist

## 2014-12-11 ENCOUNTER — Inpatient Hospital Stay (HOSPITAL_COMMUNITY): Payer: Medicare Other

## 2014-12-11 DIAGNOSIS — I4891 Unspecified atrial fibrillation: Secondary | ICD-10-CM | POA: Diagnosis present

## 2014-12-11 DIAGNOSIS — Z880 Allergy status to penicillin: Secondary | ICD-10-CM | POA: Diagnosis not present

## 2014-12-11 DIAGNOSIS — G4700 Insomnia, unspecified: Secondary | ICD-10-CM | POA: Diagnosis not present

## 2014-12-11 DIAGNOSIS — Y92098 Other place in other non-institutional residence as the place of occurrence of the external cause: Secondary | ICD-10-CM

## 2014-12-11 DIAGNOSIS — Z881 Allergy status to other antibiotic agents status: Secondary | ICD-10-CM

## 2014-12-11 DIAGNOSIS — I10 Essential (primary) hypertension: Secondary | ICD-10-CM | POA: Diagnosis not present

## 2014-12-11 DIAGNOSIS — J449 Chronic obstructive pulmonary disease, unspecified: Secondary | ICD-10-CM | POA: Diagnosis not present

## 2014-12-11 DIAGNOSIS — S9001XA Contusion of right ankle, initial encounter: Secondary | ICD-10-CM | POA: Diagnosis not present

## 2014-12-11 DIAGNOSIS — S82853A Displaced trimalleolar fracture of unspecified lower leg, initial encounter for closed fracture: Secondary | ICD-10-CM | POA: Diagnosis present

## 2014-12-11 DIAGNOSIS — Y92009 Unspecified place in unspecified non-institutional (private) residence as the place of occurrence of the external cause: Secondary | ICD-10-CM | POA: Diagnosis not present

## 2014-12-11 DIAGNOSIS — Z91018 Allergy to other foods: Secondary | ICD-10-CM | POA: Diagnosis not present

## 2014-12-11 DIAGNOSIS — M79642 Pain in left hand: Secondary | ICD-10-CM | POA: Diagnosis not present

## 2014-12-11 DIAGNOSIS — Z9889 Other specified postprocedural states: Secondary | ICD-10-CM

## 2014-12-11 DIAGNOSIS — Z882 Allergy status to sulfonamides status: Secondary | ICD-10-CM | POA: Diagnosis not present

## 2014-12-11 DIAGNOSIS — S82851A Displaced trimalleolar fracture of right lower leg, initial encounter for closed fracture: Principal | ICD-10-CM | POA: Diagnosis present

## 2014-12-11 DIAGNOSIS — S82851D Displaced trimalleolar fracture of right lower leg, subsequent encounter for closed fracture with routine healing: Secondary | ICD-10-CM | POA: Diagnosis not present

## 2014-12-11 DIAGNOSIS — Z7901 Long term (current) use of anticoagulants: Secondary | ICD-10-CM | POA: Diagnosis not present

## 2014-12-11 DIAGNOSIS — F419 Anxiety disorder, unspecified: Secondary | ICD-10-CM | POA: Diagnosis present

## 2014-12-11 DIAGNOSIS — M25511 Pain in right shoulder: Secondary | ICD-10-CM | POA: Diagnosis not present

## 2014-12-11 DIAGNOSIS — T1490XA Injury, unspecified, initial encounter: Secondary | ICD-10-CM

## 2014-12-11 DIAGNOSIS — S9304XA Dislocation of right ankle joint, initial encounter: Secondary | ICD-10-CM | POA: Diagnosis not present

## 2014-12-11 DIAGNOSIS — S82899A Other fracture of unspecified lower leg, initial encounter for closed fracture: Secondary | ICD-10-CM | POA: Insufficient documentation

## 2014-12-11 DIAGNOSIS — S6992XA Unspecified injury of left wrist, hand and finger(s), initial encounter: Secondary | ICD-10-CM | POA: Diagnosis not present

## 2014-12-11 DIAGNOSIS — Z96651 Presence of right artificial knee joint: Secondary | ICD-10-CM | POA: Diagnosis not present

## 2014-12-11 DIAGNOSIS — K589 Irritable bowel syndrome without diarrhea: Secondary | ICD-10-CM | POA: Diagnosis present

## 2014-12-11 DIAGNOSIS — I481 Persistent atrial fibrillation: Secondary | ICD-10-CM | POA: Diagnosis present

## 2014-12-11 DIAGNOSIS — E785 Hyperlipidemia, unspecified: Secondary | ICD-10-CM | POA: Diagnosis not present

## 2014-12-11 DIAGNOSIS — Z885 Allergy status to narcotic agent status: Secondary | ICD-10-CM | POA: Diagnosis not present

## 2014-12-11 DIAGNOSIS — M25512 Pain in left shoulder: Secondary | ICD-10-CM | POA: Diagnosis not present

## 2014-12-11 DIAGNOSIS — I480 Paroxysmal atrial fibrillation: Secondary | ICD-10-CM | POA: Diagnosis not present

## 2014-12-11 DIAGNOSIS — F418 Other specified anxiety disorders: Secondary | ICD-10-CM | POA: Diagnosis not present

## 2014-12-11 DIAGNOSIS — W19XXXA Unspecified fall, initial encounter: Secondary | ICD-10-CM

## 2014-12-11 DIAGNOSIS — Z419 Encounter for procedure for purposes other than remedying health state, unspecified: Secondary | ICD-10-CM

## 2014-12-11 DIAGNOSIS — S4991XA Unspecified injury of right shoulder and upper arm, initial encounter: Secondary | ICD-10-CM | POA: Diagnosis not present

## 2014-12-11 DIAGNOSIS — S299XXA Unspecified injury of thorax, initial encounter: Secondary | ICD-10-CM | POA: Diagnosis not present

## 2014-12-11 DIAGNOSIS — Z87891 Personal history of nicotine dependence: Secondary | ICD-10-CM | POA: Diagnosis not present

## 2014-12-11 DIAGNOSIS — Z471 Aftercare following joint replacement surgery: Secondary | ICD-10-CM | POA: Diagnosis not present

## 2014-12-11 DIAGNOSIS — J4520 Mild intermittent asthma, uncomplicated: Secondary | ICD-10-CM | POA: Diagnosis present

## 2014-12-11 DIAGNOSIS — Z79899 Other long term (current) drug therapy: Secondary | ICD-10-CM

## 2014-12-11 DIAGNOSIS — S9304XD Dislocation of right ankle joint, subsequent encounter: Secondary | ICD-10-CM | POA: Diagnosis not present

## 2014-12-11 DIAGNOSIS — S199XXA Unspecified injury of neck, initial encounter: Secondary | ICD-10-CM | POA: Diagnosis not present

## 2014-12-11 DIAGNOSIS — S82851B Displaced trimalleolar fracture of right lower leg, initial encounter for open fracture type I or II: Secondary | ICD-10-CM | POA: Diagnosis not present

## 2014-12-11 DIAGNOSIS — T07XXXA Unspecified multiple injuries, initial encounter: Secondary | ICD-10-CM

## 2014-12-11 DIAGNOSIS — S82891A Other fracture of right lower leg, initial encounter for closed fracture: Secondary | ICD-10-CM | POA: Diagnosis not present

## 2014-12-11 DIAGNOSIS — S0990XA Unspecified injury of head, initial encounter: Secondary | ICD-10-CM | POA: Diagnosis not present

## 2014-12-11 DIAGNOSIS — M25571 Pain in right ankle and joints of right foot: Secondary | ICD-10-CM | POA: Diagnosis present

## 2014-12-11 DIAGNOSIS — S8251XA Displaced fracture of medial malleolus of right tibia, initial encounter for closed fracture: Secondary | ICD-10-CM | POA: Diagnosis not present

## 2014-12-11 DIAGNOSIS — T148 Other injury of unspecified body region: Secondary | ICD-10-CM | POA: Diagnosis not present

## 2014-12-11 HISTORY — PX: EXTERNAL FIXATION LEG: SHX1549

## 2014-12-11 HISTORY — PX: ANKLE CLOSED REDUCTION: SHX880

## 2014-12-11 LAB — CBC WITH DIFFERENTIAL/PLATELET
BASOS ABS: 0 10*3/uL (ref 0.0–0.1)
Basophils Relative: 0 % (ref 0–1)
EOS PCT: 0 % (ref 0–5)
Eosinophils Absolute: 0 10*3/uL (ref 0.0–0.7)
HCT: 38.7 % (ref 36.0–46.0)
Hemoglobin: 12.9 g/dL (ref 12.0–15.0)
LYMPHS PCT: 8 % — AB (ref 12–46)
Lymphs Abs: 1.1 10*3/uL (ref 0.7–4.0)
MCH: 29.4 pg (ref 26.0–34.0)
MCHC: 33.3 g/dL (ref 30.0–36.0)
MCV: 88.2 fL (ref 78.0–100.0)
MONO ABS: 0.3 10*3/uL (ref 0.1–1.0)
Monocytes Relative: 2 % — ABNORMAL LOW (ref 3–12)
NEUTROS ABS: 12.9 10*3/uL — AB (ref 1.7–7.7)
Neutrophils Relative %: 90 % — ABNORMAL HIGH (ref 43–77)
PLATELETS: 273 10*3/uL (ref 150–400)
RBC: 4.39 MIL/uL (ref 3.87–5.11)
RDW: 13.5 % (ref 11.5–15.5)
WBC: 14.4 10*3/uL — ABNORMAL HIGH (ref 4.0–10.5)

## 2014-12-11 LAB — COMPREHENSIVE METABOLIC PANEL
ALT: 19 U/L (ref 0–35)
AST: 25 U/L (ref 0–37)
Albumin: 3.7 g/dL (ref 3.5–5.2)
Alkaline Phosphatase: 81 U/L (ref 39–117)
Anion gap: 7 (ref 5–15)
BUN: 26 mg/dL — ABNORMAL HIGH (ref 6–23)
CALCIUM: 8.6 mg/dL (ref 8.4–10.5)
CO2: 25 mmol/L (ref 19–32)
CREATININE: 0.72 mg/dL (ref 0.50–1.10)
Chloride: 107 mmol/L (ref 96–112)
GFR calc non Af Amer: 76 mL/min — ABNORMAL LOW (ref >90)
GFR, EST AFRICAN AMERICAN: 88 mL/min — AB (ref >90)
Glucose, Bld: 130 mg/dL — ABNORMAL HIGH (ref 70–99)
Potassium: 4.3 mmol/L (ref 3.5–5.1)
SODIUM: 139 mmol/L (ref 135–145)
Total Bilirubin: 0.5 mg/dL (ref 0.3–1.2)
Total Protein: 6.1 g/dL (ref 6.0–8.3)

## 2014-12-11 LAB — TYPE AND SCREEN
ABO/RH(D): O POS
Antibody Screen: NEGATIVE

## 2014-12-11 LAB — PROTIME-INR
INR: 1.82 — ABNORMAL HIGH (ref 0.00–1.49)
PROTHROMBIN TIME: 21.2 s — AB (ref 11.6–15.2)

## 2014-12-11 SURGERY — CLOSED REDUCTION, ANKLE
Anesthesia: General | Site: Ankle | Laterality: Right

## 2014-12-11 MED ORDER — DICYCLOMINE HCL 10 MG PO CAPS: 10.0000 mg | ORAL_CAPSULE | Freq: Three times a day (TID) | ORAL | Status: DC | PRN

## 2014-12-11 MED ORDER — ALBUTEROL SULFATE (2.5 MG/3ML) 0.083% IN NEBU: 2.5000 mg | INHALATION_SOLUTION | RESPIRATORY_TRACT | Status: DC | PRN

## 2014-12-11 MED ORDER — LATANOPROST 0.005 % OP SOLN
1.0000 [drp] | Freq: Every day | OPHTHALMIC | Status: DC
  Administered 2014-12-11 – 2014-12-12 (×2): 1 [drp] via OPHTHALMIC
  Filled 2014-12-11: qty 2.5

## 2014-12-11 MED ORDER — ENOXAPARIN SODIUM 40 MG/0.4ML ~~LOC~~ SOLN: 40.0000 mg | SUBCUTANEOUS | Status: DC

## 2014-12-11 MED ORDER — FLUTICASONE FUROATE-VILANTEROL 200-25 MCG/INH IN AEPB: 1.0000 | INHALATION_SPRAY | Freq: Every day | RESPIRATORY_TRACT | Status: DC

## 2014-12-11 MED ORDER — VALSARTAN-HYDROCHLOROTHIAZIDE 160-12.5 MG PO TABS: 1.0000 | ORAL_TABLET | Freq: Every day | ORAL | Status: DC

## 2014-12-11 MED ORDER — ACETAMINOPHEN 650 MG RE SUPP: 650.0000 mg | Freq: Three times a day (TID) | RECTAL | Status: DC

## 2014-12-11 MED ORDER — FENTANYL CITRATE 0.05 MG/ML IJ SOLN
50.0000 ug | Freq: Once | INTRAMUSCULAR | Status: AC
  Administered 2014-12-11: 50 ug via INTRAVENOUS
  Filled 2014-12-11: qty 2

## 2014-12-11 MED ORDER — ACETAMINOPHEN 500 MG PO TABS
1000.0000 mg | ORAL_TABLET | Freq: Three times a day (TID) | ORAL | Status: DC
  Administered 2014-12-11 – 2014-12-12 (×2): 1000 mg via ORAL
  Filled 2014-12-11 (×2): qty 2

## 2014-12-11 MED ORDER — CEFAZOLIN SODIUM-DEXTROSE 2-3 GM-% IV SOLR
INTRAVENOUS | Status: DC | PRN
  Administered 2014-12-11: 2 g via INTRAVENOUS

## 2014-12-11 MED ORDER — FENTANYL CITRATE 0.05 MG/ML IJ SOLN
INTRAMUSCULAR | Status: AC
  Filled 2014-12-11: qty 2

## 2014-12-11 MED ORDER — TIOTROPIUM BROMIDE MONOHYDRATE 18 MCG IN CAPS
18.0000 ug | ORAL_CAPSULE | Freq: Every day | RESPIRATORY_TRACT | Status: DC
  Administered 2014-12-13: 18 ug via RESPIRATORY_TRACT
  Filled 2014-12-11: qty 5

## 2014-12-11 MED ORDER — PHENYLEPHRINE 40 MCG/ML (10ML) SYRINGE FOR IV PUSH (FOR BLOOD PRESSURE SUPPORT)
PREFILLED_SYRINGE | INTRAVENOUS | Status: AC
  Filled 2014-12-11: qty 10

## 2014-12-11 MED ORDER — FENTANYL CITRATE 0.05 MG/ML IJ SOLN
INTRAMUSCULAR | Status: AC
  Filled 2014-12-11: qty 5

## 2014-12-11 MED ORDER — LACTATED RINGERS IV SOLN
INTRAVENOUS | Status: DC | PRN
  Administered 2014-12-11 (×2): via INTRAVENOUS

## 2014-12-11 MED ORDER — PRAVASTATIN SODIUM 20 MG PO TABS
20.0000 mg | ORAL_TABLET | Freq: Every evening | ORAL | Status: DC
  Administered 2014-12-12: 20 mg via ORAL
  Filled 2014-12-11: qty 1

## 2014-12-11 MED ORDER — IRBESARTAN 150 MG PO TABS
150.0000 mg | ORAL_TABLET | Freq: Every day | ORAL | Status: DC
  Administered 2014-12-12 – 2014-12-13 (×2): 150 mg via ORAL
  Filled 2014-12-11 (×2): qty 1

## 2014-12-11 MED ORDER — FENTANYL CITRATE 0.05 MG/ML IJ SOLN
INTRAMUSCULAR | Status: DC | PRN
  Administered 2014-12-11 (×2): 50 ug via INTRAVENOUS

## 2014-12-11 MED ORDER — METOCLOPRAMIDE HCL 5 MG/ML IJ SOLN: 5.0000 mg | Freq: Three times a day (TID) | INTRAMUSCULAR | Status: DC | PRN

## 2014-12-11 MED ORDER — CEFAZOLIN SODIUM-DEXTROSE 2-3 GM-% IV SOLR
INTRAVENOUS | Status: AC
  Filled 2014-12-11: qty 50

## 2014-12-11 MED ORDER — LIDOCAINE HCL (PF) 1 % IJ SOLN
10.0000 mL | Freq: Once | INTRAMUSCULAR | Status: DC
  Filled 2014-12-11: qty 10

## 2014-12-11 MED ORDER — LIDOCAINE HCL (CARDIAC) 20 MG/ML IV SOLN
INTRAVENOUS | Status: DC | PRN
  Administered 2014-12-11: 100 mg via INTRAVENOUS

## 2014-12-11 MED ORDER — PROPOFOL 10 MG/ML IV BOLUS
INTRAVENOUS | Status: DC | PRN
  Administered 2014-12-11 (×2): 50 mg via INTRAVENOUS

## 2014-12-11 MED ORDER — ONDANSETRON HCL 4 MG/2ML IJ SOLN
INTRAMUSCULAR | Status: DC | PRN
  Administered 2014-12-11: 4 mg via INTRAVENOUS

## 2014-12-11 MED ORDER — DEXAMETHASONE SODIUM PHOSPHATE 4 MG/ML IJ SOLN
INTRAMUSCULAR | Status: AC
  Filled 2014-12-11: qty 1

## 2014-12-11 MED ORDER — SUCCINYLCHOLINE CHLORIDE 20 MG/ML IJ SOLN
INTRAMUSCULAR | Status: DC | PRN
  Administered 2014-12-11: 100 mg via INTRAVENOUS

## 2014-12-11 MED ORDER — DEXAMETHASONE SODIUM PHOSPHATE 4 MG/ML IJ SOLN
INTRAMUSCULAR | Status: DC | PRN
  Administered 2014-12-11: 4 mg via INTRAVENOUS

## 2014-12-11 MED ORDER — FENTANYL CITRATE 0.05 MG/ML IJ SOLN
25.0000 ug | INTRAMUSCULAR | Status: DC | PRN
  Administered 2014-12-11 (×2): 25 ug via INTRAVENOUS

## 2014-12-11 MED ORDER — ONDANSETRON HCL 4 MG PO TABS: 4.0000 mg | ORAL_TABLET | Freq: Four times a day (QID) | ORAL | Status: DC | PRN

## 2014-12-11 MED ORDER — METOCLOPRAMIDE HCL 5 MG PO TABS: 5.0000 mg | ORAL_TABLET | Freq: Three times a day (TID) | ORAL | Status: DC | PRN

## 2014-12-11 MED ORDER — MIDAZOLAM HCL 2 MG/2ML IJ SOLN
INTRAMUSCULAR | Status: AC
  Filled 2014-12-11: qty 2

## 2014-12-11 MED ORDER — SODIUM CHLORIDE 0.9 % IV SOLN
INTRAVENOUS | Status: DC
  Administered 2014-12-11 – 2014-12-12 (×2): via INTRAVENOUS

## 2014-12-11 MED ORDER — LIDOCAINE HCL (CARDIAC) 20 MG/ML IV SOLN
INTRAVENOUS | Status: AC
  Filled 2014-12-11: qty 5

## 2014-12-11 MED ORDER — CEFAZOLIN SODIUM 1-5 GM-% IV SOLN
1.0000 g | Freq: Four times a day (QID) | INTRAVENOUS | Status: DC
  Administered 2014-12-12 (×2): 1 g via INTRAVENOUS
  Filled 2014-12-11 (×3): qty 50

## 2014-12-11 MED ORDER — VITAMIN D (ERGOCALCIFEROL) 1.25 MG (50000 UNIT) PO CAPS: 50000.0000 [IU] | ORAL_CAPSULE | ORAL | Status: DC

## 2014-12-11 MED ORDER — TRAMADOL HCL 50 MG PO TABS
50.0000 mg | ORAL_TABLET | Freq: Four times a day (QID) | ORAL | Status: DC | PRN
  Administered 2014-12-12: 50 mg via ORAL
  Filled 2014-12-11: qty 1

## 2014-12-11 MED ORDER — PROPOFOL 10 MG/ML IV BOLUS
INTRAVENOUS | Status: AC
  Filled 2014-12-11: qty 20

## 2014-12-11 MED ORDER — SODIUM CHLORIDE 0.9 % IV BOLUS (SEPSIS)
500.0000 mL | Freq: Once | INTRAVENOUS | Status: AC
Start: 2014-12-11 — End: 2014-12-11
  Administered 2014-12-11: 500 mL via INTRAVENOUS

## 2014-12-11 MED ORDER — MORPHINE SULFATE 2 MG/ML IJ SOLN
2.0000 mg | Freq: Once | INTRAMUSCULAR | Status: AC
  Administered 2014-12-11: 2 mg via INTRAVENOUS
  Filled 2014-12-11: qty 1

## 2014-12-11 MED ORDER — ZOLPIDEM TARTRATE 5 MG PO TABS
5.0000 mg | ORAL_TABLET | Freq: Every evening | ORAL | Status: DC | PRN
  Administered 2014-12-11 – 2014-12-12 (×2): 5 mg via ORAL
  Filled 2014-12-11 (×2): qty 1

## 2014-12-11 MED ORDER — ONDANSETRON HCL 4 MG/2ML IJ SOLN: 4.0000 mg | Freq: Four times a day (QID) | INTRAMUSCULAR | Status: DC | PRN

## 2014-12-11 MED ORDER — DOCUSATE SODIUM 100 MG PO CAPS
100.0000 mg | ORAL_CAPSULE | Freq: Two times a day (BID) | ORAL | Status: DC
Start: 2014-12-12 — End: 2014-12-13
  Administered 2014-12-11 – 2014-12-13 (×4): 100 mg via ORAL
  Filled 2014-12-11 (×4): qty 1

## 2014-12-11 MED ORDER — SUCCINYLCHOLINE CHLORIDE 20 MG/ML IJ SOLN
INTRAMUSCULAR | Status: AC
  Filled 2014-12-11: qty 1

## 2014-12-11 MED ORDER — HYDROCHLOROTHIAZIDE 12.5 MG PO CAPS
12.5000 mg | ORAL_CAPSULE | Freq: Every day | ORAL | Status: DC
  Administered 2014-12-12 – 2014-12-13 (×2): 12.5 mg via ORAL
  Filled 2014-12-11 (×2): qty 1

## 2014-12-11 MED ORDER — DILTIAZEM HCL ER COATED BEADS 240 MG PO CP24
240.0000 mg | ORAL_CAPSULE | Freq: Every day | ORAL | Status: DC
  Administered 2014-12-12 – 2014-12-13 (×2): 240 mg via ORAL
  Filled 2014-12-11 (×2): qty 1

## 2014-12-11 MED ORDER — PHENYLEPHRINE HCL 10 MG/ML IJ SOLN
INTRAMUSCULAR | Status: DC | PRN
  Administered 2014-12-11 (×3): 80 ug via INTRAVENOUS

## 2014-12-11 MED ORDER — 0.9 % SODIUM CHLORIDE (POUR BTL) OPTIME
TOPICAL | Status: DC | PRN
  Administered 2014-12-11: 1000 mL

## 2014-12-11 MED ORDER — ONDANSETRON HCL 4 MG/2ML IJ SOLN
INTRAMUSCULAR | Status: AC
  Filled 2014-12-11: qty 2

## 2014-12-11 MED ORDER — ENSURE ENLIVE PO LIQD
237.0000 mL | Freq: Two times a day (BID) | ORAL | Status: DC
  Filled 2014-12-11: qty 237

## 2014-12-11 SURGICAL SUPPLY — 52 items
11MM BENT BAR 175MM ×1 IMPLANT
350MM BAR X 11MM ×2 IMPLANT
BAG DECANTER FOR FLEXI CONT (MISCELLANEOUS) IMPLANT
BANDAGE ELASTIC 4 VELCRO ST LF (GAUZE/BANDAGES/DRESSINGS) ×3 IMPLANT
BANDAGE ELASTIC 6 VELCRO ST LF (GAUZE/BANDAGES/DRESSINGS) ×3 IMPLANT
BAR EXFX 350X11 NS LF (EXFIX) ×1
BAR EXFX 400X11 NS LF (EXFIX) ×1
BAR GLASS FIBER EXFX 11X175 (EXFIX) ×2 IMPLANT
BAR GLASS FIBER EXFX 11X350 (EXFIX) ×1 IMPLANT
BAR GLASS FIBER EXFX 11X400 (EXFIX) ×2 IMPLANT
BNDG GAUZE ELAST 4 BULKY (GAUZE/BANDAGES/DRESSINGS) ×2 IMPLANT
CLAMP BLUE BAR TO BAR (MISCELLANEOUS) ×4 IMPLANT
CLAMP BLUE BAR TO PIN (MISCELLANEOUS) ×4 IMPLANT
DRAPE EXTREMITY T 121X128X90 (DRAPE) ×4 IMPLANT
DRAPE INCISE IOBAN 66X45 STRL (DRAPES) IMPLANT
DRAPE OEC MINIVIEW 54X84 (DRAPES) IMPLANT
DRAPE U-SHAPE 47X51 STRL (DRAPES) ×3 IMPLANT
DRSG ADAPTIC 3X8 NADH LF (GAUZE/BANDAGES/DRESSINGS) ×1 IMPLANT
DRSG PAD ABDOMINAL 8X10 ST (GAUZE/BANDAGES/DRESSINGS) ×3 IMPLANT
DURAPREP 26ML APPLICATOR (WOUND CARE) ×1 IMPLANT
ELECT REM PT RETURN 9FT ADLT (ELECTROSURGICAL) ×3
ELECTRODE REM PT RTRN 9FT ADLT (ELECTROSURGICAL) ×1 IMPLANT
FACESHIELD STD STERILE (MASK) ×4 IMPLANT
GAUZE XEROFORM 5X9 LF (GAUZE/BANDAGES/DRESSINGS) ×2 IMPLANT
GLOVE BIOGEL PI IND STRL 8.5 (GLOVE) IMPLANT
GLOVE BIOGEL PI INDICATOR 8.5 (GLOVE) ×4
GLOVE SURG ORTHO 9.0 STRL STRW (GLOVE) ×1 IMPLANT
GOWN STRL REUS W/ TWL XL LVL3 (GOWN DISPOSABLE) ×3 IMPLANT
GOWN STRL REUS W/TWL XL LVL3 (GOWN DISPOSABLE) ×9
KIT BASIN OR (CUSTOM PROCEDURE TRAY) ×3 IMPLANT
KIT ROOM TURNOVER OR (KITS) ×3 IMPLANT
MANIFOLD NEPTUNE II (INSTRUMENTS) ×1 IMPLANT
NS IRRIG 1000ML POUR BTL (IV SOLUTION) ×3 IMPLANT
PACK ORTHO EXTREMITY (CUSTOM PROCEDURE TRAY) ×3 IMPLANT
PAD ARMBOARD 7.5X6 YLW CONV (MISCELLANEOUS) ×6 IMPLANT
PAD CAST 4YDX4 CTTN HI CHSV (CAST SUPPLIES) ×1 IMPLANT
PADDING CAST COTTON 4X4 STRL (CAST SUPPLIES) ×3
PENCIL BUTTON HOLSTER BLD 10FT (ELECTRODE) ×3 IMPLANT
PIN CLAMP 2BAR 75MM BLUE (PIN) ×2 IMPLANT
PIN HALF YELLOW 5X160X35 (PIN) ×4 IMPLANT
PIN TRANSFIXING 5.0 (PIN) ×2 IMPLANT
STOCKINETTE 6  STRL (DRAPES) ×2
STOCKINETTE 6 STRL (DRAPES) ×1 IMPLANT
SUCTION FRAZIER TIP 10 FR DISP (SUCTIONS) ×1 IMPLANT
SUT ETHILON 3 0 FSLX (SUTURE) ×1 IMPLANT
SUT VIC AB 2-0 CTB1 (SUTURE) ×1 IMPLANT
TOWEL OR 17X24 6PK STRL BLUE (TOWEL DISPOSABLE) ×3 IMPLANT
TOWEL OR 17X26 10 PK STRL BLUE (TOWEL DISPOSABLE) ×3 IMPLANT
TUBE CONNECTING 12'X1/4 (SUCTIONS) ×1
TUBE CONNECTING 12X1/4 (SUCTIONS) ×2 IMPLANT
WATER STERILE IRR 1000ML POUR (IV SOLUTION) ×3 IMPLANT
YANKAUER SUCT BULB TIP NO VENT (SUCTIONS) IMPLANT

## 2014-12-11 NOTE — Anesthesia Postprocedure Evaluation (Signed)
  Anesthesia Post-op Note  Patient: Lori [name]  Procedure(s) Performed: Procedure(s): CLOSED REDUCTION ANKLE (Right) POSSIBLE EXTERNAL FIXATION RIGHT ANKLE (Right)  Patient Location: PACU  Anesthesia Type:General  Level of Consciousness: awake, alert , oriented and patient cooperative  Airway and Oxygen Therapy: Patient Spontanous Breathing  Post-op Pain: none  Post-op Assessment: Post-op Vital signs reviewed, Patient's Cardiovascular Status Stable, Respiratory Function Stable, Patent Airway, No signs of Nausea or vomiting and Pain level controlled  Post-op Vital Signs: Reviewed and stable  Last Vitals:  Filed Vitals:   12/11/14 2305  BP: 159/62  Pulse: 67  Temp: 36.7 C  Resp: 18    Complications: No apparent anesthesia complications
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
## 2014-12-11 NOTE — Anesthesia Preprocedure Evaluation (Addendum)
Anesthesia Evaluation  Patient identified by MRN, date of birth, ID band Patient awake    Reviewed: Allergy & Precautions, NPO status , Patient's Chart, lab work & pertinent test results  History of Anesthesia Complications Negative for: history of anesthetic complications  Airway Mallampati: I  TM Distance: >3 FB Neck ROM: Full    Dental  (+) Teeth Intact, Dental Advisory Given   Pulmonary asthma , COPD COPD inhaler, former smoker,  breath sounds clear to auscultation        Cardiovascular hypertension, Pt. on medications - angina+ dysrhythmias Atrial Fibrillation Rhythm:Irregular Rate:Normal  '14 ETT: no ischemia, EF 75% Echo 5/15:  EF 60-65%, grade II diastolic dysfunction and normal RV.    Neuro/Psych negative neurological ROS     GI/Hepatic negative GI ROS, Neg liver ROS,   Endo/Other  negative endocrine ROS  Renal/GU negative Renal ROS     Musculoskeletal   Abdominal   Peds  Hematology  (+) Blood dyscrasia (xarelto, INR 1.82), ,   Anesthesia Other Findings   Reproductive/Obstetrics                         Anesthesia Physical Anesthesia Plan  ASA: III and emergent  Anesthesia Plan: General   Post-op Pain Management:    Induction: Intravenous, Rapid sequence and Cricoid pressure planned  Airway Management Planned: Oral ETT  Additional Equipment:   Intra-op Plan:   Post-operative Plan: Extubation in OR  Informed Consent: I have reviewed the patients History and Physical, chart, labs and discussed the procedure including the risks, benefits and alternatives for the proposed anesthesia with the patient or authorized representative who has indicated his/her understanding and acceptance.   Dental advisory given  Plan Discussed with: Surgeon, Anesthesiologist and CRNA  Anesthesia Plan Comments: (Plan routine monitors, GETA)       Anesthesia Quick Evaluation

## 2014-12-11 NOTE — Brief Op Note (Signed)
12/11/2014  9:46 PM  PATIENT:  Lori [name]  81 y.o. female  PRE-OPERATIVE DIAGNOSIS:  RIGHT ANKLE FRACTURE WITH DISLOCATION  POST-OPERATIVE DIAGNOSIS:  RIGHT ANKLE FRACTURE WITH DISLOCATION  PROCEDURE:  Procedure(s): CLOSED REDUCTION ANKLE (Right) POSSIBLE EXTERNAL FIXATION RIGHT ANKLE (Right)  SURGEON:  Surgeon(s) and Role:    * Rod Can, MD - Primary  PHYSICIAN ASSISTANT: Merla Riches, PA-C  ASSISTANTS: none   ANESTHESIA:   general  EBL:  Total I/O In: 1000 [I.V.:1000] Out: -   BLOOD ADMINISTERED:none  DRAINS: none   LOCAL MEDICATIONS USED:  NONE  SPECIMEN:  No Specimen  DISPOSITION OF SPECIMEN:  N/A  COUNTS:  YES  TOURNIQUET:  * No tourniquets in log *  DICTATION: .Other Dictation: Dictation Number [id number]  PLAN OF CARE: Admit to inpatient   PATIENT DISPOSITION:  PACU - hemodynamically stable.   Delay start of Pharmacological VTE agent (>24hrs) due to surgical blood loss or risk of bleeding: not applicable

## 2014-12-11 NOTE — Anesthesia Procedure Notes (Signed)
Procedure Name: Intubation Date/Time: 12/11/2014 8:28 PM Performed by: Claris Che Pre-anesthesia Checklist: Patient identified, Emergency Drugs available, Suction available, Patient being monitored and Timeout performed Patient Re-evaluated:Patient Re-evaluated prior to inductionOxygen Delivery Method: Circle system utilized Preoxygenation: Pre-oxygenation with 100% oxygen Intubation Type: IV induction, Rapid sequence and Cricoid Pressure applied Ventilation: Mask ventilation without difficulty Laryngoscope Size: Mac and 3 Grade View: Grade III Tube type: Oral Tube size: 8.0 mm Number of attempts: 1 Airway Equipment and Method: Stylet Placement Confirmation: ETT inserted through vocal cords under direct vision,  positive ETCO2 and breath sounds checked- equal and bilateral Secured at: 22 cm Tube secured with: Tape Dental Injury: Teeth and Oropharynx as per pre-operative assessment

## 2014-12-11 NOTE — ED Notes (Signed)
Ortho MD to see pt. And splint leg before transporting to unit.

## 2014-12-11 NOTE — ED Notes (Signed)
Patient transported to X-ray 
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
## 2014-12-11 NOTE — Transfer of Care (Signed)
Immediate Anesthesia Transfer of Care Note  Patient: Lori [name]  Procedure(s) Performed: Procedure(s): CLOSED REDUCTION ANKLE (Right) POSSIBLE EXTERNAL FIXATION RIGHT ANKLE (Right)  Patient Location: PACU  Anesthesia Type:General  Level of Consciousness: awake, alert , oriented and patient cooperative  Airway & Oxygen Therapy: Patient Spontanous Breathing and Patient connected to nasal cannula oxygen  Post-op Assessment: Report given to RN, Post -op Vital signs reviewed and stable and Patient moving all extremities X 4  Post vital signs: Reviewed and stable  Last Vitals:  Filed Vitals:   12/11/14 1933  BP: 119/56  Pulse: 66  Temp:   Resp: 20    Complications: No apparent anesthesia complications

## 2014-12-11 NOTE — Progress Notes (Signed)
Orthopedic Tech Progress Note Patient Details:  Yukie Bergeron Linhardt 12/30/1925 [id number]  Ortho Devices Type of Ortho Device: Ace wrap, Stirrup splint Ortho Device/Splint Location: rle Ortho Device/Splint Interventions: Application Stirrup splint applied to rle with foot exposed as ordered by Dr. Silvestre Mesi, Windell Musson 12/11/2014, 5:01 PM
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
## 2014-12-12 ENCOUNTER — Inpatient Hospital Stay (HOSPITAL_COMMUNITY): Payer: Medicare Other

## 2014-12-12 ENCOUNTER — Inpatient Hospital Stay (HOSPITAL_COMMUNITY): Payer: Medicare Other | Admitting: Certified Registered Nurse Anesthetist

## 2014-12-12 ENCOUNTER — Encounter (HOSPITAL_COMMUNITY): Payer: Self-pay

## 2014-12-12 ENCOUNTER — Encounter (HOSPITAL_COMMUNITY)
Admission: EM | Disposition: A | Discharge status: Skilled Nursing Facility | Payer: Self-pay | Source: Home / Self Care | Attending: Internal Medicine

## 2014-12-12 DIAGNOSIS — I10 Essential (primary) hypertension: Secondary | ICD-10-CM

## 2014-12-12 DIAGNOSIS — I480 Paroxysmal atrial fibrillation: Secondary | ICD-10-CM

## 2014-12-12 DIAGNOSIS — S82851A Displaced trimalleolar fracture of right lower leg, initial encounter for closed fracture: Principal | ICD-10-CM

## 2014-12-12 DIAGNOSIS — S82899A Other fracture of unspecified lower leg, initial encounter for closed fracture: Secondary | ICD-10-CM | POA: Diagnosis present

## 2014-12-12 HISTORY — PX: EXTERNAL FIXATION LEG: SHX1549

## 2014-12-12 LAB — BASIC METABOLIC PANEL
Anion gap: 3 — ABNORMAL LOW (ref 5–15)
BUN: 20 mg/dL (ref 6–23)
CALCIUM: 8.3 mg/dL — AB (ref 8.4–10.5)
CO2: 26 mmol/L (ref 19–32)
Chloride: 106 mmol/L (ref 96–112)
Creatinine, Ser: 0.69 mg/dL (ref 0.50–1.10)
GFR calc Af Amer: 89 mL/min — ABNORMAL LOW (ref >90)
GFR calc non Af Amer: 77 mL/min — ABNORMAL LOW (ref >90)
GLUCOSE: 167 mg/dL — AB (ref 70–99)
Potassium: 4.2 mmol/L (ref 3.5–5.1)
Sodium: 135 mmol/L (ref 135–145)

## 2014-12-12 LAB — ABO/RH: ABO/RH(D): O POS

## 2014-12-12 LAB — MAGNESIUM: Magnesium: 1.9 mg/dL (ref 1.5–2.5)

## 2014-12-12 LAB — SURGICAL PCR SCREEN
MRSA, PCR: NEGATIVE
Staphylococcus aureus: NEGATIVE

## 2014-12-12 SURGERY — EXTERNAL FIXATION, LOWER EXTREMITY
Anesthesia: General | Site: Ankle | Laterality: Right

## 2014-12-12 MED ORDER — LACTATED RINGERS IV SOLN
INTRAVENOUS | Status: DC | PRN
  Administered 2014-12-12: 10:00:00 via INTRAVENOUS

## 2014-12-12 MED ORDER — OXYCODONE HCL 5 MG/5ML PO SOLN: 5.0000 mg | Freq: Once | ORAL | Status: DC | PRN

## 2014-12-12 MED ORDER — ACETAMINOPHEN 325 MG PO TABS
650.0000 mg | ORAL_TABLET | Freq: Four times a day (QID) | ORAL | Status: DC | PRN
  Administered 2014-12-13: 650 mg via ORAL
  Filled 2014-12-12: qty 2

## 2014-12-12 MED ORDER — LACTATED RINGERS IV SOLN
INTRAVENOUS | Status: DC
  Administered 2014-12-12: 09:00:00 via INTRAVENOUS

## 2014-12-12 MED ORDER — ONDANSETRON HCL 4 MG/2ML IJ SOLN: 4.0000 mg | Freq: Once | INTRAMUSCULAR | Status: DC | PRN

## 2014-12-12 MED ORDER — FENTANYL CITRATE 0.05 MG/ML IJ SOLN
INTRAMUSCULAR | Status: DC | PRN
  Administered 2014-12-12: 50 ug via INTRAVENOUS
  Administered 2014-12-12 (×2): 25 ug via INTRAVENOUS

## 2014-12-12 MED ORDER — PROPOFOL 10 MG/ML IV BOLUS
INTRAVENOUS | Status: DC | PRN
  Administered 2014-12-12: 90 mg via INTRAVENOUS

## 2014-12-12 MED ORDER — HYDROMORPHONE HCL 1 MG/ML IJ SOLN: 0.2500 mg | INTRAMUSCULAR | Status: DC | PRN

## 2014-12-12 MED ORDER — PROPOFOL 10 MG/ML IV BOLUS
INTRAVENOUS | Status: AC
  Filled 2014-12-12: qty 20

## 2014-12-12 MED ORDER — TRAMADOL HCL 50 MG PO TABS
50.0000 mg | ORAL_TABLET | Freq: Four times a day (QID) | ORAL | Status: DC | PRN
  Administered 2014-12-12 – 2014-12-13 (×3): 50 mg via ORAL
  Filled 2014-12-12 (×3): qty 1

## 2014-12-12 MED ORDER — SUCCINYLCHOLINE CHLORIDE 20 MG/ML IJ SOLN
INTRAMUSCULAR | Status: AC
  Filled 2014-12-12: qty 1

## 2014-12-12 MED ORDER — ONDANSETRON HCL 4 MG/2ML IJ SOLN
INTRAMUSCULAR | Status: AC
  Filled 2014-12-12: qty 2

## 2014-12-12 MED ORDER — STERILE WATER FOR INJECTION IJ SOLN
INTRAMUSCULAR | Status: AC
  Filled 2014-12-12: qty 20

## 2014-12-12 MED ORDER — ARTIFICIAL TEARS OP OINT
TOPICAL_OINTMENT | OPHTHALMIC | Status: AC
  Filled 2014-12-12: qty 3.5

## 2014-12-12 MED ORDER — EPHEDRINE SULFATE 50 MG/ML IJ SOLN
INTRAMUSCULAR | Status: AC
  Filled 2014-12-12: qty 2

## 2014-12-12 MED ORDER — LIDOCAINE HCL (CARDIAC) 20 MG/ML IV SOLN
INTRAVENOUS | Status: DC | PRN
  Administered 2014-12-12: 80 mg via INTRAVENOUS

## 2014-12-12 MED ORDER — CEFAZOLIN SODIUM-DEXTROSE 2-3 GM-% IV SOLR
INTRAVENOUS | Status: AC
  Filled 2014-12-12: qty 50

## 2014-12-12 MED ORDER — ACETAMINOPHEN 650 MG RE SUPP: 650.0000 mg | Freq: Four times a day (QID) | RECTAL | Status: DC | PRN

## 2014-12-12 MED ORDER — OXYCODONE HCL 5 MG PO TABS: 5.0000 mg | ORAL_TABLET | Freq: Once | ORAL | Status: DC | PRN

## 2014-12-12 MED ORDER — EPHEDRINE SULFATE 50 MG/ML IJ SOLN
INTRAMUSCULAR | Status: DC | PRN
  Administered 2014-12-12 (×3): 5 mg via INTRAVENOUS

## 2014-12-12 MED ORDER — SODIUM CHLORIDE 0.9 % IR SOLN
Status: DC | PRN
  Administered 2014-12-12: 1000 mL

## 2014-12-12 MED ORDER — FENTANYL CITRATE 0.05 MG/ML IJ SOLN
INTRAMUSCULAR | Status: AC
  Filled 2014-12-12: qty 5

## 2014-12-12 MED ORDER — ONDANSETRON HCL 4 MG/2ML IJ SOLN
INTRAMUSCULAR | Status: DC | PRN
  Administered 2014-12-12: 4 mg via INTRAVENOUS

## 2014-12-12 SURGICAL SUPPLY — 39 items
BANDAGE ELASTIC 4 VELCRO ST LF (GAUZE/BANDAGES/DRESSINGS) ×1 IMPLANT
BANDAGE ELASTIC 6 VELCRO ST LF (GAUZE/BANDAGES/DRESSINGS) ×3 IMPLANT
BNDG COHESIVE 4X5 TAN STRL (GAUZE/BANDAGES/DRESSINGS) ×2 IMPLANT
BNDG COHESIVE 6X5 TAN STRL LF (GAUZE/BANDAGES/DRESSINGS) ×1 IMPLANT
BNDG GAUZE ELAST 4 BULKY (GAUZE/BANDAGES/DRESSINGS) ×4 IMPLANT
COVER MAYO STAND STRL (DRAPES) ×2 IMPLANT
COVER SURGICAL LIGHT HANDLE (MISCELLANEOUS) ×3 IMPLANT
DRAPE C-ARM 42X72 X-RAY (DRAPES) ×2 IMPLANT
DRAPE C-ARMOR (DRAPES) ×3 IMPLANT
DRAPE U-SHAPE 47X51 STRL (DRAPES) ×3 IMPLANT
ELECT REM PT RETURN 9FT ADLT (ELECTROSURGICAL) ×3
ELECTRODE REM PT RTRN 9FT ADLT (ELECTROSURGICAL) ×1 IMPLANT
GAUZE SPONGE 4X4 12PLY STRL (GAUZE/BANDAGES/DRESSINGS) ×5 IMPLANT
GAUZE XEROFORM 5X9 LF (GAUZE/BANDAGES/DRESSINGS) ×3 IMPLANT
GOWN STRL REUS W/ TWL XL LVL3 (GOWN DISPOSABLE) IMPLANT
GOWN STRL REUS W/TWL XL LVL3 (GOWN DISPOSABLE) ×3
HANDPIECE INTERPULSE COAX TIP (DISPOSABLE)
KIT BASIN OR (CUSTOM PROCEDURE TRAY) ×3 IMPLANT
KIT ROOM TURNOVER OR (KITS) ×3 IMPLANT
NEEDLE 22X1 1/2 (OR ONLY) (NEEDLE) IMPLANT
NS IRRIG 1000ML POUR BTL (IV SOLUTION) ×3 IMPLANT
PACK ORTHO EXTREMITY (CUSTOM PROCEDURE TRAY) ×3 IMPLANT
PAD ABD 8X10 STRL (GAUZE/BANDAGES/DRESSINGS) ×2 IMPLANT
PAD ARMBOARD 7.5X6 YLW CONV (MISCELLANEOUS) ×6 IMPLANT
PADDING CAST COTTON 6X4 STRL (CAST SUPPLIES) ×5 IMPLANT
SET HNDPC FAN SPRY TIP SCT (DISPOSABLE) IMPLANT
SPONGE LAP 18X18 X RAY DECT (DISPOSABLE) ×3 IMPLANT
SPONGE SCRUB IODOPHOR (GAUZE/BANDAGES/DRESSINGS) ×1 IMPLANT
STAPLER VISISTAT 35W (STAPLE) IMPLANT
STOCKINETTE IMPERVIOUS LG (DRAPES) ×3 IMPLANT
SUT ETHILON 3 0 PS 1 (SUTURE) ×2 IMPLANT
SYR CONTROL 10ML LL (SYRINGE) IMPLANT
TOWEL OR 17X24 6PK STRL BLUE (TOWEL DISPOSABLE) ×4 IMPLANT
TOWEL OR 17X26 10 PK STRL BLUE (TOWEL DISPOSABLE) ×4 IMPLANT
TUBE CONNECTING 12'X1/4 (SUCTIONS) ×1
TUBE CONNECTING 12X1/4 (SUCTIONS) ×2 IMPLANT
UNDERPAD 30X30 INCONTINENT (UNDERPADS AND DIAPERS) ×3 IMPLANT
WATER STERILE IRR 1000ML POUR (IV SOLUTION) ×2 IMPLANT
YANKAUER SUCT BULB TIP NO VENT (SUCTIONS) ×3 IMPLANT
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
## 2014-12-12 NOTE — Anesthesia Preprocedure Evaluation (Signed)
Anesthesia Evaluation  Patient identified by MRN, date of birth, ID band Patient awake    Reviewed: Allergy & Precautions, NPO status , Patient's Chart, lab work & pertinent test results  Airway Mallampati: I  TM Distance: >3 FB Neck ROM: Full    Dental  (+) Teeth Intact, Dental Advisory Given   Pulmonary asthma , former smoker,  breath sounds clear to auscultation        Cardiovascular hypertension, Pt. on medications Rhythm:Regular Rate:Normal     Neuro/Psych    GI/Hepatic   Endo/Other    Renal/GU      Musculoskeletal   Abdominal   Peds  Hematology   Anesthesia Other Findings Pt relates recent hx of A Fib    Reproductive/Obstetrics                             Anesthesia Physical Anesthesia Plan  ASA: III  Anesthesia Plan: General   Post-op Pain Management:    Induction: Intravenous  Airway Management Planned: LMA  Additional Equipment:   Intra-op Plan:   Post-operative Plan: Extubation in OR  Informed Consent: I have reviewed the patients History and Physical, chart, labs and discussed the procedure including the risks, benefits and alternatives for the proposed anesthesia with the patient or authorized representative who has indicated his/her understanding and acceptance.   Dental advisory given  Plan Discussed with: CRNA, Anesthesiologist and Surgeon  Anesthesia Plan Comments:         Anesthesia Quick Evaluation
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
## 2014-12-12 NOTE — Anesthesia Postprocedure Evaluation (Signed)
  Anesthesia Post-op Note  Patient: Lori [name]  Procedure(s) Performed: Procedure(s): ADJUSTMENT OF EXTERNAL FIXATION  RIGHT ANKLE (Right)  Patient Location: PACU  Anesthesia Type: General   Level of Consciousness: awake, alert  and oriented  Airway and Oxygen Therapy: Patient Spontanous Breathing  Post-op Pain: none  Post-op Assessment: Post-op Vital signs reviewed  Post-op Vital Signs: Reviewed  Last Vitals:  Filed Vitals:   12/12/14 1213  BP: 132/65  Pulse: 77  Temp: 36.4 C  Resp: 14    Complications: No apparent anesthesia complications
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
## 2014-12-12 NOTE — Transfer of Care (Signed)
Immediate Anesthesia Transfer of Care Note  Patient: Lori [name]  Procedure(s) Performed: Procedure(s): ADJUSTMENT OF EXTERNAL FIXATION  RIGHT ANKLE (Right)  Patient Location: PACU  Anesthesia Type:General  Level of Consciousness: awake, alert  and oriented  Airway & Oxygen Therapy: Patient Spontanous Breathing and Patient connected to nasal cannula oxygen  Post-op Assessment: Report given to RN and Post -op Vital signs reviewed and stable  Post vital signs: Reviewed and stable  Last Vitals:  Filed Vitals:   12/12/14 1123  BP:   Pulse:   Temp: 36.7 C  Resp:     Complications: No apparent anesthesia complications

## 2014-12-12 NOTE — Brief Op Note (Signed)
12/11/2014 - 12/12/2014  11:24 AM  PATIENT:  Lori [name]  81 y.o. female  PRE-OPERATIVE DIAGNOSIS:  external fixation right ankle  POST-OPERATIVE DIAGNOSIS:  external fixation right ankle  PROCEDURE:  Procedure(s): ADJUSTMENT OF EXTERNAL FIXATION  RIGHT ANKLE (Right)  SURGEON:  Surgeon(s) and Role:    * Rod Can, MD - Primary  PHYSICIAN ASSISTANT:   ASSISTANTS: none   ANESTHESIA:   general  EBL:     BLOOD ADMINISTERED:none  DRAINS: none   LOCAL MEDICATIONS USED:  NONE  SPECIMEN:  No Specimen  DISPOSITION OF SPECIMEN:  N/A  COUNTS:  YES  TOURNIQUET:  * No tourniquets in log *  DICTATION: .Other Dictation: Dictation Number pending  PLAN OF CARE: Admit to inpatient   PATIENT DISPOSITION:  PACU - hemodynamically stable.   Delay start of Pharmacological VTE agent (>24hrs) due to surgical blood loss or risk of bleeding: not applicable

## 2014-12-12 NOTE — Anesthesia Procedure Notes (Signed)
Procedure Name: LMA Insertion Date/Time: 12/12/2014 9:53 AM Performed by: Maryland Pink Pre-anesthesia Checklist: Patient identified, Emergency Drugs available, Suction available, Patient being monitored and Timeout performed Patient Re-evaluated:Patient Re-evaluated prior to inductionOxygen Delivery Method: Circle system utilized Preoxygenation: Pre-oxygenation with 100% oxygen Intubation Type: IV induction LMA: LMA inserted LMA Size: 4.0 Number of attempts: 1 Placement Confirmation: positive ETCO2 and breath sounds checked- equal and bilateral Tube secured with: Tape Dental Injury: Teeth and Oropharynx as per pre-operative assessment

## 2014-12-13 ENCOUNTER — Inpatient Hospital Stay (HOSPITAL_COMMUNITY): Payer: Medicare Other

## 2014-12-13 DIAGNOSIS — S82891A Other fracture of right lower leg, initial encounter for closed fracture: Secondary | ICD-10-CM

## 2014-12-13 DIAGNOSIS — S82899A Other fracture of unspecified lower leg, initial encounter for closed fracture: Secondary | ICD-10-CM | POA: Insufficient documentation

## 2014-12-13 MED ORDER — RIVAROXABAN 15 MG PO TABS: 15.0000 mg | ORAL_TABLET | Freq: Every day | ORAL | Status: DC

## 2014-12-13 MED ORDER — DOCUSATE SODIUM 100 MG PO CAPS: 100.0000 mg | ORAL_CAPSULE | Freq: Two times a day (BID) | ORAL | Status: DC

## 2014-12-13 MED ORDER — TRAMADOL HCL 50 MG PO TABS: 50.0000 mg | ORAL_TABLET | Freq: Four times a day (QID) | ORAL | Status: DC | PRN

## 2014-12-13 MED ORDER — RIVAROXABAN 20 MG PO TABS: 20.0000 mg | ORAL_TABLET | Freq: Every day | ORAL | Status: DC

## 2014-12-13 MED ORDER — ALPRAZOLAM 0.25 MG PO TABS: 0.2500 mg | ORAL_TABLET | Freq: Two times a day (BID) | ORAL | Status: DC | PRN

## 2014-12-13 MED ORDER — ACETAMINOPHEN 325 MG PO TABS: 650.0000 mg | ORAL_TABLET | Freq: Four times a day (QID) | ORAL | Status: DC | PRN

## 2014-12-13 MED ORDER — RIVAROXABAN 10 MG PO TABS: 15.0000 mg | ORAL_TABLET | Freq: Every day | ORAL | Status: DC

## 2014-12-13 NOTE — Progress Notes (Signed)
OT Cancellation Note  Patient Details Name: Lori [name] MRN: [id number] DOB: [birth year]   Cancelled Treatment:    Reason Eval/Treat Not Completed: OT screened.   Pt's current D/C plan is SNF. No apparent immediate acute care OT needs, therefore will defer OT to SNF. If OT eval is needed please call Acute Rehab Dept. at [phone] or text page OT at [phone].    Benito Mccreedy OTR/L [phone] 12/13/2014, 1:32 PM

## 2014-12-13 NOTE — Progress Notes (Signed)
Patient found on floor by daughter, contacted myself for assistance. Additional assistance in moving patient to be bed provided by Hoyle Sauer, RN and [place], NT on 12/12/14 at 2355. Patient in no distress, AO x 4, no complaints of pain and no apparent injury. Patient reports having a bad dream. Prior to fall patient was monitored by camera, bed alarms were set, side rails were raised, daughter at bedside sleep and patient was sleep upon hourly rounding. Jonette Eva, NP on-call contacted but referred to T. Rogue Bussing, NP whom was on-site. Portable x-ray of the right ankle ordered. Vital signs stable: T-97.20F (oral), BP-122/48, P-72, Resp 18 and 91% SpO2 RA. Will continue to monitor patient.
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
## 2014-12-13 NOTE — Clinical Social Work Note (Signed)
Clinical Social Worker facilitated patient discharge including contacting patient family and facility to confirm patient discharge plans.  Clinical information faxed to facility and family agreeable with plan.  Per RN, patient's dtr, [name] will be arriving from [place], [place] to transport patient to [facility]. CSW attempted to contact pt's dtr and unable to leave a message.  RN to call report prior to discharge.  DC packet prepared and on chart.  Clinical Social Worker will sign off for now as social work intervention is no longer needed. Please consult [place] again if new need arises.  Glendon Axe, MSW, LCSWA [phone] 12/13/2014 12:50 PM

## 2014-12-13 NOTE — Discharge Instructions (Signed)
Ankle Fracture A fracture is a break in a bone. The ankle joint is made up of three bones. These include the lower (distal)sections of your lower leg bones, called the tibia and fibula, along with a bone in your foot, called the talus. Depending on how bad the break is and if more than one ankle joint bone is broken, a cast or splint is used to protect and keep your injured bone from moving while it heals. Sometimes, surgery is required to help the fracture heal properly.  There are two general types of fractures:  Stable fracture. This includes a single fracture line through one bone, with no injury to ankle ligaments. A fracture of the talus that does not have any displacement (movement of the bone on either side of the fracture line) is also stable.  Unstable fracture. This includes more than one fracture line through one or more bones in the ankle joint. It also includes fractures that have displacement of the bone on either side of the fracture line. CAUSES  A direct blow to the ankle.   Quickly and severely twisting your ankle.  Trauma, such as a car accident or falling from a significant height. RISK FACTORS You may be at a higher risk of ankle fracture if:  You have certain medical conditions.  You are involved in high-impact sports.  You are involved in a high-impact car accident. SIGNS AND SYMPTOMS   Tender and swollen ankle.  Bruising around the injured ankle.  Pain on movement of the ankle.  Difficulty walking or putting weight on the ankle.  A cold foot below the site of the ankle injury. This can occur if the blood vessels passing through your injured ankle were also damaged.  Numbness in the foot below the site of the ankle injury. DIAGNOSIS  An ankle fracture is usually diagnosed with a physical exam and X-rays. A CT scan may also be required for complex fractures. TREATMENT  Stable fractures are treated with a cast or splint and using crutches to avoid putting  weight on your injured ankle. This is followed by an ankle strengthening program. Some patients require a special type of cast, depending on other medical problems they may have. Unstable fractures require surgery to ensure the bones heal properly. Your health care provider will tell you what type of fracture you have and the best treatment for your condition. HOME CARE INSTRUCTIONS   Review correct crutch use with your health care provider and use your crutches as directed. Safe use of crutches is extremely important. Misuse of crutches can cause you to fall or cause injury to nerves in your hands or armpits.  Do not put weight or pressure on the injured ankle until directed by your health care provider.  To lessen the swelling, keep the injured leg elevated while sitting or lying down.  Apply ice to the injured area:  Put ice in a plastic bag.  Place a towel between your cast and the bag.  Leave the ice on for 20 minutes, 2-3 times a day.  If you have a plaster or fiberglass cast:  Do not try to scratch the skin under the cast with any objects. This can increase your risk of skin infection.  Check the skin around the cast every day. You may put lotion on any red or sore areas.  Keep your cast dry and clean.  If you have a plaster splint:  Wear the splint as directed.  You may loosen the elastic  around the splint if your toes become numb, tingle, or turn cold or blue.  Do not put pressure on any part of your cast or splint; it may break. Rest your cast only on a pillow the first 24 hours until it is fully hardened.  Your cast or splint can be protected during bathing with a plastic bag sealed to your skin with medical tape. Do not lower the cast or splint into water.  Take medicines as directed by your health care provider. Only take over-the-counter or prescription medicines for pain, discomfort, or fever as directed by your health care provider.  Do not drive a vehicle until  your health care provider specifically tells you it is safe to do Lori [name].  If your health care provider has given you a follow-up appointment, it is very important to keep that appointment. Not keeping the appointment could result in a chronic or permanent injury, pain, and disability. If you have any problem keeping the appointment, call the facility for assistance. SEEK MEDICAL CARE IF: You develop increased swelling or discomfort. SEEK IMMEDIATE MEDICAL CARE IF:   Your cast gets damaged or breaks.  You have continued severe pain.  You develop new pain or swelling after the cast was put on.  Your skin or toenails below the injury turn blue or gray.  Your skin or toenails below the injury feel cold, numb, or have loss of sensitivity to touch.  There is a bad smell or pus draining from under the cast. MAKE SURE YOU:   Understand these instructions.  Will watch your condition.  Will get help right away if you are not doing well or get worse. Document Released: 09/02/2000 Document Revised: 09/10/2013 Document Reviewed: 04/04/2013 [facility] Patient Information 2015 [place], [place]. This information is not intended to replace advice given to you by your health care provider. Make sure you discuss any questions you have with your health care provider.   Strict Non weight bearing to Right Lower extremity.  Information on my medicine - XARELTO (Rivaroxaban)  This medication education was reviewed with me or my healthcare representative as part of my discharge preparation.    Why was Xarelto prescribed for you? Xarelto was prescribed for you to reduce the risk of a blood clot forming that can cause a stroke if you have a medical condition called atrial fibrillation (a type of irregular heartbeat).  What do you need to know about xarelto ? Take your Xarelto ONCE DAILY at the same time every day with your evening meal. If you have difficulty swallowing the tablet whole, you may crush it  and mix in applesauce just prior to taking your dose.  Take Xarelto exactly as prescribed by your doctor and DO NOT stop taking Xarelto without talking to the doctor who prescribed the medication.  Stopping without other stroke prevention medication to take the place of Xarelto may increase your risk of developing a clot that causes a stroke.  Refill your prescription before you run out.  After discharge, you should have regular check-up appointments with your healthcare provider that is prescribing your Xarelto.  In the future your dose may need to be changed if your kidney function or weight changes by a significant amount.  What do you do if you miss a dose? If you are taking Xarelto ONCE DAILY and you miss a dose, take it as soon as you remember on the same day then continue your regularly scheduled once daily regimen the next day. Do not take  two doses of Xarelto at the same time or on the same day.   Important Safety Information A possible side effect of Xarelto is bleeding. You should call your healthcare provider right away if you experience any of the following: ? Bleeding from an injury or your nose that does not stop. ? Unusual colored urine (red or dark brown) or unusual colored stools (red or black). ? Unusual bruising for unknown reasons. ? A serious fall or if you hit your head (even if there is no bleeding).  Some medicines may interact with Xarelto and might increase your risk of bleeding while on Xarelto. To help avoid this, consult your healthcare provider or pharmacist prior to using any new prescription or non-prescription medications, including herbals, vitamins, non-steroidal anti-inflammatory drugs (NSAIDs) and supplements.  This website has more information on Xarelto: [email or web address]

## 2014-12-13 NOTE — Progress Notes (Signed)
Patient declined am labs; lab will retry at a later time today.

## 2014-12-13 NOTE — Discharge Summary (Signed)
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 times daily. Qty: 2 each, Refills: 0    cephALEXin (KEFLEX) 500 MG capsule Take 1 capsule (500 mg total) by mouth 4 (four) times daily. Qty: 28 capsule, Refills: 0    Fluticasone Furoate-Vilanterol (BREO ELLIPTA) 200-25 MCG/INH AEPB Inhale 1 puff into the lungs daily. Qty: 60 each, Refills: 5    levalbuterol (XOPENEX HFA) 45 MCG/ACT inhaler Inhale 1-2 puffs into the lungs every 4 (four) hours as needed for wheezing. Qty: 1 Inhaler, Refills: 3    pravastatin (PRAVACHOL) 20 MG tablet Take 1 tablet (20 mg total) by mouth every evening. Qty: 30 tablet, Refills: 3   Associated Diagnoses: Paroxysmal atrial fibrillation       Follow-up Information  Follow up with Scarlette Calico, MD.   Specialty:  Internal Medicine   Why:  As needed   Contact information:   [address] [address] [phone]       Follow up with [name], MD.   Specialty:  Orthopedic Surgery   Why:  his office will set up appt for this Thursday   Contact information:   [address] [place]  [address] [phone]       TOTAL DISCHARGE TIME: 35 mins  [facility] Pager [phone]  12/13/2014, 12:15 PM

## 2014-12-15 ENCOUNTER — Encounter (HOSPITAL_COMMUNITY): Payer: Self-pay | Admitting: Orthopedic Surgery

## 2014-12-15 ENCOUNTER — Telehealth: Payer: Self-pay | Admitting: *Deleted

## 2014-12-15 NOTE — Telephone Encounter (Signed)
Pt was on TCM list d/c 12/13/14 had (R) ankle fracture will be f/u with Dr. Doran Durand (Orthopedic MD) on this Thursday 12/18/14...Lori [name]

## 2014-12-16 ENCOUNTER — Encounter: Payer: Self-pay | Admitting: Internal Medicine

## 2014-12-16 ENCOUNTER — Non-Acute Institutional Stay (SKILLED_NURSING_FACILITY): Payer: Medicare Other | Admitting: Internal Medicine

## 2014-12-16 DIAGNOSIS — J4520 Mild intermittent asthma, uncomplicated: Secondary | ICD-10-CM

## 2014-12-16 DIAGNOSIS — S82851D Displaced trimalleolar fracture of right lower leg, subsequent encounter for closed fracture with routine healing: Secondary | ICD-10-CM

## 2014-12-16 DIAGNOSIS — I482 Chronic atrial fibrillation, unspecified: Secondary | ICD-10-CM

## 2014-12-16 DIAGNOSIS — M75101 Unspecified rotator cuff tear or rupture of right shoulder, not specified as traumatic: Secondary | ICD-10-CM

## 2014-12-16 DIAGNOSIS — M810 Age-related osteoporosis without current pathological fracture: Secondary | ICD-10-CM

## 2014-12-16 DIAGNOSIS — J449 Chronic obstructive pulmonary disease, unspecified: Secondary | ICD-10-CM

## 2014-12-16 DIAGNOSIS — F418 Other specified anxiety disorders: Secondary | ICD-10-CM
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
## 2014-12-23 ENCOUNTER — Encounter (HOSPITAL_BASED_OUTPATIENT_CLINIC_OR_DEPARTMENT_OTHER): Payer: Self-pay | Admitting: *Deleted
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
## 2014-12-24 DIAGNOSIS — S82851D Displaced trimalleolar fracture of right lower leg, subsequent encounter for closed fracture with routine healing: Secondary | ICD-10-CM | POA: Diagnosis not present

## 2014-12-25 ENCOUNTER — Encounter (HOSPITAL_BASED_OUTPATIENT_CLINIC_OR_DEPARTMENT_OTHER): Payer: Self-pay | Admitting: Certified Registered"

## 2014-12-25 ENCOUNTER — Ambulatory Visit (HOSPITAL_BASED_OUTPATIENT_CLINIC_OR_DEPARTMENT_OTHER): Payer: Medicare Other | Admitting: Certified Registered"

## 2014-12-25 ENCOUNTER — Encounter (HOSPITAL_BASED_OUTPATIENT_CLINIC_OR_DEPARTMENT_OTHER)
Admission: RE | Disposition: A | Discharge status: Other Acute Inpatient Hospital | Payer: Self-pay | Source: Ambulatory Visit | Attending: Orthopedic Surgery

## 2014-12-25 ENCOUNTER — Ambulatory Visit (HOSPITAL_BASED_OUTPATIENT_CLINIC_OR_DEPARTMENT_OTHER)
Admission: RE | Admit: 2014-12-25 | Discharge: 2014-12-25 | Payer: Medicare Other | Source: Ambulatory Visit | Attending: Orthopedic Surgery | Admitting: Orthopedic Surgery

## 2014-12-25 DIAGNOSIS — S82851A Displaced trimalleolar fracture of right lower leg, initial encounter for closed fracture: Secondary | ICD-10-CM | POA: Insufficient documentation

## 2014-12-25 DIAGNOSIS — G4700 Insomnia, unspecified: Secondary | ICD-10-CM | POA: Insufficient documentation

## 2014-12-25 DIAGNOSIS — S9304XA Dislocation of right ankle joint, initial encounter: Secondary | ICD-10-CM | POA: Diagnosis not present

## 2014-12-25 DIAGNOSIS — J449 Chronic obstructive pulmonary disease, unspecified: Secondary | ICD-10-CM | POA: Diagnosis not present

## 2014-12-25 DIAGNOSIS — F419 Anxiety disorder, unspecified: Secondary | ICD-10-CM | POA: Diagnosis not present

## 2014-12-25 DIAGNOSIS — Z886 Allergy status to analgesic agent status: Secondary | ICD-10-CM | POA: Insufficient documentation

## 2014-12-25 DIAGNOSIS — K589 Irritable bowel syndrome without diarrhea: Secondary | ICD-10-CM | POA: Diagnosis not present

## 2014-12-25 DIAGNOSIS — Z880 Allergy status to penicillin: Secondary | ICD-10-CM | POA: Diagnosis not present

## 2014-12-25 DIAGNOSIS — Z87891 Personal history of nicotine dependence: Secondary | ICD-10-CM | POA: Diagnosis not present

## 2014-12-25 DIAGNOSIS — I10 Essential (primary) hypertension: Secondary | ICD-10-CM | POA: Insufficient documentation

## 2014-12-25 DIAGNOSIS — Z79899 Other long term (current) drug therapy: Secondary | ICD-10-CM | POA: Insufficient documentation

## 2014-12-25 DIAGNOSIS — G8918 Other acute postprocedural pain: Secondary | ICD-10-CM | POA: Diagnosis not present

## 2014-12-25 DIAGNOSIS — Z882 Allergy status to sulfonamides status: Secondary | ICD-10-CM | POA: Insufficient documentation

## 2014-12-25 DIAGNOSIS — S82891A Other fracture of right lower leg, initial encounter for closed fracture: Secondary | ICD-10-CM | POA: Diagnosis not present

## 2014-12-25 DIAGNOSIS — Z9049 Acquired absence of other specified parts of digestive tract: Secondary | ICD-10-CM | POA: Insufficient documentation

## 2014-12-25 DIAGNOSIS — M25572 Pain in left ankle and joints of left foot: Secondary | ICD-10-CM | POA: Diagnosis not present

## 2014-12-25 DIAGNOSIS — S82851D Displaced trimalleolar fracture of right lower leg, subsequent encounter for closed fracture with routine healing: Secondary | ICD-10-CM | POA: Diagnosis not present

## 2014-12-25 HISTORY — PX: ORIF ANKLE FRACTURE: SHX5408

## 2014-12-25 HISTORY — PX: HARDWARE REMOVAL: SHX979

## 2014-12-25 LAB — POCT HEMOGLOBIN-HEMACUE: Hemoglobin: 11.2 g/dL — ABNORMAL LOW (ref 12.0–15.0)

## 2014-12-25 SURGERY — REMOVAL, HARDWARE
Anesthesia: General | Site: Ankle | Laterality: Right

## 2014-12-25 MED ORDER — CHLORHEXIDINE GLUCONATE 4 % EX LIQD: 60.0000 mL | Freq: Once | CUTANEOUS | Status: DC

## 2014-12-25 MED ORDER — DEXAMETHASONE SODIUM PHOSPHATE 10 MG/ML IJ SOLN
INTRAMUSCULAR | Status: DC | PRN
  Administered 2014-12-25: 6 mg via INTRAVENOUS

## 2014-12-25 MED ORDER — BACITRACIN ZINC 500 UNIT/GM EX OINT
TOPICAL_OINTMENT | CUTANEOUS | Status: DC | PRN
  Administered 2014-12-25: 1 via TOPICAL

## 2014-12-25 MED ORDER — LACTATED RINGERS IV SOLN
INTRAVENOUS | Status: DC
  Administered 2014-12-25 (×2): via INTRAVENOUS

## 2014-12-25 MED ORDER — FENTANYL CITRATE 0.05 MG/ML IJ SOLN
INTRAMUSCULAR | Status: DC | PRN
  Administered 2014-12-25 (×2): 25 ug via INTRAVENOUS
  Administered 2014-12-25: 50 ug via INTRAVENOUS

## 2014-12-25 MED ORDER — ONDANSETRON HCL 4 MG/2ML IJ SOLN
INTRAMUSCULAR | Status: DC | PRN
  Administered 2014-12-25: 4 mg via INTRAVENOUS

## 2014-12-25 MED ORDER — CEFAZOLIN SODIUM-DEXTROSE 2-3 GM-% IV SOLR
2.0000 g | INTRAVENOUS | Status: AC
  Administered 2014-12-25: 2 g via INTRAVENOUS

## 2014-12-25 MED ORDER — FENTANYL CITRATE 0.05 MG/ML IJ SOLN
INTRAMUSCULAR | Status: AC
  Filled 2014-12-25: qty 2

## 2014-12-25 MED ORDER — BUPIVACAINE HCL (PF) 0.5 % IJ SOLN
INTRAMUSCULAR | Status: DC | PRN
  Administered 2014-12-25: 27 mL

## 2014-12-25 MED ORDER — MIDAZOLAM HCL 2 MG/2ML IJ SOLN
INTRAMUSCULAR | Status: AC
  Filled 2014-12-25: qty 2

## 2014-12-25 MED ORDER — OXYCODONE HCL 5 MG PO TABS: 5.0000 mg | ORAL_TABLET | Freq: Four times a day (QID) | ORAL | Status: DC | PRN

## 2014-12-25 MED ORDER — FENTANYL CITRATE 0.05 MG/ML IJ SOLN
INTRAMUSCULAR | Status: AC
  Filled 2014-12-25: qty 6

## 2014-12-25 MED ORDER — OXYCODONE HCL 5 MG/5ML PO SOLN: 5.0000 mg | Freq: Once | ORAL | Status: DC | PRN

## 2014-12-25 MED ORDER — FENTANYL CITRATE 0.05 MG/ML IJ SOLN
50.0000 ug | INTRAMUSCULAR | Status: DC | PRN
  Administered 2014-12-25: 50 ug via INTRAVENOUS

## 2014-12-25 MED ORDER — PROPOFOL 10 MG/ML IV BOLUS
INTRAVENOUS | Status: DC | PRN
  Administered 2014-12-25: 100 mg via INTRAVENOUS

## 2014-12-25 MED ORDER — 0.9 % SODIUM CHLORIDE (POUR BTL) OPTIME
TOPICAL | Status: DC | PRN
  Administered 2014-12-25: 1000 mL

## 2014-12-25 MED ORDER — CEFAZOLIN SODIUM-DEXTROSE 2-3 GM-% IV SOLR
INTRAVENOUS | Status: AC
  Filled 2014-12-25: qty 50

## 2014-12-25 MED ORDER — FENTANYL CITRATE 0.05 MG/ML IJ SOLN: 25.0000 ug | INTRAMUSCULAR | Status: DC | PRN

## 2014-12-25 MED ORDER — MIDAZOLAM HCL 2 MG/2ML IJ SOLN
1.0000 mg | INTRAMUSCULAR | Status: DC | PRN
  Administered 2014-12-25: 0.5 mg via INTRAVENOUS

## 2014-12-25 MED ORDER — OXYCODONE HCL 5 MG PO TABS: 5.0000 mg | ORAL_TABLET | Freq: Once | ORAL | Status: DC | PRN

## 2014-12-25 MED ORDER — EPHEDRINE SULFATE 50 MG/ML IJ SOLN
INTRAMUSCULAR | Status: DC | PRN
  Administered 2014-12-25 (×2): 10 mg via INTRAVENOUS

## 2014-12-25 MED ORDER — LIDOCAINE HCL (CARDIAC) 20 MG/ML IV SOLN
INTRAVENOUS | Status: DC | PRN
  Administered 2014-12-25: 70 mg via INTRAVENOUS

## 2014-12-25 MED ORDER — ONDANSETRON HCL 4 MG/2ML IJ SOLN: 4.0000 mg | Freq: Once | INTRAMUSCULAR | Status: DC | PRN

## 2014-12-25 MED ORDER — ASPIRIN EC 325 MG PO TBEC: 325.0000 mg | DELAYED_RELEASE_TABLET | Freq: Every day | ORAL | Status: DC

## 2014-12-25 SURGICAL SUPPLY — 81 items
BAG DECANTER FOR FLEXI CONT (MISCELLANEOUS) IMPLANT
BANDAGE ELASTIC 4 VELCRO ST LF (GAUZE/BANDAGES/DRESSINGS) IMPLANT
BANDAGE ESMARK 6X9 LF (GAUZE/BANDAGES/DRESSINGS) ×1 IMPLANT
BIT DRILL 2.5X2.75 QC CALB (BIT) ×1 IMPLANT
BLADE SURG 15 STRL LF DISP TIS (BLADE) ×2 IMPLANT
BLADE SURG 15 STRL SS (BLADE) ×6
BNDG CMPR 9X4 STRL LF SNTH (GAUZE/BANDAGES/DRESSINGS)
BNDG CMPR 9X6 STRL LF SNTH (GAUZE/BANDAGES/DRESSINGS) ×1
BNDG COHESIVE 4X5 TAN STRL (GAUZE/BANDAGES/DRESSINGS) ×2 IMPLANT
BNDG COHESIVE 6X5 TAN STRL LF (GAUZE/BANDAGES/DRESSINGS) ×2 IMPLANT
BNDG ESMARK 4X9 LF (GAUZE/BANDAGES/DRESSINGS) IMPLANT
BNDG ESMARK 6X9 LF (GAUZE/BANDAGES/DRESSINGS) ×2
CANISTER SUCT 1200ML W/VALVE (MISCELLANEOUS) ×2 IMPLANT
CHLORAPREP W/TINT 26ML (MISCELLANEOUS) ×2 IMPLANT
COVER BACK TABLE 60X90IN (DRAPES) ×2 IMPLANT
CUFF TOURNIQUET SINGLE 34IN LL (TOURNIQUET CUFF) ×2 IMPLANT
DECANTER SPIKE VIAL GLASS SM (MISCELLANEOUS) IMPLANT
DRAPE C-ARM 42X72 X-RAY (DRAPES) IMPLANT
DRAPE C-ARMOR (DRAPES) IMPLANT
DRAPE EXTREMITY T 121X128X90 (DRAPE) ×2 IMPLANT
DRAPE OEC MINIVIEW 54X84 (DRAPES) ×2 IMPLANT
DRAPE SURG 17X23 STRL (DRAPES) IMPLANT
DRAPE U-SHAPE 47X51 STRL (DRAPES) ×2 IMPLANT
DRSG ADAPTIC 3X8 NADH LF (GAUZE/BANDAGES/DRESSINGS) ×1 IMPLANT
DRSG EMULSION OIL 3X3 NADH (GAUZE/BANDAGES/DRESSINGS) ×1 IMPLANT
DRSG PAD ABDOMINAL 8X10 ST (GAUZE/BANDAGES/DRESSINGS) ×5 IMPLANT
ELECT REM PT RETURN 9FT ADLT (ELECTROSURGICAL) ×2
ELECTRODE REM PT RTRN 9FT ADLT (ELECTROSURGICAL) ×1 IMPLANT
GAUZE SPONGE 4X4 12PLY STRL (GAUZE/BANDAGES/DRESSINGS) ×2 IMPLANT
GLOVE BIO SURGEON STRL SZ8 (GLOVE) ×2 IMPLANT
GLOVE BIOGEL PI IND STRL 7.0 (GLOVE) IMPLANT
GLOVE BIOGEL PI IND STRL 8 (GLOVE) ×1 IMPLANT
GLOVE BIOGEL PI INDICATOR 7.0 (GLOVE) ×1
GLOVE BIOGEL PI INDICATOR 8 (GLOVE) ×1
GLOVE ECLIPSE 6.5 STRL STRAW (GLOVE) ×1 IMPLANT
GLOVE EXAM NITRILE MD LF STRL (GLOVE) IMPLANT
GOWN STRL REUS W/ TWL LRG LVL3 (GOWN DISPOSABLE) ×1 IMPLANT
GOWN STRL REUS W/ TWL XL LVL3 (GOWN DISPOSABLE) ×1 IMPLANT
GOWN STRL REUS W/TWL LRG LVL3 (GOWN DISPOSABLE) ×2
GOWN STRL REUS W/TWL XL LVL3 (GOWN DISPOSABLE) ×4
K-WIRE ACE 1.6X6 (WIRE) ×4
KWIRE ACE 1.6X6 (WIRE) IMPLANT
NEEDLE HYPO 22GX1.5 SAFETY (NEEDLE) ×1 IMPLANT
NS IRRIG 1000ML POUR BTL (IV SOLUTION) ×2 IMPLANT
PACK BASIN DAY SURGERY FS (CUSTOM PROCEDURE TRAY) ×2 IMPLANT
PAD CAST 4YDX4 CTTN HI CHSV (CAST SUPPLIES) ×1 IMPLANT
PADDING CAST ABS 4INX4YD NS (CAST SUPPLIES)
PADDING CAST ABS COTTON 4X4 ST (CAST SUPPLIES) IMPLANT
PADDING CAST COTTON 4X4 STRL (CAST SUPPLIES) ×2
PADDING CAST COTTON 6X4 STRL (CAST SUPPLIES) ×2 IMPLANT
PENCIL BUTTON HOLSTER BLD 10FT (ELECTRODE) ×2 IMPLANT
PLATE LOCK 8H 103 BILAT FIB (Plate) ×1 IMPLANT
PLATE SPIDER 16 (Washer) ×1 IMPLANT
SANITIZER HAND PURELL 535ML FO (MISCELLANEOUS) ×2 IMPLANT
SCREW ACE CAN 4.0 38M (Screw) ×1 IMPLANT
SCREW ACE CAN 4.0 40M (Screw) ×3 IMPLANT
SCREW LOCK CORT STAR 3.5X10 (Screw) ×1 IMPLANT
SCREW LOCK CORT STAR 3.5X12 (Screw) ×2 IMPLANT
SCREW LOCK CORT STAR 3.5X16 (Screw) ×1 IMPLANT
SCREW NON LOCKING LP 3.5 16MM (Screw) ×2 IMPLANT
SHEET MEDIUM DRAPE 40X70 STRL (DRAPES) ×3 IMPLANT
SLEEVE SCD COMPRESS KNEE MED (MISCELLANEOUS) ×2 IMPLANT
SPLINT FAST PLASTER 5X30 (CAST SUPPLIES) ×20
SPLINT PLASTER CAST FAST 5X30 (CAST SUPPLIES) ×20 IMPLANT
SPONGE LAP 18X18 X RAY DECT (DISPOSABLE) ×3 IMPLANT
STOCKINETTE 6  STRL (DRAPES) ×1
STOCKINETTE 6 STRL (DRAPES) ×1 IMPLANT
SUCTION FRAZIER TIP 10 FR DISP (SUCTIONS) ×2 IMPLANT
SUT ETHILON 3 0 PS 1 (SUTURE) ×3 IMPLANT
SUT FIBERWIRE #2 38 T-5 BLUE (SUTURE)
SUT MNCRL AB 3-0 PS2 18 (SUTURE) ×3 IMPLANT
SUT VIC AB 0 SH 27 (SUTURE) IMPLANT
SUT VIC AB 2-0 SH 27 (SUTURE)
SUT VIC AB 2-0 SH 27XBRD (SUTURE) IMPLANT
SUT VICRYL 4-0 PS2 18IN ABS (SUTURE) IMPLANT
SUTURE FIBERWR #2 38 T-5 BLUE (SUTURE) IMPLANT
SYR BULB 3OZ (MISCELLANEOUS) ×2 IMPLANT
SYR CONTROL 10ML LL (SYRINGE) ×1 IMPLANT
TOWEL OR 17X24 6PK STRL BLUE (TOWEL DISPOSABLE) ×4 IMPLANT
TUBE CONNECTING 20X1/4 (TUBING) ×2 IMPLANT
UNDERPAD 30X30 INCONTINENT (UNDERPADS AND DIAPERS) ×2 IMPLANT

## 2014-12-25 NOTE — Anesthesia Procedure Notes (Addendum)
Anesthesia Regional Block:  Popliteal block  Pre-Anesthetic Checklist: ,, timeout performed, Correct Patient, Correct Site, Correct Laterality, Correct Procedure, Correct Position, site marked, Risks and benefits discussed,  Surgical consent,  Pre-op evaluation,  At surgeon's request and post-op pain management  Laterality: Left  Prep: chloraprep       Needles:  Injection technique: Single-shot  Needle Type: Echogenic Stimulator Needle     Needle Length: 9cm 9 cm Needle Gauge: 22 and 22 G    Additional Needles:  Procedures: ultrasound guided (picture in chart) Popliteal block Narrative:  Start time: 12/25/2014 9:15 AM End time: 12/25/2014 9:20 AM Injection made incrementally with aspirations every 5 mL.  Performed by: Personally   Additional Notes: 30 cc 0.5% Bupivacaine with 1:200 Epi injected easily   Procedure Name: LMA Insertion Date/Time: 12/25/2014 10:17 AM Performed by: BLOCKER, TIMOTHY D Pre-anesthesia Checklist: Patient identified, Emergency Drugs available, Suction available and Patient being monitored Patient Re-evaluated:Patient Re-evaluated prior to inductionOxygen Delivery Method: Circle System Utilized Preoxygenation: Pre-oxygenation with 100% oxygen Intubation Type: IV induction Ventilation: Mask ventilation without difficulty LMA: LMA inserted LMA Size: 4.0 Number of attempts: 1 Airway Equipment and Method: Bite block Placement Confirmation: positive ETCO2 Tube secured with: Tape Dental Injury: Teeth and Oropharynx as per pre-operative assessment

## 2014-12-25 NOTE — Anesthesia Preprocedure Evaluation (Addendum)
Anesthesia Evaluation  Patient identified by MRN, date of birth, ID band Patient awake    Reviewed: Allergy & Precautions, NPO status , Patient's Chart, lab work & pertinent test results  Airway Mallampati: II  TM Distance: >3 FB Neck ROM: Full    Dental  (+) Teeth Intact, Dental Advisory Given   Pulmonary former smoker,  breath sounds clear to auscultation        Cardiovascular hypertension, Rhythm:Regular Rate:Normal     Neuro/Psych    GI/Hepatic   Endo/Other    Renal/GU      Musculoskeletal   Abdominal   Peds  Hematology   Anesthesia Other Findings   Reproductive/Obstetrics                            Anesthesia Physical Anesthesia Plan  ASA: III  Anesthesia Plan: General   Post-op Pain Management: MAC Combined w/ Regional for Post-op pain   Induction: Intravenous  Airway Management Planned: LMA  Additional Equipment:   Intra-op Plan:   Post-operative Plan:   Informed Consent: I have reviewed the patients History and Physical, chart, labs and discussed the procedure including the risks, benefits and alternatives for the proposed anesthesia with the patient or authorized representative who has indicated his/her understanding and acceptance.   Dental advisory given  Plan Discussed with: CRNA and Anesthesiologist  Anesthesia Plan Comments: (H/O Afib now in SR Hypertension COPD Anxiety  Plan GA with LMA and popliteal block)        Anesthesia Quick Evaluation

## 2014-12-25 NOTE — Anesthesia Postprocedure Evaluation (Signed)
  Anesthesia Post-op Note  Patient: Lori [name]  Procedure(s) Performed: Procedure(s): RIGHT ANKLE REMOVE OF DEEP IMPLANT ANE EXTERNAL FIXATOR  (Right) OPEN REDUCTION INTERNAL FIXATION (ORIF)TRIMALLEOLAR  ANKLE FRACTURE (Right)  Patient Location: PACU  Anesthesia Type:General and block  Level of Consciousness: awake and alert   Airway and Oxygen Therapy: Patient Spontanous Breathing  Post-op Pain: none  Post-op Assessment: Post-op Vital signs reviewed, Patient's Cardiovascular Status Stable and Respiratory Function Stable  Post-op Vital Signs: Reviewed  Filed Vitals:   12/25/14 1300  BP: 142/51  Pulse: 87  Temp:   Resp: 15    Complications: No apparent anesthesia complications

## 2014-12-25 NOTE — H&P (Signed)
Lori [name] is an 81 y.o. female.   Chief Complaint:  Right ankle fracture HPI: 81 y/o female with right ankle fracture dislocation s/p closed reduction and external fixation.  She presents now for removal of the ex fix and ORIF of the ankle fracture.  Past Medical History  Diagnosis Date  . COPD (chronic obstructive pulmonary disease)   . Asthma   . ALLERGIC RHINITIS   . Hypertension   . Pleural effusion, left   . Colles' fracture of left radius   . Anxiety   . Back pain of thoracolumbar region     right  . Pruritus   . Diastolic dysfunction   . Pulmonary nodule   . IBS (irritable bowel syndrome)   . Bronchiectasis   . Insomnia   . History of pneumonia   . Diverticulosis   . Hx of colonic polyp   . Multiple rib fractures 10/2008    bilateral  . Hx of cardiovascular stress test     a. ETT-MV 1/14: Exercised 4:16, no ECG changes, poor ex tol, ant defect c/w soft tissue atten, no ischemia, EF 75%    Past Surgical History  Procedure Laterality Date  . Tonsillectomy    . Appendectomy    . Cholecystectomy    . Polypectomy    . Bladder suspension  2005    A-P  . Rotator cuff repair Right 2006  . Orif wrist fracture Left 2010      Dr. Burney Gauze.  . Total knee arthroplasty Right 08/2010  . Tonsillectomy    . Colonoscopy  04/06/2012    Procedure: COLONOSCOPY;  Surgeon: Jerene Bears, MD;  Location: [facility];  Service: Gastroenterology;  Laterality: N/A;  . External fixation leg Right 12/12/2014    Procedure: ADJUSTMENT OF EXTERNAL FIXATION  RIGHT ANKLE;  Surgeon: Rod Can, MD;  Location: [place];  Service: Orthopedics;  Laterality: Right;  . Ankle closed reduction Right 12/11/2014    Procedure: CLOSED REDUCTION ANKLE;  Surgeon: Rod Can, MD;  Location: [place];  Service: Orthopedics;  Laterality: Right;  . External fixation leg Right 12/11/2014    Procedure: POSSIBLE EXTERNAL FIXATION RIGHT ANKLE;  Surgeon: Rod Can, MD;  Location: [place];  Service: Orthopedics;   Laterality: Right;    Family History  Problem Relation Age of Onset  . Heart disease Mother   . Colon cancer Neg Hx   . Bipolar disorder Daughter   . Rheumatic fever Mother   . Pneumonia Father    Social History:  reports that she quit smoking about 66 years ago. Her smoking use included Cigarettes. She has a 3 pack-year smoking history. She has never used smokeless tobacco. She reports that she does not drink alcohol or use illicit drugs.  Allergies:  Allergies  Allergen Reactions  . Albuterol Sulfate Palpitations  . Levaquin [Levofloxacin In D5w] Other (See Comments)    QT prolongation. Should avoid all flouroquinolones.   . Doxycycline Diarrhea  . Lactose Intolerance (Gi) Other (See Comments)  . Amoxicillin Other (See Comments)    REACTION: unspecified  . Codeine Other (See Comments)    Can take Hydrocodone  . Penicillins Hives, Itching and Rash  . Sulfa Antibiotics Nausea Only  . Sulfonamide Derivatives Nausea Only    No prescriptions prior to admission    No results found for this or any previous visit (from the past 48 hour(s)). No results found.  ROS  No recent f/c/n/v/wt loss  Height 5\' 4"  (1.626 m), weight 65.772 kg (  145 lb). Physical Exam  Elderly woman in nad.  A and O x 4.  Mood and affect normal.  EOMI.  resp unlabored.  R ankle with ex fix in place.  Plantar wound healing well.  Sens to LT intact at the ankle.  No lymphadenopathy.  Brisk cap refill at the forefoot.  Ex fix in place.  Assessment/Plan R ankle trimal fracture dislocation s/p closed reduction and external fixation - to OR for removal of external fixator and trans articular pin followed by ORIF of the ankle fracture.  The risks and benefits of the alternative treatment options have been discussed in detail.  The patient wishes to proceed with surgery and specifically understands risks of bleeding, infection, nerve damage, blood clots, need for additional surgery, amputation and death.   [name] 12/27/2014, 7:27 AM

## 2014-12-25 NOTE — Progress Notes (Signed)
Assisted Dr. Joslin with right, ultrasound guided, popliteal block. Side rails up, monitors on throughout procedure. See vital signs in flow sheet. Tolerated Procedure well. 

## 2014-12-25 NOTE — Brief Op Note (Signed)
12/25/2014  12:21 PM  PATIENT:  Lori [name]  81 y.o. female  PRE-OPERATIVE DIAGNOSIS:  RIGHT ANKLE trimal FRACTURE DISLOCATION s/p closed reduction and external fixation  POST-OPERATIVE DIAGNOSIS:  same  Procedure(s): 1.  Removal of ex fix under anesthesia 2.  Removal of deep implant from plantar foot 3.  ORIF of right ankle trimal fracture with fixation of posterior lip 4.  3 view xrays of the right ankle  SURGEON:  [name], MD  ASSISTANT: n/a  ANESTHESIA:   General, regional  EBL:  minimal   TOURNIQUET:   Total Tourniquet Time Documented: Thigh (Right) - 80 minutes Total: Thigh (Right) - 80 minutes  COMPLICATIONS:  None apparent  DISPOSITION:  Extubated, awake and stable to recovery.  DICTATION ID:

## 2014-12-25 NOTE — Transfer of Care (Signed)
Immediate Anesthesia Transfer of Care Note  Patient: Everlyn Farabaugh Noyes  Procedure(s) Performed: Procedure(s): RIGHT ANKLE REMOVE OF DEEP IMPLANT ANE EXTERNAL FIXATOR  (Right) OPEN REDUCTION INTERNAL FIXATION (ORIF)TRIMALLEOLAR  ANKLE FRACTURE (Right)  Patient Location: PACU  Anesthesia Type:GA combined with regional for post-op pain  Level of Consciousness: awake and patient cooperative  Airway & Oxygen Therapy: Patient Spontanous Breathing and Patient connected to face mask oxygen  Post-op Assessment: Report given to RN and Post -op Vital signs reviewed and stable  Post vital signs: Reviewed and stable  Last Vitals:  Filed Vitals:   12/25/14 0915  BP:   Pulse: 70  Temp:   Resp: 22    Complications: No apparent anesthesia complications

## 2014-12-25 NOTE — Discharge Instructions (Addendum)
Lori Simmer, MD [place]  Please read the following information regarding your care after surgery.  Medications  You only need a prescription for the narcotic pain medicine (ex. oxycodone, Percocet, Norco).  All of the other medicines listed below are available over the counter. X acetominophen (Tylenol) 650 mg every 4-6 hours as you need for minor pain X oxycodone as prescribed for moderate to severe pain   Narcotic pain medicine (ex. oxycodone, Percocet, Vicodin) will cause constipation.  To prevent this problem, take the following medicines while you are taking any pain medicine. X docusate sodium (Colace) 100 mg twice a day X senna (Senokot) 2 tablets twice a day  X To help prevent blood clots, take an aspirin (325 mg) once a day for a month after surgery.  You should also get up every hour while you are awake to move around.    Weight Bearing ? Bear weight when you are able on your operated leg or foot. ? Bear weight only on the heel of your operated foot in the post-op shoe. X Do not bear any weight on the operated leg or foot.  Cast / Splint / Dressing X Keep your splint or cast clean and dry.  Dont put anything (coat hanger, pencil, etc) down inside of it.  If it gets damp, use a hair dryer on the cool setting to dry it.  If it gets soaked, call the office to schedule an appointment for a cast change. ? Remove your dressing 3 days after surgery and cover the incisions with dry dressings.    After your dressing, cast or splint is removed; you may shower, but do not soak or scrub the wound.  Allow the water to run over it, and then gently pat it dry.  Swelling It is normal for you to have swelling where you had surgery.  To reduce swelling and pain, keep your toes above your nose for at least 3 days after surgery.  It may be necessary to keep your foot or leg elevated for several weeks.  If it hurts, it should be elevated.  Follow Up Call my office at [phone]  when you are discharged from the hospital or surgery center to schedule an appointment to be seen two weeks after surgery.  Call my office at [phone] if you develop a fever >101.5 F, nausea, vomiting, bleeding from the surgical site or severe pain.      Post Anesthesia Home Care Instructions  Activity: Get plenty of rest for the remainder of the day. A responsible adult should stay with you for 24 hours following the procedure.  For the next 24 hours, DO NOT: -Drive a car -Paediatric nurse -Drink alcoholic beverages -Take any medication unless instructed by your physician -Make any legal decisions or sign important papers.  Meals: Start with liquid foods such as gelatin or soup. Progress to regular foods as tolerated. Avoid greasy, spicy, heavy foods. If nausea and/or vomiting occur, drink only clear liquids until the nausea and/or vomiting subsides. Call your physician if vomiting continues.  Special Instructions/Symptoms: Your throat may feel dry or sore from the anesthesia or the breathing tube placed in your throat during surgery. If this causes discomfort, gargle with warm salt water. The discomfort should disappear within 24 hours.  If you had a scopolamine patch placed behind your ear for the management of post- operative nausea and/or vomiting:  1. The medication in the patch is effective for 72 hours, after which it should be removed.  Wrap patch in a tissue and discard in the trash. Wash hands thoroughly with soap and water. 2. You may remove the patch earlier than 72 hours if you experience unpleasant side effects which may include dry mouth, dizziness or visual disturbances. 3. Avoid touching the patch. Wash your hands with soap and water after contact with the patch.    Regional Anesthesia Blocks  1. Numbness or the inability to move the "blocked" extremity may last from 3-48 hours after placement. The length of time depends on the medication injected and your  individual response to the medication. If the numbness is not going away after 48 hours, call your surgeon.  2. The extremity that is blocked will need to be protected until the numbness is gone and the  Strength has returned. Because you cannot feel it, you will need to take extra care to avoid injury. Because it may be weak, you may have difficulty moving it or using it. You may not know what position it is in without looking at it while the block is in effect.  3. For blocks in the legs and feet, returning to weight bearing and walking needs to be done carefully. You will need to wait until the numbness is entirely gone and the strength has returned. You should be able to move your leg and foot normally before you try and bear weight or walk. You will need someone to be with you when you first try to ensure you do not fall and possibly risk injury.  4. Bruising and tenderness at the needle site are common side effects and will resolve in a few days.  5. Persistent numbness or new problems with movement should be communicated to the surgeon or the [place] [phone] [place] [phone]).

## 2014-12-26 ENCOUNTER — Encounter (HOSPITAL_COMMUNITY): Payer: Self-pay | Admitting: Orthopedic Surgery
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
## 2014-12-27 NOTE — Anesthesia Postprocedure Evaluation (Signed)
  Anesthesia Post-op Note  Patient: Lucita Lora Fredin  Procedure(s) Performed: Procedure(s): RIGHT ANKLE REMOVE OF DEEP IMPLANT ANE EXTERNAL FIXATOR  (Right) OPEN REDUCTION INTERNAL FIXATION (ORIF)TRIMALLEOLAR  ANKLE FRACTURE (Right)  Patient Location: PACU  Anesthesia Type:General and GA combined with regional for post-op pain  Level of Consciousness: awake, alert  and oriented  Airway and Oxygen Therapy: Patient Spontanous Breathing and Patient connected to nasal cannula oxygen  Post-op Pain: none  Post-op Assessment: Post-op Vital signs reviewed, Patient's Cardiovascular Status Stable, Respiratory Function Stable, Patent Airway and Pain level controlled  Post-op Vital Signs: stable  Last Vitals:  Filed Vitals:   12/25/14 1345  BP: 151/77  Pulse:   Temp: 36.7 C  Resp: 20    Complications: No apparent anesthesia complications

## 2014-12-29 ENCOUNTER — Other Ambulatory Visit: Payer: Self-pay | Admitting: *Deleted

## 2014-12-29 MED ORDER — TRAMADOL HCL 50 MG PO TABS
ORAL_TABLET | ORAL | Status: DC
Start: 2014-12-29 — End: 2015-03-09

## 2014-12-29 NOTE — Telephone Encounter (Signed)
[facility] 

## 2014-12-30 DIAGNOSIS — R197 Diarrhea, unspecified: Secondary | ICD-10-CM | POA: Diagnosis not present

## 2014-12-30 DIAGNOSIS — R110 Nausea: Secondary | ICD-10-CM | POA: Diagnosis not present

## 2015-01-02 DIAGNOSIS — R197 Diarrhea, unspecified: Secondary | ICD-10-CM | POA: Diagnosis not present

## 2015-01-03 DIAGNOSIS — R0989 Other specified symptoms and signs involving the circulatory and respiratory systems: Secondary | ICD-10-CM | POA: Diagnosis not present

## 2015-01-07 ENCOUNTER — Ambulatory Visit: Payer: 59 | Admitting: Cardiology

## 2015-01-08 LAB — BASIC METABOLIC PANEL
BUN: 19 mg/dL (ref 4–21)
Creatinine: 0.6 mg/dL (ref 0.5–1.1)
GLUCOSE: 85 mg/dL
Potassium: 4.7 mmol/L (ref 3.4–5.3)
Sodium: 136 mmol/L — AB (ref 137–147)

## 2015-01-09 DIAGNOSIS — S82851D Displaced trimalleolar fracture of right lower leg, subsequent encounter for closed fracture with routine healing: Secondary | ICD-10-CM | POA: Diagnosis not present

## 2015-01-09 DIAGNOSIS — R278 Other lack of coordination: Secondary | ICD-10-CM | POA: Diagnosis not present

## 2015-01-09 DIAGNOSIS — S82851A Displaced trimalleolar fracture of right lower leg, initial encounter for closed fracture: Secondary | ICD-10-CM | POA: Diagnosis not present

## 2015-01-09 DIAGNOSIS — M6281 Muscle weakness (generalized): Secondary | ICD-10-CM | POA: Diagnosis not present

## 2015-01-09 DIAGNOSIS — M79601 Pain in right arm: Secondary | ICD-10-CM | POA: Diagnosis not present

## 2015-01-09 DIAGNOSIS — Z5189 Encounter for other specified aftercare: Secondary | ICD-10-CM | POA: Diagnosis not present

## 2015-01-09 DIAGNOSIS — M25552 Pain in left hip: Secondary | ICD-10-CM | POA: Diagnosis not present

## 2015-01-12 DIAGNOSIS — M25552 Pain in left hip: Secondary | ICD-10-CM | POA: Diagnosis not present

## 2015-01-12 DIAGNOSIS — M6281 Muscle weakness (generalized): Secondary | ICD-10-CM | POA: Diagnosis not present

## 2015-01-12 DIAGNOSIS — R278 Other lack of coordination: Secondary | ICD-10-CM | POA: Diagnosis not present

## 2015-01-12 DIAGNOSIS — S82851A Displaced trimalleolar fracture of right lower leg, initial encounter for closed fracture: Secondary | ICD-10-CM | POA: Diagnosis not present

## 2015-01-12 DIAGNOSIS — Z5189 Encounter for other specified aftercare: Secondary | ICD-10-CM | POA: Diagnosis not present

## 2015-01-12 DIAGNOSIS — M79601 Pain in right arm: Secondary | ICD-10-CM | POA: Diagnosis not present

## 2015-01-14 DIAGNOSIS — M79601 Pain in right arm: Secondary | ICD-10-CM | POA: Diagnosis not present

## 2015-01-14 DIAGNOSIS — R278 Other lack of coordination: Secondary | ICD-10-CM | POA: Diagnosis not present

## 2015-01-14 DIAGNOSIS — M25552 Pain in left hip: Secondary | ICD-10-CM | POA: Diagnosis not present

## 2015-01-14 DIAGNOSIS — Z5189 Encounter for other specified aftercare: Secondary | ICD-10-CM | POA: Diagnosis not present

## 2015-01-14 DIAGNOSIS — S82851A Displaced trimalleolar fracture of right lower leg, initial encounter for closed fracture: Secondary | ICD-10-CM | POA: Diagnosis not present

## 2015-01-14 DIAGNOSIS — M6281 Muscle weakness (generalized): Secondary | ICD-10-CM | POA: Diagnosis not present

## 2015-01-16 DIAGNOSIS — M6281 Muscle weakness (generalized): Secondary | ICD-10-CM | POA: Diagnosis not present

## 2015-01-16 DIAGNOSIS — M79601 Pain in right arm: Secondary | ICD-10-CM | POA: Diagnosis not present

## 2015-01-16 DIAGNOSIS — M25552 Pain in left hip: Secondary | ICD-10-CM | POA: Diagnosis not present

## 2015-01-16 DIAGNOSIS — R278 Other lack of coordination: Secondary | ICD-10-CM | POA: Diagnosis not present

## 2015-01-16 DIAGNOSIS — S82851A Displaced trimalleolar fracture of right lower leg, initial encounter for closed fracture: Secondary | ICD-10-CM | POA: Diagnosis not present

## 2015-01-16 DIAGNOSIS — Z5189 Encounter for other specified aftercare: Secondary | ICD-10-CM | POA: Diagnosis not present

## 2015-01-17 DIAGNOSIS — R197 Diarrhea, unspecified: Secondary | ICD-10-CM | POA: Diagnosis not present

## 2015-01-18 DIAGNOSIS — A499 Bacterial infection, unspecified: Secondary | ICD-10-CM | POA: Diagnosis not present

## 2015-01-18 DIAGNOSIS — R197 Diarrhea, unspecified: Secondary | ICD-10-CM | POA: Diagnosis not present

## 2015-01-18 LAB — BASIC METABOLIC PANEL
BUN: 12 mg/dL (ref 4–21)
Creatinine: 0.5 mg/dL (ref 0.5–1.1)
Glucose: 99 mg/dL
POTASSIUM: 3.9 mmol/L (ref 3.4–5.3)
SODIUM: 135 mmol/L — AB (ref 137–147)

## 2015-01-18 LAB — CBC AND DIFFERENTIAL
HEMATOCRIT: 35 % — AB (ref 36–46)
Hemoglobin: 11.9 g/dL — AB (ref 12.0–16.0)
Platelets: 307 10*3/uL (ref 150–399)
WBC: 6.4 10*3/mL

## 2015-01-19 ENCOUNTER — Encounter: Payer: Self-pay | Admitting: Adult Health

## 2015-01-19 ENCOUNTER — Non-Acute Institutional Stay (SKILLED_NURSING_FACILITY): Payer: Medicare Other | Admitting: Adult Health

## 2015-01-19 DIAGNOSIS — I482 Chronic atrial fibrillation, unspecified: Secondary | ICD-10-CM

## 2015-01-19 DIAGNOSIS — R55 Syncope and collapse: Secondary | ICD-10-CM

## 2015-01-19 DIAGNOSIS — J449 Chronic obstructive pulmonary disease, unspecified: Secondary | ICD-10-CM | POA: Diagnosis not present

## 2015-01-19 DIAGNOSIS — A0472 Enterocolitis due to Clostridium difficile, not specified as recurrent: Secondary | ICD-10-CM

## 2015-01-19 DIAGNOSIS — A047 Enterocolitis due to Clostridium difficile: Secondary | ICD-10-CM
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
## 2015-01-23 ENCOUNTER — Telehealth: Payer: Self-pay | Admitting: Internal Medicine

## 2015-01-23 NOTE — Telephone Encounter (Signed)
Returned called to patient who states that she has been at [facility] for rehab x 12/07/14. She says that she has Cdiff and is not getting any better and wants advisement from PCP. When asked pt was unable to provide any information regarding who is treating and and with what time of medications. I advise if she is on an antibiotic, the name of it and for how long she has been taking it. She does mention and abx but unsure as to how long. Pt told that additional information is needed in order to advise accordingly. I called the facility again, spoke with [place] the Rn who would not divulge any information but states that she can fax over their last progress note. Provided clinical fax number of [phone], no fax received as of yet.
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
## 2015-01-23 NOTE — Telephone Encounter (Signed)
Brittney from [facility] called wanting to give you some information. Please call back

## 2015-01-28 ENCOUNTER — Telehealth: Payer: Self-pay | Admitting: Internal Medicine

## 2015-01-28 NOTE — Telephone Encounter (Signed)
Pt request to transfer from Dr. Ronnald Ramp to [facility]. Please advise.

## 2015-01-28 NOTE — Telephone Encounter (Signed)
Fine with me

## 2015-02-01 NOTE — Telephone Encounter (Signed)
yes

## 2015-02-09 ENCOUNTER — Non-Acute Institutional Stay (SKILLED_NURSING_FACILITY): Payer: Medicare Other | Admitting: Adult Health

## 2015-02-09 ENCOUNTER — Encounter: Payer: Self-pay | Admitting: Adult Health

## 2015-02-09 DIAGNOSIS — I482 Chronic atrial fibrillation, unspecified: Secondary | ICD-10-CM

## 2015-02-09 DIAGNOSIS — J4520 Mild intermittent asthma, uncomplicated: Secondary | ICD-10-CM | POA: Diagnosis not present

## 2015-02-09 DIAGNOSIS — E559 Vitamin D deficiency, unspecified: Secondary | ICD-10-CM

## 2015-02-09 DIAGNOSIS — A047 Enterocolitis due to Clostridium difficile: Secondary | ICD-10-CM | POA: Diagnosis not present

## 2015-02-09 DIAGNOSIS — S82891A Other fracture of right lower leg, initial encounter for closed fracture: Secondary | ICD-10-CM | POA: Diagnosis not present

## 2015-02-09 DIAGNOSIS — I10 Essential (primary) hypertension: Secondary | ICD-10-CM

## 2015-02-09 DIAGNOSIS — A0472 Enterocolitis due to Clostridium difficile, not specified as recurrent: Secondary | ICD-10-CM
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
## 2015-02-11 ENCOUNTER — Non-Acute Institutional Stay (SKILLED_NURSING_FACILITY): Payer: Medicare Other | Admitting: Internal Medicine

## 2015-02-11 DIAGNOSIS — G44211 Episodic tension-type headache, intractable: Secondary | ICD-10-CM

## 2015-02-11 DIAGNOSIS — F418 Other specified anxiety disorders: Secondary | ICD-10-CM

## 2015-02-11 DIAGNOSIS — K589 Irritable bowel syndrome without diarrhea: Secondary | ICD-10-CM | POA: Diagnosis not present
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
## 2015-02-12 ENCOUNTER — Encounter: Payer: Self-pay | Admitting: Adult Health

## 2015-02-12 ENCOUNTER — Non-Acute Institutional Stay (SKILLED_NURSING_FACILITY): Payer: Medicare Other | Admitting: Adult Health

## 2015-02-12 DIAGNOSIS — R410 Disorientation, unspecified: Secondary | ICD-10-CM

## 2015-02-12 DIAGNOSIS — R55 Syncope and collapse: Secondary | ICD-10-CM | POA: Diagnosis not present

## 2015-02-12 DIAGNOSIS — G44219 Episodic tension-type headache, not intractable: Secondary | ICD-10-CM
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
## 2015-02-13 LAB — BASIC METABOLIC PANEL
BUN: 18 mg/dL (ref 4–21)
Creatinine: 0.6 mg/dL (ref 0.5–1.1)
Glucose: 94 mg/dL
Potassium: 3.5 mmol/L (ref 3.4–5.3)
SODIUM: 133 mmol/L — AB (ref 137–147)

## 2015-02-13 LAB — CBC AND DIFFERENTIAL
HEMATOCRIT: 33 % — AB (ref 36–46)
Hemoglobin: 11.3 g/dL — AB (ref 12.0–16.0)
PLATELETS: 267 10*3/uL (ref 150–399)
WBC: 11.5 10^3/mL

## 2015-02-17 ENCOUNTER — Encounter: Payer: Self-pay | Admitting: Internal Medicine

## 2015-02-17 ENCOUNTER — Non-Acute Institutional Stay (SKILLED_NURSING_FACILITY): Payer: Medicare Other | Admitting: Internal Medicine

## 2015-02-17 DIAGNOSIS — K589 Irritable bowel syndrome without diarrhea: Secondary | ICD-10-CM

## 2015-02-17 DIAGNOSIS — N3000 Acute cystitis without hematuria: Secondary | ICD-10-CM

## 2015-02-17 DIAGNOSIS — R410 Disorientation, unspecified: Secondary | ICD-10-CM | POA: Diagnosis not present

## 2015-02-17 DIAGNOSIS — I482 Chronic atrial fibrillation, unspecified: Secondary | ICD-10-CM

## 2015-02-17 DIAGNOSIS — S82891A Other fracture of right lower leg, initial encounter for closed fracture: Secondary | ICD-10-CM | POA: Diagnosis not present

## 2015-02-17 DIAGNOSIS — Z8619 Personal history of other infectious and parasitic diseases: Secondary | ICD-10-CM

## 2015-02-17 MED ORDER — RIVAROXABAN 20 MG PO TABS: 20.0000 mg | ORAL_TABLET | Freq: Every day | ORAL | Status: DC

## 2015-02-17 MED ORDER — ELUXADOLINE 100 MG PO TABS: 100.0000 mg | ORAL_TABLET | Freq: Two times a day (BID) | ORAL | Status: DC

## 2015-02-17 MED ORDER — SACCHAROMYCES BOULARDII 250 MG PO CAPS: 250.0000 mg | ORAL_CAPSULE | Freq: Two times a day (BID) | ORAL | Status: DC
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
## 2015-02-26 ENCOUNTER — Non-Acute Institutional Stay (SKILLED_NURSING_FACILITY): Payer: Medicare Other | Admitting: Adult Health

## 2015-02-26 ENCOUNTER — Encounter: Payer: Self-pay | Admitting: Adult Health

## 2015-02-26 DIAGNOSIS — R05 Cough: Secondary | ICD-10-CM

## 2015-02-26 DIAGNOSIS — K589 Irritable bowel syndrome without diarrhea: Secondary | ICD-10-CM

## 2015-02-26 DIAGNOSIS — R059 Cough, unspecified: Secondary | ICD-10-CM
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
## 2015-02-27 ENCOUNTER — Other Ambulatory Visit: Payer: Self-pay | Admitting: Internal Medicine

## 2015-02-27 ENCOUNTER — Encounter: Payer: Self-pay | Admitting: Adult Health

## 2015-02-27 ENCOUNTER — Non-Acute Institutional Stay (SKILLED_NURSING_FACILITY): Payer: Medicare Other | Admitting: Adult Health

## 2015-02-27 ENCOUNTER — Ambulatory Visit
Admission: RE | Admit: 2015-02-27 | Discharge: 2015-02-27 | Disposition: A | Discharge status: Home / Self Care | Payer: Medicare Other | Source: Ambulatory Visit | Attending: Internal Medicine | Admitting: Internal Medicine

## 2015-02-27 DIAGNOSIS — R21 Rash and other nonspecific skin eruption: Secondary | ICD-10-CM

## 2015-02-27 DIAGNOSIS — R05 Cough: Secondary | ICD-10-CM

## 2015-02-27 DIAGNOSIS — J189 Pneumonia, unspecified organism: Secondary | ICD-10-CM

## 2015-02-27 DIAGNOSIS — R059 Cough, unspecified: Secondary | ICD-10-CM
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
## 2015-03-06 ENCOUNTER — Inpatient Hospital Stay (HOSPITAL_COMMUNITY)
Admission: EM | Admit: 2015-03-06 | Discharge: 2015-03-09 | DRG: 872 | Disposition: A | Discharge status: Skilled Nursing Facility | Payer: Medicare Other | Attending: Internal Medicine | Admitting: Internal Medicine

## 2015-03-06 ENCOUNTER — Emergency Department (HOSPITAL_COMMUNITY): Payer: Medicare Other

## 2015-03-06 ENCOUNTER — Non-Acute Institutional Stay (SKILLED_NURSING_FACILITY): Payer: Medicare Other | Admitting: Adult Health

## 2015-03-06 ENCOUNTER — Encounter (HOSPITAL_COMMUNITY): Payer: Self-pay

## 2015-03-06 DIAGNOSIS — D638 Anemia in other chronic diseases classified elsewhere: Secondary | ICD-10-CM | POA: Diagnosis present

## 2015-03-06 DIAGNOSIS — R112 Nausea with vomiting, unspecified: Secondary | ICD-10-CM

## 2015-03-06 DIAGNOSIS — R197 Diarrhea, unspecified: Secondary | ICD-10-CM

## 2015-03-06 DIAGNOSIS — X58XXXD Exposure to other specified factors, subsequent encounter: Secondary | ICD-10-CM | POA: Diagnosis present

## 2015-03-06 DIAGNOSIS — J45909 Unspecified asthma, uncomplicated: Secondary | ICD-10-CM | POA: Diagnosis present

## 2015-03-06 DIAGNOSIS — S82851D Displaced trimalleolar fracture of right lower leg, subsequent encounter for closed fracture with routine healing: Secondary | ICD-10-CM

## 2015-03-06 DIAGNOSIS — A419 Sepsis, unspecified organism: Principal | ICD-10-CM

## 2015-03-06 DIAGNOSIS — J189 Pneumonia, unspecified organism: Secondary | ICD-10-CM | POA: Diagnosis not present

## 2015-03-06 DIAGNOSIS — K589 Irritable bowel syndrome without diarrhea: Secondary | ICD-10-CM | POA: Diagnosis present

## 2015-03-06 DIAGNOSIS — E876 Hypokalemia: Secondary | ICD-10-CM | POA: Diagnosis present

## 2015-03-06 DIAGNOSIS — A047 Enterocolitis due to Clostridium difficile: Secondary | ICD-10-CM | POA: Diagnosis present

## 2015-03-06 DIAGNOSIS — Z87891 Personal history of nicotine dependence: Secondary | ICD-10-CM

## 2015-03-06 DIAGNOSIS — R509 Fever, unspecified: Secondary | ICD-10-CM

## 2015-03-06 DIAGNOSIS — I480 Paroxysmal atrial fibrillation: Secondary | ICD-10-CM

## 2015-03-06 DIAGNOSIS — F419 Anxiety disorder, unspecified: Secondary | ICD-10-CM | POA: Diagnosis present

## 2015-03-06 DIAGNOSIS — K2970 Gastritis, unspecified, without bleeding: Secondary | ICD-10-CM | POA: Diagnosis present

## 2015-03-06 DIAGNOSIS — R110 Nausea: Secondary | ICD-10-CM

## 2015-03-06 DIAGNOSIS — R062 Wheezing: Secondary | ICD-10-CM

## 2015-03-06 DIAGNOSIS — E871 Hypo-osmolality and hyponatremia: Secondary | ICD-10-CM | POA: Diagnosis present

## 2015-03-06 DIAGNOSIS — G4700 Insomnia, unspecified: Secondary | ICD-10-CM | POA: Diagnosis present

## 2015-03-06 DIAGNOSIS — I4891 Unspecified atrial fibrillation: Secondary | ICD-10-CM | POA: Diagnosis present

## 2015-03-06 DIAGNOSIS — Z96651 Presence of right artificial knee joint: Secondary | ICD-10-CM | POA: Diagnosis present

## 2015-03-06 DIAGNOSIS — Z79899 Other long term (current) drug therapy: Secondary | ICD-10-CM | POA: Diagnosis not present

## 2015-03-06 DIAGNOSIS — I10 Essential (primary) hypertension: Secondary | ICD-10-CM | POA: Diagnosis present

## 2015-03-06 DIAGNOSIS — J449 Chronic obstructive pulmonary disease, unspecified: Secondary | ICD-10-CM | POA: Diagnosis present

## 2015-03-06 DIAGNOSIS — S82899A Other fracture of unspecified lower leg, initial encounter for closed fracture: Secondary | ICD-10-CM | POA: Diagnosis present

## 2015-03-06 DIAGNOSIS — A0472 Enterocolitis due to Clostridium difficile, not specified as recurrent: Secondary | ICD-10-CM | POA: Diagnosis present

## 2015-03-06 DIAGNOSIS — Z66 Do not resuscitate: Secondary | ICD-10-CM | POA: Diagnosis present

## 2015-03-06 DIAGNOSIS — L309 Dermatitis, unspecified: Secondary | ICD-10-CM | POA: Diagnosis present

## 2015-03-06 DIAGNOSIS — S82891A Other fracture of right lower leg, initial encounter for closed fracture: Secondary | ICD-10-CM | POA: Diagnosis not present

## 2015-03-06 DIAGNOSIS — R9431 Abnormal electrocardiogram [ECG] [EKG]: Secondary | ICD-10-CM | POA: Diagnosis present

## 2015-03-06 DIAGNOSIS — S82853A Displaced trimalleolar fracture of unspecified lower leg, initial encounter for closed fracture: Secondary | ICD-10-CM | POA: Diagnosis present

## 2015-03-06 DIAGNOSIS — I4581 Long QT syndrome: Secondary | ICD-10-CM | POA: Diagnosis not present

## 2015-03-06 DIAGNOSIS — K920 Hematemesis: Secondary | ICD-10-CM

## 2015-03-06 HISTORY — DX: Unspecified atrial fibrillation: I48.91

## 2015-03-06 LAB — LACTIC ACID, PLASMA: Lactic Acid, Venous: 1 mmol/L (ref 0.5–2.0)

## 2015-03-06 LAB — MRSA PCR SCREENING: MRSA by PCR: NEGATIVE

## 2015-03-06 LAB — COMPREHENSIVE METABOLIC PANEL
ALBUMIN: 2.6 g/dL — AB (ref 3.5–5.0)
ALT: 25 U/L (ref 14–54)
AST: 23 U/L (ref 15–41)
Alkaline Phosphatase: 88 U/L (ref 38–126)
Anion gap: 11 (ref 5–15)
BUN: 15 mg/dL (ref 6–20)
CALCIUM: 8.2 mg/dL — AB (ref 8.9–10.3)
CO2: 20 mmol/L — ABNORMAL LOW (ref 22–32)
Chloride: 103 mmol/L (ref 101–111)
Creatinine, Ser: 0.47 mg/dL (ref 0.44–1.00)
GFR calc Af Amer: >60 mL/min (ref >60)
GFR calc non Af Amer: >60 mL/min (ref >60)
Glucose, Bld: 127 mg/dL — ABNORMAL HIGH (ref 65–99)
Potassium: 3.3 mmol/L — ABNORMAL LOW (ref 3.5–5.1)
SODIUM: 134 mmol/L — AB (ref 135–145)
Total Bilirubin: 1 mg/dL (ref 0.3–1.2)
Total Protein: 5.5 g/dL — ABNORMAL LOW (ref 6.5–8.1)

## 2015-03-06 LAB — URINALYSIS, ROUTINE W REFLEX MICROSCOPIC
Bilirubin Urine: NEGATIVE
Glucose, UA: NEGATIVE mg/dL
Hgb urine dipstick: NEGATIVE
Ketones, ur: NEGATIVE mg/dL
LEUKOCYTES UA: NEGATIVE
NITRITE: NEGATIVE
PH: 5.5 (ref 5.0–8.0)
Protein, ur: NEGATIVE mg/dL
Specific Gravity, Urine: 1.015 (ref 1.005–1.030)
Urobilinogen, UA: 0.2 mg/dL (ref 0.0–1.0)

## 2015-03-06 LAB — CBC WITH DIFFERENTIAL/PLATELET
Basophils Absolute: 0 10*3/uL (ref 0.0–0.1)
Basophils Relative: 0 % (ref 0–1)
Eosinophils Absolute: 0 10*3/uL (ref 0.0–0.7)
Eosinophils Relative: 0 % (ref 0–5)
HCT: 34.4 % — ABNORMAL LOW (ref 36.0–46.0)
HEMOGLOBIN: 11.5 g/dL — AB (ref 12.0–15.0)
LYMPHS ABS: 0.7 10*3/uL (ref 0.7–4.0)
LYMPHS PCT: 5 % — AB (ref 12–46)
MCH: 28.7 pg (ref 26.0–34.0)
MCHC: 33.4 g/dL (ref 30.0–36.0)
MCV: 85.8 fL (ref 78.0–100.0)
Monocytes Absolute: 0.3 10*3/uL (ref 0.1–1.0)
Monocytes Relative: 2 % — ABNORMAL LOW (ref 3–12)
NEUTROS ABS: 13 10*3/uL — AB (ref 1.7–7.7)
NEUTROS PCT: 93 % — AB (ref 43–77)
Platelets: 232 10*3/uL (ref 150–400)
RBC: 4.01 MIL/uL (ref 3.87–5.11)
RDW: 14.2 % (ref 11.5–15.5)
WBC: 13.9 10*3/uL — ABNORMAL HIGH (ref 4.0–10.5)

## 2015-03-06 LAB — TYPE AND SCREEN
ABO/RH(D): O POS
Antibody Screen: NEGATIVE

## 2015-03-06 LAB — POC OCCULT BLOOD, ED: Fecal Occult Bld: POSITIVE — AB

## 2015-03-06 LAB — LIPASE, BLOOD: Lipase: <10 U/L — ABNORMAL LOW (ref 22–51)

## 2015-03-06 LAB — CLOSTRIDIUM DIFFICILE BY PCR: Toxigenic C. Difficile by PCR: POSITIVE — AB

## 2015-03-06 MED ORDER — SODIUM CHLORIDE 0.9 % IV BOLUS (SEPSIS)
1000.0000 mL | Freq: Once | INTRAVENOUS | Status: AC
  Administered 2015-03-06: 1000 mL via INTRAVENOUS

## 2015-03-06 MED ORDER — METRONIDAZOLE IN NACL 5-0.79 MG/ML-% IV SOLN
500.0000 mg | Freq: Three times a day (TID) | INTRAVENOUS | Status: DC
  Administered 2015-03-06 – 2015-03-08 (×5): 500 mg via INTRAVENOUS
  Filled 2015-03-06 (×6): qty 100

## 2015-03-06 MED ORDER — VITAMINS A & D EX OINT
TOPICAL_OINTMENT | CUTANEOUS | Status: AC
  Administered 2015-03-06: 5
  Filled 2015-03-06: qty 5

## 2015-03-06 MED ORDER — OXYCODONE HCL 5 MG PO TABS
5.0000 mg | ORAL_TABLET | Freq: Four times a day (QID) | ORAL | Status: DC | PRN
  Administered 2015-03-07: 5 mg via ORAL
  Filled 2015-03-06 (×2): qty 1

## 2015-03-06 MED ORDER — DILTIAZEM HCL ER COATED BEADS 120 MG PO CP24
120.0000 mg | ORAL_CAPSULE | Freq: Every day | ORAL | Status: DC
  Administered 2015-03-06 – 2015-03-09 (×4): 120 mg via ORAL
  Filled 2015-03-06 (×4): qty 1

## 2015-03-06 MED ORDER — NYSTATIN 100000 UNIT/GM EX CREA
TOPICAL_CREAM | Freq: Two times a day (BID) | CUTANEOUS | Status: DC
  Administered 2015-03-06 – 2015-03-09 (×6): via TOPICAL
  Filled 2015-03-06: qty 15

## 2015-03-06 MED ORDER — PANTOPRAZOLE SODIUM 40 MG IV SOLR: 40.0000 mg | Freq: Two times a day (BID) | INTRAVENOUS | Status: DC

## 2015-03-06 MED ORDER — ACETAMINOPHEN 325 MG PO TABS: 650.0000 mg | ORAL_TABLET | Freq: Four times a day (QID) | ORAL | Status: DC | PRN

## 2015-03-06 MED ORDER — DM-GUAIFENESIN ER 30-600 MG PO TB12
1.0000 | ORAL_TABLET | Freq: Two times a day (BID) | ORAL | Status: DC
  Administered 2015-03-06 – 2015-03-09 (×6): 1 via ORAL
  Filled 2015-03-06 (×7): qty 1

## 2015-03-06 MED ORDER — ELUXADOLINE 100 MG PO TABS: 100.0000 mg | ORAL_TABLET | Freq: Two times a day (BID) | ORAL | Status: DC

## 2015-03-06 MED ORDER — SODIUM CHLORIDE 0.9 % IV SOLN
80.0000 mg | Freq: Once | INTRAVENOUS | Status: AC
  Administered 2015-03-06: 80 mg via INTRAVENOUS
  Filled 2015-03-06: qty 80

## 2015-03-06 MED ORDER — ZOLPIDEM TARTRATE 5 MG PO TABS
5.0000 mg | ORAL_TABLET | Freq: Every evening | ORAL | Status: DC | PRN
  Administered 2015-03-06 – 2015-03-08 (×3): 5 mg via ORAL
  Filled 2015-03-06 (×3): qty 1

## 2015-03-06 MED ORDER — KCL IN DEXTROSE-NACL 20-5-0.45 MEQ/L-%-% IV SOLN
INTRAVENOUS | Status: DC
  Administered 2015-03-06 – 2015-03-07 (×2): via INTRAVENOUS
  Filled 2015-03-06 (×2): qty 1000

## 2015-03-06 MED ORDER — ACETAMINOPHEN 650 MG RE SUPP
650.0000 mg | Freq: Once | RECTAL | Status: AC
  Administered 2015-03-06: 650 mg via RECTAL
  Filled 2015-03-06: qty 1

## 2015-03-06 MED ORDER — ALPRAZOLAM 0.25 MG PO TABS: 0.2500 mg | ORAL_TABLET | Freq: Two times a day (BID) | ORAL | Status: DC | PRN

## 2015-03-06 MED ORDER — SODIUM CHLORIDE 0.9 % IV SOLN
8.0000 mg/h | INTRAVENOUS | Status: DC
  Administered 2015-03-06: 8 mg/h via INTRAVENOUS
  Filled 2015-03-06 (×2): qty 80

## 2015-03-06 MED ORDER — LATANOPROST 0.005 % OP SOLN
1.0000 [drp] | Freq: Every day | OPHTHALMIC | Status: DC
  Administered 2015-03-06 – 2015-03-08 (×3): 1 [drp] via OPHTHALMIC
  Filled 2015-03-06: qty 2.5

## 2015-03-06 MED ORDER — SODIUM CHLORIDE 0.9 % IV BOLUS (SEPSIS): 1000.0000 mL | INTRAVENOUS | Status: AC

## 2015-03-06 MED ORDER — SACCHAROMYCES BOULARDII 250 MG PO CAPS
250.0000 mg | ORAL_CAPSULE | Freq: Two times a day (BID) | ORAL | Status: DC
  Administered 2015-03-06 – 2015-03-09 (×6): 250 mg via ORAL
  Filled 2015-03-06 (×7): qty 1

## 2015-03-06 MED ORDER — VANCOMYCIN 50 MG/ML ORAL SOLUTION
500.0000 mg | Freq: Four times a day (QID) | ORAL | Status: DC
  Administered 2015-03-06 – 2015-03-09 (×12): 500 mg via ORAL
  Filled 2015-03-06 (×15): qty 10

## 2015-03-06 MED ORDER — PROMETHAZINE HCL 25 MG/ML IJ SOLN
12.5000 mg | Freq: Four times a day (QID) | INTRAMUSCULAR | Status: DC | PRN
  Administered 2015-03-07: 12.5 mg via INTRAVENOUS
  Filled 2015-03-06: qty 1

## 2015-03-06 MED ORDER — FLUTICASONE FUROATE-VILANTEROL 200-25 MCG/INH IN AEPB: 1.0000 | INHALATION_SPRAY | Freq: Every day | RESPIRATORY_TRACT | Status: DC

## 2015-03-06 MED ORDER — TIOTROPIUM BROMIDE MONOHYDRATE 18 MCG IN CAPS
18.0000 ug | ORAL_CAPSULE | Freq: Every day | RESPIRATORY_TRACT | Status: DC
  Administered 2015-03-07 – 2015-03-09 (×3): 18 ug via RESPIRATORY_TRACT
  Filled 2015-03-06: qty 5

## 2015-03-06 MED ORDER — TRAMADOL HCL 50 MG PO TABS
50.0000 mg | ORAL_TABLET | Freq: Four times a day (QID) | ORAL | Status: DC | PRN
Start: 2015-03-06 — End: 2015-03-09

## 2015-03-06 MED ORDER — CLOTRIMAZOLE 1 % EX CREA
TOPICAL_CREAM | Freq: Two times a day (BID) | CUTANEOUS | Status: DC
  Administered 2015-03-06 – 2015-03-09 (×6): via TOPICAL
  Filled 2015-03-06: qty 15

## 2015-03-06 MED ORDER — LEVALBUTEROL HCL 0.63 MG/3ML IN NEBU: 0.6300 mg | INHALATION_SOLUTION | RESPIRATORY_TRACT | Status: DC | PRN

## 2015-03-06 NOTE — ED Notes (Signed)
Attempted to call report. Receiving  Nurse with another patient and will return call.

## 2015-03-06 NOTE — ED Notes (Signed)
Bed: MG86 Expected date:  Expected time:  Means of arrival:  Comments: EMS-possible C-Diff

## 2015-03-06 NOTE — ED Notes (Signed)
Waiting for daughter to return to the room. There is an open purse, cell phone, papers and folders, calculator that belongs to the daughter.

## 2015-03-06 NOTE — ED Notes (Signed)
Per EMS- Patient is a patient at [facility]. Patient has been vomiting blood and stool was positive for blood. Patient is currently taking Xarelto. Stool is dark red blood. Patient also has a history of c.diff.

## 2015-03-06 NOTE — Progress Notes (Signed)
Utilization Review completed.  Andriea Hasegawa RN CM  

## 2015-03-06 NOTE — H&P (Signed)
[facility] History and Physical  Lori [name] ZOX:[id number] DOB: [birth year] DOA: 03/06/2015  Referring physician:  Jola Schmidt PCP:  Olga Millers, MD   Chief Complaint:  Vomiting, diarrhea, fever, weakness  HPI:  The patient is a 81 y.o. year-old female with history of COPD/asthma, essential hypertension, anxiety, A. fib on Xarelto, right ankle fracture in 11/2014 and post-operative course complicated by C. Diff diarrhea twice in 12/2014, UTI the following month, and then the last week she has been treated for 7 days with Primaxin for pneumonia.  She has been undergoing rehab at [facility] along with her husband who is undergoing treatment for advanced head and neck cancer.  The patient has IBS and has frequent diarrhea but has had formed and regular stools recently while taking Florastor which has been recommended as lifelong therapy for her.  According to the patient and to her RN at [facility] whom I called for more information, she was feeling better with regards to her cough and fever the last few days on Primaxin.  Two days ago, however, she became fatigued and started losing her appetite.  The night prior to admission, she was unable to eat dinner due to nausea and a few hours later, she had a large volume watery brown stool and vomited simultaneously while sitting on the toilet. She syncopized (which is common for her when she has diarrhea) and was placed on the floor and then in her bed to recover.   This morning, she did not look well and was pale and weak.  She had a fever to 101F and had recurrent vomiting of dark brown to black material that tested positive for blood.  She also had another loose watery BM without gross blood.  She has had 8/10 diffuse abdominal pain without radiation that has gotten better in the ER.  Denies other complaints but she is upset that she missed her appointment with Dr. Doran Durand who was potentially going to allow her to start bearing weight on her right  foot today.    In the ER, VS were notable for T102.65F and mild tachycardia to 100s (a-fib), BP stable and lactic acid wnl.  She had a mild leukocytosis to 13.9, sodium 134, potassium 3.3, CO2 20.  Occult stool positive for blood but brown stool in vault per ER MD.  CXR demonstrated stable interstitial densities and minimal bilateral pleural effusions suggestive of pneumonia or edema.  She is being admitted for sepsis due to probable C. Diff diarrhea.    Review of Systems:  General:  Positive fevers, chills, denies weight loss or gain.  HOH HEENT:  Denies changes to hearing and vision, rhinorrhea, sinus congestion, positive sore throat CV:  Denies chest pain and palpitations, lower extremity edema.  PULM:  Denies SOB, wheezing, positive cough which has improved on antibiotics GI:  Per HPI   GU:  Denies dysuria, frequency, urgency ENDO:  Denies polyuria, polydipsia.   HEME:  Per HPI LYMPH:  Denies lymphadenopathy.   MSK:  Chronic arthralgias, myalgias.  Persistent boot on the RLE.   DERM:  Denies skin rash or ulcer.   NEURO:  Denies focal numbness, weakness, slurred speech, confusion, facial droop.  PSYCH:  Denies anxiety and depression.    Past Medical History  Diagnosis Date  . COPD (chronic obstructive pulmonary disease)   . Asthma   . ALLERGIC RHINITIS   . Hypertension   . Pleural effusion, left   . Colles' fracture of left radius   . Anxiety   .  Back pain of thoracolumbar region     right  . Pruritus   . Diastolic dysfunction   . Pulmonary nodule   . IBS (irritable bowel syndrome)   . Bronchiectasis   . Insomnia   . History of pneumonia   . Diverticulosis   . Hx of colonic polyp   . Multiple rib fractures 10/2008    bilateral  . Hx of cardiovascular stress test     a. ETT-MV 1/14: Exercised 4:16, no ECG changes, poor ex tol, ant defect c/w soft tissue atten, no ischemia, EF 75%  . Atrial fibrillation    Past Surgical History  Procedure Laterality Date  . Tonsillectomy     . Appendectomy    . Cholecystectomy    . Polypectomy    . Bladder suspension  2005    A-P  . Rotator cuff repair Right 2006  . Orif wrist fracture Left 2010      Dr. Burney Gauze.  . Total knee arthroplasty Right 08/2010  . Tonsillectomy    . Colonoscopy  04/06/2012    Procedure: COLONOSCOPY;  Surgeon: Jerene Bears, MD;  Location: [facility];  Service: Gastroenterology;  Laterality: N/A;  . External fixation leg Right 12/12/2014    Procedure: ADJUSTMENT OF EXTERNAL FIXATION  RIGHT ANKLE;  Surgeon: Rod Can, MD;  Location: [facility];  Service: Orthopedics;  Laterality: Right;  . Ankle closed reduction Right 12/11/2014    Procedure: CLOSED REDUCTION ANKLE;  Surgeon: Rod Can, MD;  Location: [place];  Service: Orthopedics;  Laterality: Right;  . External fixation leg Right 12/11/2014    Procedure: POSSIBLE EXTERNAL FIXATION RIGHT ANKLE;  Surgeon: Rod Can, MD;  Location: [place];  Service: Orthopedics;  Laterality: Right;  . Hardware removal Right 12/25/2014    Procedure: RIGHT ANKLE REMOVE OF DEEP IMPLANT ANE EXTERNAL FIXATOR ;  Surgeon: [name], MD;  Location: [place];  Service: Orthopedics;  Laterality: Right;  . Orif ankle fracture Right 12/25/2014    Procedure: OPEN REDUCTION INTERNAL FIXATION (ORIF)TRIMALLEOLAR  ANKLE FRACTURE;  Surgeon: [name], MD;  Location: [place];  Service: Orthopedics;  Laterality: Right;   Social History:  reports that she quit smoking about 66 years ago. Her smoking use included Cigarettes. She has a 3 pack-year smoking history. She has never used smokeless tobacco. She reports that she does not drink alcohol or use illicit drugs. [facility], mostly wheelchair dependent and can use left leg for transfers  Allergies  Allergen Reactions  . Albuterol Sulfate Palpitations  . Levaquin [Levofloxacin In D5w] Other (See Comments)    QT prolongation. Should avoid all flouroquinolones.   . Doxycycline Diarrhea  .  Lactose Intolerance (Gi) Other (See Comments)  . Amoxicillin Other (See Comments)    REACTION: unspecified  . Codeine Other (See Comments)    Can take Hydrocodone  . Penicillins Hives, Itching and Rash  . Sulfa Antibiotics Nausea Only  . Sulfonamide Derivatives Nausea Only    Family History  Problem Relation Age of Onset  . Heart disease Mother   . Colon cancer Neg Hx   . Bipolar disorder Daughter   . Rheumatic fever Mother   . Pneumonia Father      Prior to Admission medications   Medication Sig Start Date End Date Taking? Authorizing Provider  acetaminophen (TYLENOL) 325 MG tablet Take 2 tablets (650 mg total) by mouth every 6 (six) hours as needed for mild pain (or Fever >/= 101). 12/13/14  Yes Bonnielee Haff, MD  ALPRAZolam (XANAX) 0.25 MG tablet Take 1 tablet (0.25 mg total) by mouth 2 (two) times daily as needed for anxiety. Dr Aundra Dubin will not be able to provide additional refills 12/13/14  Yes Bonnielee Haff, MD  bimatoprost (LUMIGAN) 0.01 % SOLN Place 1 drop into both eyes daily. 08/06/14  Yes Janith Lima, MD  Cholecalciferol (VITAMIN D-3) 1000 UNITS CAPS Take 2,000 Units by mouth daily.   Yes Historical Provider, MD  clotrimazole-betamethasone (LOTRISONE) cream Apply 1 application topically 2 (two) times daily. Rash on right outer breast & base of left breast   Yes Historical Provider, MD  dextromethorphan-guaiFENesin (MUCINEX DM) 30-600 MG per 12 hr tablet Take 1 tablet by mouth 2 (two) times daily.   Yes Historical Provider, MD  dicyclomine (BENTYL) 10 MG capsule Take 1 capsule (10 mg total) by mouth 3 (three) times daily as needed. IBS Patient taking differently: Take 10 mg by mouth daily as needed for spasms (usualty takes at lunch when needed). IBS 11/25/14  Yes Janith Lima, MD  diltiazem (CARDIZEM CD) 120 MG 24 hr capsule Take 120 mg by mouth daily.   Yes Historical Provider, MD  Eluxadoline (VIBERZI) 100 MG TABS Take 100 mg by mouth 2 (two) times daily with a meal.  02/17/15  Yes Tiffany L Reed, DO  Fluticasone Furoate-Vilanterol (BREO ELLIPTA) 200-25 MCG/INH AEPB Inhale 1 puff into the lungs daily. 09/25/14  Yes Brand Males, MD  levalbuterol (XOPENEX HFA) 45 MCG/ACT inhaler Inhale 1-2 puffs into the lungs every 4 (four) hours as needed for wheezing. 04/24/13  Yes Chesley Mires, MD  Magnesium Oxide (MAGOX 400 PO) Take 1 tablet by mouth daily as needed (leg cramps).   Yes Historical Provider, MD  oxyCODONE (ROXICODONE) 5 MG immediate release tablet Take 1 tablet (5 mg total) by mouth every 6 (six) hours as needed for severe pain. 12/25/14  Yes [name], MD  pravastatin (PRAVACHOL) 20 MG tablet Take 1 tablet (20 mg total) by mouth every evening. 06/23/14  Yes Larey Dresser, MD  promethazine (PHENERGAN) 12.5 MG tablet Take 12.5 mg by mouth every 6 (six) hours as needed for nausea or vomiting.   Yes Historical Provider, MD  rivaroxaban (XARELTO) 20 MG TABS tablet Take 1 tablet (20 mg total) by mouth daily with supper. 02/17/15  Yes Tiffany L Reed, DO  saccharomyces boulardii (FLORASTOR) 250 MG capsule Take 1 capsule (250 mg total) by mouth 2 (two) times daily. 02/17/15  Yes Tiffany L Reed, DO  tiotropium (SPIRIVA) 18 MCG inhalation capsule Place 18 mcg into inhaler and inhale daily.   Yes Historical Provider, MD  traMADol (ULTRAM) 50 MG tablet Take one tablet by mouth every 6 hours as needed for pain 12/29/14  Yes Tiffany L Reed, DO  zolpidem (AMBIEN) 5 MG tablet TAKE ONE TABLET AT BEDTIME AS NEEDED FOR SLEEP Patient taking differently: Take 1 tablet at bedtime for sleep. 07/11/14  Yes Janith Lima, MD  Vitamin D, Ergocalciferol, (DRISDOL) 50000 UNITS CAPS capsule Take 1 capsule (50,000 Units total) by mouth every 7 (seven) days. Patient not taking: Reported on 03/06/2015 06/06/14   Janith Lima, MD   Physical Exam: Filed Vitals:   03/06/15 1448 03/06/15 1700 03/06/15 1755 03/06/15 1756  BP: 154/76 166/57 166/69   Pulse: 99 92 91   Temp: 98.1 F (36.7 C)  98.1  F (36.7 C) 102 F (38.9 C)  TempSrc: Oral  Oral Rectal  Resp: 14  14   SpO2: 90% 92% 94%  General:  Adult female, NAD  Eyes:  PERRL, anicteric, non-injected.  ENT:  Nares clear.  OP clear, non-erythematous without plaques or exudates.  MMM.  Brown tongue  Neck:  Supple without TM or JVD.    Lymph:  No cervical, supraclavicular, or submandibular LAD.  Cardiovascular:  IRRR, normal S1, S2, without m/r/g.  2+ pulses, warm extremities  Respiratory:  Course rales heard anteriorly bilaterally without wheeze or rhonchi, without increased WOB.  Abdomen:  NABS.  Soft, ND/NT even to deep palpation  Skin:  No rashes or focal lesions.  Musculoskeletal:  Normal bulk and tone.  No LE edema.  RLE in boot, 2+ pulse and no swelling  Psychiatric:  A & O x 4.  Appropriate affect.  Neurologic:  CN 3-12 intact except HOH.  4/5 strength upper extremities, 3/5 bilateral lower extremities, right limited somewhat by boot.  Sensation intact.  Labs on Admission:  Basic Metabolic Panel:  Recent Labs Lab 03/06/15 1125  NA 134*  K 3.3*  CL 103  CO2 20*  GLUCOSE 127*  BUN 15  CREATININE 0.47  CALCIUM 8.2*   Liver Function Tests:  Recent Labs Lab 03/06/15 1125  AST 23  ALT 25  ALKPHOS 88  BILITOT 1.0  PROT 5.5*  ALBUMIN 2.6*    Recent Labs Lab 03/06/15 1125  LIPASE <10*   No results for input(s): AMMONIA in the last 168 hours. CBC:  Recent Labs Lab 03/06/15 1125  WBC 13.9*  NEUTROABS 13.0*  HGB 11.5*  HCT 34.4*  MCV 85.8  PLT 232   Cardiac Enzymes: No results for input(s): CKTOTAL, CKMB, CKMBINDEX, TROPONINI in the last 168 hours.  BNP (last 3 results)  Recent Labs  12/06/14 1849  BNP 30.8    ProBNP (last 3 results) No results for input(s): PROBNP in the last 8760 hours.  CBG: No results for input(s): GLUCAP in the last 168 hours.  Radiological Exams on Admission: Dg Chest 2 View  03/06/2015   CLINICAL DATA:  Fever, cough.  EXAM: CHEST  2 VIEW   COMPARISON:  February 27, 2015.  FINDINGS: Stable cardiomediastinal silhouette. No pneumothorax is noted. Minimal bilateral pleural effusions are noted. Stable minimal interstitial densities are noted throughout both lungs concerning for pneumonia or possibly edema. Narrowing of the right subacromial space is noted consistent with rotator cuff injury.  IMPRESSION: Stable mild interstitial densities are noted throughout both lungs as well as minimal bilateral pleural effusions. It is uncertain if this represents pneumonia or edema. Continued radiographic follow-up is recommended.   Electronically Signed   By: Marijo Conception, M.D.   On: 03/06/2015 14:01    EKG: NSR with sinus arrhythmia, prolonged QTc 522.   Assessment/Plan Active Problems:   Essential hypertension   COPD mixed type   Atrial fibrillation   QT prolongation   Trimalleolar fracture of ankle, closed   Ankle fracture, right   Fever   Sepsis  ---  Sepsis (fever, tachycardia, leukocytosis) likely due to C. Diff diarrhea, although viral gastroenteritis also possible.  LFTs, lipase wnl.  -  Pneumonia has been treated with primaxin for 7 days, last day was today.  Findings on CXR likely reflect previous pneumonia and would not expect them to be markedly improved in just one week. -  UA negative -  F/u blood cultures -  C. Diff PCR and enteric precautions -  GI pathogen panel if C. Diff PCR negative -  Oral vancomycin and IV flagyl for now (pending ability to tolerate  PO) -  Needs referral to ID clinic for possible stool transplant post-discharge -  IVF -  Antiemetics, pain medication, supportive care  Occult positive emesis without significantly elevated BUN or gross blood.  May have some gastritis or mild mallory weiss but do not want to risk worsening C. Diff.  Hemoglobin at baseline. -  PPI gtt.  D/c ASAP if she has c. Diff diarrhea -  Hold xarelto  -  Repeat hemoglobin in AM  Prolonged QTc -  Avoid zofran and other QTc  prolonging medications  Recent HCAP, s/p 7 days of primaxin.  A-fib, rate controlled -  Hold a/c due to possible GIB -  Continue dilt as long as blood pressure tolerates  HTN, blood pressure mildly elevated -  Resume diltaizem  COPD, stable, continue breo, spiriva, and prn xopenex  IBS, stable, continue viberzi and bentyl  Irritant and candidal vulvar dermatitis -  Start nystatin cream  Anemia of chronic disease, stable but evidence of microscopic bleeding -  Trend hemoglobin and transfuse for hgb < 7 or symptomatic anemia  Hyponatremia, hypokalemia, and low CO2 likely due to diarrhea -  IVF with potassium and repeat in AM  Diet:  CLD Access:  PIV IVF:  yes Proph:  SCDs  Code Status: DNR Family Communication: patient and her daughter Disposition Plan: Admit to med-surg  Time spent: 60 min Janece Canterbury [facility] Pager [phone]  If 7PM-7AM, please contact night-coverage [email or web address] Password [facility] 03/06/2015, 5:59 PM

## 2015-03-06 NOTE — ED Provider Notes (Signed)
CSN: [id number]     Arrival date & time 03/06/15  1008 History   First MD Initiated Contact with Patient 03/06/15 1055     Chief Complaint  Patient presents with  . Hematemesis  . Blood In Stools     HPI Patient was brought to the emergency department today after developing nausea vomiting and diarrhea and found to have a fever at the nursing facility as well as in the emergency department today.  Temp of 102.4 in the ER.  Family reports ongoing productive cough and recent course of Primaxin completed for pneumonia.  No urinary symptoms.  As reported that there was some blood in the vomit.  Patient reports mild nausea here.  She also had C. difficile several months ago.  This has since resolved.  She denies abdominal pain    Past Medical History  Diagnosis Date  . COPD (chronic obstructive pulmonary disease)   . Asthma   . ALLERGIC RHINITIS   . Hypertension   . Pleural effusion, left   . Colles' fracture of left radius   . Anxiety   . Back pain of thoracolumbar region     right  . Pruritus   . Diastolic dysfunction   . Pulmonary nodule   . IBS (irritable bowel syndrome)   . Bronchiectasis   . Insomnia   . History of pneumonia   . Diverticulosis   . Hx of colonic polyp   . Multiple rib fractures 10/2008    bilateral  . Hx of cardiovascular stress test     a. ETT-MV 1/14: Exercised 4:16, no ECG changes, poor ex tol, ant defect c/w soft tissue atten, no ischemia, EF 75%  . Atrial fibrillation    Past Surgical History  Procedure Laterality Date  . Tonsillectomy    . Appendectomy    . Cholecystectomy    . Polypectomy    . Bladder suspension  2005    A-P  . Rotator cuff repair Right 2006  . Orif wrist fracture Left 2010      Dr. Burney Gauze.  . Total knee arthroplasty Right 08/2010  . Tonsillectomy    . Colonoscopy  04/06/2012    Procedure: COLONOSCOPY;  Surgeon: Jerene Bears, MD;  Location: [facility];  Service: Gastroenterology;  Laterality: N/A;  . External fixation  leg Right 12/12/2014    Procedure: ADJUSTMENT OF EXTERNAL FIXATION  RIGHT ANKLE;  Surgeon: Rod Can, MD;  Location: [place];  Service: Orthopedics;  Laterality: Right;  . Ankle closed reduction Right 12/11/2014    Procedure: CLOSED REDUCTION ANKLE;  Surgeon: Rod Can, MD;  Location: [place];  Service: Orthopedics;  Laterality: Right;  . External fixation leg Right 12/11/2014    Procedure: POSSIBLE EXTERNAL FIXATION RIGHT ANKLE;  Surgeon: Rod Can, MD;  Location: [place];  Service: Orthopedics;  Laterality: Right;  . Hardware removal Right 12/25/2014    Procedure: RIGHT ANKLE REMOVE OF DEEP IMPLANT ANE EXTERNAL FIXATOR ;  Surgeon: [name], MD;  Location: [place];  Service: Orthopedics;  Laterality: Right;  . Orif ankle fracture Right 12/25/2014    Procedure: OPEN REDUCTION INTERNAL FIXATION (ORIF)TRIMALLEOLAR  ANKLE FRACTURE;  Surgeon: [name], MD;  Location: [place];  Service: Orthopedics;  Laterality: Right;   Family History  Problem Relation Age of Onset  . Heart disease Mother   . Colon cancer Neg Hx   . Bipolar disorder Daughter   . Rheumatic fever Mother   . Pneumonia Father  History  Substance Use Topics  . Smoking status: Former Smoker -- 0.30 packs/day for 10 years    Types: Cigarettes    Quit date: 09/19/1948  . Smokeless tobacco: Never Used  . Alcohol Use: No     Comment: rarely   OB History    No data available     Review of Systems  All other systems reviewed and are negative.     Allergies  Albuterol sulfate; Levaquin; Doxycycline; Lactose intolerance (gi); Amoxicillin; Codeine; Penicillins; Sulfa antibiotics; and Sulfonamide derivatives  Home Medications   Prior to Admission medications   Medication Sig Start Date End Date Taking? Authorizing Provider  acetaminophen (TYLENOL) 325 MG tablet Take 2 tablets (650 mg total) by mouth every 6 (six) hours as needed for mild pain (or Fever >/= 101). 12/13/14  Yes  Bonnielee Haff, MD  ALPRAZolam Duanne Moron) 0.25 MG tablet Take 1 tablet (0.25 mg total) by mouth 2 (two) times daily as needed for anxiety. Dr Aundra Dubin will not be able to provide additional refills 12/13/14  Yes Bonnielee Haff, MD  bimatoprost (LUMIGAN) 0.01 % SOLN Place 1 drop into both eyes daily. 08/06/14  Yes Janith Lima, MD  Cholecalciferol (VITAMIN D-3) 1000 UNITS CAPS Take 2,000 Units by mouth daily.   Yes Historical Provider, MD  clotrimazole-betamethasone (LOTRISONE) cream Apply 1 application topically 2 (two) times daily. Rash on right outer breast & base of left breast   Yes Historical Provider, MD  dextromethorphan-guaiFENesin (MUCINEX DM) 30-600 MG per 12 hr tablet Take 1 tablet by mouth 2 (two) times daily.   Yes Historical Provider, MD  dicyclomine (BENTYL) 10 MG capsule Take 1 capsule (10 mg total) by mouth 3 (three) times daily as needed. IBS Patient taking differently: Take 10 mg by mouth daily as needed for spasms (usualty takes at lunch when needed). IBS 11/25/14  Yes Janith Lima, MD  diltiazem (CARDIZEM CD) 120 MG 24 hr capsule Take 120 mg by mouth daily.   Yes Historical Provider, MD  Eluxadoline (VIBERZI) 100 MG TABS Take 100 mg by mouth 2 (two) times daily with a meal. 02/17/15  Yes Tiffany L Reed, DO  Fluticasone Furoate-Vilanterol (BREO ELLIPTA) 200-25 MCG/INH AEPB Inhale 1 puff into the lungs daily. 09/25/14  Yes Brand Males, MD  levalbuterol (XOPENEX HFA) 45 MCG/ACT inhaler Inhale 1-2 puffs into the lungs every 4 (four) hours as needed for wheezing. 04/24/13  Yes Chesley Mires, MD  Magnesium Oxide (MAGOX 400 PO) Take 1 tablet by mouth daily as needed (leg cramps).   Yes Historical Provider, MD  oxyCODONE (ROXICODONE) 5 MG immediate release tablet Take 1 tablet (5 mg total) by mouth every 6 (six) hours as needed for severe pain. 12/25/14  Yes [name], MD  pravastatin (PRAVACHOL) 20 MG tablet Take 1 tablet (20 mg total) by mouth every evening. 06/23/14  Yes Larey Dresser, MD   promethazine (PHENERGAN) 12.5 MG tablet Take 12.5 mg by mouth every 6 (six) hours as needed for nausea or vomiting.   Yes Historical Provider, MD  rivaroxaban (XARELTO) 20 MG TABS tablet Take 1 tablet (20 mg total) by mouth daily with supper. 02/17/15  Yes Tiffany L Reed, DO  saccharomyces boulardii (FLORASTOR) 250 MG capsule Take 1 capsule (250 mg total) by mouth 2 (two) times daily. 02/17/15  Yes Tiffany L Reed, DO  tiotropium (SPIRIVA) 18 MCG inhalation capsule Place 18 mcg into inhaler and inhale daily.   Yes Historical Provider, MD  traMADol (ULTRAM) 50 MG tablet Take  one tablet by mouth every 6 hours as needed for pain 12/29/14  Yes Tiffany L Reed, DO  zolpidem (AMBIEN) 5 MG tablet TAKE ONE TABLET AT BEDTIME AS NEEDED FOR SLEEP Patient taking differently: Take 1 tablet at bedtime for sleep. 07/11/14  Yes Janith Lima, MD  Vitamin D, Ergocalciferol, (DRISDOL) 50000 UNITS CAPS capsule Take 1 capsule (50,000 Units total) by mouth every 7 (seven) days. Patient not taking: Reported on 03/06/2015 06/06/14   Janith Lima, MD   BP 154/76 mmHg  Pulse 99  Temp(Src) 98.1 F (36.7 C) (Oral)  Resp 14  SpO2 90% Physical Exam  Constitutional: She is oriented to person, place, and time. She appears well-developed and well-nourished. No distress.  HENT:  Head: Normocephalic and atraumatic.  Eyes: EOM are normal.  Neck: Normal range of motion.  Cardiovascular: Normal rate, regular rhythm and normal heart sounds.   Pulmonary/Chest: Effort normal and breath sounds normal.  Abdominal: Soft. She exhibits no distension. There is no tenderness.  Genitourinary:  Brown stool.  Hemoccult positive.  No gross blood  Musculoskeletal: Normal range of motion.  Neurological: She is alert and oriented to person, place, and time.  Skin: Skin is warm and dry.  Psychiatric: She has a normal mood and affect. Judgment normal.  Nursing note and vitals reviewed.   ED Course  Procedures (including critical care  time) Labs Review Labs Reviewed  CBC WITH DIFFERENTIAL/PLATELET - Abnormal; Notable for the following:    WBC 13.9 (*)    Hemoglobin 11.5 (*)    HCT 34.4 (*)    Neutrophils Relative % 93 (*)    Neutro Abs 13.0 (*)    Lymphocytes Relative 5 (*)    Monocytes Relative 2 (*)    All other components within normal limits  COMPREHENSIVE METABOLIC PANEL - Abnormal; Notable for the following:    Sodium 134 (*)    Potassium 3.3 (*)    CO2 20 (*)    Glucose, Bld 127 (*)    Calcium 8.2 (*)    Total Protein 5.5 (*)    Albumin 2.6 (*)    All other components within normal limits  LIPASE, BLOOD - Abnormal; Notable for the following:    Lipase <10 (*)    All other components within normal limits  POC OCCULT BLOOD, ED - Abnormal; Notable for the following:    Fecal Occult Bld POSITIVE (*)    All other components within normal limits  CULTURE, BLOOD (ROUTINE X 2)  CULTURE, BLOOD (ROUTINE X 2)  URINE CULTURE  CLOSTRIDIUM DIFFICILE BY PCR (NOT AT [facility])  LACTIC ACID, PLASMA  URINALYSIS, ROUTINE W REFLEX MICROSCOPIC (NOT AT [facility])  TYPE AND SCREEN    Imaging Review Dg Chest 2 View  03/06/2015   CLINICAL DATA:  Fever, cough.  EXAM: CHEST  2 VIEW  COMPARISON:  February 27, 2015.  FINDINGS: Stable cardiomediastinal silhouette. No pneumothorax is noted. Minimal bilateral pleural effusions are noted. Stable minimal interstitial densities are noted throughout both lungs concerning for pneumonia or possibly edema. Narrowing of the right subacromial space is noted consistent with rotator cuff injury.  IMPRESSION: Stable mild interstitial densities are noted throughout both lungs as well as minimal bilateral pleural effusions. It is uncertain if this represents pneumonia or edema. Continued radiographic follow-up is recommended.   Electronically Signed   By: Marijo Conception, M.D.   On: 03/06/2015 14:01  I personally reviewed the imaging tests through PACS system I reviewed available ER/hospitalization records  through the EMR    EKG Interpretation None      MDM   Final diagnoses:  None    Patient has Hemoccult-positive stool.  Her vital signs are stable.  She does have a fever.  This could represent ongoing pneumonia.  Blood cultures obtained.  Urine without signs of infection.  She's had no more vomiting here in the emergency department.  I'm unable to test this for blood.  If she has an upper GI bleed she is definitely not a rapid upper GI bleed.  She is stable.  She'll be admitted the hospital for her fever and ongoing serial CBCs. family updated    Jola Schmidt, MD 03/06/15 [phone]
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
## 2015-03-07 ENCOUNTER — Inpatient Hospital Stay (HOSPITAL_COMMUNITY): Payer: Medicare Other

## 2015-03-07 DIAGNOSIS — I10 Essential (primary) hypertension: Secondary | ICD-10-CM

## 2015-03-07 DIAGNOSIS — I4581 Long QT syndrome: Secondary | ICD-10-CM

## 2015-03-07 LAB — URINE CULTURE

## 2015-03-07 LAB — BASIC METABOLIC PANEL
Anion gap: 5 (ref 5–15)
BUN: 12 mg/dL (ref 6–20)
CALCIUM: 7.8 mg/dL — AB (ref 8.9–10.3)
CO2: 23 mmol/L (ref 22–32)
CREATININE: 0.48 mg/dL (ref 0.44–1.00)
Chloride: 105 mmol/L (ref 101–111)
GFR calc Af Amer: >60 mL/min (ref >60)
GFR calc non Af Amer: >60 mL/min (ref >60)
GLUCOSE: 121 mg/dL — AB (ref 65–99)
Potassium: 3 mmol/L — ABNORMAL LOW (ref 3.5–5.1)
Sodium: 133 mmol/L — ABNORMAL LOW (ref 135–145)

## 2015-03-07 LAB — CBC
HCT: 30.7 % — ABNORMAL LOW (ref 36.0–46.0)
Hemoglobin: 10.2 g/dL — ABNORMAL LOW (ref 12.0–15.0)
MCH: 28.7 pg (ref 26.0–34.0)
MCHC: 33.2 g/dL (ref 30.0–36.0)
MCV: 86.2 fL (ref 78.0–100.0)
PLATELETS: 195 10*3/uL (ref 150–400)
RBC: 3.56 MIL/uL — ABNORMAL LOW (ref 3.87–5.11)
RDW: 14.2 % (ref 11.5–15.5)
WBC: 9.3 10*3/uL (ref 4.0–10.5)

## 2015-03-07 MED ORDER — POTASSIUM CHLORIDE CRYS ER 20 MEQ PO TBCR
40.0000 meq | EXTENDED_RELEASE_TABLET | ORAL | Status: DC
  Administered 2015-03-07: 40 meq via ORAL
  Filled 2015-03-07: qty 2

## 2015-03-07 MED ORDER — POTASSIUM CHLORIDE CRYS ER 20 MEQ PO TBCR
40.0000 meq | EXTENDED_RELEASE_TABLET | Freq: Once | ORAL | Status: AC
  Administered 2015-03-07: 40 meq via ORAL
  Filled 2015-03-07: qty 2

## 2015-03-07 MED ORDER — LIP MEDEX EX OINT: TOPICAL_OINTMENT | CUTANEOUS | Status: DC | PRN

## 2015-03-07 NOTE — Progress Notes (Signed)
[facility] PROGRESS NOTE  Lori [name] YOV:[id number] DOB: [birth year] DOA: 03/06/2015 PCP: Olga Millers, MD  Brief Summary  The patient is a 81 y.o. year-old female with history of COPD/asthma, essential hypertension, anxiety, A. fib on Xarelto, right ankle fracture in 11/2014 and post-operative course complicated by C. Diff diarrhea twice in 12/2014, UTI the following month, and then the week prior to admission she was treated for 7 days with Primaxin for pneumonia. She has been undergoing rehab at [facility] along with her husband who is undergoing treatment for advanced head and neck cancer. She has IBS with frequent diarrhea but has had formed and regular stools recently while taking Florastor which has been recommended as lifelong therapy for her. According to the patient and her RN at [facility] whom I called for more information, she was feeling better with regards to her cough and fever but she became fatigued and lost her appetite. She developed large volume watery brown stool, vomiting with dark brown/black emesis, and fever to 101F.  Her emesis tested positive for occult blood.  She was transferred to the ER for evaluation where her CXR demonstrated residual pneumonia and C. Diff PCR was positive.    Assessment/Plan  Sepsis (fever, tachycardia, leukocytosis) due to C. Diff diarrhea. - Pneumonia has been treated with primaxin for 7 days, last day was today. Findings on CXR likely reflect previous pneumonia and would not expect them to be markedly improved in just one week. - UA negative - Blood cultures NGTD - C. Diff PCR +  -  Enteric precautions - Oral vancomycin and IV flagyl for now (pending ability to tolerate PO) - Needs referral to ID clinic for possible stool transplant post-discharge - d/c IVF - Continue antiemetics, pain medication, supportive care  Occult positive emesis without significantly elevated BUN or gross blood. May have some gastritis and  emesis has resolved. - d/c PPI in setting of C. Diff - Hold xarelto  - hemoglobin trending down slightly  Prolonged QTc - Avoid zofran and other QTc prolonging medications  Recent HCAP, s/p 7 days of primaxin.  A-fib, rate controlled - Hold a/c due to possible GIB - Continue dilt as long as blood pressure tolerates  HTN, blood pressure mildly elevated - Resume diltiazem  COPD, with wheezing today -  Repeat CXR -  continue ICS, spiriva, and prn xopenex  IBS, stable, continue viberzi and bentyl  Irritant and candidal vulvar dermatitis - Continue nystatin cream  Anemia of chronic disease, stable but evidence of microscopic bleeding - Trend hemoglobin and transfuse for hgb < 7 or symptomatic anemia  Hyponatremia, hypokalemia, and low CO2 likely due to diarrhea.  Hyponatremia and hypokalemia slightly worse but CO2 improved. - d/c IVF  -  Oral potassium repletion  Diet: CLD Access: PIV IVF: off Proph: SCDs  Code Status: DNR Family Communication: patient and her daughter Disposition Plan: Admit to med-surg  Consultants:  none  Procedures:  CXR  Antibiotics:  Flagyl IV 6/18 >>  Vancomycin PO 6/18 >>   HPI/Subjective:  Still having abdominal pain and frequent stool incontinence.  Two incontinent stools in last 30 minutes.  Wheezing.      Objective: Filed Vitals:   03/06/15 2135 03/07/15 0000 03/07/15 0200 03/07/15 0548  BP: 105/91 107/44 122/58 137/81  Pulse: 52 70 89 74  Temp: 98.2 F (36.8 C) 98.1 F (36.7 C) 98.7 F (37.1 C) 97.8 F (36.6 C)  TempSrc: Oral Oral Oral Oral  Resp: 16 16 20  20  Height:      Weight:      SpO2: 93% 95% 96% 96%    Intake/Output Summary (Last 24 hours) at 03/07/15 1128 Last data filed at 03/07/15 0930  Gross per 24 hour  Intake 2528.75 ml  Output      0 ml  Net 2528.75 ml   Filed Weights   03/06/15 1923  Weight: 61.1 kg (134 lb 11.2 oz)    Exam:   General:  Thin F, No acute distress,  HOH  HEENT:  NCAT, MMM  Cardiovascular:  IRRR, nl S1, S2 no mrg, 2+ pulses, warm extremities  Respiratory:  Full expiratory wheeze, no rales or rhonchi, no increased WOB  Abdomen:   Hyperactive BS, soft, ND, TTP throughout without rebound or guarding  MSK:   Normal tone and bulk, 1+ pitting edema bilateral lower extremities, slightly worse on the right foot compared to left.  Boot is off and steri-strips on both medial and lateral incisions are intact with good wound edge approximation, no discharge, erythema or induration  Neuro:  Grossly intact  Data Reviewed: Basic Metabolic Panel:  Recent Labs Lab 03/06/15 1125 03/07/15 0516  NA 134* 133*  K 3.3* 3.0*  CL 103 105  CO2 20* 23  GLUCOSE 127* 121*  BUN 15 12  CREATININE 0.47 0.48  CALCIUM 8.2* 7.8*   Liver Function Tests:  Recent Labs Lab 03/06/15 1125  AST 23  ALT 25  ALKPHOS 88  BILITOT 1.0  PROT 5.5*  ALBUMIN 2.6*    Recent Labs Lab 03/06/15 1125  LIPASE <10*   No results for input(s): AMMONIA in the last 168 hours. CBC:  Recent Labs Lab 03/06/15 1125 03/07/15 0516  WBC 13.9* 9.3  NEUTROABS 13.0*  --   HGB 11.5* 10.2*  HCT 34.4* 30.7*  MCV 85.8 86.2  PLT 232 195   Cardiac Enzymes: No results for input(s): CKTOTAL, CKMB, CKMBINDEX, TROPONINI in the last 168 hours. BNP (last 3 results)  Recent Labs  12/06/14 1849  BNP 30.8    ProBNP (last 3 results) No results for input(s): PROBNP in the last 8760 hours.  CBG: No results for input(s): GLUCAP in the last 168 hours.  Recent Results (from the past 240 hour(s))  Clostridium Difficile by PCR (not at [facility])     Status: Abnormal   Collection Time: 03/06/15  3:26 PM  Result Value Ref Range Status   C difficile by pcr POSITIVE (A) NEGATIVE Final    Comment: CRITICAL RESULT CALLED TO, READ BACK BY AND VERIFIED WITH: Barry Dienes [id number] @ 1924 BY J SCOTTON   MRSA PCR Screening     Status: None   Collection Time: 03/06/15  8:39 PM   Result Value Ref Range Status   MRSA by PCR NEGATIVE NEGATIVE Final    Comment:        The GeneXpert MRSA Assay (FDA approved for NASAL specimens only), is one component of a comprehensive MRSA colonization surveillance program. It is not intended to diagnose MRSA infection nor to guide or monitor treatment for MRSA infections.      Studies: Dg Chest 2 View  03/06/2015   CLINICAL DATA:  Fever, cough.  EXAM: CHEST  2 VIEW  COMPARISON:  February 27, 2015.  FINDINGS: Stable cardiomediastinal silhouette. No pneumothorax is noted. Minimal bilateral pleural effusions are noted. Stable minimal interstitial densities are noted throughout both lungs concerning for pneumonia or possibly edema. Narrowing of the right subacromial space is noted consistent with rotator cuff injury.  IMPRESSION: Stable mild interstitial densities are noted throughout both lungs as well as minimal bilateral pleural effusions. It is uncertain if this represents pneumonia or edema. Continued radiographic follow-up is recommended.   Electronically Signed   By: Marijo Conception, M.D.   On: 03/06/2015 14:01    Scheduled Meds: . clotrimazole   Topical BID  . dextromethorphan-guaiFENesin  1 tablet Oral BID  . diltiazem  120 mg Oral Daily  . Eluxadoline  100 mg Oral BID WC  . Fluticasone Furoate-Vilanterol  1 puff Inhalation Daily  . latanoprost  1 drop Both Eyes QHS  . vancomycin  500 mg Oral 4 times per day   And  . metronidazole  500 mg Intravenous 3 times per day  . nystatin cream   Topical BID  . [START ON 03/09/2015] pantoprazole (PROTONIX) IV  40 mg Intravenous Q12H  . potassium chloride  40 mEq Oral Q4H  . saccharomyces boulardii  250 mg Oral BID  . tiotropium  18 mcg Inhalation Daily   Continuous Infusions: . dextrose 5 % and 0.45 % NaCl with KCl 20 mEq/L 75 mL/hr at 03/07/15 1041    Active Problems:   Essential hypertension   COPD mixed type   Atrial fibrillation   QT prolongation   Trimalleolar fracture  of ankle, closed   Ankle fracture, right   Fever   Sepsis   Diarrhea    Time spent: 30 min    Aragorn Recker, [facility] Pager [phone]. If 7PM-7AM, please contact night-coverage at [email or web address], password [facility] 03/07/2015, 11:28 AM  LOS: 1 day

## 2015-03-07 NOTE — Progress Notes (Signed)
Pt informed that pharmacy does not have Breo inhaler nor Viberzi that pt takes for IBS symptoms.  Pt will ask family to bring meds from home.

## 2015-03-08 DIAGNOSIS — A419 Sepsis, unspecified organism: Principal | ICD-10-CM

## 2015-03-08 DIAGNOSIS — A047 Enterocolitis due to Clostridium difficile: Secondary | ICD-10-CM

## 2015-03-08 LAB — BASIC METABOLIC PANEL WITH GFR
Anion gap: 6 (ref 5–15)
BUN: 7 mg/dL (ref 6–20)
CO2: 23 mmol/L (ref 22–32)
Calcium: 8.4 mg/dL — ABNORMAL LOW (ref 8.9–10.3)
Chloride: 106 mmol/L (ref 101–111)
Creatinine, Ser: 0.47 mg/dL (ref 0.44–1.00)
GFR calc Af Amer: >60 mL/min
GFR calc non Af Amer: >60 mL/min
Glucose, Bld: 96 mg/dL (ref 65–99)
Potassium: 4 mmol/L (ref 3.5–5.1)
Sodium: 135 mmol/L (ref 135–145)

## 2015-03-08 LAB — CBC
HEMATOCRIT: 31.3 % — AB (ref 36.0–46.0)
Hemoglobin: 10.3 g/dL — ABNORMAL LOW (ref 12.0–15.0)
MCH: 28.2 pg (ref 26.0–34.0)
MCHC: 32.9 g/dL (ref 30.0–36.0)
MCV: 85.8 fL (ref 78.0–100.0)
Platelets: 204 10*3/uL (ref 150–400)
RBC: 3.65 MIL/uL — AB (ref 3.87–5.11)
RDW: 14.2 % (ref 11.5–15.5)
WBC: 5.9 10*3/uL (ref 4.0–10.5)

## 2015-03-08 MED ORDER — METRONIDAZOLE IN NACL 5-0.79 MG/ML-% IV SOLN
500.0000 mg | Freq: Three times a day (TID) | INTRAVENOUS | Status: DC
  Administered 2015-03-08 – 2015-03-09 (×3): 500 mg via INTRAVENOUS
  Filled 2015-03-08 (×5): qty 100

## 2015-03-08 MED ORDER — BOOST / RESOURCE BREEZE PO LIQD: 1.0000 | Freq: Three times a day (TID) | ORAL | Status: DC

## 2015-03-08 NOTE — Progress Notes (Signed)
[facility] PROGRESS NOTE  Lori [name] MOQ:[id number] DOB: [birth year] DOA: 03/06/2015 PCP: Olga Millers, MD  Brief Summary  The patient is a 81 y.o. year-old female with history of COPD/asthma, essential hypertension, anxiety, A. fib on Xarelto, right ankle fracture in 11/2014 and post-operative course complicated by C. Diff diarrhea twice in 12/2014, UTI the following month, and then the week prior to admission she was treated for 7 days with Primaxin for pneumonia. She has been undergoing rehab at [facility] along with her husband who is undergoing treatment for advanced head and neck cancer. She has IBS with frequent diarrhea but has had formed and regular stools recently while taking Florastor which has been recommended as lifelong therapy for her. According to the patient and her RN at [facility] whom I called for more information, she was feeling better with regards to her cough and fever but she became fatigued and lost her appetite. She developed large volume watery brown stool, vomiting with dark brown/black emesis, and fever to 101F.  Her emesis tested positive for occult blood.  She was transferred to the ER for evaluation where her CXR demonstrated residual pneumonia and C. Diff PCR was positive.    Assessment/Plan  Sepsis (fever, tachycardia, leukocytosis) due to C. Diff diarrhea, sepsis physiology resolved but still having very frequent watery stools Constant cleanup according to CNA although not all documented in epic. - Pneumonia has been treated with primaxin for 7 days, last day was today. Findings on CXR likely reflect previous pneumonia and would not expect them to be markedly improved in just one week. - UA negative - Blood cultures NGTD - C. Diff PCR +  -  Enteric precautions - Continue oral vancomycin and IV flagyl for one more day, then d/c IV flagyl - Needs referral to ID clinic for possible stool transplant post-discharge - Continue antiemetics,  pain medication, supportive care  Occult positive emesis without significantly elevated BUN or gross blood. May have some gastritis and emesis has resolved. - no PPI in setting of C. Diff - hemoglobin stable around 10.3mg /dl -  Advance to a soft diet  Prolonged QTc - Avoid zofran and other QTc prolonging medications  Recent HCAP, s/p 7 days of primaxin.  A-fib, rate controlled - resume a/c in AM if hemoglobin remains stable - Continue dilt as long as blood pressure tolerates  HTN, blood pressure mildly elevated - Resume diltiazem  COPD, wheezing imprved -  Repeat CXR -  continue ICS, spiriva, and prn xopenex  IBS, stable, continue viberzi and bentyl  Irritant and candidal vulvar dermatitis - Continue nystatin cream  Anemia of chronic disease, stable but evidence of microscopic bleeding - Trend hemoglobin and transfuse for hgb < 7 or symptomatic anemia  Hyponatremia, hypokalemia, and low CO2 likely due to diarrhea.  Resolved with IVF and oral repletion of potassium  Right ankle fracture -  Will notify Dr. Doran Durand in AM of patient's admission so she could possibly have follow up in hospital as opposed to returning for appointment  Diet: soft diet Access: PIV IVF: off Proph: SCDs  Code Status: DNR Family Communication: patient and her daughter Disposition Plan:  Discharge once diarrhea has slowed  Consultants:  none  Procedures:  CXR  Antibiotics:  Flagyl IV 6/18 >>  Vancomycin PO 6/18 >>   HPI/Subjective:  CNA and RN report stools have slowed a little since yesterday but she had already had three watery incontinent BMs this morning when I rounded.  Yesterday, they would barely  get her cleaned up before she would have more stool incontinence.  Worried about her beach trip this weekend and her hair appointment and her orthopedics doctor appointment.  Objective: Filed Vitals:   03/07/15 1425 03/07/15 2136 03/08/15 0643 03/08/15 1500  BP: 124/76  160/60 138/67 132/72  Pulse: 72 81 78 74  Temp: 98.6 F (37 C) 97.7 F (36.5 C) 98.1 F (36.7 C) 98.6 F (37 C)  TempSrc: Oral Axillary Oral Oral  Resp: 18 20 18 18   Height:      Weight:      SpO2: 96% 96% 95% 96%    Intake/Output Summary (Last 24 hours) at 03/08/15 1723 Last data filed at 03/08/15 1246  Gross per 24 hour  Intake    360 ml  Output      0 ml  Net    360 ml   Filed Weights   03/06/15 1923  Weight: 61.1 kg (134 lb 11.2 oz)    Exam:   General:  Thin F, No acute distress, HOH  HEENT:  NCAT, MMM  Cardiovascular:  IRRR, nl S1, S2 no mrg, 2+ pulses, warm extremities  Respiratory:  improved wheeze, no rales or rhonchi, no increased WOB  Abdomen:   Hyperactive BS, soft, ND, TTP throughout without rebound or guarding  MSK:   Normal tone and bulk, 1+ pitting edema bilateral lower extremities, slightly worse on the right foot compared to left.  Boot is off and steri-strips on both medial and lateral incisions are intact with good wound edge approximation, no discharge, erythema or induration  Neuro:  Grossly intact  Data Reviewed: Basic Metabolic Panel:  Recent Labs Lab 03/06/15 1125 03/07/15 0516 03/08/15 0526  NA 134* 133* 135  K 3.3* 3.0* 4.0  CL 103 105 106  CO2 20* 23 23  GLUCOSE 127* 121* 96  BUN 15 12 7   CREATININE 0.47 0.48 0.47  CALCIUM 8.2* 7.8* 8.4*   Liver Function Tests:  Recent Labs Lab 03/06/15 1125  AST 23  ALT 25  ALKPHOS 88  BILITOT 1.0  PROT 5.5*  ALBUMIN 2.6*    Recent Labs Lab 03/06/15 1125  LIPASE <10*   No results for input(s): AMMONIA in the last 168 hours. CBC:  Recent Labs Lab 03/06/15 1125 03/07/15 0516 03/08/15 0526  WBC 13.9* 9.3 5.9  NEUTROABS 13.0*  --   --   HGB 11.5* 10.2* 10.3*  HCT 34.4* 30.7* 31.3*  MCV 85.8 86.2 85.8  PLT 232 195 204   Cardiac Enzymes: No results for input(s): CKTOTAL, CKMB, CKMBINDEX, TROPONINI in the last 168 hours. BNP (last 3 results)  Recent Labs   12/06/14 1849  BNP 30.8    ProBNP (last 3 results) No results for input(s): PROBNP in the last 8760 hours.  CBG: No results for input(s): GLUCAP in the last 168 hours.  Recent Results (from the past 240 hour(s))  Blood culture (routine x 2)     Status: None (Preliminary result)   Collection Time: 03/06/15 11:25 AM  Result Value Ref Range Status   Specimen Description BLOOD RIGHT WRIST  Final   Special Requests BOTTLES DRAWN AEROBIC AND ANAEROBIC 5ML  Final   Culture   Final    NO GROWTH 2 DAYS Performed at [facility]    Report Status PENDING  Incomplete  Urine culture     Status: None   Collection Time: 03/06/15 11:35 AM  Result Value Ref Range Status   Specimen Description URINE, CATHETERIZED  Final  Special Requests NONE  Final   Culture   Final    >=100,000 COLONIES/mL YEAST Performed at [facility]    Report Status 03/07/2015 FINAL  Final  Blood culture (routine x 2)     Status: None (Preliminary result)   Collection Time: 03/06/15 11:51 AM  Result Value Ref Range Status   Specimen Description BLOOD RIGHT ANTECUBITAL  Final   Special Requests BOTTLES DRAWN AEROBIC AND ANAEROBIC 5CC  Final   Culture   Final    NO GROWTH 2 DAYS Performed at [facility]    Report Status PENDING  Incomplete  Clostridium Difficile by PCR (not at [facility])     Status: Abnormal   Collection Time: 03/06/15  3:26 PM  Result Value Ref Range Status   C difficile by pcr POSITIVE (A) NEGATIVE Final    Comment: CRITICAL RESULT CALLED TO, READ BACK BY AND VERIFIED WITH: Barry Dienes [id number] @ 1924 BY J SCOTTON   MRSA PCR Screening     Status: None   Collection Time: 03/06/15  8:39 PM  Result Value Ref Range Status   MRSA by PCR NEGATIVE NEGATIVE Final    Comment:        The GeneXpert MRSA Assay (FDA approved for NASAL specimens only), is one component of a comprehensive MRSA colonization surveillance program. It is not intended to diagnose MRSA infection nor  to guide or monitor treatment for MRSA infections.      Studies: Dg Chest Port 1 View  03/07/2015   CLINICAL DATA:  Wheezing and Cordarious Zeek breath with cough. Former smoker. Patient quit some 60 years prior.  EXAM: PORTABLE CHEST - 1 VIEW  COMPARISON:  Radiographs 03/06/2015  FINDINGS: Normal cardiac silhouette. Lungs are hyperinflated. There is chronic bronchitic markings not changed Wilber Fini interval. Mild interstitial linear markings. The there some focality in the right upper lobe and to lesser degree right upper lobe. No effusion, infiltrate, pneumothorax.  IMPRESSION: 1. No interval change in Cambryn Charters interval. 2. Hyperinflated lungs with chronic bronchitic markings. 3. Mild interstitial edema. 4. Mild opacities in the upper lobes are stable.   Electronically Signed   By: Suzy Bouchard M.D.   On: 03/07/2015 13:14    Scheduled Meds: . clotrimazole   Topical BID  . dextromethorphan-guaiFENesin  1 tablet Oral BID  . diltiazem  120 mg Oral Daily  . Eluxadoline  100 mg Oral BID WC  . Fluticasone Furoate-Vilanterol  1 puff Inhalation Daily  . latanoprost  1 drop Both Eyes QHS  . metronidazole  500 mg Intravenous Q8H  . nystatin cream   Topical BID  . saccharomyces boulardii  250 mg Oral BID  . tiotropium  18 mcg Inhalation Daily  . vancomycin  500 mg Oral 4 times per day   Continuous Infusions:    Principal Problem:   C. difficile diarrhea Active Problems:   Essential hypertension   COPD mixed type   Atrial fibrillation   QT prolongation   Trimalleolar fracture of ankle, closed   Ankle fracture, right   Fever   Sepsis   Diarrhea    Time spent: 30 min    Alivya Wegman, [facility] Pager [phone]. If 7PM-7AM, please contact night-coverage at [email or web address], password [facility] 03/08/2015, 5:23 PM  LOS: 2 days

## 2015-03-09 ENCOUNTER — Telehealth: Payer: Self-pay | Admitting: *Deleted

## 2015-03-09 LAB — BASIC METABOLIC PANEL
Anion gap: 6 (ref 5–15)
BUN: 6 mg/dL (ref 6–20)
CO2: 27 mmol/L (ref 22–32)
Calcium: 8.8 mg/dL — ABNORMAL LOW (ref 8.9–10.3)
Chloride: 102 mmol/L (ref 101–111)
Creatinine, Ser: 0.58 mg/dL (ref 0.44–1.00)
GFR calc Af Amer: >60 mL/min (ref >60)
GLUCOSE: 92 mg/dL (ref 65–99)
Potassium: 4.2 mmol/L (ref 3.5–5.1)
Sodium: 135 mmol/L (ref 135–145)

## 2015-03-09 LAB — CBC
HCT: 33.6 % — ABNORMAL LOW (ref 36.0–46.0)
Hemoglobin: 11.2 g/dL — ABNORMAL LOW (ref 12.0–15.0)
MCH: 28.5 pg (ref 26.0–34.0)
MCHC: 33.3 g/dL (ref 30.0–36.0)
MCV: 85.5 fL (ref 78.0–100.0)
Platelets: 238 10*3/uL (ref 150–400)
RBC: 3.93 MIL/uL (ref 3.87–5.11)
RDW: 14.1 % (ref 11.5–15.5)
WBC: 4.9 10*3/uL (ref 4.0–10.5)

## 2015-03-09 MED ORDER — TRAMADOL HCL 50 MG PO TABS: ORAL_TABLET | ORAL | Status: DC

## 2015-03-09 MED ORDER — NYSTATIN 100000 UNIT/GM EX CREA: TOPICAL_CREAM | Freq: Two times a day (BID) | CUTANEOUS | Status: DC

## 2015-03-09 MED ORDER — BOOST / RESOURCE BREEZE PO LIQD: 1.0000 | Freq: Three times a day (TID) | ORAL | Status: DC

## 2015-03-09 MED ORDER — VANCOMYCIN 50 MG/ML ORAL SOLUTION: 500.0000 mg | Freq: Four times a day (QID) | ORAL | Status: DC

## 2015-03-09 MED ORDER — ZOLPIDEM TARTRATE 5 MG PO TABS: ORAL_TABLET | ORAL | Status: DC

## 2015-03-09 MED ORDER — ALPRAZOLAM 0.25 MG PO TABS: 0.2500 mg | ORAL_TABLET | Freq: Two times a day (BID) | ORAL | Status: DC | PRN

## 2015-03-09 MED ORDER — MOMETASONE FURO-FORMOTEROL FUM 200-5 MCG/ACT IN AERO
2.0000 | INHALATION_SPRAY | Freq: Two times a day (BID) | RESPIRATORY_TRACT | Status: DC
Start: 2015-03-09 — End: 2015-03-09
  Administered 2015-03-09: 2 via RESPIRATORY_TRACT
  Filled 2015-03-09: qty 8.8

## 2015-03-09 NOTE — Telephone Encounter (Signed)
Pt was d/c 03/09/15 but was sent to SNF for PT for 2 wks. appt has been set-up for 03/24/15 with Dr. Doug Sou...Johny Chess

## 2015-03-09 NOTE — Discharge Summary (Signed)
Physician Discharge Summary  Erendira Crabtree Repass AOZ:[id number] DOB: 1926/04/11 DOA: 03/06/2015  PCP: Olga Millers, MD  Admit date: 03/06/2015 Discharge date: 03/09/2015  Recommendations for Outpatient Follow-up:  1.   Transfer to SNF for ongoing PT/OT  2.   Follow up with [place] at 3:45 PM 3.   Continue vancomycin through July 1st, then stop if diarrhea has resolved.  If she continues to have diarrhea, please continue her course of antibiotics for an additional week. 4.   Infectious disease clinic 1 month post discharge for recurrent C. Diff diarrhea 5.  Please repeat CBC and BMP in 1-2 weeks  Discharge Diagnoses:  Principal Problem:   C. difficile diarrhea Active Problems:   Essential hypertension   COPD mixed type   Atrial fibrillation   QT prolongation   Trimalleolar fracture of ankle, closed   Ankle fracture, right   Fever   Sepsis   Diarrhea   Discharge Condition: stable, improved  Diet recommendation: regular  Wt Readings from Last 3 Encounters:  03/06/15 61.1 kg (134 lb 11.2 oz)  02/17/15 60.328 kg (133 lb)  02/09/15 59.149 kg (130 lb 6.4 oz)    History of present illness:  The patient is a 81 y.o. year-old female with history of COPD/asthma, essential hypertension, anxiety, A. fib on Xarelto, right ankle fracture in 11/2014 and post-operative course complicated by C. Diff diarrhea twice in 12/2014, UTI the following month, and then the week prior to admission she was treated for 7 days with Primaxin for pneumonia. She has been undergoing rehab at [facility] along with her husband who is undergoing treatment for advanced head and neck cancer. She has IBS with frequent diarrhea but has had formed and regular stools recently while taking Florastor which has been recommended as lifelong therapy for her. According to the patient and her RN at [facility] whom I called for more information, she was feeling better with regards to her cough and fever but she  became fatigued and lost her appetite. She developed large volume watery brown stool, vomiting with dark brown/black emesis, and fever to 101F. Her emesis tested positive for occult blood. She was transferred to the ER for evaluation where her CXR demonstrated residual pneumonia and C. Diff PCR was positive.  She was started on IV flagyl and oral vancomycin and her diarrhea slowed, her leukocytosis resolved.    Hospital Course:   Sepsis (fever, tachycardia, leukocytosis) due to C. Diff diarrhea, sepsis physiology resolved with IV flagyl and oral vancomycin.   - Findings on CXR likely reflect previous pneumonia. - UA negative - Blood cultures NGTD - C. Diff PCR +  - Continue oral vancomycin - Needs referral to ID clinic for possible stool transplant post-discharge - Continue antiemetics, pain medication, supportive care  Occult positive emesis without significantly elevated BUN or gross blood. May have some gastritis and emesis has resolved. - no PPI in setting of C. Diff - hemoglobin stable around 10.3mg /dl - Advance to a soft diet  Prolonged QTc - Avoid zofran and other QTc prolonging medications  Recent HCAP, s/p 7 days of primaxin.  A-fib, rate controlled - resume a/c in AM if hemoglobin remains stable - Continue dilt as long as blood pressure tolerates  HTN, blood pressure mildly elevated - Resume diltiazem  COPD, wheezing imprved - Repeat CXR - continue ICS, spiriva, and prn xopenex  IBS, stable, continue viberzi and bentyl  Irritant and candidal vulvar dermatitis - Continue nystatin cream  Anemia of chronic disease, stable but  evidence of microscopic bleeding - Trend hemoglobin and transfuse for hgb < 7 or symptomatic anemia  Hyponatremia, hypokalemia, and low CO2 likely due to diarrhea. Resolved with IVF and oral repletion of potassium  Right ankle fracture - Will notify Dr. Doran Durand in AM of patient's admission so she could possibly have  follow up in hospital as opposed to returning for appointment  Consultants:  none  Procedures:  CXR  Antibiotics:  Flagyl IV 6/18 >>  Vancomycin PO 6/18 >>  Discharge Exam: Filed Vitals:   03/09/15 0539  BP: 154/91  Pulse: 93  Temp: 98.1 F (36.7 C)  Resp: 18   Filed Vitals:   03/08/15 0643 03/08/15 1500 03/08/15 2109 03/09/15 0539  BP: 138/67 132/72 166/65 154/91  Pulse: 78 74 102 93  Temp: 98.1 F (36.7 C) 98.6 F (37 C) 98.4 F (36.9 C) 98.1 F (36.7 C)  TempSrc: Oral Oral Oral Oral  Resp: 18 18 18 18   Height:      Weight:      SpO2: 95% 96% 96% 96%     General: Thin F, No acute distress, HOH  HEENT: NCAT, MMM  Cardiovascular: IRRR, nl S1, S2 no mrg, 2+ pulses, warm extremities  Respiratory: CTAB, no increased WOB  Abdomen: Hyperactive BS, soft, ND, TTP throughout without rebound or guarding  MSK: Normal tone and bulk, 1+ pitting edema bilateral lower extremities, slightly worse on the right foot compared to left. Boot is off and steri-strips on both medial and lateral incisions are intact with good wound edge approximation, no discharge, erythema or induration  Neuro: Grossly intact  Discharge Instructions      Discharge Instructions    Call MD for:  difficulty breathing, headache or visual disturbances    Complete by:  As directed      Call MD for:  extreme fatigue    Complete by:  As directed      Call MD for:  hives    Complete by:  As directed      Call MD for:  persistant dizziness or light-headedness    Complete by:  As directed      Call MD for:  persistant nausea and vomiting    Complete by:  As directed      Call MD for:  redness, tenderness, or signs of infection (pain, swelling, redness, odor or green/yellow discharge around incision site)    Complete by:  As directed      Call MD for:  severe uncontrolled pain    Complete by:  As directed      Call MD for:  temperature >100.4    Complete by:  As directed      Diet  general    Complete by:  As directed      Discharge instructions    Complete by:  As directed   You were hospitalized with C. Diff diarrhea, infectious diarrhea caused by exposure to multiple antibiotics.  Please continue vancomycin four times per day for the next week and a half, through July 1st, then stop if your diarrhea has stopped.  Your supervising doctor or primary care doctor may need to extend your course of antibiotics if you are still having diarrhea.  Please make an appointment with the infectious disease clinic (any provider) for about one month from now to talk to them about additional treatment options for C. Diff diarrhea.  Please continue to take florastor for the rest of your life.     Increase activity  slowly    Complete by:  As directed             Medication List    STOP taking these medications        oxyCODONE 5 MG immediate release tablet  Commonly known as:  ROXICODONE     Vitamin D-3 1000 UNITS Caps      TAKE these medications        acetaminophen 325 MG tablet  Commonly known as:  TYLENOL  Take 2 tablets (650 mg total) by mouth every 6 (six) hours as needed for mild pain (or Fever >/= 101).     ALPRAZolam 0.25 MG tablet  Commonly known as:  XANAX  Take 1 tablet (0.25 mg total) by mouth 2 (two) times daily as needed for anxiety. Dr Aundra Dubin will not be able to provide additional refills     bimatoprost 0.01 % Soln  Commonly known as:  LUMIGAN  Place 1 drop into both eyes daily.     clotrimazole-betamethasone cream  Commonly known as:  LOTRISONE  Apply 1 application topically 2 (two) times daily. Rash on right outer breast & base of left breast     dextromethorphan-guaiFENesin 30-600 MG per 12 hr tablet  Commonly known as:  MUCINEX DM  Take 1 tablet by mouth 2 (two) times daily.     dicyclomine 10 MG capsule  Commonly known as:  BENTYL  Take 1 capsule (10 mg total) by mouth 3 (three) times daily as needed. IBS     diltiazem 120 MG 24 hr capsule   Commonly known as:  CARDIZEM CD  Take 120 mg by mouth daily.     Eluxadoline 100 MG Tabs  Commonly known as:  VIBERZI  Take 100 mg by mouth 2 (two) times daily with a meal.     feeding supplement (RESOURCE BREEZE) Liqd  Take 1 Container by mouth 3 (three) times daily between meals.     Fluticasone Furoate-Vilanterol 200-25 MCG/INH Aepb  Commonly known as:  BREO ELLIPTA  Inhale 1 puff into the lungs daily.     levalbuterol 45 MCG/ACT inhaler  Commonly known as:  XOPENEX HFA  Inhale 1-2 puffs into the lungs every 4 (four) hours as needed for wheezing.     MAGOX 400 PO  Take 1 tablet by mouth daily as needed (leg cramps).     nystatin cream  Commonly known as:  MYCOSTATIN  Apply topically 2 (two) times daily. Apply to vulvar area     pravastatin 20 MG tablet  Commonly known as:  PRAVACHOL  Take 1 tablet (20 mg total) by mouth every evening.     promethazine 12.5 MG tablet  Commonly known as:  PHENERGAN  Take 12.5 mg by mouth every 6 (six) hours as needed for nausea or vomiting.     rivaroxaban 20 MG Tabs tablet  Commonly known as:  XARELTO  Take 1 tablet (20 mg total) by mouth daily with supper.     saccharomyces boulardii 250 MG capsule  Commonly known as:  FLORASTOR  Take 1 capsule (250 mg total) by mouth 2 (two) times daily.     tiotropium 18 MCG inhalation capsule  Commonly known as:  SPIRIVA  Place 18 mcg into inhaler and inhale daily.     traMADol 50 MG tablet  Commonly known as:  ULTRAM  Take one tablet by mouth every 6 hours as needed for pain     vancomycin 50 mg/mL oral solution  Commonly known as:  VANCOCIN  Take 10 mLs (500 mg total) by mouth every 6 (six) hours.     zolpidem 5 MG tablet  Commonly known as:  AMBIEN  TAKE ONE TABLET AT BEDTIME AS NEEDED FOR SLEEP       Follow-up Information    Follow up with Olga Millers, MD. Schedule an appointment as soon as possible for a visit in 2 weeks.   Specialty:  Internal Medicine   Contact  information:   [address] [place] [address] [phone]       Follow up with [name], MD On 03/09/2015.   Specialty:  Orthopedic Surgery   Why:  3:45PM   Contact information:   [address] [place] [address] [phone]       Follow up with Alcide Evener, MD. Schedule an appointment as soon as possible for a visit in 1 month.   Specialty:  Infectious Diseases   Contact information:   [address] [place] [address] [phone]        The results of significant diagnostics from this hospitalization (including imaging, microbiology, ancillary and laboratory) are listed below for reference.    Significant Diagnostic Studies: Dg Chest 2 View  03/06/2015   CLINICAL DATA:  Fever, cough.  EXAM: CHEST  2 VIEW  COMPARISON:  February 27, 2015.  FINDINGS: Stable cardiomediastinal silhouette. No pneumothorax is noted. Minimal bilateral pleural effusions are noted. Stable minimal interstitial densities are noted throughout both lungs concerning for pneumonia or possibly edema. Narrowing of the right subacromial space is noted consistent with rotator cuff injury.  IMPRESSION: Stable mild interstitial densities are noted throughout both lungs as well as minimal bilateral pleural effusions. It is uncertain if this represents pneumonia or edema. Continued radiographic follow-up is recommended.   Electronically Signed   By: Marijo Conception, M.D.   On: 03/06/2015 14:01   Dg Chest 2 View  02/27/2015   CLINICAL DATA:  Pneumonia. Cough, congestion, and some chest pain for 4-5 days. Fever. Remote history of smoking.  EXAM: CHEST  2 VIEW  COMPARISON:  12/11/2014  FINDINGS: The cardiomediastinal silhouette is within normal limits. There is new peribronchial thickening and reticular densities throughout much of the right lung and in the left lower lung. There are small bilateral pleural effusions which are new. No lobar consolidation is  seen. No pneumothorax. Proximal right humeral suture anchor is again noted.  IMPRESSION: New interstitial opacities involving the right greater than left lungs, concerning for pneumonia. Small bilateral pleural effusions. Followup PA and lateral chest X-ray is recommended in 3-4 weeks following trial of antibiotic therapy to ensure resolution and exclude underlying malignancy.   Electronically Signed   By: Logan Bores   On: 02/27/2015 14:33   Dg Chest Port 1 View  03/07/2015   CLINICAL DATA:  Wheezing and Makyla Bye breath with cough. Former smoker. Patient quit some 60 years prior.  EXAM: PORTABLE CHEST - 1 VIEW  COMPARISON:  Radiographs 03/06/2015  FINDINGS: Normal cardiac silhouette. Lungs are hyperinflated. There is chronic bronchitic markings not changed Johany Hansman interval. Mild interstitial linear markings. The there some focality in the right upper lobe and to lesser degree right upper lobe. No effusion, infiltrate, pneumothorax.  IMPRESSION: 1. No interval change in Alvine Mostafa interval. 2. Hyperinflated lungs with chronic bronchitic markings. 3. Mild interstitial edema. 4. Mild opacities in the upper lobes are stable.   Electronically Signed   By: Helane Gunther.D.  On: 03/07/2015 13:14    Microbiology: Recent Results (from the past 240 hour(s))  Blood culture (routine x 2)     Status: None (Preliminary result)   Collection Time: 03/06/15 11:25 AM  Result Value Ref Range Status   Specimen Description BLOOD RIGHT WRIST  Final   Special Requests BOTTLES DRAWN AEROBIC AND ANAEROBIC 5ML  Final   Culture   Final    NO GROWTH 2 DAYS Performed at [facility]    Report Status PENDING  Incomplete  Urine culture     Status: None   Collection Time: 03/06/15 11:35 AM  Result Value Ref Range Status   Specimen Description URINE, CATHETERIZED  Final   Special Requests NONE  Final   Culture   Final    >=100,000 COLONIES/mL YEAST Performed at [facility]    Report Status 03/07/2015 FINAL   Final  Blood culture (routine x 2)     Status: None (Preliminary result)   Collection Time: 03/06/15 11:51 AM  Result Value Ref Range Status   Specimen Description BLOOD RIGHT ANTECUBITAL  Final   Special Requests BOTTLES DRAWN AEROBIC AND ANAEROBIC 5CC  Final   Culture   Final    NO GROWTH 2 DAYS Performed at [facility]    Report Status PENDING  Incomplete  Clostridium Difficile by PCR (not at [facility])     Status: Abnormal   Collection Time: 03/06/15  3:26 PM  Result Value Ref Range Status   C difficile by pcr POSITIVE (A) NEGATIVE Final    Comment: CRITICAL RESULT CALLED TO, READ BACK BY AND VERIFIED WITH: Barry Dienes [id number] @ 1924 BY J SCOTTON   MRSA PCR Screening     Status: None   Collection Time: 03/06/15  8:39 PM  Result Value Ref Range Status   MRSA by PCR NEGATIVE NEGATIVE Final    Comment:        The GeneXpert MRSA Assay (FDA approved for NASAL specimens only), is one component of a comprehensive MRSA colonization surveillance program. It is not intended to diagnose MRSA infection nor to guide or monitor treatment for MRSA infections.      Labs: Basic Metabolic Panel:  Recent Labs Lab 03/06/15 1125 03/07/15 0516 03/08/15 0526 03/09/15 0538  NA 134* 133* 135 135  K 3.3* 3.0* 4.0 4.2  CL 103 105 106 102  CO2 20* 23 23 27   GLUCOSE 127* 121* 96 92  BUN 15 12 7 6   CREATININE 0.47 0.48 0.47 0.58  CALCIUM 8.2* 7.8* 8.4* 8.8*   Liver Function Tests:  Recent Labs Lab 03/06/15 1125  AST 23  ALT 25  ALKPHOS 88  BILITOT 1.0  PROT 5.5*  ALBUMIN 2.6*    Recent Labs Lab 03/06/15 1125  LIPASE <10*   No results for input(s): AMMONIA in the last 168 hours. CBC:  Recent Labs Lab 03/06/15 1125 03/07/15 0516 03/08/15 0526 03/09/15 0538  WBC 13.9* 9.3 5.9 4.9  NEUTROABS 13.0*  --   --   --   HGB 11.5* 10.2* 10.3* 11.2*  HCT 34.4* 30.7* 31.3* 33.6*  MCV 85.8 86.2 85.8 85.5  PLT 232 195 204 238   Cardiac Enzymes: No results for  input(s): CKTOTAL, CKMB, CKMBINDEX, TROPONINI in the last 168 hours. BNP: BNP (last 3 results)  Recent Labs  12/06/14 1849  BNP 30.8    ProBNP (last 3 results) No results for input(s): PROBNP in the last 8760 hours.  CBG: No results for input(s): GLUCAP in  the last 168 hours.  Time coordinating discharge: 35 minutes  Signed:  Caroline Longie  [facility] 03/09/2015, 10:53 AM

## 2015-03-09 NOTE — Progress Notes (Signed)
Report called to [facility] at [facility].

## 2015-03-09 NOTE — Clinical Social Work Note (Signed)
Clinical Social Work Assessment  Patient Details  Name: Lori [name] MRN: [id number] Date of Birth: August 11, 1926  Date of referral:  03/09/15               Reason for consult:  Facility Placement                Permission sought to share information with:  Facility Art therapist granted to share information::  Yes, Verbal Permission Granted  Name::     Lori [name]  Agency::  [facility]  Relationship::  Daughters  Contact Information:     Housing/Transportation Living arrangements for the past 2 months:  [place] of Information:  Patient, Adult Children Patient Interpreter Needed:  None Criminal Activity/Legal Involvement Pertinent to Current Situation/Hospitalization:  No - Comment as needed Significant Relationships:  Adult Children Lives with:  Facility Resident Do you feel safe going back to the place where you live?  Yes Need for family participation in patient care:  Yes (Comment)  Care giving concerns:  Patient from [facility] and plans to return to skilled level of care. No concerns noted.   Social Worker assessment / plan:  CSW received referral from MD reporting that patient is from SNF and ready to DC today. CSW met with patient at bedside. Patient reports that she is from [facility] and has lived there for 4 years. Patient's husband lives in house on campus and she moved to rehab for additional care. Patient reports that she is pleased to be DC from the hospital but is worried that family is not aware of plans. Patient granted CSW permission to contact dtr. CSW and patient called dtr together from room. Dtr reports she was aware of DC plans and will bring clothes to the hospital for patient and follow up with her MD appointment.   CSW completed FL2 and faxed DC summary to [facility]. CSW spoke with Lori [name] at [facility] who confirms DC summary was received and they will provide transportation to orthopedic appt and back to SNF.   DC  packet prepared with DC summary, FL2, hard scripts, DNR, and H&P included. RN aware of DC plans.  Employment status:  Retired Nurse, adult PT Recommendations:  Universal / Referral to community resources:  Acute Rehab  Patient/Family's Response to care:  Patient alert and oriented and engaged in assessment. Patient reports that she has enjoyed her stay at the hospital but has missed company from her retirement center. Patient anxious about plans to DC and wants to ensure that all parties are aware and that dtr will have enough time to bring her belongings to the hospital. Patient reports she is strong and independent and feels like a burden when she has to ask her family for assistance. Patient reports her dtr is very involved and she is grateful for her assistance.   Patient/Family's Understanding of and Emotional Response to Diagnosis, Current Treatment, and Prognosis:  Patient anxious about DC from hospital and follow up appointment with Orthopedic Dr. Patient believes she can make a full recovery if she follows MD orders and reports that SNF will assist with rehab to make plans possible.  Emotional Assessment Appearance:  Appears stated age Attitude/Demeanor/Rapport:  Other (anxious re: DC plans) Affect (typically observed):  Overwhelmed Orientation:  Oriented to Self, Oriented to Place, Oriented to  Time, Oriented to Situation Alcohol / Substance use:  Never Used Psych involvement (Current and /or in the community):  No (  Comment)  Discharge Needs  Concerns to be addressed:  Discharge Planning Concerns Readmission within the last 30 days:  No Current discharge risk:  None Barriers to Discharge:  No Barriers Identified   Lori [name], [place] 03/09/2015, 1:13 PM  [phone]

## 2015-03-10 ENCOUNTER — Non-Acute Institutional Stay (SKILLED_NURSING_FACILITY): Payer: Medicare Other | Admitting: Internal Medicine

## 2015-03-10 DIAGNOSIS — I482 Chronic atrial fibrillation, unspecified: Secondary | ICD-10-CM

## 2015-03-10 DIAGNOSIS — R110 Nausea: Secondary | ICD-10-CM

## 2015-03-10 DIAGNOSIS — K2971 Gastritis, unspecified, with bleeding: Secondary | ICD-10-CM

## 2015-03-10 DIAGNOSIS — A0472 Enterocolitis due to Clostridium difficile, not specified as recurrent: Secondary | ICD-10-CM

## 2015-03-10 DIAGNOSIS — A419 Sepsis, unspecified organism: Secondary | ICD-10-CM

## 2015-03-10 DIAGNOSIS — F418 Other specified anxiety disorders: Secondary | ICD-10-CM | POA: Diagnosis not present

## 2015-03-10 DIAGNOSIS — K920 Hematemesis: Secondary | ICD-10-CM | POA: Diagnosis not present

## 2015-03-10 DIAGNOSIS — J189 Pneumonia, unspecified organism: Secondary | ICD-10-CM

## 2015-03-10 DIAGNOSIS — A047 Enterocolitis due to Clostridium difficile: Secondary | ICD-10-CM | POA: Diagnosis not present

## 2015-03-10 DIAGNOSIS — K589 Irritable bowel syndrome without diarrhea: Secondary | ICD-10-CM
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
## 2015-03-11 LAB — CULTURE, BLOOD (ROUTINE X 2)
CULTURE: NO GROWTH
CULTURE: NO GROWTH

## 2015-03-19 ENCOUNTER — Encounter: Payer: Self-pay | Admitting: Adult Health

## 2015-03-19 ENCOUNTER — Non-Acute Institutional Stay (SKILLED_NURSING_FACILITY): Payer: Medicare Other | Admitting: Adult Health

## 2015-03-19 DIAGNOSIS — I519 Heart disease, unspecified: Secondary | ICD-10-CM

## 2015-03-19 DIAGNOSIS — I10 Essential (primary) hypertension: Secondary | ICD-10-CM | POA: Diagnosis not present

## 2015-03-19 DIAGNOSIS — K589 Irritable bowel syndrome without diarrhea: Secondary | ICD-10-CM | POA: Diagnosis not present

## 2015-03-19 DIAGNOSIS — I480 Paroxysmal atrial fibrillation: Secondary | ICD-10-CM | POA: Diagnosis not present

## 2015-03-19 DIAGNOSIS — E785 Hyperlipidemia, unspecified: Secondary | ICD-10-CM

## 2015-03-19 DIAGNOSIS — A047 Enterocolitis due to Clostridium difficile: Secondary | ICD-10-CM

## 2015-03-19 DIAGNOSIS — J449 Chronic obstructive pulmonary disease, unspecified: Secondary | ICD-10-CM

## 2015-03-19 DIAGNOSIS — S82891G Other fracture of right lower leg, subsequent encounter for closed fracture with delayed healing: Secondary | ICD-10-CM

## 2015-03-19 DIAGNOSIS — J189 Pneumonia, unspecified organism: Secondary | ICD-10-CM

## 2015-03-19 DIAGNOSIS — A0472 Enterocolitis due to Clostridium difficile, not specified as recurrent: Secondary | ICD-10-CM
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
## 2015-03-20 ENCOUNTER — Encounter: Payer: Self-pay | Admitting: Adult Health

## 2015-03-20 ENCOUNTER — Non-Acute Institutional Stay (SKILLED_NURSING_FACILITY): Payer: Medicare Other | Admitting: Adult Health

## 2015-03-20 DIAGNOSIS — S0501XA Injury of conjunctiva and corneal abrasion without foreign body, right eye, initial encounter: Secondary | ICD-10-CM | POA: Diagnosis not present

## 2015-03-20 NOTE — Progress Notes (Signed)
Patient ID: Lori [name], female   DOB: Nov 08, 1925, 81 y.o.   MRN: [id number]     Patient Care Team: Gayland Curry, DO as PCP - General (Geriatric Medicine) Brand Males, MD as Consulting Physician (Pulmonary Disease) [name], MD as Consulting Physician (Orthopedic Surgery)  Nursing Home Location:  [facility]   Code Status: DNR   N Place of Service: SNF (31)  Chief Complaint  Patient presents with  . Acute Visit    right eye erythema    HPI:  81 y.o. female residing at [facility], skilled care section. I was asked to see her today due to right eye erythema. She apparently was scratching her eye last night and hit her eye with her nails.   The area is not painful, she denies change in vision, itching, or drainage.    Review of Systems:  Review of Systems  Constitutional: Negative for fever and chills.  Eyes: Positive for redness. Negative for photophobia, pain, discharge, itching and visual disturbance.    Medications: Patient's Medications  New Prescriptions   No medications on file  Previous Medications   ACETAMINOPHEN (TYLENOL) 325 MG TABLET    Take 2 tablets (650 mg total) by mouth every 6 (six) hours as needed for mild pain (or Fever >/= 101).   ALPRAZOLAM (XANAX) 0.25 MG TABLET    Take 1 tablet (0.25 mg total) by mouth 2 (two) times daily as needed for anxiety. Dr Aundra Dubin will not be able to provide additional refills   BIMATOPROST (LUMIGAN) 0.01 % SOLN    Place 1 drop into both eyes daily.   DICYCLOMINE (BENTYL) 10 MG CAPSULE    Take 1 capsule (10 mg total) by mouth 3 (three) times daily as needed. IBS   DILTIAZEM (CARDIZEM CD) 120 MG 24 HR CAPSULE    Take 120 mg by mouth daily.   ELUXADOLINE (VIBERZI) 100 MG TABS    Take 100 mg by mouth 2 (two) times daily with a meal.   FEEDING SUPPLEMENT, RESOURCE BREEZE, (RESOURCE BREEZE) LIQD    Take 1 Container by mouth 3 (three) times daily between meals.   FLUTICASONE  FUROATE-VILANTEROL (BREO ELLIPTA) 200-25 MCG/INH AEPB    Inhale 1 puff into the lungs daily.   LEVALBUTEROL (XOPENEX HFA) 45 MCG/ACT INHALER    Inhale 1-2 puffs into the lungs every 4 (four) hours as needed for wheezing.   MAGNESIUM OXIDE (MAGOX 400 PO)    Take 1 tablet by mouth daily as needed (leg cramps).   NYSTATIN CREAM (MYCOSTATIN)    Apply topically 2 (two) times daily. Apply to vulvar area   PRAVASTATIN (PRAVACHOL) 20 MG TABLET    Take 1 tablet (20 mg total) by mouth every evening.   PROMETHAZINE (PHENERGAN) 12.5 MG TABLET    Take 12.5 mg by mouth every 6 (six) hours as needed for nausea or vomiting.   RIVAROXABAN (XARELTO) 20 MG TABS TABLET    Take 1 tablet (20 mg total) by mouth daily with supper.   SACCHAROMYCES BOULARDII (FLORASTOR) 250 MG CAPSULE    Take 1 capsule (250 mg total) by mouth 2 (two) times daily.   TIOTROPIUM (SPIRIVA) 18 MCG INHALATION CAPSULE    Place 18 mcg into inhaler and inhale daily.   TRAMADOL (ULTRAM) 50 MG TABLET    Take one tablet by mouth every 6 hours as needed for pain   VANCOMYCIN (VANCOCIN) 50 MG/ML ORAL SOLUTION    Take 10 mLs (500 mg total) by mouth  every 6 (six) hours.   ZOLPIDEM (AMBIEN) 5 MG TABLET    TAKE ONE TABLET AT BEDTIME AS NEEDED FOR SLEEP  Modified Medications   No medications on file  Discontinued Medications   No medications on file     Physical Exam:  There were no vitals filed for this visit.  Physical Exam  Constitutional: No distress.  Eyes: Pupils are equal, round, and reactive to light. Right eye exhibits no chemosis (;                                                                                                                                                                                                                                                               ) and no discharge. Left eye exhibits no chemosis and no discharge. Right conjunctiva is injected. Right conjunctiva has a hemorrhage. Left conjunctiva has no  hemorrhage.  Skin: She is not diaphoretic.    Labs reviewed/Significant Diagnostic Results:  Basic Metabolic Panel:  Recent Labs  12/06/14 1849  12/12/14 0533  03/07/15 0516 03/08/15 0526 03/09/15 0538  NA 137  < > 135  < > 133* 135 135  K 4.2  < > 4.2  < > 3.0* 4.0 4.2  CL 103  < > 106  < > 105 106 102  CO2 27  < > 26  < > 23 23 27   GLUCOSE 90  < > 167*  < > 121* 96 92  BUN 20  < > 20  < > 12 7 6   CREATININE 0.70  < > 0.69  < > 0.48 0.47 0.58  CALCIUM 9.4  < > 8.3*  < > 7.8* 8.4* 8.8*  MG 2.2  --  1.9  --   --   --   --   < > = values in this interval not displayed. Liver Function Tests:  Recent Labs  11/22/14 1550 12/11/14 1615 03/06/15 1125  AST 23 25 23   ALT 20 19 25   ALKPHOS 86 81 88  BILITOT 0.6 0.5 1.0  PROT 6.3 6.1 5.5*  ALBUMIN 4.0 3.7 2.6*    Recent Labs  03/06/15 1125  LIPASE <10*   No results for input(s): AMMONIA in the last 8760 hours. CBC:  Recent Labs  12/06/14 1849 12/11/14  1615  03/06/15 1125 03/07/15 0516 03/08/15 0526 03/09/15 0538  WBC 6.9 14.4*  < > 13.9* 9.3 5.9 4.9  NEUTROABS 3.9 12.9*  --  13.0*  --   --   --   HGB 14.2 12.9  < > 11.5* 10.2* 10.3* 11.2*  HCT 42.7 38.7  < > 34.4* 30.7* 31.3* 33.6*  MCV 89.1 88.2  --  85.8 86.2 85.8 85.5  PLT 303 273  < > 232 195 204 238  < > = values in this interval not displayed. CBG: No results for input(s): GLUCAP in the last 8760 hours. TSH:  Recent Labs  06/05/14 0954  TSH 1.38   A1C: Lab Results  Component Value Date   HGBA1C * 10/04/2010    5.7 (NOTE)                                                                       According to the ADA Clinical Practice Recommendations for 2011, when HbA1c is used as a screening test:   >=6.5%   Diagnostic of Diabetes Mellitus           (if abnormal result  is confirmed)  5.7-6.4%   Increased risk of developing Diabetes Mellitus  References:Diagnosis and Classification of Diabetes Mellitus,Diabetes [id number],85(YIFOY 1):S62-S69 and  Standards of Medical Care in         Diabetes - 2011,Diabetes [id number]  (Suppl 1):S11-S61.   Lipid Panel:  Recent Labs  06/05/14 0954  CHOL 247*  HDL 75.60  LDLCALC 156*  TRIG 78.0  CHOLHDL 3       Assessment/Plan  1) Corneal abrasion Due to scratching her eye with long fingernails.   Erythromycin 0.5% 0.5 inch ribbon 4 x day for 3 days We do not have the ability to use a slit lamp, however, she has no change in vision, foreign body sensation, or other complaints. The antibiotic is prophylactic. She should f/u if she develops any further issues.    Cindi Carbon, ANP [facility] [phone]

## 2015-03-24 ENCOUNTER — Encounter: Payer: Self-pay | Admitting: Internal Medicine

## 2015-03-24 ENCOUNTER — Ambulatory Visit (HOSPITAL_COMMUNITY)
Admission: RE | Admit: 2015-03-24 | Discharge: 2015-03-24 | Disposition: A | Discharge status: Home / Self Care | Payer: Medicare Other | Source: Ambulatory Visit | Attending: Cardiovascular Disease | Admitting: Cardiovascular Disease

## 2015-03-24 ENCOUNTER — Ambulatory Visit (INDEPENDENT_AMBULATORY_CARE_PROVIDER_SITE_OTHER): Payer: Medicare Other | Admitting: Internal Medicine

## 2015-03-24 ENCOUNTER — Other Ambulatory Visit: Payer: Self-pay | Admitting: Internal Medicine

## 2015-03-24 VITALS — BP 124/74 | HR 82 | Temp 98.2°F | Ht 64.0 in | Wt 135.5 lb

## 2015-03-24 DIAGNOSIS — I10 Essential (primary) hypertension: Secondary | ICD-10-CM

## 2015-03-24 DIAGNOSIS — A047 Enterocolitis due to Clostridium difficile: Secondary | ICD-10-CM | POA: Diagnosis not present

## 2015-03-24 DIAGNOSIS — A0472 Enterocolitis due to Clostridium difficile, not specified as recurrent: Secondary | ICD-10-CM

## 2015-03-24 DIAGNOSIS — S82891G Other fracture of right lower leg, subsequent encounter for closed fracture with delayed healing: Secondary | ICD-10-CM

## 2015-03-24 DIAGNOSIS — R609 Edema, unspecified: Secondary | ICD-10-CM

## 2015-03-24 DIAGNOSIS — S82891A Other fracture of right lower leg, initial encounter for closed fracture: Secondary | ICD-10-CM | POA: Diagnosis not present

## 2015-03-24 DIAGNOSIS — E785 Hyperlipidemia, unspecified: Secondary | ICD-10-CM

## 2015-03-24 NOTE — Progress Notes (Signed)
Pre visit review using our clinic review tool, if applicable. No additional management support is needed unless otherwise documented below in the visit note. 
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
## 2015-03-24 NOTE — Patient Instructions (Signed)
We do not need to take any blood work today. We will have you go to the vascular center for the ultrasound.   Keep working on recovery but you look good today. If I can be any help please feel free to call and let us know.

## 2015-03-25 ENCOUNTER — Inpatient Hospital Stay: Admission: RE | Admit: 2015-03-25 | Payer: Self-pay | Source: Ambulatory Visit

## 2015-03-25 NOTE — Assessment & Plan Note (Signed)
Getting bone density to check for reason for fracture.

## 2015-03-25 NOTE — Assessment & Plan Note (Signed)
She did have 2 bouts with the diarrhea although is diarrhea free at this time. Has follow up scheduled with ID later this month. Vancomycin done as of 03/20/15.

## 2015-03-25 NOTE — Assessment & Plan Note (Signed)
BP at goal with diltiazem only. Will continue to monitor. Reviewed recent BMP and no changes indicated.

## 2015-03-25 NOTE — Assessment & Plan Note (Signed)
On pravastatin, no indication for change at this time.

## 2015-03-25 NOTE — Assessment & Plan Note (Addendum)
Able to bear weight as tolerated. Still having some mild pain and uses pain medication rarely. Still with swelling and she has scheduled an ultrasound of the area for later today. Will follow results. Is on a blood thinner making blood clot less likely.

## 2015-03-26 ENCOUNTER — Telehealth: Payer: Self-pay | Admitting: *Deleted

## 2015-03-26 NOTE — Telephone Encounter (Signed)
Delray Alt, RN [facility]mings) called regarding patient PT orders, she stated that the patient will be going out of town and will be back on 04/22/2015. Will it be all right to start her when she returns back in town? I returned the call to Ms. Coven to let her know it will be ok per Dr. Mariea Clonts.

## 2015-03-27 ENCOUNTER — Other Ambulatory Visit: Payer: Self-pay | Admitting: Geriatric Medicine

## 2015-03-27 ENCOUNTER — Other Ambulatory Visit: Payer: Self-pay | Admitting: Internal Medicine

## 2015-03-27 ENCOUNTER — Ambulatory Visit
Admission: RE | Admit: 2015-03-27 | Discharge: 2015-03-27 | Disposition: A | Discharge status: Home / Self Care | Payer: Medicare Other | Source: Ambulatory Visit | Attending: Internal Medicine | Admitting: Internal Medicine

## 2015-03-27 DIAGNOSIS — J189 Pneumonia, unspecified organism: Secondary | ICD-10-CM

## 2015-03-31 ENCOUNTER — Encounter: Payer: Self-pay | Admitting: Internal Medicine

## 2015-04-02 ENCOUNTER — Ambulatory Visit: Payer: Medicare Other | Admitting: Cardiology

## 2015-04-08 ENCOUNTER — Inpatient Hospital Stay: Payer: Self-pay | Admitting: Infectious Disease

## 2015-04-08 ENCOUNTER — Telehealth: Payer: Self-pay | Admitting: *Deleted

## 2015-04-08 NOTE — Telephone Encounter (Signed)
Is she having any more episodes of C diff and is she on antibiotics for CDI? If not she can reschedule as needed

## 2015-04-08 NOTE — Telephone Encounter (Signed)
Nurse at [facility] has called to cancel patient's ID follow up. She was scheduled for evaluation after multiple episodes of C. Diff.  Patient not able to come 7/20 or 7/21 as she is currently out of town. She will return early August.  Please advise when/if she should be rescheduled.  Patient has been hospitalized multiple times and per Rehab RN is slightly confused; she will need to know why she is coming to this appointment. Landis Gandy, RN

## 2015-04-08 NOTE — Telephone Encounter (Signed)
Ok I will wait on hearing from her

## 2015-04-08 NOTE — Telephone Encounter (Signed)
She is out of town, and per the RN who discharged her from rehab, did not have symptoms.

## 2015-04-09 ENCOUNTER — Inpatient Hospital Stay: Payer: Self-pay | Admitting: Infectious Disease

## 2015-04-09 NOTE — Telephone Encounter (Signed)
Thanks Michelle

## 2015-04-09 NOTE — Telephone Encounter (Signed)
I left her a message (home number, answering machine) asking her to call if she still had symptoms. Thanks!

## 2015-04-21 ENCOUNTER — Ambulatory Visit: Payer: Medicare Other | Admitting: Physician Assistant

## 2015-04-22 ENCOUNTER — Encounter: Payer: Medicare Other | Admitting: Internal Medicine

## 2015-05-29 ENCOUNTER — Emergency Department (HOSPITAL_COMMUNITY): Payer: Medicare Other

## 2015-05-29 ENCOUNTER — Encounter (HOSPITAL_COMMUNITY): Payer: Self-pay | Admitting: Nurse Practitioner

## 2015-05-29 ENCOUNTER — Observation Stay (HOSPITAL_COMMUNITY)
Admission: EM | Admit: 2015-05-29 | Discharge: 2015-05-30 | Disposition: A | Discharge status: Home / Self Care | Payer: Medicare Other | Attending: Internal Medicine | Admitting: Internal Medicine

## 2015-05-29 ENCOUNTER — Telehealth: Payer: Self-pay | Admitting: *Deleted

## 2015-05-29 ENCOUNTER — Telehealth: Payer: Self-pay | Admitting: Cardiology

## 2015-05-29 DIAGNOSIS — I519 Heart disease, unspecified: Secondary | ICD-10-CM | POA: Insufficient documentation

## 2015-05-29 DIAGNOSIS — J45909 Unspecified asthma, uncomplicated: Secondary | ICD-10-CM | POA: Diagnosis not present

## 2015-05-29 DIAGNOSIS — I471 Supraventricular tachycardia: Secondary | ICD-10-CM

## 2015-05-29 DIAGNOSIS — R0602 Shortness of breath: Secondary | ICD-10-CM | POA: Diagnosis not present

## 2015-05-29 DIAGNOSIS — Z87891 Personal history of nicotine dependence: Secondary | ICD-10-CM | POA: Diagnosis not present

## 2015-05-29 DIAGNOSIS — Z872 Personal history of diseases of the skin and subcutaneous tissue: Secondary | ICD-10-CM | POA: Insufficient documentation

## 2015-05-29 DIAGNOSIS — Z79899 Other long term (current) drug therapy: Secondary | ICD-10-CM | POA: Insufficient documentation

## 2015-05-29 DIAGNOSIS — I4891 Unspecified atrial fibrillation: Principal | ICD-10-CM | POA: Diagnosis present

## 2015-05-29 DIAGNOSIS — I10 Essential (primary) hypertension: Secondary | ICD-10-CM | POA: Diagnosis not present

## 2015-05-29 DIAGNOSIS — I2510 Atherosclerotic heart disease of native coronary artery without angina pectoris: Secondary | ICD-10-CM | POA: Diagnosis not present

## 2015-05-29 DIAGNOSIS — J449 Chronic obstructive pulmonary disease, unspecified: Secondary | ICD-10-CM | POA: Insufficient documentation

## 2015-05-29 DIAGNOSIS — G4700 Insomnia, unspecified: Secondary | ICD-10-CM | POA: Insufficient documentation

## 2015-05-29 DIAGNOSIS — R55 Syncope and collapse: Secondary | ICD-10-CM | POA: Diagnosis not present

## 2015-05-29 DIAGNOSIS — Z8701 Personal history of pneumonia (recurrent): Secondary | ICD-10-CM | POA: Insufficient documentation

## 2015-05-29 DIAGNOSIS — F419 Anxiety disorder, unspecified: Secondary | ICD-10-CM | POA: Diagnosis not present

## 2015-05-29 DIAGNOSIS — Z7901 Long term (current) use of anticoagulants: Secondary | ICD-10-CM | POA: Diagnosis not present

## 2015-05-29 DIAGNOSIS — K589 Irritable bowel syndrome without diarrhea: Secondary | ICD-10-CM | POA: Insufficient documentation

## 2015-05-29 HISTORY — DX: Atherosclerotic heart disease of native coronary artery without angina pectoris: I25.10

## 2015-05-29 HISTORY — DX: Cardiac arrhythmia, unspecified: I49.9

## 2015-05-29 LAB — MAGNESIUM: Magnesium: 2.1 mg/dL (ref 1.7–2.4)

## 2015-05-29 LAB — I-STAT TROPONIN, ED: TROPONIN I, POC: 0 ng/mL (ref 0.00–0.08)

## 2015-05-29 LAB — BASIC METABOLIC PANEL
Anion gap: 6 (ref 5–15)
BUN: 26 mg/dL — AB (ref 6–20)
CO2: 24 mmol/L (ref 22–32)
CREATININE: 0.61 mg/dL (ref 0.44–1.00)
Calcium: 9.1 mg/dL (ref 8.9–10.3)
Chloride: 106 mmol/L (ref 101–111)
GFR calc Af Amer: >60 mL/min (ref >60)
Glucose, Bld: 108 mg/dL — ABNORMAL HIGH (ref 65–99)
POTASSIUM: 3.8 mmol/L (ref 3.5–5.1)
Sodium: 136 mmol/L (ref 135–145)

## 2015-05-29 LAB — CBC
HCT: 41.5 % (ref 36.0–46.0)
Hemoglobin: 13.8 g/dL (ref 12.0–15.0)
MCH: 28.7 pg (ref 26.0–34.0)
MCHC: 33.3 g/dL (ref 30.0–36.0)
MCV: 86.3 fL (ref 78.0–100.0)
PLATELETS: 383 10*3/uL (ref 150–400)
RBC: 4.81 MIL/uL (ref 3.87–5.11)
RDW: 17 % — AB (ref 11.5–15.5)
WBC: 6.6 10*3/uL (ref 4.0–10.5)

## 2015-05-29 MED ORDER — TIOTROPIUM BROMIDE MONOHYDRATE 18 MCG IN CAPS
18.0000 ug | ORAL_CAPSULE | Freq: Every day | RESPIRATORY_TRACT | Status: DC
  Administered 2015-05-30: 18 ug via RESPIRATORY_TRACT
  Filled 2015-05-29: qty 5

## 2015-05-29 MED ORDER — DILTIAZEM LOAD VIA INFUSION
10.0000 mg | Freq: Once | INTRAVENOUS | Status: AC
  Administered 2015-05-29: 10 mg via INTRAVENOUS
  Filled 2015-05-29: qty 10

## 2015-05-29 MED ORDER — FLUTICASONE FUROATE-VILANTEROL 200-25 MCG/INH IN AEPB: 1.0000 | INHALATION_SPRAY | Freq: Every day | RESPIRATORY_TRACT | Status: DC

## 2015-05-29 MED ORDER — DICYCLOMINE HCL 10 MG PO CAPS: 10.0000 mg | ORAL_CAPSULE | Freq: Every day | ORAL | Status: DC | PRN

## 2015-05-29 MED ORDER — RIVAROXABAN 15 MG PO TABS
15.0000 mg | ORAL_TABLET | Freq: Every day | ORAL | Status: DC
  Administered 2015-05-29: 15 mg via ORAL
  Filled 2015-05-29: qty 1

## 2015-05-29 MED ORDER — LATANOPROST 0.005 % OP SOLN
1.0000 [drp] | Freq: Every day | OPHTHALMIC | Status: DC
  Administered 2015-05-29: 1 [drp] via OPHTHALMIC
  Filled 2015-05-29: qty 2.5

## 2015-05-29 MED ORDER — LEVALBUTEROL HCL 1.25 MG/0.5ML IN NEBU: 1.2500 mg | INHALATION_SOLUTION | RESPIRATORY_TRACT | Status: DC | PRN

## 2015-05-29 MED ORDER — DILTIAZEM HCL 100 MG IV SOLR
5.0000 mg/h | INTRAVENOUS | Status: DC
  Administered 2015-05-29 – 2015-05-30 (×2): 5 mg/h via INTRAVENOUS
  Filled 2015-05-29 (×2): qty 100

## 2015-05-29 MED ORDER — LEVALBUTEROL TARTRATE 45 MCG/ACT IN AERO: 1.0000 | INHALATION_SPRAY | RESPIRATORY_TRACT | Status: DC | PRN

## 2015-05-29 MED ORDER — ZOLPIDEM TARTRATE 5 MG PO TABS
5.0000 mg | ORAL_TABLET | Freq: Every evening | ORAL | Status: DC | PRN
  Administered 2015-05-29: 5 mg via ORAL
  Filled 2015-05-29: qty 1

## 2015-05-29 MED ORDER — ALPRAZOLAM 0.25 MG PO TABS: 0.2500 mg | ORAL_TABLET | Freq: Two times a day (BID) | ORAL | Status: DC | PRN

## 2015-05-29 MED ORDER — ELUXADOLINE 100 MG PO TABS: 100.0000 mg | ORAL_TABLET | Freq: Two times a day (BID) | ORAL | Status: DC

## 2015-05-29 MED ORDER — PRAVASTATIN SODIUM 20 MG PO TABS
20.0000 mg | ORAL_TABLET | Freq: Every evening | ORAL | Status: DC
  Administered 2015-05-29: 20 mg via ORAL
  Filled 2015-05-29: qty 1

## 2015-05-29 MED ORDER — SACCHAROMYCES BOULARDII 250 MG PO CAPS
250.0000 mg | ORAL_CAPSULE | Freq: Two times a day (BID) | ORAL | Status: DC
  Administered 2015-05-30: 250 mg via ORAL
  Filled 2015-05-29: qty 1

## 2015-05-29 NOTE — Telephone Encounter (Signed)
She never established in the clinic at [facility].  She was seen in rehab and then discharged back to her independent home.  If she is going to come to [place] clinic instead of [place], she needs to establish with [place].  If not, she should return to Dr. Doug Sou
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
## 2015-05-29 NOTE — Telephone Encounter (Signed)
Patient called and left message on voicemail that she didn't feel well and wanted to speak with Dr. Mariea Clonts. Patent kept repeating she needed to speak with Dr. Mariea Clonts. I tried calling patient back and she was not home. A man said that she would be gone all day.

## 2015-05-29 NOTE — ED Notes (Signed)
Pt on bedside toilet.

## 2015-05-29 NOTE — ED Provider Notes (Signed)
CSN: [id number]     Arrival date & time 05/29/15  1341 History   First MD Initiated Contact with Patient 05/29/15 1348     Chief Complaint  Patient presents with  . Near Syncope     (Consider location/radiation/quality/duration/timing/severity/associated sxs/prior Treatment) Patient is a 81 y.o. female presenting with near-syncope. The history is provided by the patient.  Near Syncope This is a new problem. The current episode started 6 to 12 hours ago. The problem occurs constantly. The problem has been resolved. Associated symptoms include shortness of breath. Pertinent negatives include no chest pain. Nothing aggravates the symptoms. Nothing relieves the symptoms. She has tried nothing for the symptoms. The treatment provided no relief.    Past Medical History  Diagnosis Date  . COPD (chronic obstructive pulmonary disease)   . Asthma   . ALLERGIC RHINITIS   . Hypertension   . Pleural effusion, left   . Colles' fracture of left radius   . Anxiety   . Back pain of thoracolumbar region     right  . Pruritus   . Diastolic dysfunction   . Pulmonary nodule   . IBS (irritable bowel syndrome)   . Bronchiectasis   . Insomnia   . History of pneumonia   . Diverticulosis   . Hx of colonic polyp   . Multiple rib fractures 10/2008    bilateral  . Hx of cardiovascular stress test     a. ETT-MV 1/14: Exercised 4:16, no ECG changes, poor ex tol, ant defect c/w soft tissue atten, no ischemia, EF 75%  . Atrial fibrillation   . Coronary artery disease   . Dysrhythmia    Past Surgical History  Procedure Laterality Date  . Tonsillectomy    . Appendectomy    . Cholecystectomy    . Polypectomy    . Bladder suspension  2005    A-P  . Rotator cuff repair Right 2006  . Orif wrist fracture Left 2010      Dr. Burney Gauze.  . Total knee arthroplasty Right 08/2010  . Tonsillectomy    . Colonoscopy  04/06/2012    Procedure: COLONOSCOPY;  Surgeon: Jerene Bears, MD;  Location: [facility];   Service: Gastroenterology;  Laterality: N/A;  . External fixation leg Right 12/12/2014    Procedure: ADJUSTMENT OF EXTERNAL FIXATION  RIGHT ANKLE;  Surgeon: Rod Can, MD;  Location: [place];  Service: Orthopedics;  Laterality: Right;  . Ankle closed reduction Right 12/11/2014    Procedure: CLOSED REDUCTION ANKLE;  Surgeon: Rod Can, MD;  Location: [place];  Service: Orthopedics;  Laterality: Right;  . External fixation leg Right 12/11/2014    Procedure: POSSIBLE EXTERNAL FIXATION RIGHT ANKLE;  Surgeon: Rod Can, MD;  Location: [place];  Service: Orthopedics;  Laterality: Right;  . Hardware removal Right 12/25/2014    Procedure: RIGHT ANKLE REMOVE OF DEEP IMPLANT ANE EXTERNAL FIXATOR ;  Surgeon: [name], MD;  Location: [place];  Service: Orthopedics;  Laterality: Right;  . Orif ankle fracture Right 12/25/2014    Procedure: OPEN REDUCTION INTERNAL FIXATION (ORIF)TRIMALLEOLAR  ANKLE FRACTURE;  Surgeon: [name], MD;  Location: [place];  Service: Orthopedics;  Laterality: Right;   Family History  Problem Relation Age of Onset  . Heart disease Mother   . Colon cancer Neg Hx   . Bipolar disorder Daughter   . Rheumatic fever Mother   . Pneumonia Father    Social History  Substance Use Topics  . Smoking status:  Former Smoker -- 0.30 packs/day for 10 years    Types: Cigarettes    Quit date: 09/19/1948  . Smokeless tobacco: Never Used  . Alcohol Use: No     Comment: rarely   OB History    No data available     Review of Systems  Respiratory: Positive for shortness of breath.   Cardiovascular: Positive for near-syncope. Negative for chest pain.  All other systems reviewed and are negative.     Allergies  Albuterol sulfate; Levaquin; Doxycycline; Lactose intolerance (gi); Amoxicillin; Codeine; Penicillins; Sulfa antibiotics; and Sulfonamide derivatives  Home Medications   Prior to Admission medications   Medication Sig Start Date End  Date Taking? Authorizing Provider  acetaminophen (TYLENOL) 325 MG tablet Take 2 tablets (650 mg total) by mouth every 6 (six) hours as needed for mild pain (or Fever >/= 101). 12/13/14  Yes Bonnielee Haff, MD  bimatoprost (LUMIGAN) 0.01 % SOLN Place 1 drop into both eyes daily. 08/06/14  Yes Janith Lima, MD  budesonide-formoterol [facility]) 160-4.5 MCG/ACT inhaler Inhale 2 puffs into the lungs 2 (two) times daily.   Yes Historical Provider, MD  clindamycin (CLEOCIN) 300 MG capsule Take 600 mg by mouth once. For dental propcedures   Yes Historical Provider, MD  dicyclomine (BENTYL) 10 MG capsule Take 1 capsule (10 mg total) by mouth 3 (three) times daily as needed. IBS 11/25/14  Yes Janith Lima, MD  Eluxadoline (VIBERZI) 100 MG TABS Take 100 mg by mouth 2 (two) times daily with a meal. 02/17/15  Yes Tiffany L Reed, DO  Magnesium Oxide (MAGOX 400 PO) Take 1 tablet by mouth daily as needed (leg cramps).   Yes Historical Provider, MD  rivaroxaban (XARELTO) 20 MG TABS tablet Take 1 tablet (20 mg total) by mouth daily with supper. 02/17/15  Yes Tiffany L Reed, DO  diltiazem (CARDIZEM CD) 240 MG 24 hr capsule Take 1 capsule (240 mg total) by mouth daily. 05/30/15   [name], MD  zolpidem (AMBIEN) 5 MG tablet TAKE ONE TABLET AT BEDTIME AS NEEDED FOR SLEEP 05/30/15   [name], MD   BP 133/59 mmHg  Pulse 82  Temp(Src) 98.4 F (36.9 C) (Oral)  Resp 17  Ht 5\' 5"  (1.651 m)  Wt 133 lb 6.1 oz (60.5 kg)  BMI 22.20 kg/m2  SpO2 95% Physical Exam  Constitutional: She is oriented to person, place, and time. She appears well-developed and well-nourished. No distress.  HENT:  Head: Normocephalic.  Eyes: Conjunctivae are normal.  Neck: Neck supple. No tracheal deviation present.  Cardiovascular: An irregularly irregular rhythm present. Tachycardia present.   Intermittent tachycardic episodes  Pulmonary/Chest: Effort normal. No respiratory distress.  Abdominal: Soft. She exhibits no  distension.  Neurological: She is alert and oriented to person, place, and time. She has normal strength. No cranial nerve deficit or sensory deficit. GCS eye subscore is 4. GCS verbal subscore is 5. GCS motor subscore is 6.  Skin: Skin is warm and dry.  Psychiatric: She has a normal mood and affect.    ED Course  Procedures (including critical care time) CRITICAL CARE Performed by: Leo Grosser Total critical care time: 30 Critical care time was exclusive of separately billable procedures and treating other patients. Critical care was necessary to treat or prevent imminent or life-threatening deterioration. Critical care was time spent personally by me on the following activities: development of treatment plan with patient and/or surrogate as well as nursing, discussions with consultants, evaluation of patient's response  to treatment, examination of patient, obtaining history from patient or surrogate, ordering and performing treatments and interventions, ordering and review of laboratory studies, ordering and review of radiographic studies, pulse oximetry and re-evaluation of patient's condition.   Labs Review Labs Reviewed  BASIC METABOLIC PANEL - Abnormal; Notable for the following:    Glucose, Bld 108 (*)    BUN 26 (*)    All other components within normal limits  CBC - Abnormal; Notable for the following:    RDW 17.0 (*)    All other components within normal limits  COMPREHENSIVE METABOLIC PANEL - Abnormal; Notable for the following:    Calcium 8.8 (*)    Total Protein 5.8 (*)    Albumin 3.3 (*)    GFR calc non Af Amer 59 (*)    All other components within normal limits  CBC - Abnormal; Notable for the following:    RDW 16.8 (*)    All other components within normal limits  MAGNESIUM  I-STAT TROPOININ, ED    Imaging Review Dg Chest 2 View  05/29/2015   CLINICAL DATA:  Dizziness and near syncope.  EXAM: CHEST  2 VIEW  COMPARISON:  03/27/2015  FINDINGS: Lungs are clear  without airspace disease or pulmonary edema. Heart and mediastinum are within normal limits. Surgical anchors in the proximal right humerus. Trachea is midline. Mild scoliosis and degenerative changes in the thoracic spine.  IMPRESSION: No active cardiopulmonary disease.   Electronically Signed   By: Markus Daft M.D.   On: 05/29/2015 15:33   I have personally reviewed and evaluated these images and lab results as part of my medical decision-making.   EKG Interpretation   Date/Time:  Friday May 29 2015 13:44:32 EDT Ventricular Rate:  83 PR Interval:    QRS Duration: 96 QT Interval:  384 QTC Calculation: 451 R Axis:   26 Text Interpretation:  Atrial fibrillation Compared to previous tracing now  atrial fibrillation Confirmed by Jaamal Farooqui MD, Quillian Quince ([id number]) on 05/29/2015  2:12:54 PM      MDM   Final diagnoses:  Atrial fibrillation with RVR    81 y.o. female presents after having palpitations and pre-syncope at home. Pt not taking her diltiazem for A-fib rate control because  "I didn't know what it was for". Facility reports Pt with sustained rate in 130s, EMS found her to be in rate of 180, here Pt varying between 90 and 130, asymptomatic. High risk demographic, started on cardizem infusion. Will plan for observation stay for rate control transitioning dilt from IV to PO. Hospitalist was consulted for admission and will see the patient in the emergency department. Cardiology consulted by hospitalist request.    Leo Grosser, MD 05/30/15 [phone]

## 2015-05-29 NOTE — Telephone Encounter (Signed)
New message    Nurse calling from [facility]   Pt c/o Shortness Of Breath: STAT if SOB developed within the last 24 hours or pt is noticeably SOB on the phone  1. Are you currently SOB (can you hear that pt is SOB on the phone)? No - nurse speaking on the phone   2. How long have you been experiencing SOB?  [place]   3. Are you SOB when sitting or when up moving around? Moving around   4. Are you currently experiencing any other symptoms? Increase heart rate 138 apical  , dizziness, weakness,  Feels like she going to faint. She does not wants to go to emergency room.  Oxygen level 96.  Vital sign this am 113/82, no fever,

## 2015-05-29 NOTE — ED Notes (Signed)
Per EMS pt from [facility] to be evaluated for dizziness, near syncope, fatigue for the past few days. Pt has a hx of a.fib and takes 1mg  of PO cardizem daily. Pt noted to be in a.fib by EMS with rates 80-188bpm.

## 2015-05-29 NOTE — Telephone Encounter (Signed)
Mliss Sax states patient"s heart rate is still 138, BP 114/82, pt is fatigued, dizzy and short of breath.  Mliss Sax states she has called 911 to transport pt to ED.

## 2015-05-29 NOTE — H&P (Signed)
History and Physical  Lori [name] LPF:[id number] DOB: 18-Dec-1925 DOA: 05/29/2015  Referring physician: EDP PCP: Olga Millers, MD   Chief Complaint: afib/rvr, presyncope  HPI: Lori [name] is a 81 y.o. female sent from [facility] due to found to be in afib/rvr with heart rate into 130's, with complaints of sob/dizziness and weakness. Denies chest pain, no cough ,no fever. She reported not knowing what cardizem is for, so she has not been taking it. She is started on cardizem drip, heart rate better , now in the low 100's. Cardiology consulted by EDP, hospitalist called to admit the patient.   Review of Systems:  Detail per HPI, Review of systems are otherwise negative  Past Medical History  Diagnosis Date  . COPD (chronic obstructive pulmonary disease)   . Asthma   . ALLERGIC RHINITIS   . Hypertension   . Pleural effusion, left   . Colles' fracture of left radius   . Anxiety   . Back pain of thoracolumbar region     right  . Pruritus   . Diastolic dysfunction   . Pulmonary nodule   . IBS (irritable bowel syndrome)   . Bronchiectasis   . Insomnia   . History of pneumonia   . Diverticulosis   . Hx of colonic polyp   . Multiple rib fractures 10/2008    bilateral  . Hx of cardiovascular stress test     a. ETT-MV 1/14: Exercised 4:16, no ECG changes, poor ex tol, ant defect c/w soft tissue atten, no ischemia, EF 75%  . Atrial fibrillation    Past Surgical History  Procedure Laterality Date  . Tonsillectomy    . Appendectomy    . Cholecystectomy    . Polypectomy    . Bladder suspension  2005    A-P  . Rotator cuff repair Right 2006  . Orif wrist fracture Left 2010      Dr. Burney Gauze.  . Total knee arthroplasty Right 08/2010  . Tonsillectomy    . Colonoscopy  04/06/2012    Procedure: COLONOSCOPY;  Surgeon: Jerene Bears, MD;  Location: [facility];  Service: Gastroenterology;  Laterality: N/A;  . External fixation leg Right 12/12/2014    Procedure: ADJUSTMENT OF  EXTERNAL FIXATION  RIGHT ANKLE;  Surgeon: Rod Can, MD;  Location: [place];  Service: Orthopedics;  Laterality: Right;  . Ankle closed reduction Right 12/11/2014    Procedure: CLOSED REDUCTION ANKLE;  Surgeon: Rod Can, MD;  Location: [place];  Service: Orthopedics;  Laterality: Right;  . External fixation leg Right 12/11/2014    Procedure: POSSIBLE EXTERNAL FIXATION RIGHT ANKLE;  Surgeon: Rod Can, MD;  Location: [place];  Service: Orthopedics;  Laterality: Right;  . Hardware removal Right 12/25/2014    Procedure: RIGHT ANKLE REMOVE OF DEEP IMPLANT ANE EXTERNAL FIXATOR ;  Surgeon: [name], MD;  Location: [place];  Service: Orthopedics;  Laterality: Right;  . Orif ankle fracture Right 12/25/2014    Procedure: OPEN REDUCTION INTERNAL FIXATION (ORIF)TRIMALLEOLAR  ANKLE FRACTURE;  Surgeon: [name], MD;  Location: [place];  Service: Orthopedics;  Laterality: Right;   Social History:  reports that she quit smoking about 66 years ago. Her smoking use included Cigarettes. She has a 3 pack-year smoking history. She has never used smokeless tobacco. She reports that she does not drink alcohol or use illicit drugs. Patient lives at assisted living [facility] & is able to participate in activities of daily living independently  Allergies  Allergen Reactions  . Albuterol Sulfate Palpitations  . Levaquin [Levofloxacin In D5w] Other (See Comments)    QT prolongation. Should avoid all flouroquinolones.   . Doxycycline Diarrhea  . Lactose Intolerance (Gi) Other (See Comments)  . Amoxicillin Other (See Comments)    REACTION: unspecified  . Codeine Other (See Comments)    Can take Hydrocodone  . Penicillins Hives, Itching and Rash  . Sulfa Antibiotics Nausea Only  . Sulfonamide Derivatives Nausea Only    Family History  Problem Relation Age of Onset  . Heart disease Mother   . Colon cancer Neg Hx   . Bipolar disorder Daughter   . Rheumatic fever  Mother   . Pneumonia Father       Prior to Admission medications   Medication Sig Start Date End Date Taking? Authorizing Provider  acetaminophen (TYLENOL) 325 MG tablet Take 2 tablets (650 mg total) by mouth every 6 (six) hours as needed for mild pain (or Fever >/= 101). 12/13/14   Bonnielee Haff, MD  ALPRAZolam Duanne Moron) 0.25 MG tablet Take 1 tablet (0.25 mg total) by mouth 2 (two) times daily as needed for anxiety. Dr Aundra Dubin will not be able to provide additional refills 03/09/15   Janece Canterbury, MD  bimatoprost (LUMIGAN) 0.01 % SOLN Place 1 drop into both eyes daily. 08/06/14   Janith Lima, MD  dicyclomine (BENTYL) 10 MG capsule Take 1 capsule (10 mg total) by mouth 3 (three) times daily as needed. IBS Patient taking differently: Take 10 mg by mouth daily as needed for spasms (usualty takes at lunch when needed). IBS 11/25/14   Janith Lima, MD  diltiazem (CARDIZEM CD) 120 MG 24 hr capsule Take 120 mg by mouth daily.    Historical Provider, MD  Eluxadoline (VIBERZI) 100 MG TABS Take 100 mg by mouth 2 (two) times daily with a meal. 02/17/15   Tiffany L Reed, DO  Fluticasone Furoate-Vilanterol (BREO ELLIPTA) 200-25 MCG/INH AEPB Inhale 1 puff into the lungs daily. 09/25/14   Brand Males, MD  levalbuterol [facility] HFA) 45 MCG/ACT inhaler Inhale 1-2 puffs into the lungs every 4 (four) hours as needed for wheezing. 04/24/13   Chesley Mires, MD  Magnesium Oxide (MAGOX 400 PO) Take 1 tablet by mouth daily as needed (leg cramps).    Historical Provider, MD  pravastatin (PRAVACHOL) 20 MG tablet Take 1 tablet (20 mg total) by mouth every evening. 06/23/14   Larey Dresser, MD  rivaroxaban (XARELTO) 20 MG TABS tablet Take 1 tablet (20 mg total) by mouth daily with supper. 02/17/15   Tiffany L Reed, DO  saccharomyces boulardii (FLORASTOR) 250 MG capsule Take 1 capsule (250 mg total) by mouth 2 (two) times daily. 02/17/15   Tiffany L Reed, DO  tiotropium (SPIRIVA) 18 MCG inhalation capsule Place 18 mcg into  inhaler and inhale daily.    Historical Provider, MD  traMADol Veatrice Bourbon) 50 MG tablet Take one tablet by mouth every 6 hours as needed for pain 03/09/15   Janece Canterbury, MD  zolpidem (AMBIEN) 5 MG tablet TAKE ONE TABLET AT BEDTIME AS NEEDED FOR SLEEP 03/09/15   Janece Canterbury, MD    Physical Exam: BP 127/71 mmHg  Pulse 103  Temp(Src) 97.9 F (36.6 C) (Oral)  Resp 19  Ht 5\' 5"  (1.651 m)  Wt 135 lb (61.236 kg)  BMI 22.47 kg/m2  SpO2 94%  General:  NAD Eyes: PERRL ENT: unremarkable Neck: supple, no JVD Cardiovascular: IRRR Respiratory: CTABL Abdomen: soft/ND/ND, positive bowel  sounds Skin: no rash Musculoskeletal:  mild edema right lower extremity (reported related to the ankle fracture and surgery) Psychiatric: calm/cooperative Neurologic: no focal findings            Labs on Admission:  Basic Metabolic Panel:  Recent Labs Lab 05/29/15 1425  NA 136  K 3.8  CL 106  CO2 24  GLUCOSE 108*  BUN 26*  CREATININE 0.61  CALCIUM 9.1  MG 2.1   Liver Function Tests: No results for input(s): AST, ALT, ALKPHOS, BILITOT, PROT, ALBUMIN in the last 168 hours. No results for input(s): LIPASE, AMYLASE in the last 168 hours. No results for input(s): AMMONIA in the last 168 hours. CBC:  Recent Labs Lab 05/29/15 1425  WBC 6.6  HGB 13.8  HCT 41.5  MCV 86.3  PLT 383   Cardiac Enzymes: No results for input(s): CKTOTAL, CKMB, CKMBINDEX, TROPONINI in the last 168 hours.  BNP (last 3 results)  Recent Labs  12/06/14 1849  BNP 30.8    ProBNP (last 3 results) No results for input(s): PROBNP in the last 8760 hours.  CBG: No results for input(s): GLUCAP in the last 168 hours.  Radiological Exams on Admission: Dg Chest 2 View  05/29/2015   CLINICAL DATA:  Dizziness and near syncope.  EXAM: CHEST  2 VIEW  COMPARISON:  03/27/2015  FINDINGS: Lungs are clear without airspace disease or pulmonary edema. Heart and mediastinum are within normal limits. Surgical anchors in the  proximal right humerus. Trachea is midline. Mild scoliosis and degenerative changes in the thoracic spine.  IMPRESSION: No active cardiopulmonary disease.   Electronically Signed   By: Markus Daft M.D.   On: 05/29/2015 15:33    EKG: Independently reviewed. afib/rvr  Assessment/Plan Present on Admission:  . Atrial fibrillation with RVR . A-fib  Afib/rvr: likely from medication noncompliant. Patient reported she does not know what cardizem is for, so she did not take it. Heart rate better on cardizem drip, continue xarelto. Echo ordered. cardiology consulted, observe overnight, likely able to be discharged home if able to successful transition to oral meds.   H/o diastolic chf, clinically euvolemic. Not on diuretic at home.  copd: no wheezing on exam, cxr unremarkable. continue home meds.  DVT prophylaxis: on xarelto  Consultants:   Code Status: full   Family Communication:  Patient   Disposition Plan: admit to med tele obs  Time spent: 49mins  Karyna Bessler MD, PhD [facility] Pager [phone] If 7PM-7AM, please contact night-coverage at [email or web address], password [facility]

## 2015-05-30 DIAGNOSIS — G4700 Insomnia, unspecified: Secondary | ICD-10-CM

## 2015-05-30 DIAGNOSIS — I4891 Unspecified atrial fibrillation: Secondary | ICD-10-CM

## 2015-05-30 DIAGNOSIS — I491 Atrial premature depolarization: Secondary | ICD-10-CM

## 2015-05-30 LAB — COMPREHENSIVE METABOLIC PANEL
ALBUMIN: 3.3 g/dL — AB (ref 3.5–5.0)
ALT: 25 U/L (ref 14–54)
AST: 19 U/L (ref 15–41)
Alkaline Phosphatase: 69 U/L (ref 38–126)
Anion gap: 8 (ref 5–15)
BUN: 19 mg/dL (ref 6–20)
CHLORIDE: 102 mmol/L (ref 101–111)
CO2: 25 mmol/L (ref 22–32)
CREATININE: 0.86 mg/dL (ref 0.44–1.00)
Calcium: 8.8 mg/dL — ABNORMAL LOW (ref 8.9–10.3)
GFR calc Af Amer: >60 mL/min (ref >60)
GFR calc non Af Amer: 59 mL/min — ABNORMAL LOW (ref >60)
GLUCOSE: 95 mg/dL (ref 65–99)
Potassium: 3.5 mmol/L (ref 3.5–5.1)
SODIUM: 135 mmol/L (ref 135–145)
Total Bilirubin: 0.5 mg/dL (ref 0.3–1.2)
Total Protein: 5.8 g/dL — ABNORMAL LOW (ref 6.5–8.1)

## 2015-05-30 LAB — CBC
HCT: 39.9 % (ref 36.0–46.0)
Hemoglobin: 13.5 g/dL (ref 12.0–15.0)
MCH: 29.1 pg (ref 26.0–34.0)
MCHC: 33.8 g/dL (ref 30.0–36.0)
MCV: 86 fL (ref 78.0–100.0)
PLATELETS: 372 10*3/uL (ref 150–400)
RBC: 4.64 MIL/uL (ref 3.87–5.11)
RDW: 16.8 % — AB (ref 11.5–15.5)
WBC: 8.4 10*3/uL (ref 4.0–10.5)

## 2015-05-30 MED ORDER — DILTIAZEM HCL ER COATED BEADS 240 MG PO CP24: 240.0000 mg | ORAL_CAPSULE | Freq: Every day | ORAL | Status: DC

## 2015-05-30 MED ORDER — DILTIAZEM HCL ER COATED BEADS 240 MG PO CP24
240.0000 mg | ORAL_CAPSULE | Freq: Every day | ORAL | Status: DC
  Administered 2015-05-30: 240 mg via ORAL
  Filled 2015-05-30: qty 1

## 2015-05-30 MED ORDER — ZOLPIDEM TARTRATE 5 MG PO TABS: ORAL_TABLET | ORAL | Status: DC

## 2015-05-30 NOTE — Progress Notes (Addendum)
Assumed care from RN @ [phone] Patient HR increased between 130-148 irregular; Cardizem drip rate change to 10cc/hr

## 2015-05-30 NOTE — Progress Notes (Deleted)
Physician Discharge Summary  Lori [name] XYI:[id number] DOB: [birth year] DOA: 05/29/2015  PCP: Olga Millers, MD  Admit date: 05/29/2015 Discharge date: 05/30/2015  Recommendations for Outpatient Follow-up: Patient has follow-up with someone with low-power primary care to establish primary care provider.  Discharge Diagnoses:  Principal Problem: Paroxysmal atrial fibrillation Active Problems:   Insomnia   Discharge Condition: Stable  Diet recommendation: Heart healthy  Filed Weights   05/29/15 1346 05/29/15 1917  Weight: 61.236 kg (135 lb) 60.5 kg (133 lb 6.1 oz)    History of present illness:  81 y.o. female sent from [facility] due to found to be in a fibrillation with rapid ventricular rhythm with heart rate into 130's, with complaints of sob/dizziness and weakness. Denies chest pain, no cough ,no fever. She reported not knowing what cardizem is for, so she has not been taking it. She is started on cardizem drip, heart rate better , now in the low 100's. Cardiology consulted by EDP felt patient was in sinus rhythm with frequent PACs, and MAT, hospitalist called to admit the patient.  Hospital Course:  Patient was started on Cardizem drip. Started back on oral Cardizem CD. Cleared by cardiology for discharge. Patient is feeling better. She requests a refill of Ambien. Reports that her primary care provider retired and she has an appointment with a new PCP upcoming  Procedures:  None  Consultations:  Cardiology  Discharge Exam: Filed Vitals:   05/30/15 0500  BP: 133/59  Pulse: 82  Temp:   Resp: 17    General: a and o Cardiovascular: RRR Respiratory: CTA  Discharge Instructions   Discharge Instructions    Diet - low sodium heart healthy    Complete by:  As directed      Increase activity slowly    Complete by:  As directed           Current Discharge Medication List    CONTINUE these medications which have CHANGED   Details  diltiazem (CARDIZEM CD)  240 MG 24 hr capsule Take 1 capsule (240 mg total) by mouth daily. Qty: 30 capsule, Refills: 1    zolpidem (AMBIEN) 5 MG tablet TAKE ONE TABLET AT BEDTIME AS NEEDED FOR SLEEP Qty: 20 tablet, Refills: 0      CONTINUE these medications which have NOT CHANGED   Details  acetaminophen (TYLENOL) 325 MG tablet Take 2 tablets (650 mg total) by mouth every 6 (six) hours as needed for mild pain (or Fever >/= 101).    bimatoprost (LUMIGAN) 0.01 % SOLN Place 1 drop into both eyes daily. Qty: 5 mL, Refills: 3    budesonide-formoterol (SYMBICORT) 160-4.5 MCG/ACT inhaler Inhale 2 puffs into the lungs 2 (two) times daily.    clindamycin (CLEOCIN) 300 MG capsule Take 600 mg by mouth once. For dental propcedures    dicyclomine (BENTYL) 10 MG capsule Take 1 capsule (10 mg total) by mouth 3 (three) times daily as needed. IBS Qty: 90 capsule, Refills: 3    Eluxadoline (VIBERZI) 100 MG TABS Take 100 mg by mouth 2 (two) times daily with a meal. Qty: 60 tablet, Refills: 3    Magnesium Oxide (MAGOX 400 PO) Take 1 tablet by mouth daily as needed (leg cramps).    rivaroxaban (XARELTO) 20 MG TABS tablet Take 1 tablet (20 mg total) by mouth daily with supper. Qty: 30 tablet, Refills: 3      STOP taking these medications     valsartan (DIOVAN) 80 MG tablet  pravastatin (PRAVACHOL) 20 MG tablet      ALPRAZolam (XANAX) 0.25 MG tablet      Fluticasone Furoate-Vilanterol (BREO ELLIPTA) 200-25 MCG/INH AEPB      saccharomyces boulardii (FLORASTOR) 250 MG capsule      tiotropium (SPIRIVA) 18 MCG inhalation capsule        Allergies  Allergen Reactions  . Albuterol Sulfate Palpitations  . Levaquin [Levofloxacin In D5w] Other (See Comments)    QT prolongation. Should avoid all flouroquinolones.   . Doxycycline Diarrhea  . Lactose Intolerance (Gi) Other (See Comments)  . Amoxicillin Other (See Comments)    REACTION: unspecified  . Codeine Other (See Comments)    Can take Hydrocodone  .  Penicillins Hives, Itching and Rash  . Sulfa Antibiotics Nausea Only  . Sulfonamide Derivatives Nausea Only   Follow-up Information    Follow up with primary care provider.   Why:  2-3 weeks to establish care       The results of significant diagnostics from this hospitalization (including imaging, microbiology, ancillary and laboratory) are listed below for reference.    Significant Diagnostic Studies: Dg Chest 2 View  05/29/2015   CLINICAL DATA:  Dizziness and near syncope.  EXAM: CHEST  2 VIEW  COMPARISON:  03/27/2015  FINDINGS: Lungs are clear without airspace disease or pulmonary edema. Heart and mediastinum are within normal limits. Surgical anchors in the proximal right humerus. Trachea is midline. Mild scoliosis and degenerative changes in the thoracic spine.  IMPRESSION: No active cardiopulmonary disease.   Electronically Signed   By: Markus Daft M.D.   On: 05/29/2015 15:33    Microbiology: No results found for this or any previous visit (from the past 240 hour(s)).   Labs: Basic Metabolic Panel:  Recent Labs Lab 05/29/15 1425 05/30/15 0401  NA 136 135  K 3.8 3.5  CL 106 102  CO2 24 25  GLUCOSE 108* 95  BUN 26* 19  CREATININE 0.61 0.86  CALCIUM 9.1 8.8*  MG 2.1  --    Liver Function Tests:  Recent Labs Lab 05/30/15 0401  AST 19  ALT 25  ALKPHOS 69  BILITOT 0.5  PROT 5.8*  ALBUMIN 3.3*   No results for input(s): LIPASE, AMYLASE in the last 168 hours. No results for input(s): AMMONIA in the last 168 hours. CBC:  Recent Labs Lab 05/29/15 1425 05/30/15 0401  WBC 6.6 8.4  HGB 13.8 13.5  HCT 41.5 39.9  MCV 86.3 86.0  PLT 383 372   Cardiac Enzymes: No results for input(s): CKTOTAL, CKMB, CKMBINDEX, TROPONINI in the last 168 hours. BNP: BNP (last 3 results)  Recent Labs  12/06/14 1849  BNP 30.8    ProBNP (last 3 results) No results for input(s): PROBNP in the last 8760 hours.  CBG: No results for input(s): GLUCAP in the last 168  hours.     SignedDelfina Redwood  [facility] 05/30/2015, 11:27 AM

## 2015-05-30 NOTE — Progress Notes (Signed)
     Received call from RN that patient is currently on a dose pack of prednisone. She is on day 4. Patient does not know why she is on it. She went to the urgent care on Wednesday for dizziness and being disoriented and she reports she was given this. No cough or COPD exacerbation. This was not resumed when she was admitted to [facility]. I told nurse to hold it given no clear indication and will defer to internal medicine whether she continues this medication now or at discharged.    Angelena Form PA-C  [facility]

## 2015-05-30 NOTE — Progress Notes (Signed)
Patient Name: Lori [name] Date of Encounter: 05/30/2015     Active Problems:   Atrial fibrillation with RVR   A-fib    SUBJECTIVE  The patient feels better today.  She is anxious to go home if possible.  Telemetry shows frequent PACs but no atrial fibrillation.  Previously as an outpatient she had been given diltiazem but states that she has not been taking it recently because she did not know what it was for.  She denies any chest pain.  She has been on long-term Xarelto because of a prior history of paroxysmal atrial fibrillation.  CURRENT MEDS . Eluxadoline  100 mg Oral BID WC  . Fluticasone Furoate-Vilanterol  1 puff Inhalation Daily  . latanoprost  1 drop Both Eyes QHS  . pravastatin  20 mg Oral QPM  . Rivaroxaban  15 mg Oral Q supper  . saccharomyces boulardii  250 mg Oral BID  . tiotropium  18 mcg Inhalation Daily    OBJECTIVE  Filed Vitals:   05/30/15 0000 05/30/15 0200 05/30/15 0400 05/30/15 0500  BP: 119/66 142/89 115/95 133/59  Pulse: 78 99 60 82  Temp:   98.4 F (36.9 C)   TempSrc:   Oral   Resp: 18 25 19 17   Height:      Weight:      SpO2: 94% 97% 95% 95%    Intake/Output Summary (Last 24 hours) at 05/30/15 1016 Last data filed at 05/30/15 0600  Gross per 24 hour  Intake     71 ml  Output      0 ml  Net     71 ml   Filed Weights   05/29/15 1346 05/29/15 1917  Weight: 135 lb (61.236 kg) 133 lb 6.1 oz (60.5 kg)    PHYSICAL EXAM  General: Pleasant, NAD. Neuro: Alert and oriented X 3. Moves all extremities spontaneously. Psych: Normal affect. HEENT:  Normal  Neck: Supple without bruits or JVD. Lungs:  Resp regular and unlabored, CTA. Heart: Frequent premature atrial beats.  no s3, s4, or murmurs. Abdomen: Soft, non-tender, non-distended, BS + x 4.  Extremities: No clubbing, cyanosis or edema. DP/PT/Radials 2+ and equal bilaterally.  Accessory Clinical Findings  CBC  Recent Labs  05/29/15 1425 05/30/15 0401  WBC 6.6 8.4  HGB 13.8  13.5  HCT 41.5 39.9  MCV 86.3 86.0  PLT 383 778   Basic Metabolic Panel  Recent Labs  05/29/15 1425 05/30/15 0401  NA 136 135  K 3.8 3.5  CL 106 102  CO2 24 25  GLUCOSE 108* 95  BUN 26* 19  CREATININE 0.61 0.86  CALCIUM 9.1 8.8*  MG 2.1  --    Liver Function Tests  Recent Labs  05/30/15 0401  AST 19  ALT 25  ALKPHOS 69  BILITOT 0.5  PROT 5.8*  ALBUMIN 3.3*   No results for input(s): LIPASE, AMYLASE in the last 72 hours. Cardiac Enzymes No results for input(s): CKTOTAL, CKMB, CKMBINDEX, TROPONINI in the last 72 hours. BNP Invalid input(s): POCBNP D-Dimer No results for input(s): DDIMER in the last 72 hours. Hemoglobin A1C No results for input(s): HGBA1C in the last 72 hours. Fasting Lipid Panel No results for input(s): CHOL, HDL, LDLCALC, TRIG, CHOLHDL, LDLDIRECT in the last 72 hours. Thyroid Function Tests No results for input(s): TSH, T4TOTAL, T3FREE, THYROIDAB in the last 72 hours.  Invalid input(s): FREET3  TELE  Normal sinus rhythm with frequent and multifocal PACs.  No atrial fibrillation.  ECG  Radiology/Studies  Dg Chest 2 View  05/29/2015   CLINICAL DATA:  Dizziness and near syncope.  EXAM: CHEST  2 VIEW  COMPARISON:  03/27/2015  FINDINGS: Lungs are clear without airspace disease or pulmonary edema. Heart and mediastinum are within normal limits. Surgical anchors in the proximal right humerus. Trachea is midline. Mild scoliosis and degenerative changes in the thoracic spine.  IMPRESSION: No active cardiopulmonary disease.   Electronically Signed   By: Markus Daft M.D.   On: 05/29/2015 15:33    ASSESSMENT AND PLAN  1.  Past history of paroxysmal atrial fibrillation, on long-term anticoagulation 2.  Presently she is in normal sinus rhythm with frequent and multifocal PACs  Plan: We will stop her IV diltiazem and switch her to diltiazem CD 240 mg one daily Okay from cardiac standpoint for discharge later today.  Signed, Darlin Coco  MD

## 2015-05-31 NOTE — Discharge Summary (Signed)
Lori Redwood, MD Physician Signed Internal Medicine Progress Notes 05/30/2015 11:27 AM    Expand All Collapse All   Physician Discharge Summary  Lori [name] CVE:[id number] DOB: 1926/02/06 DOA: 05/29/2015  PCP: Lori Millers, MD  Admit date: 05/29/2015 Discharge date: 05/30/2015  Recommendations for Outpatient Follow-up: Patient has follow-up with someone with [facility] to establish primary care provider.  Discharge Diagnoses:  Principal Problem: Paroxysmal atrial fibrillation Active Problems:  Insomnia   Discharge Condition: Stable  Diet recommendation: Heart healthy  Filed Weights   05/29/15 1346 05/29/15 1917  Weight: 61.236 kg (135 lb) 60.5 kg (133 lb 6.1 oz)    History of present illness:  81 y.o. female sent from [facility] due to found to be in a fibrillation with rapid ventricular rhythm with heart rate into 130's, with complaints of sob/dizziness and weakness. Denies chest pain, no cough ,no fever. She reported not knowing what cardizem is for, so she has not been taking it. She is started on cardizem drip, heart rate better , now in the low 100's. Cardiology consulted by EDP felt patient was in sinus rhythm with frequent PACs, and MAT, hospitalist called to admit the patient.  Hospital Course:  Patient was started on Cardizem drip. Started back on oral Cardizem CD. Cleared by cardiology for discharge. Patient is feeling better. She requests a refill of Ambien. Reports that her primary care provider retired and she has an appointment with a new PCP upcoming  Procedures:  None  Consultations:  Cardiology  Discharge Exam: Filed Vitals:   05/30/15 0500  BP: 133/59  Pulse: 82  Temp:   Resp: 17    General: a and o Cardiovascular: RRR Respiratory: CTA  Discharge Instructions   Discharge Instructions    Diet - low sodium heart healthy  Complete by: As directed      Increase activity slowly   Complete by: As directed           Current Discharge Medication List    CONTINUE these medications which have CHANGED   Details  diltiazem (CARDIZEM CD) 240 MG 24 hr capsule Take 1 capsule (240 mg total) by mouth daily. Qty: 30 capsule, Refills: 1    zolpidem (AMBIEN) 5 MG tablet TAKE ONE TABLET AT BEDTIME AS NEEDED FOR SLEEP Qty: 20 tablet, Refills: 0      CONTINUE these medications which have NOT CHANGED   Details  acetaminophen (TYLENOL) 325 MG tablet Take 2 tablets (650 mg total) by mouth every 6 (six) hours as needed for mild pain (or Fever >/= 101).    bimatoprost (LUMIGAN) 0.01 % SOLN Place 1 drop into both eyes daily. Qty: 5 mL, Refills: 3    budesonide-formoterol (SYMBICORT) 160-4.5 MCG/ACT inhaler Inhale 2 puffs into the lungs 2 (two) times daily.    clindamycin (CLEOCIN) 300 MG capsule Take 600 mg by mouth once. For dental propcedures    dicyclomine (BENTYL) 10 MG capsule Take 1 capsule (10 mg total) by mouth 3 (three) times daily as needed. IBS Qty: 90 capsule, Refills: 3    Eluxadoline (VIBERZI) 100 MG TABS Take 100 mg by mouth 2 (two) times daily with a meal. Qty: 60 tablet, Refills: 3    Magnesium Oxide (MAGOX 400 PO) Take 1 tablet by mouth daily as needed (leg cramps).    rivaroxaban (XARELTO) 20 MG TABS tablet Take 1 tablet (20 mg total) by mouth daily with supper. Qty: 30 tablet, Refills: 3      STOP taking  these medications     valsartan (DIOVAN) 80 MG tablet      pravastatin (PRAVACHOL) 20 MG tablet      ALPRAZolam (XANAX) 0.25 MG tablet      Fluticasone Furoate-Vilanterol (BREO ELLIPTA) 200-25 MCG/INH AEPB      saccharomyces boulardii (FLORASTOR) 250 MG capsule      tiotropium (SPIRIVA) 18 MCG inhalation capsule        Allergies  Allergen Reactions  . Albuterol Sulfate Palpitations  . Levaquin [Levofloxacin In D5w] Other (See Comments)    QT  prolongation. Should avoid all flouroquinolones.   . Doxycycline Diarrhea  . Lactose Intolerance (Gi) Other (See Comments)  . Amoxicillin Other (See Comments)    REACTION: unspecified  . Codeine Other (See Comments)    Can take Hydrocodone  . Penicillins Hives, Itching and Rash  . Sulfa Antibiotics Nausea Only  . Sulfonamide Derivatives Nausea Only   Follow-up Information    Follow up with primary care provider.   Why: 2-3 weeks to establish care        The results of significant diagnostics from this hospitalization (including imaging, microbiology, ancillary and laboratory) are listed below for reference.    Significant Diagnostic Studies:  Imaging Results    Dg Chest 2 View  05/29/2015 CLINICAL DATA: Dizziness and near syncope. EXAM: CHEST 2 VIEW COMPARISON: 03/27/2015 FINDINGS: Lungs are clear without airspace disease or pulmonary edema. Heart and mediastinum are within normal limits. Surgical anchors in the proximal right humerus. Trachea is midline. Mild scoliosis and degenerative changes in the thoracic spine. IMPRESSION: No active cardiopulmonary disease. Electronically Signed By: Markus Daft M.D. On: 05/29/2015 15:33     Microbiology: No results found for this or any previous visit (from the past 240 hour(s)).   Labs: Basic Metabolic Panel:  Last Labs      Recent Labs Lab 05/29/15 1425 05/30/15 0401  NA 136 135  K 3.8 3.5  CL 106 102  CO2 24 25  GLUCOSE 108* 95  BUN 26* 19  CREATININE 0.61 0.86  CALCIUM 9.1 8.8*  MG 2.1 --      Liver Function Tests:  Last Labs      Recent Labs Lab 05/30/15 0401  AST 19  ALT 25  ALKPHOS 69  BILITOT 0.5  PROT 5.8*  ALBUMIN 3.3*      Last Labs     No results for input(s): LIPASE, AMYLASE in the last 168 hours.    Last Labs     No results for input(s): AMMONIA in the last 168 hours.   CBC:  Last Labs      Recent  Labs Lab 05/29/15 1425 05/30/15 0401  WBC 6.6 8.4  HGB 13.8 13.5  HCT 41.5 39.9  MCV 86.3 86.0  PLT 383 372     Cardiac Enzymes:  Last Labs     No results for input(s): CKTOTAL, CKMB, CKMBINDEX, TROPONINI in the last 168 hours.   BNP: BNP (last 3 results)  Recent Labs (within last 365 days)     Recent Labs  12/06/14 1849  BNP 30.8      ProBNP (last 3 results)  Recent Labs (within last 365 days)    No results for input(s): PROBNP in the last 8760 hours.    CBG:  Last Labs     No results for input(s): GLUCAP in the last 168 hours.       SignedDelfina [name] [facility] 05/30/2015, 11:27 AM

## 2015-06-01 ENCOUNTER — Telehealth: Payer: Self-pay | Admitting: Cardiology

## 2015-06-01 ENCOUNTER — Other Ambulatory Visit: Payer: Self-pay

## 2015-06-01 MED ORDER — ZOLPIDEM TARTRATE 5 MG PO TABS: 5.0000 mg | ORAL_TABLET | Freq: Every evening | ORAL | Status: DC | PRN

## 2015-06-01 NOTE — Telephone Encounter (Signed)
No answer, will try again later. Cannot leave message states memory if full.

## 2015-06-01 NOTE — Telephone Encounter (Signed)
Printed and signed, please fax.  

## 2015-06-01 NOTE — Telephone Encounter (Signed)
NewMessage  Pt husband calling to speak w/ Rn concerning pt's recent hospital stay. Pt was d/c on 9/10- no f/u ordered in AVS. Please call back and discuss.

## 2015-06-01 NOTE — Telephone Encounter (Signed)
Incoming fax for Rf?

## 2015-06-01 NOTE — Telephone Encounter (Signed)
Pt states she was discharged from [facility] 05/30/15 -pt was admitted 05/29/15 with rapid atrial fibrillation, medications were adjusted. Pt states she was dizzy during hospitalization and has continued to be dizzy since hospitalization.  Pt states nurse checked her BP and heart rate a short time ago, BP 120/58, heart rate was 89 today. Pt states she does not feel she is on the right medications and is requesting appt. I reviewed with Eligha Bridegroom has been scheduled to see Cecille Rubin 06/02/15 at [facility], pt aware.

## 2015-06-02 ENCOUNTER — Ambulatory Visit (INDEPENDENT_AMBULATORY_CARE_PROVIDER_SITE_OTHER): Payer: Medicare Other | Admitting: Nurse Practitioner

## 2015-06-02 ENCOUNTER — Encounter: Payer: Self-pay | Admitting: Nurse Practitioner

## 2015-06-02 VITALS — BP 90/60 | HR 97 | Ht 65.0 in | Wt 135.1 lb

## 2015-06-02 DIAGNOSIS — I471 Supraventricular tachycardia: Secondary | ICD-10-CM

## 2015-06-02 DIAGNOSIS — I482 Chronic atrial fibrillation, unspecified: Secondary | ICD-10-CM

## 2015-06-02 DIAGNOSIS — I4719 Other supraventricular tachycardia: Secondary | ICD-10-CM

## 2015-06-02 LAB — CBC
HCT: 41 % (ref 36.0–46.0)
Hemoglobin: 13.7 g/dL (ref 12.0–15.0)
MCHC: 33.3 g/dL (ref 30.0–36.0)
MCV: 86.9 fl (ref 78.0–100.0)
Platelets: 549 10*3/uL — ABNORMAL HIGH (ref 150.0–400.0)
RBC: 4.72 Mil/uL (ref 3.87–5.11)
RDW: 17.2 % — ABNORMAL HIGH (ref 11.5–15.5)
WBC: 6.5 10*3/uL (ref 4.0–10.5)

## 2015-06-02 LAB — HEPATIC FUNCTION PANEL
ALT: 23 U/L (ref 0–35)
AST: 16 U/L (ref 0–37)
Albumin: 3.8 g/dL (ref 3.5–5.2)
Alkaline Phosphatase: 92 U/L (ref 39–117)
Bilirubin, Direct: 0.1 mg/dL (ref 0.0–0.3)
Total Bilirubin: 0.4 mg/dL (ref 0.2–1.2)
Total Protein: 6.5 g/dL (ref 6.0–8.3)

## 2015-06-02 LAB — TSH: TSH: 1.23 u[IU]/mL (ref 0.35–4.50)

## 2015-06-02 LAB — MAGNESIUM: Magnesium: 2.1 mg/dL (ref 1.5–2.5)

## 2015-06-02 LAB — BASIC METABOLIC PANEL
BUN: 27 mg/dL — ABNORMAL HIGH (ref 6–23)
CO2: 27 mEq/L (ref 19–32)
Calcium: 9.4 mg/dL (ref 8.4–10.5)
Chloride: 104 mEq/L (ref 96–112)
Creatinine, Ser: 0.8 mg/dL (ref 0.40–1.20)
GFR: 72.08 mL/min (ref >60.00)
Glucose, Bld: 90 mg/dL (ref 70–99)
Potassium: 4.3 mEq/L (ref 3.5–5.1)
Sodium: 139 mEq/L (ref 135–145)

## 2015-06-02 MED ORDER — AMIODARONE HCL 200 MG PO TABS: 200.0000 mg | ORAL_TABLET | Freq: Two times a day (BID) | ORAL | Status: DC

## 2015-06-02 MED ORDER — ASPIRIN EC 81 MG PO TBEC: 81.0000 mg | DELAYED_RELEASE_TABLET | Freq: Every day | ORAL | Status: AC

## 2015-06-02 NOTE — Patient Instructions (Addendum)
We will be checking the following labs today - BMET, CBC, HPF, TSH and MG   Medication Instructions:    Continue with your current medicines but  STOP Xarelto  Start baby aspirin 81 mg a day  STOP Cardizem  Start Amiodarone 200 mg to take twice a day for one week and then once a day.    Testing/Procedures To Be Arranged:  N/A  Follow-Up:   See me in 2 weeks with EKG    Other Special Instructions:   N/A  Call the [place] office at [phone] if you have any questions, problems or concerns.

## 2015-06-02 NOTE — Telephone Encounter (Signed)
I spoke with patient and gave her the message from Dr. Mariea Clonts and she stated that she sees her heart Dr. And he has gotten everything straight and she hung up.
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
## 2015-06-04 ENCOUNTER — Ambulatory Visit (INDEPENDENT_AMBULATORY_CARE_PROVIDER_SITE_OTHER): Payer: Medicare Other | Admitting: Internal Medicine

## 2015-06-04 ENCOUNTER — Encounter: Payer: Self-pay | Admitting: Internal Medicine

## 2015-06-04 VITALS — BP 120/64 | HR 68 | Temp 97.7°F | Resp 16 | Wt 137.0 lb

## 2015-06-04 DIAGNOSIS — H9193 Unspecified hearing loss, bilateral: Secondary | ICD-10-CM | POA: Diagnosis not present

## 2015-06-04 DIAGNOSIS — R195 Other fecal abnormalities: Secondary | ICD-10-CM | POA: Diagnosis not present

## 2015-06-04 DIAGNOSIS — I4891 Unspecified atrial fibrillation: Secondary | ICD-10-CM

## 2015-06-04 DIAGNOSIS — R42 Dizziness and giddiness: Secondary | ICD-10-CM | POA: Diagnosis not present

## 2015-06-04 NOTE — Patient Instructions (Signed)
Perform isometric exercise of calves  ( while seated go up on toes to count of 5 & then onto heels for 5 count). Repeat  4- 5 times prior to standing if you've been seated or supine for any significant period of time as BP drops with such positions.   Please take a probiotic , Florastor , every day if the bowels are loose. This will replace the normal bacteria which  are necessary for formation of normal stool and processing of food.  Please see your Audiologist to have the hearing aids reassessed.

## 2015-06-04 NOTE — Progress Notes (Signed)
Pre visit review using our clinic review tool, if applicable. No additional management support is needed unless otherwise documented below in the visit note. 
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
## 2015-06-05 ENCOUNTER — Telehealth: Payer: Self-pay | Admitting: Nurse Practitioner

## 2015-06-05 NOTE — Telephone Encounter (Signed)
NA

## 2015-06-05 NOTE — Telephone Encounter (Signed)
Spoke with patient's husband, pt asleep. Pt's husband states he is not sure if pt has checked her heart rate or BP today, he was unaware pt had called the office.  Per Dr Kristine Royal pt to see him in the office next week.  Pt's husband aware pt scheduled to see Dr Aundra Dubin 06/10/15 at 12:45PM.  Pt's husband advised to have pt call the office if she has other questions or concerns.

## 2015-06-05 NOTE — Telephone Encounter (Signed)
New Message  Pt called states that she is experiencing extreme fatigue and she state that it is very important Lori [name] calls her back before the end of the day. The patient them disconnected the call before I could retrieve additional information. Please call back to discuss.

## 2015-06-10 ENCOUNTER — Encounter: Payer: Self-pay | Admitting: Cardiology

## 2015-06-10 ENCOUNTER — Ambulatory Visit (INDEPENDENT_AMBULATORY_CARE_PROVIDER_SITE_OTHER): Payer: Medicare Other | Admitting: Cardiology

## 2015-06-10 VITALS — BP 120/62 | HR 93 | Wt 139.8 lb

## 2015-06-10 DIAGNOSIS — E785 Hyperlipidemia, unspecified: Secondary | ICD-10-CM | POA: Diagnosis not present

## 2015-06-10 DIAGNOSIS — Z09 Encounter for follow-up examination after completed treatment for conditions other than malignant neoplasm: Secondary | ICD-10-CM | POA: Diagnosis not present

## 2015-06-10 DIAGNOSIS — I482 Chronic atrial fibrillation, unspecified: Secondary | ICD-10-CM

## 2015-06-10 DIAGNOSIS — I471 Supraventricular tachycardia: Secondary | ICD-10-CM | POA: Diagnosis not present

## 2015-06-10 DIAGNOSIS — Z9229 Personal history of other drug therapy: Secondary | ICD-10-CM

## 2015-06-10 MED ORDER — ZOLPIDEM TARTRATE 5 MG PO TABS: 5.0000 mg | ORAL_TABLET | Freq: Every evening | ORAL | Status: DC | PRN

## 2015-06-10 NOTE — Patient Instructions (Addendum)
Medication Instructions:  Your physician recommends that you continue on your current medications as directed. Please refer to the Current Medication list given to you today.   Labwork: Your physician recommends that you return for lab work in: 3 weeks for liver panel, TSH    Testing/Procedures: Your physician has recommended that you have a pulmonary function test. Pulmonary Function Tests are a group of tests that measure how well air moves in and out of your lungs.     Follow-Up: Your physician recommends that you schedule a follow-up appointment in: 2 months with Dr. Aundra Dubin

## 2015-06-11 DIAGNOSIS — I471 Supraventricular tachycardia: Secondary | ICD-10-CM | POA: Insufficient documentation

## 2015-06-11 DIAGNOSIS — I4719 Other supraventricular tachycardia: Secondary | ICD-10-CM | POA: Insufficient documentation
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
## 2015-06-16 ENCOUNTER — Ambulatory Visit: Payer: Medicare Other | Admitting: Nurse Practitioner

## 2015-06-16 ENCOUNTER — Ambulatory Visit (HOSPITAL_COMMUNITY)
Admission: RE | Admit: 2015-06-16 | Discharge: 2015-06-16 | Disposition: A | Discharge status: Home / Self Care | Payer: Medicare Other | Source: Ambulatory Visit | Attending: Cardiology | Admitting: Cardiology

## 2015-06-16 DIAGNOSIS — I482 Chronic atrial fibrillation, unspecified: Secondary | ICD-10-CM

## 2015-06-16 DIAGNOSIS — Z9229 Personal history of other drug therapy: Secondary | ICD-10-CM

## 2015-06-23 ENCOUNTER — Encounter (HOSPITAL_COMMUNITY): Payer: Medicare Other

## 2015-06-29 ENCOUNTER — Other Ambulatory Visit: Payer: Medicare Other

## 2015-07-02 ENCOUNTER — Other Ambulatory Visit (INDEPENDENT_AMBULATORY_CARE_PROVIDER_SITE_OTHER): Payer: Medicare Other | Admitting: *Deleted

## 2015-07-02 DIAGNOSIS — I10 Essential (primary) hypertension: Secondary | ICD-10-CM | POA: Diagnosis not present

## 2015-07-02 DIAGNOSIS — I519 Heart disease, unspecified: Secondary | ICD-10-CM

## 2015-07-02 LAB — HEPATIC FUNCTION PANEL
ALT: 15 U/L (ref 6–29)
AST: 16 U/L (ref 10–35)
Albumin: 4.1 g/dL (ref 3.6–5.1)
Alkaline Phosphatase: 95 U/L (ref 33–130)
BILIRUBIN INDIRECT: 0.4 mg/dL (ref 0.2–1.2)
Bilirubin, Direct: 0.1 mg/dL (ref <=0.2)
TOTAL PROTEIN: 6.5 g/dL (ref 6.1–8.1)
Total Bilirubin: 0.5 mg/dL (ref 0.2–1.2)

## 2015-07-02 LAB — TSH: TSH: 3.148 u[IU]/mL (ref 0.350–4.500)

## 2015-07-02 NOTE — Addendum Note (Signed)
Addended by: Eulis Foster on: 07/02/2015 08:18 AM   Modules accepted: Orders

## 2015-07-02 NOTE — Addendum Note (Signed)
Addended by: Eulis Foster on: 07/02/2015 10:15 AM   Modules accepted: Orders

## 2015-07-13 ENCOUNTER — Other Ambulatory Visit: Payer: Self-pay

## 2015-07-13 DIAGNOSIS — L821 Other seborrheic keratosis: Secondary | ICD-10-CM | POA: Diagnosis not present

## 2015-07-13 DIAGNOSIS — D225 Melanocytic nevi of trunk: Secondary | ICD-10-CM | POA: Diagnosis not present

## 2015-07-13 DIAGNOSIS — L570 Actinic keratosis: Secondary | ICD-10-CM | POA: Diagnosis not present

## 2015-07-13 DIAGNOSIS — Z85828 Personal history of other malignant neoplasm of skin: Secondary | ICD-10-CM | POA: Diagnosis not present

## 2015-07-13 DIAGNOSIS — D692 Other nonthrombocytopenic purpura: Secondary | ICD-10-CM | POA: Diagnosis not present

## 2015-07-13 MED ORDER — AMIODARONE HCL 200 MG PO TABS: ORAL_TABLET | ORAL | Status: DC

## 2015-07-27 ENCOUNTER — Ambulatory Visit (HOSPITAL_COMMUNITY)
Admission: RE | Admit: 2015-07-27 | Discharge: 2015-07-27 | Disposition: A | Discharge status: Home / Self Care | Payer: Medicare Other | Source: Ambulatory Visit | Attending: Cardiology | Admitting: Cardiology

## 2015-07-27 DIAGNOSIS — Z029 Encounter for administrative examinations, unspecified: Secondary | ICD-10-CM | POA: Insufficient documentation

## 2015-07-27 LAB — PULMONARY FUNCTION TEST
DL/VA % PRED: 88 %
DL/VA: 4.34 ml/min/mmHg/L
DLCO unc % pred: 44 %
DLCO unc: 11.37 ml/min/mmHg
FEF 25-75 POST: 0.35 L/s
FEF 25-75 Pre: 0.34 L/sec
FEF2575-%CHANGE-POST: 2 %
FEF2575-%PRED-POST: 32 %
FEF2575-%Pred-Pre: 31 %
FEV1-%CHANGE-POST: 1 %
FEV1-%PRED-PRE: 47 %
FEV1-%Pred-Post: 48 %
FEV1-PRE: 0.84 L
FEV1-Post: 0.86 L
FEV1FVC-%CHANGE-POST: 10 %
FEV1FVC-%PRED-PRE: 78 %
FEV6-%Change-Post: -4 %
FEV6-%Pred-Post: 60 %
FEV6-%Pred-Pre: 63 %
FEV6-PRE: 1.43 L
FEV6-Post: 1.37 L
FEV6FVC-%Change-Post: 4 %
FEV6FVC-%Pred-Post: 106 %
FEV6FVC-%Pred-Pre: 101 %
FVC-%Change-Post: -7 %
FVC-%PRED-POST: 57 %
FVC-%PRED-PRE: 62 %
FVC-POST: 1.37 L
FVC-PRE: 1.49 L
POST FEV1/FVC RATIO: 62 %
PRE FEV6/FVC RATIO: 96 %
Post FEV6/FVC ratio: 100 %
Pre FEV1/FVC ratio: 57 %
RV % pred: 203 %
RV: 5.26 L
TLC % pred: 128 %
TLC: 6.69 L

## 2015-07-27 MED ORDER — LEVALBUTEROL HCL 0.63 MG/3ML IN NEBU
0.6300 mg | INHALATION_SOLUTION | Freq: Once | RESPIRATORY_TRACT | Status: AC
  Administered 2015-07-27: 0.63 mg via RESPIRATORY_TRACT
  Filled 2015-07-27: qty 3

## 2015-08-04 ENCOUNTER — Telehealth: Payer: Self-pay | Admitting: Cardiology

## 2015-08-04 DIAGNOSIS — J449 Chronic obstructive pulmonary disease, unspecified: Secondary | ICD-10-CM

## 2015-08-04 NOTE — Telephone Encounter (Signed)
Spoke with patient about recent PFT results.  Pt agreed to referral to pulmonary (she has seen Dr Chase Caller ) in the past.

## 2015-08-04 NOTE — Telephone Encounter (Signed)
Notes Recorded by Larey Dresser, MD on 07/30/2015 at 2:25 PM  She has severe obstructive airways disease. She was a remote smoker. Given the abnormality on PFTs, would like to have her evaluated by pulmonology. If she is agreeable, please make referral.

## 2015-08-04 NOTE — Telephone Encounter (Signed)
New problem   Pt stated he is returning a call.

## 2015-08-11 ENCOUNTER — Ambulatory Visit: Payer: Medicare Other | Admitting: Nurse Practitioner

## 2015-08-18 ENCOUNTER — Other Ambulatory Visit: Payer: Self-pay | Admitting: Cardiology

## 2015-08-24 DIAGNOSIS — F418 Other specified anxiety disorders: Secondary | ICD-10-CM | POA: Diagnosis not present

## 2015-08-24 DIAGNOSIS — J45998 Other asthma: Secondary | ICD-10-CM | POA: Diagnosis not present

## 2015-08-24 DIAGNOSIS — Z1389 Encounter for screening for other disorder: Secondary | ICD-10-CM | POA: Diagnosis not present

## 2015-08-24 DIAGNOSIS — M7062 Trochanteric bursitis, left hip: Secondary | ICD-10-CM | POA: Diagnosis not present

## 2015-08-24 DIAGNOSIS — I10 Essential (primary) hypertension: Secondary | ICD-10-CM | POA: Diagnosis not present

## 2015-08-24 DIAGNOSIS — I480 Paroxysmal atrial fibrillation: Secondary | ICD-10-CM | POA: Diagnosis not present

## 2015-09-04 ENCOUNTER — Encounter: Payer: Self-pay | Admitting: Podiatry

## 2015-09-04 ENCOUNTER — Ambulatory Visit (INDEPENDENT_AMBULATORY_CARE_PROVIDER_SITE_OTHER): Payer: Medicare Other | Admitting: Podiatry

## 2015-09-04 VITALS — BP 172/81 | HR 72 | Resp 16

## 2015-09-04 DIAGNOSIS — L03011 Cellulitis of right finger: Secondary | ICD-10-CM

## 2015-09-04 DIAGNOSIS — L03031 Cellulitis of right toe: Secondary | ICD-10-CM

## 2015-09-04 NOTE — Patient Instructions (Signed)

## 2015-09-13 NOTE — Progress Notes (Signed)
Subjective:     Patient ID: Lori [name], female   DOB: May 24, 1926, 81 y.o.   MRN: [id number]  HPI patient states I've had a lot of discomfort in my right big toe on the one side and it makes it hard for me to wear shoes and it's been draining a little bit   Review of Systems     Objective:   Physical Exam  neurovascular status intact no change in health history with patient having an incurvated medial border of the right hallux that's painful and there is slight redness distal with no active drainage noted    Assessment:      paronychia infection right hallux medial border    Plan:      H&P condition reviewed and carefully injected 60 mg Xylocaine Marcaine mixture and remove the medial border and proud flesh and necrotic tissue. Allowed channel for drainage applied sterile dressing and instructed on soaks. Reappoint if trouble problems should occur

## 2015-09-16 ENCOUNTER — Telehealth: Payer: Self-pay | Admitting: *Deleted

## 2015-09-16 NOTE — Telephone Encounter (Signed)
Left message for patient at [phone] (Home #) to check to see how they were doing from their paronychia procedure that was performed on Friday, September 04, 2015. Waiting for a response.

## 2015-09-17 ENCOUNTER — Ambulatory Visit (INDEPENDENT_AMBULATORY_CARE_PROVIDER_SITE_OTHER): Payer: Medicare Other | Admitting: Podiatry

## 2015-09-17 DIAGNOSIS — L03011 Cellulitis of right finger: Secondary | ICD-10-CM | POA: Diagnosis not present

## 2015-09-17 NOTE — Patient Instructions (Signed)

## 2015-09-17 NOTE — Progress Notes (Signed)
Subjective:     Patient ID: Lori [name], female   DOB: Jan 12, 1926, 81 y.o.   MRN: [id number]  HPI patient presents stating this area has been irritated on the end of my big toe ever since I had the procedure performed   Review of Systems     Objective:   Physical Exam Neurovascular status unchanged with area of irritation on the distal medial aspect of the right hallux where there was some skin that was traumatized would remove the corner the nail and unfortunately the skin was connected into the underlying nailbed    Assessment:     Irritated tissue right hallux dorsal lateral side but no indications of acute infection    Plan:     Applied Silvadene to the area and advised on try to wear open toed shoes as much as possible continue soaks allow it to air dry and it should heal uneventfully. Reappoint as needed

## 2015-09-22 ENCOUNTER — Telehealth: Payer: Self-pay | Admitting: *Deleted

## 2015-09-22 NOTE — Telephone Encounter (Signed)
Left message for patient at [phone] (Home #) to check to see how they were doing from their paronychia that was performed on Thursday, September 17, 2015. Waiting for a response.

## 2015-11-02 ENCOUNTER — Ambulatory Visit (INDEPENDENT_AMBULATORY_CARE_PROVIDER_SITE_OTHER): Payer: Medicare Other | Admitting: Podiatry

## 2015-11-02 ENCOUNTER — Encounter: Payer: Self-pay | Admitting: Podiatry

## 2015-11-02 VITALS — Resp 16

## 2015-11-02 DIAGNOSIS — L03031 Cellulitis of right toe: Secondary | ICD-10-CM | POA: Diagnosis not present

## 2015-11-02 DIAGNOSIS — L03011 Cellulitis of right finger: Secondary | ICD-10-CM

## 2015-11-03 NOTE — Progress Notes (Signed)
Subjective:     Patient ID: Lori [name], female   DOB: Feb 19, 1926, 82 y.o.   MRN: [id number]  HPI patient presents stating that the nail on her right foot is still bothering her and she cannot wear a lot of her different shoes   Review of Systems     Objective:   Physical Exam  neurovascular status is unchanged and distal skin on the right foot has healed and is completely resolved. The nailbed itself is thickened dystrophic and there is discomfort across the proximal portion especially medial side but no drainage is noted currently    Assessment:      damaged nail plate of the right foot localized in nature hallux that is irritating her with certain shoes but no indications or redness erythema or drainage    Plan:      explained condition to her and husband. Today I debrided all dorsal tissue and nailbed from the right hallux and carefully teased the medial side with no drainage noted. If this does not improve her situation we will need to her toe again and cleaned out the corner but I do not see significant pathology currently and I explained to her unfortunately sometimes nail plates themselves are not healthy and there is not a lot of recourse that we can do but I do not recommend any form of permanent procedure at this time due to frail circulatory status and advanced age

## 2015-11-12 DIAGNOSIS — H9193 Unspecified hearing loss, bilateral: Secondary | ICD-10-CM | POA: Diagnosis not present

## 2015-11-12 DIAGNOSIS — R0600 Dyspnea, unspecified: Secondary | ICD-10-CM | POA: Diagnosis not present

## 2015-11-12 DIAGNOSIS — I471 Supraventricular tachycardia: Secondary | ICD-10-CM | POA: Diagnosis not present

## 2015-11-12 DIAGNOSIS — M4304 Spondylolysis, thoracic region: Secondary | ICD-10-CM | POA: Diagnosis not present

## 2015-11-12 DIAGNOSIS — J449 Chronic obstructive pulmonary disease, unspecified: Secondary | ICD-10-CM | POA: Diagnosis not present

## 2015-11-12 DIAGNOSIS — Z6823 Body mass index (BMI) 23.0-23.9, adult: Secondary | ICD-10-CM | POA: Diagnosis not present

## 2015-11-12 DIAGNOSIS — M549 Dorsalgia, unspecified: Secondary | ICD-10-CM | POA: Diagnosis not present

## 2015-11-12 DIAGNOSIS — M5136 Other intervertebral disc degeneration, lumbar region: Secondary | ICD-10-CM | POA: Diagnosis not present

## 2015-11-13 DIAGNOSIS — H401122 Primary open-angle glaucoma, left eye, moderate stage: Secondary | ICD-10-CM | POA: Diagnosis not present

## 2015-11-13 DIAGNOSIS — H401112 Primary open-angle glaucoma, right eye, moderate stage: Secondary | ICD-10-CM | POA: Diagnosis not present

## 2015-11-13 DIAGNOSIS — H10413 Chronic giant papillary conjunctivitis, bilateral: Secondary | ICD-10-CM | POA: Diagnosis not present

## 2015-11-24 DIAGNOSIS — S81811A Laceration without foreign body, right lower leg, initial encounter: Secondary | ICD-10-CM | POA: Diagnosis not present

## 2015-12-01 DIAGNOSIS — Z85828 Personal history of other malignant neoplasm of skin: Secondary | ICD-10-CM | POA: Diagnosis not present

## 2015-12-01 DIAGNOSIS — L570 Actinic keratosis: Secondary | ICD-10-CM | POA: Diagnosis not present

## 2015-12-01 DIAGNOSIS — D692 Other nonthrombocytopenic purpura: Secondary | ICD-10-CM | POA: Diagnosis not present

## 2015-12-03 ENCOUNTER — Other Ambulatory Visit: Payer: Self-pay

## 2015-12-03 DIAGNOSIS — Z1231 Encounter for screening mammogram for malignant neoplasm of breast: Secondary | ICD-10-CM

## 2015-12-14 DIAGNOSIS — H401112 Primary open-angle glaucoma, right eye, moderate stage: Secondary | ICD-10-CM | POA: Diagnosis not present

## 2015-12-15 ENCOUNTER — Ambulatory Visit: Payer: Medicare Other

## 2015-12-15 DIAGNOSIS — Z1231 Encounter for screening mammogram for malignant neoplasm of breast: Secondary | ICD-10-CM | POA: Diagnosis not present

## 2015-12-16 ENCOUNTER — Other Ambulatory Visit: Payer: Self-pay | Admitting: Internal Medicine

## 2015-12-21 ENCOUNTER — Telehealth: Payer: Self-pay | Admitting: Cardiology

## 2015-12-21 NOTE — Telephone Encounter (Signed)
New Message  Pt requested to speak w/ Rn- pt stated she has high BP  Pt c/o BP issue: STAT if pt c/o blurred vision, one-sided weakness or slurred speech  1. What are your last 5 BP readings? Yesterday 4/2- 186-98, today 4/3 174-78  2. Are you having any other symptoms (ex. Dizziness, headache, blurred vision, passed out)? Dizziness today and weakness  3. What is your BP issue? BP is high

## 2015-12-21 NOTE — Telephone Encounter (Signed)
NA at phone number listed, no answering machine.

## 2015-12-22 ENCOUNTER — Encounter: Payer: Self-pay | Admitting: Nurse Practitioner

## 2015-12-22 ENCOUNTER — Ambulatory Visit (INDEPENDENT_AMBULATORY_CARE_PROVIDER_SITE_OTHER): Payer: Medicare Other | Admitting: Nurse Practitioner

## 2015-12-22 VITALS — BP 120/74 | HR 72 | Ht 65.0 in | Wt 139.0 lb

## 2015-12-22 DIAGNOSIS — I471 Supraventricular tachycardia: Secondary | ICD-10-CM | POA: Diagnosis not present

## 2015-12-22 DIAGNOSIS — Z09 Encounter for follow-up examination after completed treatment for conditions other than malignant neoplasm: Secondary | ICD-10-CM | POA: Diagnosis not present

## 2015-12-22 DIAGNOSIS — Z9229 Personal history of other drug therapy: Secondary | ICD-10-CM

## 2015-12-22 MED ORDER — AMIODARONE HCL 200 MG PO TABS: 200.0000 mg | ORAL_TABLET | Freq: Every day | ORAL | Status: DC

## 2015-12-22 NOTE — Telephone Encounter (Signed)
Bernadette states pt's BP 114/60, apical heart rate 79, irregular.

## 2015-12-22 NOTE — Patient Instructions (Addendum)
We will be checking the following labs today - NONE   Medication Instructions:    Continue with your current medicines. I have refilled the Amiodarone.   Please take your medicines     Testing/Procedures To Be Arranged:  N/A  Follow-Up:   See Dr. Aundra Dubin in 3 months.     Other Special Instructions:   N/A    If you need a refill on your cardiac medications before your next appointment, please call your pharmacy.   Call the [place] office at [phone] if you have any questions, problems or concerns.

## 2015-12-22 NOTE — Telephone Encounter (Signed)
Follow up    Lori [name] is calling from [facility]   She wants to speak to the rn about pt care and phone call 12/22/15  rn is calling for rn
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
## 2015-12-22 NOTE — Telephone Encounter (Signed)
I have attempted to contact pt at [phone], there is no answer, no answering machine.

## 2015-12-22 NOTE — Telephone Encounter (Signed)
Pt advised appt has been scheduled today at 3:30PM with Kathrene Alu. Pt advised not to drive, pt verbalized understanding.

## 2015-12-22 NOTE — Telephone Encounter (Signed)
Pt states about 3 days ago she was extremely dizzy.  Pt states she checked her BP on Saturday it was 186/98. Pt states she has been taking Diovan for years but had gotten lazy and quit taking it. Pt states she restarted Diovan about 3 days ago.  Pt states her BP yesterday earlier in the day was 174/78, BP around 5:30PM yesterday 144/63.  Pt states she has not checked her BP today. Pt states she will check her BP now.

## 2015-12-22 NOTE — Telephone Encounter (Signed)
Pt states BP 136/87 a short time ago, she does not know her heart rate. Pt states she is very dizzy and feels her heart is beating fast at this time.  Pt states she felt her heart was racing last night. Pt denies other symptoms at this time.  Pt advised if she is dizzy and having fast heart beat she should report to ED for further evaluation. Pt states she does not want to do that, she will go to nurse at [facility] to have her BP and pulse checked now.

## 2015-12-23 ENCOUNTER — Telehealth: Payer: Self-pay | Admitting: Nurse Practitioner

## 2015-12-23 NOTE — Telephone Encounter (Signed)
Called pt back and s/w Hassan Rowan in regards for pt. Stated pt has bladder infection and wanted [place] to call in script.  State will have to call Dr. Shon Baton office we only do cardiac medications. Hassan Rowan stated understanding.

## 2015-12-23 NOTE — Telephone Encounter (Signed)
New MEssage  Pt was seen yest- 4/4- wanted to discuss new Rx. Please call back and discuss.

## 2015-12-25 DIAGNOSIS — N390 Urinary tract infection, site not specified: Secondary | ICD-10-CM | POA: Diagnosis not present

## 2015-12-25 DIAGNOSIS — R8299 Other abnormal findings in urine: Secondary | ICD-10-CM | POA: Diagnosis not present

## 2015-12-31 DIAGNOSIS — M25552 Pain in left hip: Secondary | ICD-10-CM | POA: Diagnosis not present

## 2016-01-06 DIAGNOSIS — N390 Urinary tract infection, site not specified: Secondary | ICD-10-CM | POA: Diagnosis not present

## 2016-01-06 DIAGNOSIS — I10 Essential (primary) hypertension: Secondary | ICD-10-CM | POA: Diagnosis not present

## 2016-01-06 DIAGNOSIS — Z6823 Body mass index (BMI) 23.0-23.9, adult: Secondary | ICD-10-CM | POA: Diagnosis not present

## 2016-01-06 DIAGNOSIS — R42 Dizziness and giddiness: Secondary | ICD-10-CM | POA: Diagnosis not present

## 2016-01-25 DIAGNOSIS — M79674 Pain in right toe(s): Secondary | ICD-10-CM | POA: Diagnosis not present

## 2016-01-25 DIAGNOSIS — Z6823 Body mass index (BMI) 23.0-23.9, adult: Secondary | ICD-10-CM | POA: Diagnosis not present

## 2016-01-25 DIAGNOSIS — M7062 Trochanteric bursitis, left hip: Secondary | ICD-10-CM | POA: Diagnosis not present

## 2016-01-26 DIAGNOSIS — M79671 Pain in right foot: Secondary | ICD-10-CM | POA: Diagnosis not present

## 2016-01-26 DIAGNOSIS — L603 Nail dystrophy: Secondary | ICD-10-CM | POA: Diagnosis not present

## 2016-02-20 DIAGNOSIS — R0602 Shortness of breath: Secondary | ICD-10-CM | POA: Diagnosis not present

## 2016-02-20 DIAGNOSIS — J22 Unspecified acute lower respiratory infection: Secondary | ICD-10-CM | POA: Diagnosis not present

## 2016-02-20 DIAGNOSIS — I482 Chronic atrial fibrillation: Secondary | ICD-10-CM | POA: Diagnosis not present

## 2016-03-07 ENCOUNTER — Other Ambulatory Visit: Payer: Self-pay | Admitting: Internal Medicine

## 2016-03-31 DIAGNOSIS — D692 Other nonthrombocytopenic purpura: Secondary | ICD-10-CM | POA: Diagnosis not present

## 2016-04-01 ENCOUNTER — Telehealth: Payer: Self-pay | Admitting: Cardiology

## 2016-04-01 NOTE — Telephone Encounter (Signed)
LMTCB

## 2016-04-01 NOTE — Telephone Encounter (Signed)
Mrs. Isles is calling because she is having a reaction to Amiodarone 200 mg , states that she has broke out from her elbows to her hands , it like red blotches on her . Please call  Thanks

## 2016-04-04 NOTE — Telephone Encounter (Signed)
LMCTB

## 2016-04-06 NOTE — Telephone Encounter (Signed)
Pt aware she has an appointment with Dr Aundra Dubin 04/11/16 at 1:45PM

## 2016-04-06 NOTE — Telephone Encounter (Signed)
Pt states she is in [place] now and will be returning to [place] the end of this week, she is requesting an appointment with Dr Aundra Dubin the first of next week. Pt advised during earlier phone call  I will try to work out appt time and call her back.   Pt scheduled to see Dr Aundra Dubin 04/11/16 at 1:45PM-pt is NOT aware of this appt time, [facility] for pt to let her know appt has been scheduled with Dr Aundra Dubin.

## 2016-04-06 NOTE — Telephone Encounter (Signed)
I will forward to Dr McLean for review 

## 2016-04-06 NOTE — Telephone Encounter (Signed)
Pt states that she went to her dermatologist last week because of a rash on both arms to her elbows.   Pt states her dermatologist told her the amiodarone was causing the rash and she needed to talk with her cardiologist about taking a different medication.   Pt states the dermatologist did not have any further recommendations and the rash is about the same.

## 2016-04-11 ENCOUNTER — Encounter: Payer: Self-pay | Admitting: Cardiology

## 2016-04-11 ENCOUNTER — Ambulatory Visit (INDEPENDENT_AMBULATORY_CARE_PROVIDER_SITE_OTHER): Payer: Medicare Other | Admitting: Cardiology

## 2016-04-11 VITALS — BP 122/60 | HR 80 | Ht 65.0 in | Wt 133.0 lb

## 2016-04-11 DIAGNOSIS — R0609 Other forms of dyspnea: Secondary | ICD-10-CM

## 2016-04-11 DIAGNOSIS — R072 Precordial pain: Secondary | ICD-10-CM

## 2016-04-11 DIAGNOSIS — J45909 Unspecified asthma, uncomplicated: Secondary | ICD-10-CM

## 2016-04-11 DIAGNOSIS — R0602 Shortness of breath: Secondary | ICD-10-CM | POA: Diagnosis not present

## 2016-04-11 DIAGNOSIS — I471 Supraventricular tachycardia: Secondary | ICD-10-CM

## 2016-04-11 DIAGNOSIS — R079 Chest pain, unspecified: Secondary | ICD-10-CM | POA: Diagnosis not present

## 2016-04-11 MED ORDER — DILTIAZEM HCL ER COATED BEADS 120 MG PO CP24: 120.0000 mg | ORAL_CAPSULE | Freq: Every day | ORAL | 3 refills | Status: DC
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
## 2016-04-11 NOTE — Patient Instructions (Signed)
Medication Instructions:  Start Diltiazem cd 120mg  daily  Labwork: BMET/BNP/CBCd/TSH today  Testing/Procedures: Your physician has requested that you have an echocardiogram. Echocardiography is a painless test that uses sound waves to create images of your heart. It provides your doctor with information about the size and shape of your heart and how well your heart's chambers and valves are working. This procedure takes approximately one hour. There are no restrictions for this procedure.  Your physician has requested that you have a lexiscan myoview. For further information please visit [email or web address]. Please follow instruction sheet, as given.  Your physician has recommended that you have a pulmonary function test. Pulmonary Function Tests are a group of tests that measure how well air moves in and out of your lungs.  Your physician has recommended that you wear a holter monitor. Holter monitors are medical devices that record the heart's electrical activity. Doctors most often use these monitors to diagnose arrhythmias. Arrhythmias are problems with the speed or rhythm of the heartbeat. The monitor is a small, portable device. You can wear one while you do your normal daily activities. This is usually used to diagnose what is causing palpitations/syncope (passing out). 61 HOUR   Follow-Up: Your physician recommends that you schedule a follow-up appointment in: 3 weeks with Dr Aundra Dubin    Any Other Special Instructions Will Be Listed Below (If Applicable). You have been referred to [facility]     If you need a refill on your cardiac medications before your next appointment, please call your pharmacy.

## 2016-04-12 ENCOUNTER — Telehealth: Payer: Self-pay | Admitting: *Deleted

## 2016-04-12 ENCOUNTER — Other Ambulatory Visit: Payer: Medicare Other

## 2016-04-12 DIAGNOSIS — E875 Hyperkalemia: Secondary | ICD-10-CM

## 2016-04-12 LAB — BASIC METABOLIC PANEL
ANION GAP: 8 (ref 5–15)
BUN: 26 mg/dL — ABNORMAL HIGH (ref 7–25)
BUN: 20 mg/dL (ref 6–20)
CALCIUM: 10 mg/dL (ref 8.6–10.4)
CALCIUM: 9.5 mg/dL (ref 8.9–10.3)
CO2: 26 mmol/L (ref 20–31)
CO2: 27 mmol/L (ref 22–32)
CREATININE: 0.8 mg/dL (ref 0.44–1.00)
Chloride: 107 mmol/L (ref 98–110)
Chloride: 106 mmol/L (ref 101–111)
Creat: 0.88 mg/dL (ref 0.60–0.88)
GLUCOSE: 107 mg/dL — AB (ref 65–99)
Glucose, Bld: 125 mg/dL — ABNORMAL HIGH (ref 65–99)
Potassium: 6 mmol/L — ABNORMAL HIGH (ref 3.5–5.3)
Potassium: 4.5 mmol/L (ref 3.5–5.1)
SODIUM: 144 mmol/L (ref 135–146)
Sodium: 141 mmol/L (ref 135–145)

## 2016-04-12 LAB — CBC WITH DIFFERENTIAL/PLATELET
BASOS PCT: 0 %
Basophils Absolute: 0 cells/uL (ref 0–200)
Eosinophils Absolute: 41 cells/uL (ref 15–500)
Eosinophils Relative: 1 %
HCT: 38.1 % (ref 35.0–45.0)
Hemoglobin: 12.5 g/dL (ref 11.7–15.5)
LYMPHS PCT: 29 %
Lymphs Abs: 1189 cells/uL (ref 850–3900)
MCH: 33.1 pg — ABNORMAL HIGH (ref 27.0–33.0)
MCHC: 32.8 g/dL (ref 32.0–36.0)
MCV: 100.8 fL — AB (ref 80.0–100.0)
MONOS PCT: 5 %
MPV: 8.5 fL (ref 7.5–12.5)
Monocytes Absolute: 205 cells/uL (ref 200–950)
Neutro Abs: 2665 cells/uL (ref 1500–7800)
Neutrophils Relative %: 65 %
PLATELETS: 693 10*3/uL — AB (ref 140–400)
RBC: 3.78 MIL/uL — AB (ref 3.80–5.10)
RDW: 15.8 % — AB (ref 11.0–15.0)
WBC: 4.1 10*3/uL (ref 3.8–10.8)

## 2016-04-12 LAB — BRAIN NATRIURETIC PEPTIDE: BRAIN NATRIURETIC PEPTIDE: 77.9 pg/mL (ref <100)

## 2016-04-12 LAB — TSH: TSH: 2.78 mIU/L

## 2016-04-12 NOTE — Telephone Encounter (Signed)
Notes Recorded by Larey Dresser, MD on 04/12/2016 at 8:19 AM EDT Needs stat recheck, on no K at home and creatinine stable.  Pt notified, will come to office now for stat repeat BMET.

## 2016-04-13 ENCOUNTER — Encounter: Payer: Self-pay | Admitting: *Deleted

## 2016-04-13 NOTE — Progress Notes (Signed)
Patient ID: Lori [name], female   DOB: May 03, 1926, 82 y.o.   MRN: [id number] Patient did not show up for 04/13/16, 9:00 AM, appointment, to have a 48 hour holter monitor applied.

## 2016-04-13 NOTE — Progress Notes (Signed)
Lori [name], could you check in with her about the monitor? She may have forgotten.

## 2016-04-13 NOTE — Progress Notes (Signed)
Attempted to call the pt x 2 to inform her of missed appt to have her 48 hour holter monitor placed.  Per our monitor tech, pt no showed for her appt today.  Will defer this to Dr Claris Gladden covering RN for further follow-up with this pt.

## 2016-04-14 NOTE — Progress Notes (Signed)
Pt states she does not want to have monitor done, she will do all the other tests ordered but she does not want to have monitor.   I will forward to Dr Aundra Dubin.

## 2016-04-20 ENCOUNTER — Ambulatory Visit (INDEPENDENT_AMBULATORY_CARE_PROVIDER_SITE_OTHER): Payer: Medicare Other

## 2016-04-20 DIAGNOSIS — R0602 Shortness of breath: Secondary | ICD-10-CM

## 2016-04-20 DIAGNOSIS — I471 Supraventricular tachycardia: Secondary | ICD-10-CM

## 2016-04-20 DIAGNOSIS — R079 Chest pain, unspecified: Secondary | ICD-10-CM | POA: Diagnosis not present

## 2016-04-20 DIAGNOSIS — I4719 Other supraventricular tachycardia: Secondary | ICD-10-CM

## 2016-04-20 DIAGNOSIS — J45909 Unspecified asthma, uncomplicated: Secondary | ICD-10-CM

## 2016-04-25 ENCOUNTER — Telehealth (HOSPITAL_COMMUNITY): Payer: Self-pay | Admitting: *Deleted

## 2016-04-25 NOTE — Telephone Encounter (Signed)
Patient given detailed instructions per Myocardial Perfusion Study Information Sheet for the test on 04/27/16 at 0945. Patient notified to arrive 15 minutes early and that it is imperative to arrive on time for appointment to keep from having the test rescheduled.  If you need to cancel or reschedule your appointment, please call the office within 24 hours of your appointment. Failure to do so may result in a cancellation of your appointment, and a $50 no show fee. Patient verbalized understand Tyna Huertas, Ranae Palms

## 2016-04-26 ENCOUNTER — Encounter: Payer: Self-pay | Admitting: Cardiology

## 2016-04-27 ENCOUNTER — Ambulatory Visit (HOSPITAL_BASED_OUTPATIENT_CLINIC_OR_DEPARTMENT_OTHER): Payer: Medicare Other

## 2016-04-27 ENCOUNTER — Other Ambulatory Visit: Payer: Self-pay

## 2016-04-27 ENCOUNTER — Ambulatory Visit (HOSPITAL_COMMUNITY): Payer: Medicare Other | Attending: Cardiology

## 2016-04-27 DIAGNOSIS — I371 Nonrheumatic pulmonary valve insufficiency: Secondary | ICD-10-CM | POA: Diagnosis not present

## 2016-04-27 DIAGNOSIS — R0609 Other forms of dyspnea: Secondary | ICD-10-CM | POA: Insufficient documentation

## 2016-04-27 DIAGNOSIS — I471 Supraventricular tachycardia: Secondary | ICD-10-CM

## 2016-04-27 DIAGNOSIS — R079 Chest pain, unspecified: Secondary | ICD-10-CM | POA: Diagnosis not present

## 2016-04-27 DIAGNOSIS — I119 Hypertensive heart disease without heart failure: Secondary | ICD-10-CM | POA: Diagnosis not present

## 2016-04-27 DIAGNOSIS — I071 Rheumatic tricuspid insufficiency: Secondary | ICD-10-CM | POA: Insufficient documentation

## 2016-04-27 DIAGNOSIS — I340 Nonrheumatic mitral (valve) insufficiency: Secondary | ICD-10-CM | POA: Insufficient documentation

## 2016-04-27 DIAGNOSIS — R0602 Shortness of breath: Secondary | ICD-10-CM

## 2016-04-27 DIAGNOSIS — J45909 Unspecified asthma, uncomplicated: Secondary | ICD-10-CM | POA: Insufficient documentation

## 2016-04-27 LAB — MYOCARDIAL PERFUSION IMAGING
CHL CUP NUCLEAR SDS: 11
CHL CUP RESTING HR STRESS: 62 {beats}/min
CSEPPHR: 84 {beats}/min
LHR: 0.23
LV dias vol: 67 mL (ref 46–106)
LVSYSVOL: 19 mL
SRS: 6
SSS: 17
TID: 0.9

## 2016-04-27 MED ORDER — TECHNETIUM TC 99M TETROFOSMIN IV KIT
31.9000 | PACK | Freq: Once | INTRAVENOUS | Status: AC | PRN
  Administered 2016-04-27: 31.9 via INTRAVENOUS
  Filled 2016-04-27: qty 32

## 2016-04-27 MED ORDER — TECHNETIUM TC 99M TETROFOSMIN IV KIT
10.7000 | PACK | Freq: Once | INTRAVENOUS | Status: AC | PRN
  Administered 2016-04-27: 11 via INTRAVENOUS
  Filled 2016-04-27: qty 11

## 2016-04-27 MED ORDER — REGADENOSON 0.4 MG/5ML IV SOLN
0.4000 mg | Freq: Once | INTRAVENOUS | Status: AC
  Administered 2016-04-27: 0.4 mg via INTRAVENOUS

## 2016-05-10 ENCOUNTER — Ambulatory Visit: Payer: Medicare Other | Admitting: Cardiology

## 2016-05-11 ENCOUNTER — Encounter: Payer: Self-pay | Admitting: Cardiology

## 2016-05-24 ENCOUNTER — Encounter: Payer: Self-pay | Admitting: Nurse Practitioner

## 2016-05-24 ENCOUNTER — Ambulatory Visit (INDEPENDENT_AMBULATORY_CARE_PROVIDER_SITE_OTHER): Payer: Medicare Other | Admitting: Nurse Practitioner

## 2016-05-24 VITALS — BP 110/60 | HR 83 | Ht 65.0 in | Wt 134.4 lb

## 2016-05-24 DIAGNOSIS — J449 Chronic obstructive pulmonary disease, unspecified: Secondary | ICD-10-CM | POA: Diagnosis not present

## 2016-05-24 DIAGNOSIS — R0602 Shortness of breath: Secondary | ICD-10-CM | POA: Diagnosis not present

## 2016-05-24 DIAGNOSIS — Z09 Encounter for follow-up examination after completed treatment for conditions other than malignant neoplasm: Secondary | ICD-10-CM

## 2016-05-24 DIAGNOSIS — I471 Supraventricular tachycardia: Secondary | ICD-10-CM

## 2016-05-24 DIAGNOSIS — Z9229 Personal history of other drug therapy: Secondary | ICD-10-CM

## 2016-05-24 NOTE — Progress Notes (Signed)
Lori [name], I can see her eventually in CHF clinic since I've been seeing her husband there.  I'm not sure she is going to follow through with any recommendations ultimately though.
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
## 2016-05-24 NOTE — Patient Instructions (Addendum)
We will be checking the following labs today - NONE  Medication Instructions:    Continue with your current medicines.     Testing/Procedures To Be Arranged:  Needs PFTs rescheduled   Follow-Up:   See me in 4 months   Other Special Instructions:   N/A    If you need a refill on your cardiac medications before your next appointment, please call your pharmacy.   Call the [place] office at [phone] if you have any questions, problems or concerns.

## 2016-05-25 ENCOUNTER — Encounter: Payer: Self-pay | Admitting: Cardiology

## 2016-05-26 DIAGNOSIS — M7062 Trochanteric bursitis, left hip: Secondary | ICD-10-CM | POA: Diagnosis not present

## 2016-05-26 DIAGNOSIS — Z6823 Body mass index (BMI) 23.0-23.9, adult: Secondary | ICD-10-CM | POA: Diagnosis not present

## 2016-05-27 ENCOUNTER — Encounter: Payer: Self-pay | Admitting: Cardiology

## 2016-05-30 ENCOUNTER — Other Ambulatory Visit: Payer: Self-pay | Admitting: Internal Medicine

## 2016-05-30 ENCOUNTER — Ambulatory Visit
Admission: RE | Admit: 2016-05-30 | Discharge: 2016-05-30 | Disposition: A | Discharge status: Home / Self Care | Payer: Medicare Other | Source: Ambulatory Visit | Attending: Internal Medicine | Admitting: Internal Medicine

## 2016-05-30 DIAGNOSIS — M5416 Radiculopathy, lumbar region: Secondary | ICD-10-CM

## 2016-05-30 DIAGNOSIS — M5136 Other intervertebral disc degeneration, lumbar region: Secondary | ICD-10-CM | POA: Diagnosis not present

## 2016-05-30 DIAGNOSIS — M810 Age-related osteoporosis without current pathological fracture: Secondary | ICD-10-CM | POA: Diagnosis not present

## 2016-05-30 DIAGNOSIS — I10 Essential (primary) hypertension: Secondary | ICD-10-CM | POA: Diagnosis not present

## 2016-05-30 DIAGNOSIS — M25552 Pain in left hip: Secondary | ICD-10-CM | POA: Diagnosis not present

## 2016-05-30 DIAGNOSIS — E784 Other hyperlipidemia: Secondary | ICD-10-CM | POA: Diagnosis not present

## 2016-05-30 IMAGING — CR DG CHEST 2V
2 series · 2 of 2 positions shown · non-contrast
Comparison: 02/04/2014 and prior chest radiographs dating back to
11/19/2008

CLINICAL DATA: Chest pain for 2 days.

EXAM:
CHEST  2 VIEW

[w chest pa]
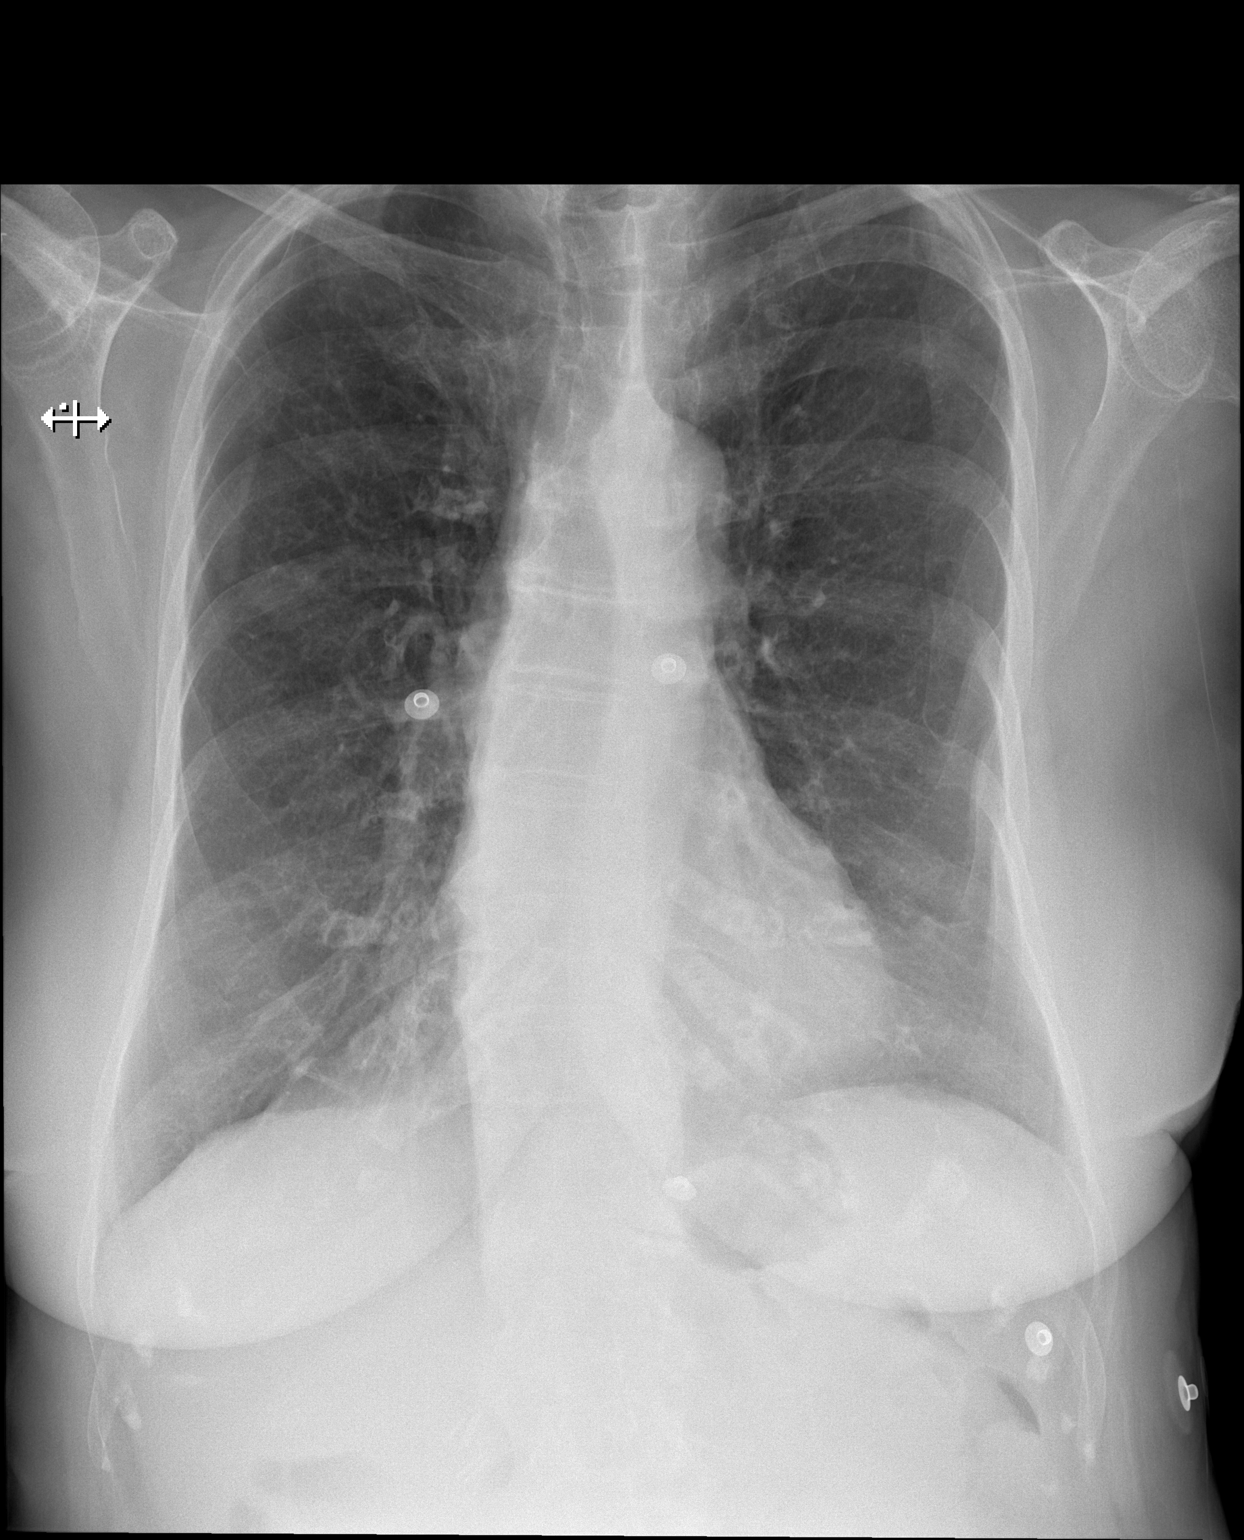

[w chest lat]
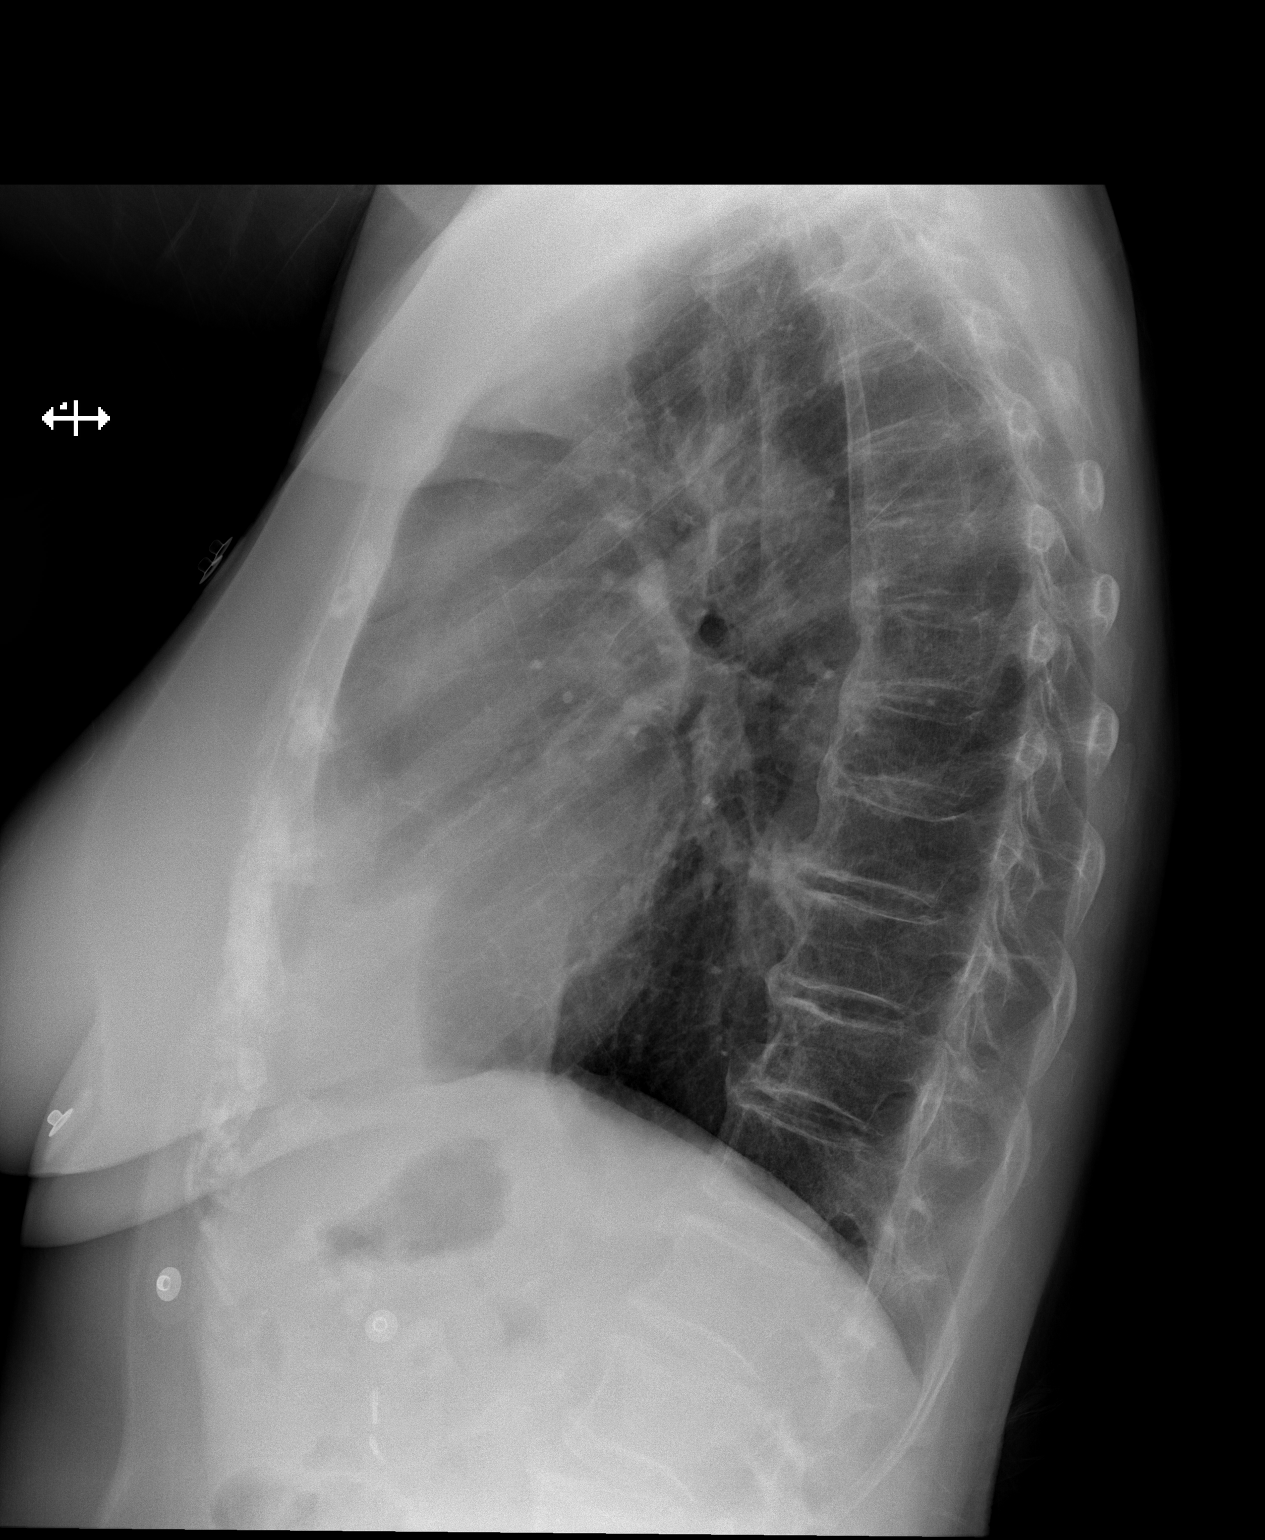

[2 of 2 positions shown; findings below may reference images not displayed]

FINDINGS: The cardiomediastinal silhouette is unremarkable.

There is no evidence of focal airspace disease, pulmonary edema,
suspicious pulmonary nodule/mass, pleural effusion, or pneumothorax.
No acute bony abnormalities are identified. New she
IMPRESSION: No active cardiopulmonary disease.

## 2016-06-06 DIAGNOSIS — M5489 Other dorsalgia: Secondary | ICD-10-CM | POA: Diagnosis not present

## 2016-06-06 DIAGNOSIS — I471 Supraventricular tachycardia: Secondary | ICD-10-CM | POA: Diagnosis not present

## 2016-06-06 DIAGNOSIS — R2681 Unsteadiness on feet: Secondary | ICD-10-CM | POA: Diagnosis not present

## 2016-06-06 DIAGNOSIS — J449 Chronic obstructive pulmonary disease, unspecified: Secondary | ICD-10-CM | POA: Diagnosis not present

## 2016-06-06 DIAGNOSIS — Z23 Encounter for immunization: Secondary | ICD-10-CM | POA: Diagnosis not present

## 2016-06-06 DIAGNOSIS — Z0000 Encounter for general adult medical examination without abnormal findings: Secondary | ICD-10-CM | POA: Diagnosis not present

## 2016-06-06 DIAGNOSIS — Z1389 Encounter for screening for other disorder: Secondary | ICD-10-CM | POA: Diagnosis not present

## 2016-06-28 DIAGNOSIS — M25562 Pain in left knee: Secondary | ICD-10-CM | POA: Diagnosis not present

## 2016-06-28 DIAGNOSIS — M25531 Pain in right wrist: Secondary | ICD-10-CM | POA: Diagnosis not present

## 2016-06-28 DIAGNOSIS — M25552 Pain in left hip: Secondary | ICD-10-CM | POA: Diagnosis not present

## 2016-07-14 DIAGNOSIS — M25552 Pain in left hip: Secondary | ICD-10-CM | POA: Diagnosis not present

## 2016-07-15 DIAGNOSIS — D692 Other nonthrombocytopenic purpura: Secondary | ICD-10-CM | POA: Diagnosis not present

## 2016-07-15 DIAGNOSIS — L570 Actinic keratosis: Secondary | ICD-10-CM | POA: Diagnosis not present

## 2016-07-15 DIAGNOSIS — C44722 Squamous cell carcinoma of skin of right lower limb, including hip: Secondary | ICD-10-CM | POA: Diagnosis not present

## 2016-07-15 DIAGNOSIS — L821 Other seborrheic keratosis: Secondary | ICD-10-CM | POA: Diagnosis not present

## 2016-07-15 DIAGNOSIS — Z85828 Personal history of other malignant neoplasm of skin: Secondary | ICD-10-CM | POA: Diagnosis not present

## 2016-07-28 DIAGNOSIS — Z6822 Body mass index (BMI) 22.0-22.9, adult: Secondary | ICD-10-CM | POA: Diagnosis not present

## 2016-07-28 DIAGNOSIS — J449 Chronic obstructive pulmonary disease, unspecified: Secondary | ICD-10-CM | POA: Diagnosis not present

## 2016-07-28 DIAGNOSIS — M5489 Other dorsalgia: Secondary | ICD-10-CM | POA: Diagnosis not present

## 2016-07-28 DIAGNOSIS — R0609 Other forms of dyspnea: Secondary | ICD-10-CM | POA: Diagnosis not present

## 2016-08-20 ENCOUNTER — Emergency Department (HOSPITAL_COMMUNITY)
Admission: EM | Admit: 2016-08-20 | Discharge: 2016-08-20 | Disposition: A | Discharge status: Home / Self Care | Payer: Medicare Other | Attending: Emergency Medicine | Admitting: Emergency Medicine

## 2016-08-20 ENCOUNTER — Encounter (HOSPITAL_COMMUNITY): Payer: Self-pay

## 2016-08-20 DIAGNOSIS — J449 Chronic obstructive pulmonary disease, unspecified: Secondary | ICD-10-CM | POA: Insufficient documentation

## 2016-08-20 DIAGNOSIS — R1030 Lower abdominal pain, unspecified: Secondary | ICD-10-CM | POA: Diagnosis not present

## 2016-08-20 DIAGNOSIS — Z79899 Other long term (current) drug therapy: Secondary | ICD-10-CM | POA: Insufficient documentation

## 2016-08-20 DIAGNOSIS — I2510 Atherosclerotic heart disease of native coronary artery without angina pectoris: Secondary | ICD-10-CM | POA: Diagnosis not present

## 2016-08-20 DIAGNOSIS — Z96651 Presence of right artificial knee joint: Secondary | ICD-10-CM | POA: Diagnosis not present

## 2016-08-20 DIAGNOSIS — R109 Unspecified abdominal pain: Secondary | ICD-10-CM | POA: Diagnosis present

## 2016-08-20 DIAGNOSIS — I10 Essential (primary) hypertension: Secondary | ICD-10-CM | POA: Diagnosis not present

## 2016-08-20 DIAGNOSIS — Z7982 Long term (current) use of aspirin: Secondary | ICD-10-CM | POA: Diagnosis not present

## 2016-08-20 DIAGNOSIS — Z87891 Personal history of nicotine dependence: Secondary | ICD-10-CM | POA: Insufficient documentation

## 2016-08-20 DIAGNOSIS — K5900 Constipation, unspecified: Secondary | ICD-10-CM | POA: Diagnosis not present

## 2016-08-20 MED ORDER — MAGNESIUM CITRATE PO SOLN
1.0000 | Freq: Once | ORAL | Status: AC
  Administered 2016-08-20: 1 via ORAL
  Filled 2016-08-20: qty 296

## 2016-08-20 MED ORDER — BISACODYL 10 MG RE SUPP
10.0000 mg | Freq: Once | RECTAL | Status: AC
  Administered 2016-08-20: 10 mg via RECTAL
  Filled 2016-08-20: qty 1

## 2016-08-20 NOTE — ED Notes (Signed)
No respiratory or acute distress noted alert and oriented x 3 family at bedside no reaction to medication noted call light in reach. 

## 2016-08-20 NOTE — ED Triage Notes (Signed)
At 1700 pm today with shortness of breath, constipation, chills, from [employer] [facility]. Lives with husband no other complaints no respiratory or acute distress noted. Able to speak in full sentences.
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
## 2016-08-20 NOTE — ED Notes (Signed)
Bed: [id number] Expected date:  Expected time:  Means of arrival:  Comments: EMS 82 yo female chills-right lower abd pain

## 2016-08-20 NOTE — ED Notes (Signed)
Resting in bed with eyes closed call light in reach no reaction to medication noted.

## 2016-08-20 NOTE — Discharge Instructions (Signed)
Take Colace twice a day, for 2 weeks, then once a day for 1 month.   Increase the fiber in your diet.

## 2016-08-24 DIAGNOSIS — R0602 Shortness of breath: Secondary | ICD-10-CM | POA: Diagnosis not present

## 2016-08-24 DIAGNOSIS — R1031 Right lower quadrant pain: Secondary | ICD-10-CM | POA: Diagnosis not present

## 2016-08-24 DIAGNOSIS — R0609 Other forms of dyspnea: Secondary | ICD-10-CM | POA: Diagnosis not present

## 2016-08-24 DIAGNOSIS — N390 Urinary tract infection, site not specified: Secondary | ICD-10-CM | POA: Diagnosis not present

## 2016-08-24 DIAGNOSIS — R8299 Other abnormal findings in urine: Secondary | ICD-10-CM | POA: Diagnosis not present

## 2016-08-24 DIAGNOSIS — R531 Weakness: Secondary | ICD-10-CM | POA: Diagnosis not present

## 2016-08-29 DIAGNOSIS — R5383 Other fatigue: Secondary | ICD-10-CM | POA: Diagnosis not present

## 2016-08-29 DIAGNOSIS — N390 Urinary tract infection, site not specified: Secondary | ICD-10-CM | POA: Diagnosis not present

## 2016-08-29 DIAGNOSIS — R0602 Shortness of breath: Secondary | ICD-10-CM | POA: Diagnosis not present

## 2016-08-29 DIAGNOSIS — Z6823 Body mass index (BMI) 23.0-23.9, adult: Secondary | ICD-10-CM | POA: Diagnosis not present

## 2016-08-29 DIAGNOSIS — I10 Essential (primary) hypertension: Secondary | ICD-10-CM | POA: Diagnosis not present

## 2016-08-31 DIAGNOSIS — D692 Other nonthrombocytopenic purpura: Secondary | ICD-10-CM | POA: Diagnosis not present

## 2016-08-31 DIAGNOSIS — L309 Dermatitis, unspecified: Secondary | ICD-10-CM | POA: Diagnosis not present

## 2016-08-31 DIAGNOSIS — Z85828 Personal history of other malignant neoplasm of skin: Secondary | ICD-10-CM | POA: Diagnosis not present

## 2016-09-09 DIAGNOSIS — R109 Unspecified abdominal pain: Secondary | ICD-10-CM | POA: Diagnosis not present

## 2016-09-14 DIAGNOSIS — R109 Unspecified abdominal pain: Secondary | ICD-10-CM | POA: Diagnosis not present

## 2016-09-14 DIAGNOSIS — N200 Calculus of kidney: Secondary | ICD-10-CM | POA: Diagnosis not present

## 2016-09-22 DIAGNOSIS — R2681 Unsteadiness on feet: Secondary | ICD-10-CM | POA: Diagnosis not present

## 2016-09-22 DIAGNOSIS — J449 Chronic obstructive pulmonary disease, unspecified: Secondary | ICD-10-CM | POA: Diagnosis not present

## 2016-09-22 DIAGNOSIS — R0609 Other forms of dyspnea: Secondary | ICD-10-CM | POA: Diagnosis not present

## 2016-09-22 DIAGNOSIS — Z1389 Encounter for screening for other disorder: Secondary | ICD-10-CM | POA: Diagnosis not present

## 2016-09-22 DIAGNOSIS — R5383 Other fatigue: Secondary | ICD-10-CM | POA: Diagnosis not present

## 2016-09-27 DIAGNOSIS — R5383 Other fatigue: Secondary | ICD-10-CM | POA: Diagnosis not present

## 2016-09-27 DIAGNOSIS — R2681 Unsteadiness on feet: Secondary | ICD-10-CM | POA: Diagnosis not present

## 2016-09-27 DIAGNOSIS — Z6822 Body mass index (BMI) 22.0-22.9, adult: Secondary | ICD-10-CM | POA: Diagnosis not present

## 2016-09-27 DIAGNOSIS — R0609 Other forms of dyspnea: Secondary | ICD-10-CM | POA: Diagnosis not present

## 2016-09-27 DIAGNOSIS — J449 Chronic obstructive pulmonary disease, unspecified: Secondary | ICD-10-CM | POA: Diagnosis not present

## 2016-09-28 ENCOUNTER — Encounter: Payer: Self-pay | Admitting: Nurse Practitioner

## 2016-09-28 ENCOUNTER — Ambulatory Visit: Payer: Medicare Other | Admitting: Nurse Practitioner

## 2016-09-28 ENCOUNTER — Ambulatory Visit (INDEPENDENT_AMBULATORY_CARE_PROVIDER_SITE_OTHER): Payer: Medicare Other | Admitting: Nurse Practitioner

## 2016-09-28 VITALS — BP 126/68 | HR 98 | Ht 65.0 in | Wt 133.4 lb

## 2016-09-28 DIAGNOSIS — R0602 Shortness of breath: Secondary | ICD-10-CM

## 2016-09-28 DIAGNOSIS — I471 Supraventricular tachycardia: Secondary | ICD-10-CM

## 2016-09-28 MED ORDER — DILTIAZEM HCL ER COATED BEADS 180 MG PO CP24: 180.0000 mg | ORAL_CAPSULE | Freq: Every day | ORAL | 6 refills | Status: DC

## 2016-09-28 NOTE — Patient Instructions (Addendum)
We will be checking the following labs today - BMET, CBC, pro BNP   Medication Instructions:    Follow this list of medicines  I am sending in a new prescription for Diltiazem 180 mg - to take one EVERY DAY - START TODAY    Testing/Procedures To Be Arranged:  N/A  Follow-Up:   See me next week with EKG    Other Special Instructions:   N/A    If you need a refill on your cardiac medications before your next appointment, please call your pharmacy.   Call the [place] office at [phone] if you have any questions, problems or concerns.
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
## 2016-09-29 DIAGNOSIS — N201 Calculus of ureter: Secondary | ICD-10-CM | POA: Diagnosis not present

## 2016-09-29 DIAGNOSIS — N390 Urinary tract infection, site not specified: Secondary | ICD-10-CM | POA: Diagnosis not present

## 2016-09-29 DIAGNOSIS — R109 Unspecified abdominal pain: Secondary | ICD-10-CM | POA: Diagnosis not present

## 2016-09-29 DIAGNOSIS — N3020 Other chronic cystitis without hematuria: Secondary | ICD-10-CM | POA: Diagnosis not present

## 2016-09-29 LAB — CBC
Hematocrit: 28.5 % — ABNORMAL LOW (ref 34.0–46.6)
Hemoglobin: 9.6 g/dL — ABNORMAL LOW (ref 11.1–15.9)
MCH: 36 pg — ABNORMAL HIGH (ref 26.6–33.0)
MCHC: 33.7 g/dL (ref 31.5–35.7)
MCV: 107 fL — ABNORMAL HIGH (ref 79–97)
Platelets: 748 10*3/uL — ABNORMAL HIGH (ref 150–379)
RBC: 2.67 x10E6/uL — CL (ref 3.77–5.28)
RDW: 17.2 % — ABNORMAL HIGH (ref 12.3–15.4)
WBC: 2 10*3/uL — CL (ref 3.4–10.8)

## 2016-09-29 LAB — BASIC METABOLIC PANEL
BUN/Creatinine Ratio: 30 — ABNORMAL HIGH (ref 12–28)
BUN: 18 mg/dL (ref 8–27)
CO2: 23 mmol/L (ref 18–29)
Calcium: 9.1 mg/dL (ref 8.7–10.3)
Chloride: 101 mmol/L (ref 96–106)
Creatinine, Ser: 0.6 mg/dL (ref 0.57–1.00)
GFR calc Af Amer: 94 mL/min/{1.73_m2} (ref >59)
GFR calc non Af Amer: 82 mL/min/{1.73_m2} (ref >59)
Glucose: 116 mg/dL — ABNORMAL HIGH (ref 65–99)
Potassium: 4.4 mmol/L (ref 3.5–5.2)
Sodium: 141 mmol/L (ref 134–144)

## 2016-09-29 LAB — PRO B NATRIURETIC PEPTIDE: NT-Pro BNP: 502 pg/mL (ref 0–738)

## 2016-10-03 ENCOUNTER — Encounter: Payer: Self-pay | Admitting: Oncology

## 2016-10-03 ENCOUNTER — Telehealth: Payer: Self-pay | Admitting: Oncology
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
## 2016-10-03 NOTE — Telephone Encounter (Signed)
Appt scheduled w/Dr. Talbert Cage on 1/23 at 11am. Scheduled appt w/the pt's husband. Agreed appt date and time to the appt time. Demographics verified.

## 2016-10-04 ENCOUNTER — Ambulatory Visit (INDEPENDENT_AMBULATORY_CARE_PROVIDER_SITE_OTHER): Payer: Medicare Other | Admitting: Nurse Practitioner

## 2016-10-04 ENCOUNTER — Encounter: Payer: Self-pay | Admitting: Nurse Practitioner

## 2016-10-04 VITALS — BP 128/60 | HR 106 | Ht 65.0 in | Wt 135.8 lb

## 2016-10-04 DIAGNOSIS — I471 Supraventricular tachycardia: Secondary | ICD-10-CM

## 2016-10-04 DIAGNOSIS — R0602 Shortness of breath: Secondary | ICD-10-CM | POA: Diagnosis not present

## 2016-10-04 MED ORDER — VALSARTAN-HYDROCHLOROTHIAZIDE 80-12.5 MG PO TABS: 1.0000 | ORAL_TABLET | Freq: Every day | ORAL | 6 refills | Status: DC

## 2016-10-04 MED ORDER — DILTIAZEM HCL ER COATED BEADS 240 MG PO CP24: 240.0000 mg | ORAL_CAPSULE | Freq: Every day | ORAL | 3 refills | Status: DC

## 2016-10-04 NOTE — Patient Instructions (Addendum)
We will be checking the following labs today - NONE   Medication Instructions:    Continue with your current medicines. BUT  I have changed the dose of your Diltiazem - we will be increasing this to 240 mg each day  I am cutting back your dose of Diovan Hct back to 80/12.5 mg daily  These are at the drug store.     Testing/Procedures To Be Arranged:  N/A  Follow-Up:   See me in 2 weeks with an EKG  See hematology next week  Follow up with Dr. Philip Aspen as planned    Other Special Instructions:   N/A    If you need a refill on your cardiac medications before your next appointment, please call your pharmacy.   Call the [facility] office at [phone] if you have any questions, problems or concerns.

## 2016-10-11 ENCOUNTER — Ambulatory Visit (HOSPITAL_BASED_OUTPATIENT_CLINIC_OR_DEPARTMENT_OTHER): Payer: Medicare Other | Admitting: Oncology

## 2016-10-11 ENCOUNTER — Telehealth: Payer: Self-pay | Admitting: Oncology

## 2016-10-11 ENCOUNTER — Ambulatory Visit (HOSPITAL_BASED_OUTPATIENT_CLINIC_OR_DEPARTMENT_OTHER): Payer: Medicare Other

## 2016-10-11 ENCOUNTER — Encounter: Payer: Self-pay | Admitting: Oncology

## 2016-10-11 VITALS — BP 143/59 | HR 93 | Temp 97.5°F | Resp 17 | Ht 65.0 in | Wt 132.7 lb

## 2016-10-11 DIAGNOSIS — R0609 Other forms of dyspnea: Secondary | ICD-10-CM | POA: Diagnosis not present

## 2016-10-11 DIAGNOSIS — D473 Essential (hemorrhagic) thrombocythemia: Secondary | ICD-10-CM

## 2016-10-11 DIAGNOSIS — J449 Chronic obstructive pulmonary disease, unspecified: Secondary | ICD-10-CM | POA: Diagnosis not present

## 2016-10-11 DIAGNOSIS — D75839 Thrombocytosis, unspecified: Secondary | ICD-10-CM

## 2016-10-11 DIAGNOSIS — Z6822 Body mass index (BMI) 22.0-22.9, adult: Secondary | ICD-10-CM | POA: Diagnosis not present

## 2016-10-11 DIAGNOSIS — D649 Anemia, unspecified: Secondary | ICD-10-CM

## 2016-10-11 DIAGNOSIS — M79606 Pain in leg, unspecified: Secondary | ICD-10-CM | POA: Diagnosis not present

## 2016-10-11 DIAGNOSIS — R5382 Chronic fatigue, unspecified: Secondary | ICD-10-CM

## 2016-10-11 DIAGNOSIS — D708 Other neutropenia: Secondary | ICD-10-CM | POA: Diagnosis not present

## 2016-10-11 LAB — IRON AND TIBC
%SAT: 43 % (ref 21–57)
Iron: 114 ug/dL (ref 41–142)
TIBC: 266 ug/dL (ref 236–444)
UIBC: 153 ug/dL (ref 120–384)

## 2016-10-11 LAB — CBC & DIFF AND RETIC
BASO%: 0 % (ref 0.0–2.0)
Basophils Absolute: 0 10*3/uL (ref 0.0–0.1)
EOS%: 1.5 % (ref 0.0–7.0)
Eosinophils Absolute: 0 10*3/uL (ref 0.0–0.5)
HEMATOCRIT: 28.2 % — AB (ref 34.8–46.6)
HGB: 9.3 g/dL — ABNORMAL LOW (ref 11.6–15.9)
Immature Retic Fract: 7.8 % (ref 1.60–10.00)
LYMPH%: 62.2 % — AB (ref 14.0–49.7)
MCH: 35.6 pg — ABNORMAL HIGH (ref 25.1–34.0)
MCHC: 33 g/dL (ref 31.5–36.0)
MCV: 108 fL — AB (ref 79.5–101.0)
MONO#: 0.1 10*3/uL (ref 0.1–0.9)
MONO%: 3.1 % (ref 0.0–14.0)
NEUT%: 33.2 % — AB (ref 38.4–76.8)
NEUTROS ABS: 0.7 10*3/uL — AB (ref 1.5–6.5)
RBC: 2.61 10*6/uL — AB (ref 3.70–5.45)
RDW: 16.1 % — ABNORMAL HIGH (ref 11.2–14.5)
RETIC CT ABS: 59.51 10*3/uL (ref 33.70–90.70)
Retic %: 2.28 % — ABNORMAL HIGH (ref 0.70–2.10)
WBC: 2 10*3/uL — ABNORMAL LOW (ref 3.9–10.3)
lymph#: 1.2 10*3/uL (ref 0.9–3.3)
nRBC: 0 % (ref 0–0)

## 2016-10-11 LAB — MORPHOLOGY: PLT EST: INCREASED

## 2016-10-11 LAB — FERRITIN: FERRITIN: 252 ng/mL (ref 9–269)

## 2016-10-11 LAB — TSH: TSH: 1.093 m[IU]/L (ref 0.308–3.960)

## 2016-10-11 LAB — LACTATE DEHYDROGENASE: LDH: 165 U/L (ref 125–245)

## 2016-10-11 NOTE — Telephone Encounter (Signed)
Appointments scheduled per 1/23 LOS. Patient given AVS report and calendars with future scheduled appointments. °

## 2016-10-11 NOTE — Progress Notes (Signed)
Lori [name]:  Patient Care Team: Leanna Battles, MD as PCP - General (Internal Medicine) Brand Males, MD as Consulting Physician (Pulmonary Disease) [name], MD as Consulting Physician (Orthopedic Surgery)  CHIEF COMPLAINTS/PURPOSE OF CONSULTATION:  Leukopenia  HISTORY OF PRESENTING ILLNESS: Lori [name] 83 y.o. female is here for evaluation of leukopenia. Patient states that she's been having severe fatigue since November. She was having trouble with her atrial fibrillation again, however this has improved. CBC from 12/617 demonstrated WBC 4.83K, hemoglobin 10.9 g/dL, hematocrit 31.8%, MCV 105.4, place a count 764K. CBC from 04/11/16 demonstrated WBC 4.1K, hemoglobin 12.5 g/dL, hematocrit 38.1%, platelet count 693K. Most recent CBC from 09/28/16 demonstrated worsening of her leukopenia and anemia with WBC 2K, hemoglobin 9.6 g/dL, hematocrit 28.5%, MCV 107, platelet count 748K. She also has diarrhea about 3-4 times a day, however she states that this is been a lifelong problem for her. She has lost about 5 pounds in the past 2 months. She also has occasional shortness of breath. She denies any recent changes in her medications.  Review of Systems  Constitutional: Positive for fatigue. Negative for appetite change and fever.       Weight loss 5 lbs.  HENT:   Negative for lump/mass, nosebleeds, sore throat and tinnitus.   Eyes: Negative.   Respiratory: Positive for shortness of breath and wheezing. Negative for chest tightness, cough and hemoptysis.   Cardiovascular: Negative.   Gastrointestinal: Positive for diarrhea. Negative for abdominal pain, blood in stool, nausea and vomiting.  Endocrine: Negative.   Genitourinary: Negative.    Musculoskeletal: Negative.   Skin: Negative.   Neurological: Negative.   Hematological: Negative.   Psychiatric/Behavioral: The patient is nervous/anxious.     MEDICAL HISTORY: Past Medical History:  Diagnosis  Date  . ALLERGIC RHINITIS   . Anxiety   . Asthma   . Atrial fibrillation ([place])   . Back pain of thoracolumbar region    right  . Bronchiectasis   . Colles' fracture of left radius   . COPD (chronic obstructive pulmonary disease) ([place])   . Coronary artery disease   . Diastolic dysfunction   . Diverticulosis   . Dysrhythmia   . History of pneumonia   . Hx of cardiovascular stress test    a. ETT-MV 1/14: Exercised 4:16, no ECG changes, poor ex tol, ant defect c/w soft tissue atten, no ischemia, EF 75%  . Hx of colonic polyp   . Hypertension   . IBS (irritable bowel syndrome)   . Insomnia   . Multiple rib fractures 10/2008   bilateral  . Pleural effusion, left   . Pruritus   . Pulmonary nodule     SURGICAL HISTORY: Past Surgical History:  Procedure Laterality Date  . ANKLE CLOSED REDUCTION Right 12/11/2014   Procedure: CLOSED REDUCTION ANKLE;  Surgeon: Rod Can, MD;  Location: [facility];  Service: Orthopedics;  Laterality: Right;  . APPENDECTOMY    . BLADDER SUSPENSION  2005   A-P  . CHOLECYSTECTOMY    . COLONOSCOPY  04/06/2012   Procedure: COLONOSCOPY;  Surgeon: Jerene Bears, MD;  Location: [facility];  Service: Gastroenterology;  Laterality: N/A;  . EXTERNAL FIXATION LEG Right 12/12/2014   Procedure: ADJUSTMENT OF EXTERNAL FIXATION  RIGHT ANKLE;  Surgeon: Rod Can, MD;  Location: [facility];  Service: Orthopedics;  Laterality: Right;  . EXTERNAL FIXATION LEG Right 12/11/2014   Procedure: POSSIBLE EXTERNAL FIXATION RIGHT ANKLE;  Surgeon: Rod Can, MD;  Location: [facility];  Service: Orthopedics;  Laterality: Right;  . HARDWARE REMOVAL Right 12/25/2014   Procedure: RIGHT ANKLE REMOVE OF DEEP IMPLANT ANE EXTERNAL FIXATOR ;  Surgeon: [name], MD;  Location: [facility];  Service: Orthopedics;  Laterality: Right;  . ORIF ANKLE FRACTURE Right 12/25/2014   Procedure: OPEN REDUCTION INTERNAL FIXATION (ORIF)TRIMALLEOLAR  ANKLE FRACTURE;  Surgeon: [name], MD;   Location: [facility];  Service: Orthopedics;  Laterality: Right;  . ORIF WRIST FRACTURE Left 2010     Dr. Burney Gauze.  Marland Kitchen POLYPECTOMY    . ROTATOR CUFF REPAIR Right 2006  . TONSILLECTOMY    . TONSILLECTOMY    . TOTAL KNEE ARTHROPLASTY Right 08/2010    SOCIAL HISTORY: Social History   Social History  . Marital status: Married    Spouse name: N/A  . Number of children: 3  . Years of education: N/A   Occupational History  . retired Retired   Social History Main Topics  . Smoking status: Former Smoker    Packs/day: 0.30    Years: 10.00    Types: Cigarettes    Quit date: 09/19/1948  . Smokeless tobacco: Never Used  . Alcohol use No     Comment: rarely  . Drug use: No  . Sexual activity: No   Other Topics Concern  . Not on file   Social History Narrative   [place]; [place]. Married 1959 - spouse s/p valve surgery with resulting paralysis hemidiaphragm ('10). 3 daughter, 5 grandsons, 4 granddaughters.  1 daughter bipolar - social issues. She remains very active, but can't play tennis. Pt was apprently ex-smoker, but husband does not recollect pt ever smoking. Stressful move to smaller quarters (fall '09) and now moving to [place] (fall '12).      ACP - she clearly states No CPR, no mechanical ventilation and that quality of life is of key importance. Provided MOST sample, directed to [email or web address] and provided an Out of Facility Order. (Jan '14)    FAMILY HISTORY Family History  Problem Relation Age of Onset  . Heart disease Mother   . Rheumatic fever Mother   . Bipolar disorder Daughter   . Pneumonia Father   . Colon cancer Neg Hx     ALLERGIES:  is allergic to albuterol sulfate; levaquin [levofloxacin in d5w]; doxycycline; lactose intolerance (gi); amoxicillin; codeine; penicillins; sulfa antibiotics; and sulfonamide derivatives.  MEDICATIONS:  Current Outpatient Prescriptions  Medication Sig Dispense Refill  .  aspirin EC 81 MG tablet Take 1 tablet (81 mg total) by mouth daily. 90 tablet 3  . bimatoprost (LUMIGAN) 0.01 % SOLN Place 1 drop into both eyes daily. 5 mL 3  . budesonide-formoterol (SYMBICORT) 160-4.5 MCG/ACT inhaler Inhale 2 puffs into the lungs as needed (allergies).    . clindamycin (CLEOCIN) 300 MG capsule Take 600 mg by mouth once. For dental propcedures    . dicyclomine (BENTYL) 10 MG capsule Take 1 capsule (10 mg total) by mouth 3 (three) times daily as needed. IBS 90 capsule 3  . diltiazem (CARDIZEM CD) 240 MG 24 hr capsule Take 1 capsule (240 mg total) by mouth daily. 30 capsule 3  . loperamide (IMODIUM) 2 MG capsule Take 2 mg by mouth as needed for diarrhea or loose stools.     . Magnesium Oxide (MAGOX 400 PO) Take 1 tablet by mouth daily as needed (leg cramps).    . valsartan-hydrochlorothiazide (DIOVAN-HCT) 80-12.5 MG tablet Take 1 tablet by  mouth daily. 30 tablet 6  . zolpidem (AMBIEN) 5 MG tablet Take 1 tablet (5 mg total) by mouth at bedtime as needed for sleep. 30 tablet 5  . Eluxadoline (VIBERZI) 100 MG TABS Take 100 mg by mouth 2 (two) times daily with a meal. (Patient not taking: Reported on 10/11/2016) 60 tablet 3   No current facility-administered medications for this [name].     PHYSICAL EXAMINATION:  ECOG PERFORMANCE STATUS: 1 - Symptomatic but completely ambulatory   Vitals:   10/11/16 1112  BP: (!) 143/59  Pulse: 93  Resp: 17  Temp: 97.5 F (36.4 C)    Filed Weights   10/11/16 1112  Weight: 132 lb 11.2 oz (60.2 kg)     Physical Exam  Constitutional: She is oriented to person, place, and time and well-developed, well-nourished, and in no distress. No distress.  HENT:  Head: Normocephalic and atraumatic.  Mouth/Throat: No oropharyngeal exudate.  Eyes: Conjunctivae are normal. Pupils are equal, round, and reactive to light. No scleral icterus.  Neck: Normal range of motion. Neck supple. No JVD present.  Cardiovascular: Normal heart sounds.  An  irregularly irregular rhythm present. Exam reveals no gallop and no friction rub.   No murmur heard. Pulmonary/Chest: No respiratory distress. She has wheezes. She has no rales.  Abdominal: Soft. Bowel sounds are normal. She exhibits no distension. There is no tenderness. There is no guarding.  Musculoskeletal: She exhibits no edema or tenderness.  Lymphadenopathy:    She has no cervical adenopathy.  Neurological: She is alert and oriented to person, place, and time. No cranial nerve deficit.  Skin: Skin is warm and dry. No rash noted. No erythema. No pallor.  Psychiatric: Affect and judgment normal.     LABORATORY DATA: I have personally reviewed the data as listed:  Office [name] on 09/28/2016  Component Date Value Ref Range Status  . Glucose 09/28/2016 116* 65 - 99 mg/dL Final  . BUN 09/28/2016 18  8 - 27 mg/dL Final  . Creatinine, Ser 09/28/2016 0.60  0.57 - 1.00 mg/dL Final  . GFR calc non Af Amer 09/28/2016 82  >59 mL/min/1.73 Final  . GFR calc Af Amer 09/28/2016 94  >59 mL/min/1.73 Final  . BUN/Creatinine Ratio 09/28/2016 30* 12 - 28 Final  . Sodium 09/28/2016 141  134 - 144 mmol/L Final  . Potassium 09/28/2016 4.4  3.5 - 5.2 mmol/L Final  . Chloride 09/28/2016 101  96 - 106 mmol/L Final  . CO2 09/28/2016 23  18 - 29 mmol/L Final  . Calcium 09/28/2016 9.1  8.7 - 10.3 mg/dL Final  . WBC 09/28/2016 2.0* 3.4 - 10.8 x10E3/uL Final  . RBC 09/28/2016 2.67* 3.77 - 5.28 x10E6/uL Final  . Hemoglobin 09/28/2016 9.6* 11.1 - 15.9 g/dL Final  . Hematocrit 09/28/2016 28.5* 34.0 - 46.6 % Final  . MCV 09/28/2016 107* 79 - 97 fL Final  . MCH 09/28/2016 36.0* 26.6 - 33.0 pg Final  . MCHC 09/28/2016 33.7  31.5 - 35.7 g/dL Final  . RDW 09/28/2016 17.2* 12.3 - 15.4 % Final  . Platelets 09/28/2016 748* 150 - 379 x10E3/uL Final  . NT-Pro BNP 09/28/2016 502  0 - 738 pg/mL Final   Comment: The following cut-points have been suggested for the use of proBNP for the diagnostic evaluation of  heart failure (HF) in patients with acute dyspnea: Modality                     Age  Optimal Cut                            (years)            Point ------------------------------------------------------ Diagnosis (rule in HF)        <50            450 pg/mL                           50 - 75            900 pg/mL                               >75           1800 pg/mL Exclusion (rule out HF)  Age independent     300 pg/mL     RADIOGRAPHIC STUDIES: I have personally reviewed the radiological images as listed and agree with the findings in the report  No results found.  ASSESSMENT/PLAN 83 year old female presents today for evaluation of leukopenia and macrocytic anemia. She has a thrombocytosis as well.  PLAN: I am concerned about possible underlying myelodysplastic syndrome which may be causing her bicytopenia. Macrocytic anemia as can be seen in the setting of MDS as well as vitamin B12/folate deficiencies. I will perform a workup with labs as stated below. Return to clinic in 2 weeks to review labs and to discuss next plan of care.  Orders Placed This Encounter  Procedures  . CBC & Diff and Retic    Standing Status:   Future    Number of Occurrences:   1    Standing Expiration Date:   10/11/2017  . Morphology    Standing Status:   Future    Number of Occurrences:   1    Standing Expiration Date:   10/11/2017  . Myelodysplastic Syndrome (MDS) Panel    Standing Status:   Future    Number of Occurrences:   1    Standing Expiration Date:   10/11/2017  . Erythropoietin    Standing Status:   Future    Number of Occurrences:   1    Standing Expiration Date:   10/11/2017  . Ferritin    Standing Status:   Future    Number of Occurrences:   1    Standing Expiration Date:   10/11/2017  . Iron and TIBC    Standing Status:   Future    Number of Occurrences:   1    Standing Expiration Date:   10/11/2017  . Vitamin B12    Standing Status:   Future    Number of Occurrences:   1     Standing Expiration Date:   10/11/2017  . Haptoglobin    Standing Status:   Future    Number of Occurrences:   1    Standing Expiration Date:   10/11/2017  . Hemoglobinopathy evaluation    Standing Status:   Future    Number of Occurrences:   1    Standing Expiration Date:   10/11/2017  . Lactate dehydrogenase (LDH)    Standing Status:   Future    Number of Occurrences:   1    Standing Expiration Date:   10/11/2017  . Methylmalonic acid, serum    Standing Status:   Future    Number of  Occurrences:   1    Standing Expiration Date:   10/11/2017  . Folate RBC    Standing Status:   Future    Number of Occurrences:   1    Standing Expiration Date:   10/11/2017  . TSH    Standing Status:   Future    Number of Occurrences:   1    Standing Expiration Date:   10/11/2017  . SPEP with reflex to IFE    Standing Status:   Future    Number of Occurrences:   1    Standing Expiration Date:   10/11/2017  . Copper, serum    Standing Status:   Future    Number of Occurrences:   1    Standing Expiration Date:   10/11/2017    All questions were answered. The patient knows to call the clinic with any problems, questions or concerns.  This note was electronically signed.    Twana First, MD  10/11/2016 12:40 PM

## 2016-10-12 LAB — FOLATE RBC
Folate, Hemolysate: 419.1 ng/mL
Folate, RBC: 1486 ng/mL (ref >498)
HEMATOCRIT: 28.2 % — AB (ref 34.0–46.6)

## 2016-10-12 LAB — ERYTHROPOIETIN: Erythropoietin: 75.1 m[IU]/mL — ABNORMAL HIGH (ref 2.6–18.5)

## 2016-10-12 LAB — HAPTOGLOBIN: Haptoglobin: 197 mg/dL (ref 34–200)

## 2016-10-12 LAB — VITAMIN B12: VITAMIN B 12: 776 pg/mL (ref 232–1245)

## 2016-10-13 LAB — HEMOGLOBINOPATHY EVALUATION
HEMOGLOBIN A2 QUANTITATION: 2.2 % (ref 1.8–3.2)
HEMOGLOBIN F QUANTITATION: 5.8 % — AB (ref 0.0–2.0)
HGB A: 92 % — AB (ref 96.4–98.8)
HGB C: 0 %
HGB S: 0 %
HGB VARIANT: 0 %

## 2016-10-13 LAB — COPPER, SERUM: Copper: 157 ug/dL (ref 72–166)

## 2016-10-13 LAB — METHYLMALONIC ACID, SERUM: Methylmalonic Acid, Serum: 474 nmol/L — ABNORMAL HIGH (ref 0–378)

## 2016-10-17 DIAGNOSIS — R262 Difficulty in walking, not elsewhere classified: Secondary | ICD-10-CM | POA: Diagnosis not present

## 2016-10-17 DIAGNOSIS — M79605 Pain in left leg: Secondary | ICD-10-CM | POA: Diagnosis not present

## 2016-10-17 DIAGNOSIS — R2681 Unsteadiness on feet: Secondary | ICD-10-CM | POA: Diagnosis not present

## 2016-10-17 DIAGNOSIS — M6281 Muscle weakness (generalized): Secondary | ICD-10-CM | POA: Diagnosis not present

## 2016-10-17 LAB — PROTEIN ELECTROPHORESIS, SERUM, WITH REFLEX
A/G RATIO SPE: 1.2 (ref 0.7–1.7)
ALBUMIN: 3.5 g/dL (ref 2.9–4.4)
Alpha 1: 0.4 g/dL (ref 0.0–0.4)
Alpha 2: 0.9 g/dL (ref 0.4–1.0)
BETA: 0.9 g/dL (ref 0.7–1.3)
Gamma Globulin: 0.8 g/dL (ref 0.4–1.8)
Globulin, Total: 3 g/dL (ref 2.2–3.9)
IGA/IMMUNOGLOBULIN A, SERUM: 144 mg/dL (ref 64–422)
IGG (IMMUNOGLOBIN G), SERUM: 639 mg/dL — AB (ref 700–1600)
IgM, Qn, Serum: 380 mg/dL — ABNORMAL HIGH (ref 26–217)
Interpretation(See Below): 0
Total Protein: 6.5 g/dL (ref 6.0–8.5)

## 2016-10-18 DIAGNOSIS — H401133 Primary open-angle glaucoma, bilateral, severe stage: Secondary | ICD-10-CM | POA: Diagnosis not present

## 2016-10-18 DIAGNOSIS — H5201 Hypermetropia, right eye: Secondary | ICD-10-CM | POA: Diagnosis not present
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
## 2016-10-19 ENCOUNTER — Ambulatory Visit (INDEPENDENT_AMBULATORY_CARE_PROVIDER_SITE_OTHER): Payer: Medicare Other | Admitting: Nurse Practitioner

## 2016-10-19 ENCOUNTER — Encounter: Payer: Self-pay | Admitting: Nurse Practitioner

## 2016-10-19 VITALS — BP 140/70 | HR 72 | Ht 65.0 in | Wt 133.1 lb

## 2016-10-19 DIAGNOSIS — R0600 Dyspnea, unspecified: Secondary | ICD-10-CM

## 2016-10-19 DIAGNOSIS — R0602 Shortness of breath: Secondary | ICD-10-CM | POA: Diagnosis not present

## 2016-10-19 DIAGNOSIS — R0609 Other forms of dyspnea: Secondary | ICD-10-CM

## 2016-10-19 DIAGNOSIS — I471 Supraventricular tachycardia: Secondary | ICD-10-CM

## 2016-10-19 NOTE — Patient Instructions (Addendum)
We will be checking the following labs today - NONE   Medication Instructions:    Continue with your current medicines.     Testing/Procedures To Be Arranged:  N/A  Follow-Up:   See me in 3 months    Other Special Instructions:   N/A    If you need a refill on your cardiac medications before your next appointment, please call your pharmacy.   Call the [facility] office at [phone] if you have any questions, problems or concerns.      

## 2016-10-21 LAB — MYELODYSPLASTIC SYNDROME (MDS) PANEL

## 2016-10-24 DIAGNOSIS — M79605 Pain in left leg: Secondary | ICD-10-CM | POA: Diagnosis not present

## 2016-10-24 DIAGNOSIS — M6281 Muscle weakness (generalized): Secondary | ICD-10-CM | POA: Diagnosis not present

## 2016-10-24 DIAGNOSIS — R262 Difficulty in walking, not elsewhere classified: Secondary | ICD-10-CM | POA: Diagnosis not present

## 2016-10-24 DIAGNOSIS — R2681 Unsteadiness on feet: Secondary | ICD-10-CM | POA: Diagnosis not present

## 2016-10-25 ENCOUNTER — Telehealth: Payer: Self-pay | Admitting: Hematology

## 2016-10-25 NOTE — Progress Notes (Signed)
[place]  [phone] [phone]  Clinic Follow up Note   Patient Care Team: Leanna Battles, MD as PCP - General (Internal Medicine) Brand Males, MD as Consulting Physician (Pulmonary Disease) [name], MD as Consulting Physician (Orthopedic Surgery) 10/27/2016  CHIEF COMPLAIN: fatigue and dyspnea on exertion  HISTORY OF PRESENT ILLNESS (From Dr. Laverle Patter note on 10/11/2016): Lori [name] 83 y.o. female is here for evaluation of leukopenia. Patient states that she's been having severe fatigue since November 2017. She was having trouble with her atrial fibrillation again, however this has improved. CBC from 12/617 demonstrated WBC 4.83K, hemoglobin 10.9 g/dL, hematocrit 31.8%, MCV 105.4, place a count 764K. CBC from 04/11/16 demonstrated WBC 4.1K, hemoglobin 12.5 g/dL, hematocrit 38.1%, platelet count 693K. Most recent CBC from 09/28/16 demonstrated worsening of her leukopenia and anemia with WBC 2K, hemoglobin 9.6 g/dL, hematocrit 28.5%, MCV 107, platelet count 748K. She also has diarrhea about 3-4 times a day, however she states that this is been a lifelong problem for her. She has lost about 5 pounds in the past 2 months. She also has occasional shortness of breath. She denies any recent changes in her medications.  INTERVAL HISTORY: She returns for follow up. She is very weak. She almost fell while coming to her appointment today. She was always very active with golf, tennis, social events, etc. until Thanksgiving 2017 when the weakness and fatigue started. She has lost some of her appetite and has lost some weight recently. She had never had much blood work done until she went to see her cardiologist. She has a lot of hearing loss in both ears. The left is worse than the right. Denies fever, infection, chest pain, night sweats, or any other concerns. She use to smoke but hasn't in years.    REVIEW OF SYSTEMS:   Constitutional: Denies fevers, chills  or abnormal weight loss (+) lower extremity weakness, fatigue, loss of appetite, weight loss (5 lbs) Eyes: Denies blurriness of vision Ears, nose, mouth, throat, and face: Denies mucositis or sore throat (+) hearing loss Respiratory: Denies cough, dyspnea or wheezes Cardiovascular: Denies palpitation, chest discomfort or lower extremity swelling Gastrointestinal:  Denies nausea, heartburn or change in bowel habits Skin: Denies abnormal skin rashes Lymphatics: Denies new lymphadenopathy or easy bruising Neurological:Denies numbness, tingling or new weaknesses Behavioral/Psych: Mood is stable, no new changes  All other systems were reviewed with the patient and are negative.  MEDICAL HISTORY:  Past Medical History:  Diagnosis Date  . ALLERGIC RHINITIS   . Anxiety   . Asthma   . Atrial fibrillation ([place])   . Back pain of thoracolumbar region    right  . Bronchiectasis   . Colles' fracture of left radius   . COPD (chronic obstructive pulmonary disease) ([place])   . Coronary artery disease   . Diastolic dysfunction   . Diverticulosis   . Dysrhythmia   . History of pneumonia   . Hx of cardiovascular stress test    a. ETT-MV 1/14: Exercised 4:16, no ECG changes, poor ex tol, ant defect c/w soft tissue atten, no ischemia, EF 75%  . Hx of colonic polyp   . Hypertension   . IBS (irritable bowel syndrome)   . Insomnia   . Multiple rib fractures 10/2008   bilateral  . Pleural effusion, left   . Pruritus   . Pulmonary nodule     SURGICAL HISTORY: Past Surgical History:  Procedure Laterality Date  . ANKLE CLOSED REDUCTION Right 12/11/2014  Procedure: CLOSED REDUCTION ANKLE;  Surgeon: Rod Can, MD;  Location: [facility];  Service: Orthopedics;  Laterality: Right;  . APPENDECTOMY    . BLADDER SUSPENSION  2005   A-P  . CHOLECYSTECTOMY    . COLONOSCOPY  04/06/2012   Procedure: COLONOSCOPY;  Surgeon: Jerene Bears, MD;  Location: [facility];  Service: Gastroenterology;  Laterality: N/A;   . EXTERNAL FIXATION LEG Right 12/12/2014   Procedure: ADJUSTMENT OF EXTERNAL FIXATION  RIGHT ANKLE;  Surgeon: Rod Can, MD;  Location: [facility];  Service: Orthopedics;  Laterality: Right;  . EXTERNAL FIXATION LEG Right 12/11/2014   Procedure: POSSIBLE EXTERNAL FIXATION RIGHT ANKLE;  Surgeon: Rod Can, MD;  Location: [facility];  Service: Orthopedics;  Laterality: Right;  . HARDWARE REMOVAL Right 12/25/2014   Procedure: RIGHT ANKLE REMOVE OF DEEP IMPLANT ANE EXTERNAL FIXATOR ;  Surgeon: [name], MD;  Location: [facility];  Service: Orthopedics;  Laterality: Right;  . ORIF ANKLE FRACTURE Right 12/25/2014   Procedure: OPEN REDUCTION INTERNAL FIXATION (ORIF)TRIMALLEOLAR  ANKLE FRACTURE;  Surgeon: [name], MD;  Location: [facility];  Service: Orthopedics;  Laterality: Right;  . ORIF WRIST FRACTURE Left 2010     Dr. Burney Gauze.  Marland Kitchen POLYPECTOMY    . ROTATOR CUFF REPAIR Right 2006  . TONSILLECTOMY    . TONSILLECTOMY    . TOTAL KNEE ARTHROPLASTY Right 08/2010    I have reviewed the social history and family history with the patient and they are unchanged from previous note.  ALLERGIES:  is allergic to albuterol sulfate; levaquin [levofloxacin in d5w]; doxycycline; lactose intolerance (gi); amoxicillin; codeine; penicillins; sulfa antibiotics; and sulfonamide derivatives.  MEDICATIONS:  Current Outpatient Prescriptions  Medication Sig Dispense Refill  . aspirin EC 81 MG tablet Take 1 tablet (81 mg total) by mouth daily. 90 tablet 3  . bimatoprost (LUMIGAN) 0.01 % SOLN Place 1 drop into both eyes daily. 5 mL 3  . budesonide-formoterol (SYMBICORT) 160-4.5 MCG/ACT inhaler Inhale 2 puffs into the lungs as needed (allergies).    . clindamycin (CLEOCIN) 300 MG capsule Take 600 mg by mouth once. For dental propcedures    . dicyclomine (BENTYL) 10 MG capsule Take 1 capsule (10 mg total) by mouth 3 (three) times daily as needed. IBS 90 capsule 3  . diltiazem (CARDIZEM CD)  240 MG 24 hr capsule Take 1 capsule (240 mg total) by mouth daily. 30 capsule 3  . loperamide (IMODIUM) 2 MG capsule Take 2 mg by mouth as needed for diarrhea or loose stools.     . Magnesium Oxide (MAGOX 400 PO) Take 1 tablet by mouth daily as needed (leg cramps).    . valsartan-hydrochlorothiazide (DIOVAN-HCT) 80-12.5 MG tablet Take 1 tablet by mouth daily. 30 tablet 6  . zolpidem (AMBIEN) 5 MG tablet Take 1 tablet (5 mg total) by mouth at bedtime as needed for sleep. 30 tablet 5   No current facility-administered medications for this visit.     PHYSICAL EXAMINATION: ECOG PERFORMANCE STATUS: 3  Vitals:   10/27/16 1206  BP: (!) 126/59  Pulse: 85  Resp: 18  Temp: 97.8 F (36.6 C)   Filed Weights   10/27/16 1206  Weight: 135 lb (61.2 kg)   GENERAL:alert, no distress and comfortable SKIN: skin color, texture, turgor are normal, no rashes or significant lesions EYES: normal, Conjunctiva are pink and non-injected, sclera clear OROPHARYNX:no exudate, no erythema and lips, buccal mucosa, and tongue normal  NECK: supple, thyroid normal size, non-tender, without nodularity  LYMPH:  no palpable lymphadenopathy in the cervical, axillary or inguinal LUNGS: clear to auscultation and percussion with normal breathing effort HEART: regular rate & rhythm and no murmurs and no lower extremity edema ABDOMEN:abdomen soft, non-tender and normal bowel sounds Musculoskeletal:no cyanosis of digits and no clubbing  NEURO: alert & oriented x 3 with fluent speech, no focal motor/sensory deficits  LABORATORY DATA:  I have reviewed the data as listed CBC Latest Ref Rng & Units 10/27/2016 10/11/2016 10/11/2016  WBC 3.9 - 10.3 10e3/uL 2.2(L) 2.0(L) -  Hemoglobin 11.6 - 15.9 g/dL 9.3(L) 9.3(L) -  Hematocrit 34.8 - 46.6 % 27.0(L) 28.2(L) 28.2(L)  Platelets 145 - 400 10e3/uL 989(H) 1,104(H) -     CMP Latest Ref Rng & Units 10/11/2016 09/28/2016 04/12/2016  Glucose 65 - 99 mg/dL - 116(H) 107(H)  BUN 8 - 27  mg/dL - 18 20  Creatinine 0.57 - 1.00 mg/dL - 0.60 0.80  Sodium 134 - 144 mmol/L - 141 141  Potassium 3.5 - 5.2 mmol/L - 4.4 4.5  Chloride 96 - 106 mmol/L - 101 106  CO2 18 - 29 mmol/L - 23 27  Calcium 8.7 - 10.3 mg/dL - 9.1 9.5  Total Protein 6.0 - 8.5 g/dL 6.5 - -  Total Bilirubin 0.2 - 1.2 mg/dL - - -  Alkaline Phos 33 - 130 U/L - - -  AST 10 - 35 U/L - - -  ALT 6 - 29 U/L - - -    RADIOGRAPHIC STUDIES: I have personally reviewed the radiological images as listed and agreed with the findings in the report. No results found.   ASSESSMENT & PLAN:  83 y.o. female presents With worsening fatigue and dyspnea on exertion since November 2017, CBC reviewed severe neutropenia, moderate anemia, and significant thrombocytosis.  1. Neutropenia, Macrocytic anemia and thrombocytosis, highly suspicious for MDS/MPN  -She has had anemia workup with Dr. sell 2 weeks ago, I reviewed the lab results with her today. -Her on study, vitamin D40 8144 pg/ml), folic acid levels are normal, however she does have elevated methylmalonic acid level, support mild B12 deficiency. I do not think her B12 deficiency can explain her abnormal CBC, especially thrombocytosis, however it may contribute to her severe fatigue. I recommend her to start B12 injection today and continue monthly. She does take B12 orally. -No lab evidence of hemolysis, haptoglobin was normal, LDH was normal -SPEP was negative for M protein -I suspect that this is likely MPN with or without MDS, although she does not have spleen medically on physical exam. -I strongly recommend her to have a bone marrow biopsy as soon as possible, I'll set up with IR to be done next week -I'll check detected mutation -I encouraged her to continue aspirin, to reduce her risk of thrombosis.  2.  Severe fatigue and dyspnea on exertion -Likely related to her moderate anemia, and underlying hematological disease  -Her hemoglobin 9.3 today, stable from 2 weeks ago,  I do not think she would and if it is much from blood transfusion. We'll hold off for now  3. Mild B12 deficiency -She has normal E 12 level, however methylmalonic acid level was elevated significantly -I'll start her on B12 injection monthly   Plan -Blood work today to see if she needs a blood transfusion -Ordered bone marrow biopsy for next week.  -B12 injection today.  -Return to clinic in 2 weeks, after her bone marrow biopsy to discuss results  All questions were answered. The patient knows to  call the clinic with any problems, questions or concerns. No barriers to learning was detected.  I spent 30 minutes counseling the patient face to face. The total time spent in the appointment was 35 minutes and more than 50% was on counseling and review of test results  This document serves as a record of services personally performed by Truitt Merle, MD. It was created on her behalf by Martinique Casey, a trained medical scribe. The creation of this record is based on the scribe's personal observations and the provider's statements to them. This document has been checked and approved by the attending provider.  I have reviewed the above documentation for accuracy and completeness and I agree with the above.    Truitt Merle, MD

## 2016-10-25 NOTE — Telephone Encounter (Signed)
Correction appointment with Dr. Burr Medico is 2/8 and appointment date/time has been corrected and confirmed with patient.

## 2016-10-25 NOTE — Telephone Encounter (Signed)
Spoke with patient re new appointments for 2/8 @ 10:45 am with Dr. Burr Medico. Patient see by Dr Talbert Cage and is aware f/u will be with Dr. Lafe Garin. Patient has appointment date/time restrictions and was ultimately given an appointment for tomorrow, which was the date offered as she could not accept any of the other available date/times.

## 2016-10-27 ENCOUNTER — Other Ambulatory Visit: Payer: Self-pay | Admitting: *Deleted

## 2016-10-27 ENCOUNTER — Ambulatory Visit (HOSPITAL_BASED_OUTPATIENT_CLINIC_OR_DEPARTMENT_OTHER): Payer: Medicare Other | Admitting: Hematology

## 2016-10-27 ENCOUNTER — Ambulatory Visit (HOSPITAL_BASED_OUTPATIENT_CLINIC_OR_DEPARTMENT_OTHER): Payer: Medicare Other

## 2016-10-27 ENCOUNTER — Telehealth: Payer: Self-pay | Admitting: *Deleted

## 2016-10-27 ENCOUNTER — Encounter: Payer: Self-pay | Admitting: Hematology

## 2016-10-27 ENCOUNTER — Ambulatory Visit (HOSPITAL_COMMUNITY)
Admission: RE | Admit: 2016-10-27 | Discharge: 2016-10-27 | Disposition: A | Discharge status: Home / Self Care | Payer: Medicare Other | Source: Ambulatory Visit | Attending: Hematology | Admitting: Hematology

## 2016-10-27 VITALS — BP 126/59 | HR 85 | Temp 97.8°F | Resp 18 | Ht 65.0 in | Wt 135.0 lb

## 2016-10-27 DIAGNOSIS — R5382 Chronic fatigue, unspecified: Secondary | ICD-10-CM | POA: Diagnosis not present

## 2016-10-27 DIAGNOSIS — D61818 Other pancytopenia: Secondary | ICD-10-CM

## 2016-10-27 DIAGNOSIS — D708 Other neutropenia: Secondary | ICD-10-CM | POA: Diagnosis not present

## 2016-10-27 DIAGNOSIS — D473 Essential (hemorrhagic) thrombocythemia: Secondary | ICD-10-CM

## 2016-10-27 DIAGNOSIS — D519 Vitamin B12 deficiency anemia, unspecified: Secondary | ICD-10-CM | POA: Diagnosis not present

## 2016-10-27 DIAGNOSIS — D75839 Thrombocytosis, unspecified: Secondary | ICD-10-CM

## 2016-10-27 DIAGNOSIS — D469 Myelodysplastic syndrome, unspecified: Secondary | ICD-10-CM | POA: Insufficient documentation

## 2016-10-27 DIAGNOSIS — D649 Anemia, unspecified: Secondary | ICD-10-CM

## 2016-10-27 LAB — CBC & DIFF AND RETIC
BASO%: 0.6 % (ref 0.0–2.0)
Basophils Absolute: 0 10*3/uL (ref 0.0–0.1)
EOS%: 1.1 % (ref 0.0–7.0)
Eosinophils Absolute: 0 10*3/uL (ref 0.0–0.5)
HCT: 27 % — ABNORMAL LOW (ref 34.8–46.6)
HGB: 9.3 g/dL — ABNORMAL LOW (ref 11.6–15.9)
Immature Retic Fract: 6.3 % (ref 1.60–10.00)
LYMPH#: 1.1 10*3/uL (ref 0.9–3.3)
LYMPH%: 51 % — ABNORMAL HIGH (ref 14.0–49.7)
MCH: 37.9 pg — AB (ref 25.1–34.0)
MCHC: 34.4 g/dL (ref 31.5–36.0)
MCV: 110.1 fL — AB (ref 79.5–101.0)
MONO#: 0.1 10*3/uL (ref 0.1–0.9)
MONO%: 5.4 % (ref 0.0–14.0)
NEUT#: 0.9 10*3/uL — ABNORMAL LOW (ref 1.5–6.5)
NEUT%: 41.9 % (ref 38.4–76.8)
NRBC: 0 % (ref 0–0)
Platelets: 989 10*3/uL — ABNORMAL HIGH (ref 145–400)
RBC: 2.46 10*6/uL — ABNORMAL LOW (ref 3.70–5.45)
RDW: 17.3 % — AB (ref 11.2–14.5)
RETIC %: 1.8 % (ref 0.70–2.10)
Retic Ct Abs: 44.28 10*3/uL (ref 33.70–90.70)
WBC: 2.2 10*3/uL — ABNORMAL LOW (ref 3.9–10.3)

## 2016-10-27 LAB — TECHNOLOGIST REVIEW

## 2016-10-27 MED ORDER — CYANOCOBALAMIN 1000 MCG/ML IJ SOLN
1000.0000 ug | Freq: Once | INTRAMUSCULAR | Status: AC
  Administered 2016-10-27: 1000 ug via INTRAMUSCULAR

## 2016-10-27 NOTE — Telephone Encounter (Signed)
Spoke with pt in the lobby.  Informed pt of Hgb 9.3 ;  Pt does not need blood transfusion.  Instructed pt to continue taking Aspirin 81 mg as prescribed.  Pt stated she will restart taking Aspirin today.  Above instructions from Dr. Burr Medico. Pt was stable at discharge by wheelchair with husband. Informed pt that radiology scheduler will contact pt with appt date and time for BM Bx.  Pt voiced understanding.

## 2016-10-29 ENCOUNTER — Telehealth: Payer: Self-pay

## 2016-10-29 NOTE — Telephone Encounter (Signed)
Spoke with patients husband and he is aware of the appts per 2/8 los   Amye Grego

## 2016-10-31 ENCOUNTER — Ambulatory Visit: Payer: Medicare Other

## 2016-11-04 ENCOUNTER — Other Ambulatory Visit: Payer: Self-pay | Admitting: Radiology

## 2016-11-07 ENCOUNTER — Other Ambulatory Visit: Payer: Self-pay | Admitting: Hematology

## 2016-11-07 ENCOUNTER — Ambulatory Visit (HOSPITAL_COMMUNITY)
Admission: RE | Admit: 2016-11-07 | Discharge: 2016-11-07 | Disposition: A | Discharge status: Home / Self Care | Payer: Medicare Other | Source: Ambulatory Visit | Attending: Hematology | Admitting: Hematology

## 2016-11-07 ENCOUNTER — Encounter (HOSPITAL_COMMUNITY): Payer: Self-pay

## 2016-11-07 DIAGNOSIS — Z86010 Personal history of colonic polyps: Secondary | ICD-10-CM | POA: Insufficient documentation

## 2016-11-07 DIAGNOSIS — D75839 Thrombocytosis, unspecified: Secondary | ICD-10-CM

## 2016-11-07 DIAGNOSIS — L299 Pruritus, unspecified: Secondary | ICD-10-CM | POA: Diagnosis not present

## 2016-11-07 DIAGNOSIS — D473 Essential (hemorrhagic) thrombocythemia: Secondary | ICD-10-CM

## 2016-11-07 DIAGNOSIS — G4700 Insomnia, unspecified: Secondary | ICD-10-CM | POA: Insufficient documentation

## 2016-11-07 DIAGNOSIS — F419 Anxiety disorder, unspecified: Secondary | ICD-10-CM | POA: Diagnosis not present

## 2016-11-07 DIAGNOSIS — J90 Pleural effusion, not elsewhere classified: Secondary | ICD-10-CM | POA: Diagnosis not present

## 2016-11-07 DIAGNOSIS — D539 Nutritional anemia, unspecified: Secondary | ICD-10-CM | POA: Insufficient documentation

## 2016-11-07 DIAGNOSIS — D61818 Other pancytopenia: Secondary | ICD-10-CM | POA: Diagnosis not present

## 2016-11-07 DIAGNOSIS — I4891 Unspecified atrial fibrillation: Secondary | ICD-10-CM | POA: Insufficient documentation

## 2016-11-07 DIAGNOSIS — D7589 Other specified diseases of blood and blood-forming organs: Secondary | ICD-10-CM | POA: Diagnosis not present

## 2016-11-07 DIAGNOSIS — J449 Chronic obstructive pulmonary disease, unspecified: Secondary | ICD-10-CM | POA: Diagnosis not present

## 2016-11-07 DIAGNOSIS — Z7982 Long term (current) use of aspirin: Secondary | ICD-10-CM | POA: Diagnosis not present

## 2016-11-07 DIAGNOSIS — D472 Monoclonal gammopathy: Secondary | ICD-10-CM | POA: Diagnosis not present

## 2016-11-07 DIAGNOSIS — I2510 Atherosclerotic heart disease of native coronary artery without angina pectoris: Secondary | ICD-10-CM | POA: Insufficient documentation

## 2016-11-07 DIAGNOSIS — R7989 Other specified abnormal findings of blood chemistry: Secondary | ICD-10-CM | POA: Insufficient documentation

## 2016-11-07 DIAGNOSIS — K589 Irritable bowel syndrome without diarrhea: Secondary | ICD-10-CM | POA: Insufficient documentation

## 2016-11-07 DIAGNOSIS — I10 Essential (primary) hypertension: Secondary | ICD-10-CM | POA: Insufficient documentation

## 2016-11-07 DIAGNOSIS — Z87891 Personal history of nicotine dependence: Secondary | ICD-10-CM | POA: Insufficient documentation

## 2016-11-07 DIAGNOSIS — M549 Dorsalgia, unspecified: Secondary | ICD-10-CM | POA: Insufficient documentation

## 2016-11-07 LAB — CBC WITH DIFFERENTIAL/PLATELET
Basophils Absolute: 0 10*3/uL (ref 0.0–0.1)
Basophils Relative: 1 %
EOS PCT: 1 %
Eosinophils Absolute: 0 10*3/uL (ref 0.0–0.7)
HEMATOCRIT: 27.9 % — AB (ref 36.0–46.0)
HEMOGLOBIN: 9.6 g/dL — AB (ref 12.0–15.0)
LYMPHS PCT: 53 %
Lymphs Abs: 1 10*3/uL (ref 0.7–4.0)
MCH: 37.6 pg — ABNORMAL HIGH (ref 26.0–34.0)
MCHC: 34.4 g/dL (ref 30.0–36.0)
MCV: 109.4 fL — ABNORMAL HIGH (ref 78.0–100.0)
MONO ABS: 0.1 10*3/uL (ref 0.1–1.0)
MONOS PCT: 4 %
Neutro Abs: 0.8 10*3/uL — ABNORMAL LOW (ref 1.7–7.7)
Neutrophils Relative %: 41 %
Platelets: 1035 10*3/uL (ref 150–400)
RBC: 2.55 MIL/uL — AB (ref 3.87–5.11)
RDW: 15.8 % — AB (ref 11.5–15.5)
WBC: 1.9 10*3/uL — AB (ref 4.0–10.5)

## 2016-11-07 LAB — PROTIME-INR
INR: 1.02
Prothrombin Time: 13.4 seconds (ref 11.4–15.2)

## 2016-11-07 MED ORDER — FENTANYL CITRATE (PF) 100 MCG/2ML IJ SOLN
INTRAMUSCULAR | Status: AC | PRN
  Administered 2016-11-07: 50 ug via INTRAVENOUS

## 2016-11-07 MED ORDER — FENTANYL CITRATE (PF) 100 MCG/2ML IJ SOLN
INTRAMUSCULAR | Status: AC
  Filled 2016-11-07: qty 4

## 2016-11-07 MED ORDER — MIDAZOLAM HCL 2 MG/2ML IJ SOLN
INTRAMUSCULAR | Status: AC
  Filled 2016-11-07: qty 4

## 2016-11-07 MED ORDER — SODIUM CHLORIDE 0.9 % IV SOLN
INTRAVENOUS | Status: DC
  Administered 2016-11-07: 09:00:00 via INTRAVENOUS

## 2016-11-07 MED ORDER — MIDAZOLAM HCL 2 MG/2ML IJ SOLN
INTRAMUSCULAR | Status: AC | PRN
Start: 2016-11-07 — End: 2016-11-07
  Administered 2016-11-07 (×2): 1 mg via INTRAVENOUS

## 2016-11-07 NOTE — Discharge Instructions (Signed)
Bone Marrow Aspiration and Bone Marrow Biopsy, Adult, Care After °This sheet gives you information about how to care for yourself after your procedure. Your health care provider may also give you more specific instructions. If you have problems or questions, contact your health care provider. °What can I expect after the procedure? °After the procedure, it is common to have: °· Mild pain and tenderness. °· Swelling. °· Bruising. °Follow these instructions at home: °· Take over-the-counter or prescription medicines only as told by your health care provider. °· Do not take baths, swim, or use a hot tub until your health care provider approves. Ask if you can take a shower or have a sponge bath. °· Follow instructions from your health care provider about how to take care of the puncture site. Make sure you: °¨ Wash your hands with soap and water before you change your bandage (dressing). If soap and water are not available, use hand sanitizer. °¨ Change your dressing as told by your health care provider. °· Check your puncture site every day for signs of infection. Check for: °¨ More redness, swelling, or pain. °¨ More fluid or blood. °¨ Warmth. °¨ Pus or a bad smell. °· Return to your normal activities as told by your health care provider. Ask your health care provider what activities are safe for you. °· Do not drive for 24 hours if you were given a medicine to help you relax (sedative). °· Keep all follow-up visits as told by your health care provider. This is important. °Contact a health care provider if: °· You have more redness, swelling, or pain around the puncture site. °· You have more fluid or blood coming from the puncture site. °· Your puncture site feels warm to the touch. °· You have pus or a bad smell coming from the puncture site. °· You have a fever. °· Your pain is not controlled with medicine. °This information is not intended to replace advice given to you by your health care provider. Make sure you  discuss any questions you have with your health care provider. °Document Released: 03/25/2005 Document Revised: 03/25/2016 Document Reviewed: 02/17/2016 °Elsevier Interactive Patient Education © 2017 [employer] °Moderate Conscious Sedation, Adult, Care After °These instructions provide you with information about caring for yourself after your procedure. Your health care provider may also give you more specific instructions. Your treatment has been planned according to current medical practices, but problems sometimes occur. Call your health care provider if you have any problems or questions after your procedure. °What can I expect after the procedure? °After your procedure, it is common: °· To feel sleepy for several hours. °· To feel clumsy and have poor balance for several hours. °· To have poor judgment for several hours. °· To vomit if you eat too soon. °Follow these instructions at home: °For at least 24 hours after the procedure:  °· Do not: °¨ Participate in activities where you could fall or become injured. °¨ Drive. °¨ Use heavy machinery. °¨ Drink alcohol. °¨ Take sleeping pills or medicines that cause drowsiness. °¨ Make important decisions or sign legal documents. °¨ Take care of children on your own. °· Rest. °Eating and drinking °· Follow the diet recommended by your health care provider. °· If you vomit: °¨ Drink water, juice, or soup when you can drink without vomiting. °¨ Make sure you have little or no nausea before eating solid foods. °General instructions °· Have a responsible adult stay with you until you are awake and   alert.  Take over-the-counter and prescription medicines only as told by your health care provider.  If you smoke, do not smoke without supervision.  Keep all follow-up visits as told by your health care provider. This is important. Contact a health care provider if:  You keep feeling nauseous or you keep vomiting.  You feel light-headed.  You develop a  rash.  You have a fever. Get help right away if:  You have trouble breathing. This information is not intended to replace advice given to you by your health care provider. Make sure you discuss any questions you have with your health care provider. Document Released: 06/26/2013 Document Revised: 02/08/2016 Document Reviewed: 12/26/2015 Elsevier Interactive Patient Education  2017 [employer].

## 2016-11-07 NOTE — Progress Notes (Signed)
CRITICAL VALUE ALERT  Critical value received:  PLT 1035  Date of notification:  11/07/16  Time of notification:  [id number]  Critical value read back: Yes  Nurse who received alert:  Lavon Paganini  MD notified (1st page):  Rowe Robert, PA  Time of first page:  10:06  MD notified (2nd page):  Time of second page:  Responding MD:  Rowe Robert, PA  Time MD responded:  10:08

## 2016-11-07 NOTE — Procedures (Signed)
Pre-procedure Diagnosis: Evaluate for MDS Post-procedure Diagnosis: Same  Technically successful CT guided bone marrow aspiration and biopsy of left iliac crest.   Complications: None Immediate  EBL: None  SignedSandi Mariscal Pager: [phone] 11/07/2016, 12:11 PM

## 2016-11-07 NOTE — Consult Note (Signed)
Chief Complaint: Patient was seen in consultation today for CT guided bone marrow biopsy  Referring Physician(s): Feng,Yan  Supervising Physician: Sandi Mariscal  Patient Status: [facility] - Out-pt  History of Present Illness: Lori [name] is a 83 y.o. female with significant past medical history as listed below as well as neutropenia, anemia, and  thrombocytosis of unknown origin who presents today for CT-guided bone marrow biopsy to rule out MDS/MPN.  Past Medical History:  Diagnosis Date  . ALLERGIC RHINITIS   . Anxiety   . Asthma   . Atrial fibrillation ([place])   . Back pain of thoracolumbar region    right  . Bronchiectasis   . Colles' fracture of left radius   . COPD (chronic obstructive pulmonary disease) ([place])   . Coronary artery disease   . Diastolic dysfunction   . Diverticulosis   . Dysrhythmia   . History of pneumonia   . Hx of cardiovascular stress test    a. ETT-MV 1/14: Exercised 4:16, no ECG changes, poor ex tol, ant defect c/w soft tissue atten, no ischemia, EF 75%  . Hx of colonic polyp   . Hypertension   . IBS (irritable bowel syndrome)   . Insomnia   . Multiple rib fractures 10/2008   bilateral  . Pleural effusion, left   . Pruritus   . Pulmonary nodule     Past Surgical History:  Procedure Laterality Date  . ANKLE CLOSED REDUCTION Right 12/11/2014   Procedure: CLOSED REDUCTION ANKLE;  Surgeon: Rod Can, MD;  Location: [place];  Service: Orthopedics;  Laterality: Right;  . APPENDECTOMY    . BLADDER SUSPENSION  2005   A-P  . CHOLECYSTECTOMY    . COLONOSCOPY  04/06/2012   Procedure: COLONOSCOPY;  Surgeon: Jerene Bears, MD;  [facility];  Service: Gastroenterology;  Laterality: N/A;  . EXTERNAL FIXATION LEG Right 12/12/2014   Procedure: ADJUSTMENT OF EXTERNAL FIXATION  RIGHT ANKLE;  Surgeon: Rod Can, MD;  Location: [place];  Service: Orthopedics;  Laterality: Right;  . EXTERNAL FIXATION LEG Right 12/11/2014   Procedure: POSSIBLE  EXTERNAL FIXATION RIGHT ANKLE;  Surgeon: Rod Can, MD;  Location: [place];  Service: Orthopedics;  Laterality: Right;  . HARDWARE REMOVAL Right 12/25/2014   Procedure: RIGHT ANKLE REMOVE OF DEEP IMPLANT ANE EXTERNAL FIXATOR ;  Surgeon: [name], MD;  Location: [place];  Service: Orthopedics;  Laterality: Right;  . ORIF ANKLE FRACTURE Right 12/25/2014   Procedure: OPEN REDUCTION INTERNAL FIXATION (ORIF)TRIMALLEOLAR  ANKLE FRACTURE;  Surgeon: [name], MD;  Location: [place];  Service: Orthopedics;  Laterality: Right;  . ORIF WRIST FRACTURE Left 2010     Dr. Burney Gauze.  Marland Kitchen POLYPECTOMY    . ROTATOR CUFF REPAIR Right 2006  . TONSILLECTOMY    . TONSILLECTOMY    . TOTAL KNEE ARTHROPLASTY Right 08/2010    Allergies: Albuterol sulfate; Levaquin [levofloxacin in d5w]; Doxycycline; Lactose intolerance (gi); Amoxicillin; Codeine; Penicillins; Sulfa antibiotics; and Sulfonamide derivatives  Medications: Prior to Admission medications   Medication Sig Start Date End Date Taking? Authorizing Provider  aspirin EC 81 MG tablet Take 1 tablet (81 mg total) by mouth daily. 06/02/15  Yes Burtis Junes, NP  bimatoprost (LUMIGAN) 0.01 % SOLN Place 1 drop into both eyes daily. 08/06/14  Yes Janith Lima, MD  dicyclomine (BENTYL) 10 MG capsule Take 1 capsule (10 mg total) by mouth 3 (three) times daily as needed. IBS 11/25/14  Yes Marcello Moores L  Ronnald Ramp, MD  diltiazem (CARDIZEM CD) 240 MG 24 hr capsule Take 1 capsule (240 mg total) by mouth daily. 10/04/16 01/02/17 Yes Burtis Junes, NP  loperamide (IMODIUM) 2 MG capsule Take 2 mg by mouth as needed for diarrhea or loose stools.    Yes Historical Provider, MD  Magnesium Oxide (MAGOX 400 PO) Take 1 tablet by mouth daily as needed (leg cramps).   Yes Historical Provider, MD  valsartan-hydrochlorothiazide (DIOVAN-HCT) 80-12.5 MG tablet Take 1 tablet by mouth daily. 10/04/16  Yes Burtis Junes, NP  budesonide-formoterol (SYMBICORT)  160-4.5 MCG/ACT inhaler Inhale 2 puffs into the lungs as needed (allergies).    Historical Provider, MD  clindamycin (CLEOCIN) 300 MG capsule Take 600 mg by mouth once. For dental propcedures    Historical Provider, MD  zolpidem (AMBIEN) 5 MG tablet Take 1 tablet (5 mg total) by mouth at bedtime as needed for sleep. 06/10/15   Larey Dresser, MD     Family History  Problem Relation Age of Onset  . Heart disease Mother   . Rheumatic fever Mother   . Bipolar disorder Daughter   . Pneumonia Father   . Colon cancer Neg Hx     Social History   Social History  . Marital status: Married    Spouse name: N/A  . Number of children: 3  . Years of education: N/A   Occupational History  . retired Retired   Social History Main Topics  . Smoking status: Former Smoker    Packs/day: 0.30    Years: 10.00    Types: Cigarettes    Quit date: 09/19/1948  . Smokeless tobacco: Never Used  . Alcohol use No     Comment: rarely  . Drug use: No  . Sexual activity: No   Other Topics Concern  . None   Social History Narrative   [place]; [place] [employer]. Married 1959 - spouse s/p valve surgery with resulting paralysis hemidiaphragm ('10). 3 daughter, 5 grandsons, 4 granddaughters.  1 daughter bipolar - social issues. She remains very active, but can't play tennis. Pt was apprently ex-smoker, but husband does not recollect pt ever smoking. Stressful move to smaller quarters (fall '09) and now moving to [place] (fall '12).      ACP - she clearly states No CPR, no mechanical ventilation and that quality of life is of key importance. Provided MOST sample, directed to [email or web address] and provided an Out of Facility Order. (Jan '14)      Review of Systems Currently denies fever, headache, significant chest pain, cough, abdominal pain,nausea, vomiting or abnormal bleeding. She is hard of hearing and does have some dyspnea with exertion/ fatigue and intermittent back  pain.  Vital Signs: BP 113/89 (BP Location: Left Wrist)   Pulse 80   Temp 98 F (36.7 C) (Oral)   Resp 18   SpO2 97%   Physical Exam Awake, alert. Chest clear to auscultation bilaterally. Heart with irregularly irregular rhythm. Abdomen soft, positive bowel sounds, nontender. Lower extremities with trace pretibial edema bilaterally.  Mallampati Score:     Imaging: No results found.  Labs:  CBC:  Recent Labs  04/11/16 1537 09/28/16 0953 10/11/16 1222 10/27/16 1259  WBC 4.1 2.0* 2.0* 2.2*  HGB 12.5  --  9.3* 9.3*  HCT 38.1 28.5* 28.2*  28.2* 27.0*  PLT 693* 748* 1,104* 989*    COAGS: No results for input(s): INR, APTT in the last 8760 hours.  BMP:  Recent  Labs  04/11/16 1537 04/12/16 0930 09/28/16 0953  NA 144 141 141  K 6.0* 4.5 4.4  CL 107 106 101  CO2 26 27 23   GLUCOSE 125* 107* 116*  BUN 26* 20 18  CALCIUM 10.0 9.5 9.1  CREATININE 0.88 0.80 0.60  GFRNONAA  --  >60 82  GFRAA  --  >60 94    LIVER FUNCTION TESTS:  Recent Labs  10/11/16 1222  PROT 6.5    TUMOR MARKERS: No results for input(s): AFPTM, CEA, CA199, CHROMGRNA in the last 8760 hours.  Assessment and Plan: 83 y.o. female with significant past medical history as listed below as well as neutropenia, anemia, and  thrombocytosis of unknown origin who presents today for CT-guided bone marrow biopsy to rule out MDS/MPN.Risks and benefits discussed with the patient including, but not limited to bleeding, infection, damage to adjacent structures or low yield requiring additional tests. All of the patient's questions were answered, patient is agreeable to proceed. Consent signed and in chart.     Thank you for this interesting consult.  I greatly enjoyed meeting Lori [name] and look forward to participating in their care.  A copy of this report was sent to the requesting provider on this date.  Electronically Signed: D. Rowe Robert 11/07/2016, 9:25 AM   I spent a total of 20 minutes   in face to face in clinical consultation, greater than 50% of which was counseling/coordinating care for  CT guided bone marrow biopsy

## 2016-11-10 NOTE — Progress Notes (Signed)
[place]  Telephone:([phone]) [phone] [phone]) [phone]  Clinic Follow up Note   Patient Care Team: Leanna Battles, MD as PCP - General (Internal Medicine) Brand Males, MD as Consulting Physician (Pulmonary Disease) [name], MD as Consulting Physician (Orthopedic Surgery) 11/11/2016  CHIEF COMPLAIN: fatigue and dyspnea on exertion  HISTORY OF PRESENT ILLNESS (From Dr. Laverle Patter note on 10/11/2016): Lori [name] 83 y.o. female is here for evaluation of leukopenia. Patient states that she's been having severe fatigue since November 2017. She was having trouble with her atrial fibrillation again, however this has improved. CBC from 12/617 demonstrated WBC 4.83K, hemoglobin 10.9 g/dL, hematocrit 31.8%, MCV 105.4, place a count 764K. CBC from 04/11/16 demonstrated WBC 4.1K, hemoglobin 12.5 g/dL, hematocrit 38.1%, platelet count 693K. Most recent CBC from 09/28/16 demonstrated worsening of her leukopenia and anemia with WBC 2K, hemoglobin 9.6 g/dL, hematocrit 28.5%, MCV 107, platelet count 748K. She also has diarrhea about 3-4 times a day, however she states that this is been a lifelong problem for her. She has lost about 5 pounds in the past 2 months. She also has occasional shortness of breath. She denies any recent changes in her medications.  CURRENT THERAPY: pending   INTERVAL HISTORY: She returns for follow up. She has not been feeling well. She is depressed because she is tired and can not do everything she would like to do. Her husband says she was vomiting and had diarrhea last week. She has been bruising very easily. Pt still has some SOB. She takes a baby aspirin every day. She felt light headed the other day and she says she feels like this often. After eating she has some abdominal pain. Denies headaches, or any other concerns.   REVIEW OF SYSTEMS:   Constitutional: Denies fevers, chills or abnormal weight loss (+) fatigue, light headed Eyes: Denies blurriness of  vision Ears, nose, mouth, throat, and face: Denies mucositis or sore throat (+) hearing loss Respiratory: Denies cough, dyspnea or wheezes (+) SOB Cardiovascular: Denies palpitation, chest discomfort or lower extremity swelling Gastrointestinal:  Denies nausea, heartburn or change in bowel habits (+) vomiting, diarrhea, abdominal pain Skin: Denies abnormal skin rashes Lymphatics: Denies new lymphadenopathy (+) easy bruising Neurological:Denies numbness, tingling or new weaknesses Behavioral/Psych: Mood is stable, no new changes  All other systems were reviewed with the patient and are negative.  MEDICAL HISTORY:  Past Medical History:  Diagnosis Date  . ALLERGIC RHINITIS   . Anxiety   . Asthma   . Atrial fibrillation ([place])   . Back pain of thoracolumbar region    right  . Bronchiectasis   . Colles' fracture of left radius   . COPD (chronic obstructive pulmonary disease) ([place])   . Coronary artery disease   . Diastolic dysfunction   . Diverticulosis   . Dysrhythmia   . History of pneumonia   . Hx of cardiovascular stress test    a. ETT-MV 1/14: Exercised 4:16, no ECG changes, poor ex tol, ant defect c/w soft tissue atten, no ischemia, EF 75%  . Hx of colonic polyp   . Hypertension   . IBS (irritable bowel syndrome)   . Insomnia   . Multiple rib fractures 10/2008   bilateral  . Pleural effusion, left   . Pruritus   . Pulmonary nodule     SURGICAL HISTORY: Past Surgical History:  Procedure Laterality Date  . ANKLE CLOSED REDUCTION Right 12/11/2014   Procedure: CLOSED REDUCTION ANKLE;  Surgeon: Rod Can, MD;  [name];  Service: Orthopedics;  Laterality: Right;  . APPENDECTOMY    . BLADDER SUSPENSION  2005   A-P  . CHOLECYSTECTOMY    . COLONOSCOPY  04/06/2012   Procedure: COLONOSCOPY;  Surgeon: Jerene Bears, MD;  [facility];  Service: Gastroenterology;  Laterality: N/A;  . EXTERNAL FIXATION LEG Right 12/12/2014   Procedure: ADJUSTMENT OF EXTERNAL  FIXATION  RIGHT ANKLE;  Surgeon: Rod Can, MD;  Location: [place];  Service: Orthopedics;  Laterality: Right;  . EXTERNAL FIXATION LEG Right 12/11/2014   Procedure: POSSIBLE EXTERNAL FIXATION RIGHT ANKLE;  Surgeon: Rod Can, MD;  Location: [place];  Service: Orthopedics;  Laterality: Right;  . HARDWARE REMOVAL Right 12/25/2014   Procedure: RIGHT ANKLE REMOVE OF DEEP IMPLANT ANE EXTERNAL FIXATOR ;  Surgeon: [name], MD;  Location: [place];  Service: Orthopedics;  Laterality: Right;  . ORIF ANKLE FRACTURE Right 12/25/2014   Procedure: OPEN REDUCTION INTERNAL FIXATION (ORIF)TRIMALLEOLAR  ANKLE FRACTURE;  Surgeon: [name], MD;  Location: [place];  Service: Orthopedics;  Laterality: Right;  . ORIF WRIST FRACTURE Left 2010     Dr. Burney Gauze.  Marland Kitchen POLYPECTOMY    . ROTATOR CUFF REPAIR Right 2006  . TONSILLECTOMY    . TONSILLECTOMY    . TOTAL KNEE ARTHROPLASTY Right 08/2010    I have reviewed the social history and family history with the patient and they are unchanged from previous note.  ALLERGIES:  is allergic to albuterol sulfate; levaquin [levofloxacin in d5w]; doxycycline; lactose intolerance (gi); amoxicillin; codeine; penicillins; sulfa antibiotics; and sulfonamide derivatives.  MEDICATIONS:  Current Outpatient Prescriptions  Medication Sig Dispense Refill  . aspirin EC 81 MG tablet Take 1 tablet (81 mg total) by mouth daily. 90 tablet 3  . bimatoprost (LUMIGAN) 0.01 % SOLN Place 1 drop into both eyes daily. 5 mL 3  . dicyclomine (BENTYL) 10 MG capsule Take 1 capsule (10 mg total) by mouth 3 (three) times daily as needed. IBS 90 capsule 3  . diltiazem (CARDIZEM CD) 240 MG 24 hr capsule Take 1 capsule (240 mg total) by mouth daily. 30 capsule 3  . loperamide (IMODIUM) 2 MG capsule Take 2 mg by mouth as needed for diarrhea or loose stools.     . Magnesium Oxide (MAGOX 400 PO) Take 1 tablet by mouth daily as needed (leg cramps).    .  valsartan-hydrochlorothiazide (DIOVAN-HCT) 80-12.5 MG tablet Take 1 tablet by mouth daily. 30 tablet 6  . anagrelide (AGRYLIN) 0.5 MG capsule Take 1 capsule (0.5 mg total) by mouth 2 (two) times daily. 60 capsule 0  . budesonide-formoterol (SYMBICORT) 160-4.5 MCG/ACT inhaler Inhale 2 puffs into the lungs as needed (allergies).    . clindamycin (CLEOCIN) 300 MG capsule Take 600 mg by mouth once. For dental propcedures    . zolpidem (AMBIEN) 5 MG tablet Take 1 tablet (5 mg total) by mouth at bedtime as needed for sleep. (Patient not taking: Reported on 11/11/2016) 30 tablet 5   No current facility-administered medications for this visit.     PHYSICAL EXAMINATION: ECOG PERFORMANCE STATUS: 3  Vitals:   11/11/16 1039  BP: (!) 137/56  Pulse: 79  Resp: 18  Temp: 97.7 F (36.5 C)   Filed Weights   11/11/16 1039  Weight: 133 lb (60.3 kg)    GENERAL:alert, no distress and comfortable SKIN: skin color, texture, turgor are normal, no rashes or significant lesions EYES: normal, Conjunctiva are pink and non-injected, sclera clear OROPHARYNX:no exudate, no  erythema and lips, buccal mucosa, and tongue normal  NECK: supple, thyroid normal size, non-tender, without nodularity LYMPH:  no palpable lymphadenopathy in the cervical, axillary or inguinal LUNGS: clear to auscultation and percussion with normal breathing effort HEART: regular rate & rhythm and no murmurs and no lower extremity edema ABDOMEN:abdomen soft, non-tender and normal bowel sounds Musculoskeletal:no cyanosis of digits and no clubbing  NEURO: alert & oriented x 3 with fluent speech, no focal motor/sensory deficits  LABORATORY DATA:  I have reviewed the data as listed CBC Latest Ref Rng & Units 11/11/2016 11/07/2016 10/27/2016  WBC 3.9 - 10.3 10e3/uL 1.8(L) 1.9(L) 2.2(L)  Hemoglobin 11.6 - 15.9 g/dL 9.1(L) 9.6(L) 9.3(L)  Hematocrit 34.8 - 46.6 % 27.6(L) 27.9(L) 27.0(L)  Platelets 145 - 400 10e3/uL 873(H) 1,035(HH) 989(H)     CMP  Latest Ref Rng & Units 11/11/2016 10/11/2016 09/28/2016  Glucose 70 - 140 mg/dl 100 - 116(H)  BUN 7.0 - 26.0 mg/dL 28.1(H) - 18  Creatinine 0.6 - 1.1 mg/dL 0.8 - 0.60  Sodium 136 - 145 mEq/L 138 - 141  Potassium 3.5 - 5.1 mEq/L 5.0 - 4.4  Chloride 96 - 106 mmol/L - - 101  CO2 22 - 29 mEq/L 26 - 23  Calcium 8.4 - 10.4 mg/dL 9.5 - 9.1  Total Protein 6.4 - 8.3 g/dL 6.6 6.5 -  Total Bilirubin 0.20 - 1.20 mg/dL 0.61 - -  Alkaline Phos 40 - 150 U/L 80 - -  AST 5 - 34 U/L 11 - -  ALT 0 - 55 U/L 12 - -   PATHOLOGY:  Diagnosis 11/07/2016 Bone Marrow, Aspirate,Biopsy, and Clot, left iliac crest - HYPERCELLULAR BONE MARROW FOR AGE WITH FEATURES OF MYELOID NEOPLASM. - SEE COMMENT. PERIPHERAL BLOOD: - MACROCYTIC ANEMIA. - LEUKOPENIA. - MARKED THROMBOCYTOSIS. Diagnosis Note The sections show a hypercellular bone marrow for age with dyspoietic changes involving myeloid cell lines associated with prominent increase in megakaryocytes and peripheral thrombocytosis. Definite increase in blastic cells is not identified by morphology, immunohistochemistry or flow cytometric studies. The overall features are most compatible with a myelodysplastic/myeloproliferative neoplasm. Correlation with cytogenetic and FISH studies is recommended. (BNS:kh 11-08-16)  RADIOGRAPHIC STUDIES: I have personally reviewed the radiological images as listed and agreed with the findings in the report. No results found.   ASSESSMENT & PLAN:  83 y.o. female presents With worsening fatigue and dyspnea on exertion since November 2017, CBC reviewed severe neutropenia, moderate anemia, and significant thrombocytosis.  1. MDS/MPN, with neutropenia, anemia and thrombocytosis  -She has had anemia workup with Dr. Talbert Cage, I reviewed the lab results with her today. -Her iron study, vitamin W96, folic acid levels are normal, however she does have elevated methylmalonic acid level, support mild B12 deficiency. I do not think her B12  deficiency can explain her abnormal CBC, especially thrombocytosis, however it may contribute to her severe fatigue. I recommend her to start B12 injection today and continue monthly. She does take B12 orally. -No lab evidence of hemolysis, haptoglobin was normal, LDH was normal -SPEP was negative for M protein -I reviewed her bone marrow biopsy with patient and her husband, which showed MDS and MPN, no evidence for myelofibrosis. -We reviewed the nature history of MDS and MPN, unfortunately this is not curable disease, but is very treatable. -Her cytogenetics is still pending, risk stratification of her MDS will be done on her next visit when I have cytogenetics result -We discussed the treatment options for her MDS and MPN. I recommend her to start  anagrelide 0.5 mg twice daily, to control her thrombocytosis. She is not a candidate for Hydrea due to her neutropenia and anemia. -I will start her on Aranesp injection next week to improve her anemia, benefits and the risks, especially is risk of thrombosis were discussed with patient. She agrees to proceed. - If she does have high risk MDS, I would consider Vidaza -I encouraged her to continue aspirin, to reduce her risk of thrombosis. -Aranesp injection every 2 weeks.  -Ordered US of the abdomen to evaluate her liver and spleen.  2.  Severe fatigue and dyspnea on exertion -Likely related to her moderate anemia, and underlying MDS/MPN -Her hemoglobin 9.1 today, will give 1u RBC tomorrow to see if her fatigue improves   3. Mild B12 deficiency -She has normal E 12 level, however methylmalonic acid level was elevated significantly -I have start her on B12 injection monthly  4. Depression and Anxiety -Over diagnosis and not being able to do all of the activities she use to do   Plan -Start Anagrelide 0.27m BID and Aranesp injection 1553m every 2 weeks next week.  -Labs and injection next week.  -Blood transfusion 1 unit tomorrow.   -Ordered  [place] the abdomen.  -Return to clinic in 3 weeks.  All questions were answered. The patient knows to call the clinic with any problems, questions or concerns. No barriers to learning was detected.  I spent 30 minutes counseling the patient face to face. The total time spent in the appointment was 40 minutes and more than 50% was on counseling and review of test results  This document serves as a record of services personally performed by YaTruitt MerleMD. It was created on her behalf by [email or web address], a trained medical scribe. The creation of this record is based on the scribe's personal observations and the provider's statements to them. This document has been checked and approved by the attending provider.  I have reviewed the above documentation for accuracy and completeness and I agree with the above.    FeTruitt MerleMD

## 2016-11-11 ENCOUNTER — Other Ambulatory Visit (HOSPITAL_BASED_OUTPATIENT_CLINIC_OR_DEPARTMENT_OTHER): Payer: Medicare Other

## 2016-11-11 ENCOUNTER — Ambulatory Visit (HOSPITAL_BASED_OUTPATIENT_CLINIC_OR_DEPARTMENT_OTHER): Payer: Medicare Other | Admitting: Hematology

## 2016-11-11 ENCOUNTER — Other Ambulatory Visit: Payer: Self-pay | Admitting: *Deleted

## 2016-11-11 ENCOUNTER — Telehealth: Payer: Self-pay | Admitting: Hematology

## 2016-11-11 ENCOUNTER — Ambulatory Visit: Payer: Medicare Other

## 2016-11-11 ENCOUNTER — Encounter: Payer: Self-pay | Admitting: Hematology

## 2016-11-11 DIAGNOSIS — F329 Major depressive disorder, single episode, unspecified: Secondary | ICD-10-CM

## 2016-11-11 DIAGNOSIS — E538 Deficiency of other specified B group vitamins: Secondary | ICD-10-CM | POA: Diagnosis not present

## 2016-11-11 DIAGNOSIS — D708 Other neutropenia: Secondary | ICD-10-CM | POA: Diagnosis not present

## 2016-11-11 DIAGNOSIS — R0609 Other forms of dyspnea: Secondary | ICD-10-CM

## 2016-11-11 DIAGNOSIS — D649 Anemia, unspecified: Secondary | ICD-10-CM

## 2016-11-11 DIAGNOSIS — D469 Myelodysplastic syndrome, unspecified: Secondary | ICD-10-CM

## 2016-11-11 DIAGNOSIS — D61818 Other pancytopenia: Secondary | ICD-10-CM

## 2016-11-11 DIAGNOSIS — F419 Anxiety disorder, unspecified: Secondary | ICD-10-CM

## 2016-11-11 DIAGNOSIS — R5382 Chronic fatigue, unspecified: Secondary | ICD-10-CM

## 2016-11-11 DIAGNOSIS — D473 Essential (hemorrhagic) thrombocythemia: Secondary | ICD-10-CM | POA: Diagnosis not present

## 2016-11-11 LAB — CBC & DIFF AND RETIC
BASO%: 0 % (ref 0.0–2.0)
Basophils Absolute: 0 10*3/uL (ref 0.0–0.1)
EOS%: 1.1 % (ref 0.0–7.0)
Eosinophils Absolute: 0 10*3/uL (ref 0.0–0.5)
HCT: 27.6 % — ABNORMAL LOW (ref 34.8–46.6)
HGB: 9.1 g/dL — ABNORMAL LOW (ref 11.6–15.9)
Immature Retic Fract: 7.6 % (ref 1.60–10.00)
LYMPH#: 0.9 10*3/uL (ref 0.9–3.3)
LYMPH%: 49.7 % (ref 14.0–49.7)
MCH: 36.5 pg — AB (ref 25.1–34.0)
MCHC: 33 g/dL (ref 31.5–36.0)
MCV: 110.8 fL — AB (ref 79.5–101.0)
MONO#: 0.1 10*3/uL (ref 0.1–0.9)
MONO%: 4.4 % (ref 0.0–14.0)
NEUT%: 44.8 % (ref 38.4–76.8)
NEUTROS ABS: 0.8 10*3/uL — AB (ref 1.5–6.5)
NRBC: 0 % (ref 0–0)
Platelets: 873 10*3/uL — ABNORMAL HIGH (ref 145–400)
RBC: 2.49 10*6/uL — AB (ref 3.70–5.45)
RDW: 15.5 % — AB (ref 11.2–14.5)
Retic %: 1.78 % (ref 0.70–2.10)
Retic Ct Abs: 44.32 10*3/uL (ref 33.70–90.70)
WBC: 1.8 10*3/uL — AB (ref 3.9–10.3)

## 2016-11-11 LAB — COMPREHENSIVE METABOLIC PANEL
ALBUMIN: 4.1 g/dL (ref 3.5–5.0)
ALK PHOS: 80 U/L (ref 40–150)
ALT: 12 U/L (ref 0–55)
AST: 11 U/L (ref 5–34)
Anion Gap: 6 mEq/L (ref 3–11)
BUN: 28.1 mg/dL — ABNORMAL HIGH (ref 7.0–26.0)
CHLORIDE: 105 meq/L (ref 98–109)
CO2: 26 meq/L (ref 22–29)
Calcium: 9.5 mg/dL (ref 8.4–10.4)
Creatinine: 0.8 mg/dL (ref 0.6–1.1)
EGFR: 71 mL/min/{1.73_m2} — AB (ref >90)
GLUCOSE: 100 mg/dL (ref 70–140)
POTASSIUM: 5 meq/L (ref 3.5–5.1)
SODIUM: 138 meq/L (ref 136–145)
Total Bilirubin: 0.61 mg/dL (ref 0.20–1.20)
Total Protein: 6.6 g/dL (ref 6.4–8.3)

## 2016-11-11 LAB — BONE MARROW EXAM

## 2016-11-11 LAB — PREPARE RBC (CROSSMATCH)

## 2016-11-11 MED ORDER — ANAGRELIDE HCL 0.5 MG PO CAPS: 0.5000 mg | ORAL_CAPSULE | Freq: Two times a day (BID) | ORAL | 0 refills | Status: DC

## 2016-11-11 NOTE — Telephone Encounter (Signed)
Message sent to Lori [name] and Lori [name] to add Blood transfusion per 11/11/16 los. Lab was added for type and cross, per 11/11/16 los. Labs, injection and follow up scheduled per 11/11/16 los. Patient was given a copy of the AVS report and appointment schedule per 11/11/16 los.

## 2016-11-11 NOTE — Telephone Encounter (Signed)
Called patient and left a voice mail with Blood transfusion appointment details, per requested by patient. 11/11/16

## 2016-11-12 ENCOUNTER — Ambulatory Visit: Payer: Medicare Other

## 2016-11-12 DIAGNOSIS — D469 Myelodysplastic syndrome, unspecified: Secondary | ICD-10-CM

## 2016-11-12 DIAGNOSIS — C946 Myelodysplastic disease, not classified: Secondary | ICD-10-CM

## 2016-11-12 MED ORDER — ACETAMINOPHEN 325 MG PO TABS
ORAL_TABLET | ORAL | Status: AC
  Filled 2016-11-12: qty 2

## 2016-11-12 MED ORDER — SODIUM CHLORIDE 0.9 % IV SOLN
250.0000 mL | Freq: Once | INTRAVENOUS | Status: AC
  Administered 2016-11-12: 250 mL via INTRAVENOUS

## 2016-11-12 MED ORDER — ACETAMINOPHEN 325 MG PO TABS
650.0000 mg | ORAL_TABLET | Freq: Once | ORAL | Status: AC
  Administered 2016-11-12: 650 mg via ORAL

## 2016-11-12 NOTE — Patient Instructions (Signed)
Blood Transfusion A blood transfusion is a procedure in which you are given blood through an IV tube. You may need this procedure because of:  Illness.  Surgery.  Injury.  The blood may come from someone else (a donor). You may also be able to donate blood for yourself (autologous blood donation). The blood given in a transfusion is made up of different types of cells. You may get:  Red blood cells. These carry oxygen to the cells in the body.  White blood cells. These help you fight infections.  Platelets. These help your blood to clot.  Plasma. This is the liquid part of your blood. It helps with fluid imbalances.  If you have a clotting disorder, you may also get other types of blood products. What happens before the procedure?  You will have a blood test to find out your blood type. The test also finds out what type of blood your body will accept and matches it to the donor type.  If you are going to have a planned surgery, you may be able to donate your own blood. This may be done in case you need a transfusion.  If you have had an allergic reaction to a transfusion in the past, you may be given medicine to help prevent a reaction. This medicine may be given to you by mouth or through an IV.  You will have your temperature, blood pressure, and pulse checked.  Follow instructions from your doctor about what you cannot eat or drink.  Ask your doctor about: ? Changing or stopping your regular medicines. This is important if you take diabetes medicines or blood thinners. ? Taking medicines such as aspirin and ibuprofen. These medicines can thin your blood. Do not take these medicines before your procedure if your doctor tells you not to. What happens during the procedure?  An IV tube will be put into one of your veins.  The bag of donated blood will be attached to your IV tube. Then, the blood will enter through your vein.  Your temperature, blood pressure, and pulse will be  checked regularly during the procedure. This is done to find early signs of a transfusion reaction.  If you have any signs or symptoms of a reaction, your transfusion will be stopped. You may also be given medicine.  When the transfusion is done, your IV tube will be taken out.  Pressure may be applied to the IV site for a few minutes.  A bandage (dressing) will be put on the IV site. The procedure may vary among doctors and hospitals. What happens after the procedure?  Your temperature, blood pressure, heart rate, breathing rate, and blood oxygen level will be checked often.  Your blood may be tested to see how you are responding to the transfusion.  You may be warmed with fluids or blankets. This is done to keep the temperature of your body normal. Summary  A blood transfusion is a procedure in which you are given blood through an IV tube.  The blood may come from someone else (a donor). You may also be able to donate blood for yourself.  If you have had an allergic reaction to a transfusion in the past, you may be given medicine to help prevent a reaction. This medicine may be given to you by mouth or through an IV tube.  Your temperature, blood pressure, heart rate, breathing rate, and blood oxygen level will be checked often.  Your blood may be tested to   see how you are responding to the transfusion. This information is not intended to replace advice given to you by your health care provider. Make sure you discuss any questions you have with your health care provider. Document Released: 12/02/2008 Document Revised: 04/29/2016 Document Reviewed: 04/29/2016 Elsevier Interactive Patient Education  2017 [employer]  

## 2016-11-14 LAB — TYPE AND SCREEN
Blood Product Expiration Date: 201803212359
ISSUE DATE / TIME: 201802241051
UNIT TYPE AND RH: 5100

## 2016-11-17 ENCOUNTER — Telehealth: Payer: Self-pay | Admitting: Hematology

## 2016-11-17 NOTE — Telephone Encounter (Signed)
Patient wanted to reschedule lab appt from 3/2 to 3/7. Called and made patient aware that we are unable to change it due to the injection appt on 11/18/2016

## 2016-11-18 ENCOUNTER — Telehealth: Payer: Self-pay | Admitting: Hematology

## 2016-11-18 ENCOUNTER — Ambulatory Visit (HOSPITAL_BASED_OUTPATIENT_CLINIC_OR_DEPARTMENT_OTHER): Payer: Medicare Other

## 2016-11-18 ENCOUNTER — Other Ambulatory Visit (HOSPITAL_BASED_OUTPATIENT_CLINIC_OR_DEPARTMENT_OTHER): Payer: Medicare Other

## 2016-11-18 DIAGNOSIS — D469 Myelodysplastic syndrome, unspecified: Secondary | ICD-10-CM | POA: Diagnosis not present

## 2016-11-18 DIAGNOSIS — D61818 Other pancytopenia: Secondary | ICD-10-CM | POA: Diagnosis not present

## 2016-11-18 DIAGNOSIS — D519 Vitamin B12 deficiency anemia, unspecified: Secondary | ICD-10-CM | POA: Diagnosis not present

## 2016-11-18 LAB — CBC & DIFF AND RETIC
BASO%: 0 % (ref 0.0–2.0)
Basophils Absolute: 0 10*3/uL (ref 0.0–0.1)
EOS%: 1.1 % (ref 0.0–7.0)
Eosinophils Absolute: 0 10*3/uL (ref 0.0–0.5)
HCT: 33.2 % — ABNORMAL LOW (ref 34.8–46.6)
HGB: 10.9 g/dL — ABNORMAL LOW (ref 11.6–15.9)
IMMATURE RETIC FRACT: 2.9 % (ref 1.60–10.00)
LYMPH#: 0.9 10*3/uL (ref 0.9–3.3)
LYMPH%: 49.5 % (ref 14.0–49.7)
MCH: 34.4 pg — ABNORMAL HIGH (ref 25.1–34.0)
MCHC: 32.8 g/dL (ref 31.5–36.0)
MCV: 104.7 fL — ABNORMAL HIGH (ref 79.5–101.0)
MONO#: 0.1 10*3/uL (ref 0.1–0.9)
MONO%: 4.3 % (ref 0.0–14.0)
NEUT#: 0.9 10*3/uL — ABNORMAL LOW (ref 1.5–6.5)
NEUT%: 45.1 % (ref 38.4–76.8)
PLATELETS: 748 10*3/uL — AB (ref 145–400)
RBC: 3.17 10*6/uL — AB (ref 3.70–5.45)
RDW: 18.8 % — ABNORMAL HIGH (ref 11.2–14.5)
RETIC CT ABS: 32.97 10*3/uL — AB (ref 33.70–90.70)
Retic %: 1.04 % (ref 0.70–2.10)
WBC: 1.9 10*3/uL — ABNORMAL LOW (ref 3.9–10.3)

## 2016-11-18 LAB — CHROMOSOME ANALYSIS, BONE MARROW

## 2016-11-18 LAB — TISSUE HYBRIDIZATION (BONE MARROW)-NCBH

## 2016-11-18 MED ORDER — DARBEPOETIN ALFA 150 MCG/0.3ML IJ SOSY
150.0000 ug | PREFILLED_SYRINGE | Freq: Once | INTRAMUSCULAR | Status: AC
  Administered 2016-11-18: 150 ug via SUBCUTANEOUS
  Filled 2016-11-18: qty 0.3

## 2016-11-18 MED ORDER — CYANOCOBALAMIN 1000 MCG/ML IJ SOLN
1000.0000 ug | Freq: Once | INTRAMUSCULAR | Status: AC
  Administered 2016-11-18: 1000 ug via INTRAMUSCULAR

## 2016-11-18 NOTE — Patient Instructions (Signed)

## 2016-11-18 NOTE — Telephone Encounter (Signed)
Left Message with husband about new time and date. Lab/MD/Injection r/s for 3/19 from 3/16.

## 2016-11-23 ENCOUNTER — Ambulatory Visit (HOSPITAL_COMMUNITY)
Admission: RE | Admit: 2016-11-23 | Discharge: 2016-11-23 | Disposition: A | Discharge status: Home / Self Care | Payer: Medicare Other | Source: Ambulatory Visit | Attending: Hematology | Admitting: Hematology

## 2016-11-23 DIAGNOSIS — D469 Myelodysplastic syndrome, unspecified: Secondary | ICD-10-CM | POA: Insufficient documentation

## 2016-11-23 DIAGNOSIS — K7689 Other specified diseases of liver: Secondary | ICD-10-CM | POA: Diagnosis not present

## 2016-11-23 DIAGNOSIS — N281 Cyst of kidney, acquired: Secondary | ICD-10-CM | POA: Insufficient documentation

## 2016-11-24 ENCOUNTER — Encounter (HOSPITAL_COMMUNITY): Payer: Self-pay

## 2016-11-24 ENCOUNTER — Telehealth: Payer: Self-pay

## 2016-11-24 NOTE — Telephone Encounter (Signed)
Weakness, fatigue, just has to go to bed. She had to cancel a dinner date yesterday. She had a bout of diarrhea yesterday. This has been since yesterday. She got blood on 2/24 and felt better for awhile. She received aranesp on 3/2. She is feeling very uninformed on what to expect and how to handle this new dx.

## 2016-11-24 NOTE — Telephone Encounter (Signed)
Unable to reach pt on home #. Left message on husband's mobile OK to take imodium up to 4/d & will try to call again in am.

## 2016-11-25 ENCOUNTER — Telehealth: Payer: Self-pay | Admitting: *Deleted

## 2016-11-25 NOTE — Telephone Encounter (Signed)
Tried to reach pt at home #, & husband's mobile # & unable to reach pt.  Left message on husband's mobile to call us back next week if she had concerns.

## 2016-12-02 ENCOUNTER — Telehealth: Payer: Self-pay | Admitting: Hematology

## 2016-12-02 ENCOUNTER — Ambulatory Visit: Payer: Medicare Other

## 2016-12-02 ENCOUNTER — Ambulatory Visit: Payer: Medicare Other | Admitting: Hematology

## 2016-12-02 ENCOUNTER — Other Ambulatory Visit: Payer: Medicare Other

## 2016-12-02 NOTE — Telephone Encounter (Signed)
Patient presented to scheduling to change her upcoming appointment times to an earlier time, due to caring for her sick husband. Appointments was rescheduled to begin at an earlier time! 12/02/16

## 2016-12-02 NOTE — Progress Notes (Signed)
[name]  [phone] Fax:([phone]) [phone]  Clinic Follow up Note   Patient Care Team: Leanna Battles, MD as PCP - General (Internal Medicine) Brand Males, MD as Consulting Physician (Pulmonary Disease) [name], MD as Consulting Physician (Orthopedic Surgery) 12/05/2016  CHIEF COMPLAIN: f/u MDS/MPN  HISTORY OF PRESENT ILLNESS (From Dr. Laverle Patter note on 10/11/2016): Lori [name] 83 y.o. female is here for evaluation of leukopenia. Patient states that she's been having severe fatigue since November 2017. She was having trouble with her atrial fibrillation again, however this has improved. CBC from 12/617 demonstrated WBC 4.83K, hemoglobin 10.9 g/dL, hematocrit 31.8%, MCV 105.4, place a count 764K. CBC from 04/11/16 demonstrated WBC 4.1K, hemoglobin 12.5 g/dL, hematocrit 38.1%, platelet count 693K. Most recent CBC from 09/28/16 demonstrated worsening of her leukopenia and anemia with WBC 2K, hemoglobin 9.6 g/dL, hematocrit 28.5%, MCV 107, platelet count 748K. She also has diarrhea about 3-4 times a day, however she states that this is been a lifelong problem for her. She has lost about 5 pounds in the past 2 months. She also has occasional shortness of breath. She denies any recent changes in her medications.  CURRENT THERAPY:  1. Aranesp 131mg every 2-4 weeks, started on 11/18/2016 2. Anagrelide 0.534mbid started on 11/11/2016, increased to 1.67m78mn am and 0.5mg16m pm on 12/06/2016  INTERVAL HISTORY: She returns for follow up. She is having a hard time with her hearing aids today. She is doing well otherwise. She has been having a hard time falling asleep. Denies night sweats. Denies fatigue, loss of appetite, or any other concerns.   REVIEW OF SYSTEMS:   Constitutional: Denies fevers, chills or abnormal weight loss Eyes: Denies blurriness of vision Ears, nose, mouth, throat, and face: Denies mucositis or sore throat (+) hearing loss Respiratory: Denies cough,  dyspnea or wheezes  Cardiovascular: Denies palpitation, chest discomfort or lower extremity swelling Gastrointestinal:  Denies nausea, heartburn or change in bowel habits  Skin: Denies abnormal skin rashes Lymphatics: Denies new lymphadenopathy  Neurological:Denies numbness, tingling or new weaknesses Behavioral/Psych: Mood is stable, no new changes  All other systems were reviewed with the patient and are negative.  MEDICAL HISTORY:  Past Medical History:  Diagnosis Date  . ALLERGIC RHINITIS   . Anxiety   . Asthma   . Atrial fibrillation (HCC)Adin. Back pain of thoracolumbar region    right  . Bronchiectasis   . Colles' fracture of left radius   . COPD (chronic obstructive pulmonary disease) (HCC)Fidelis. Coronary artery disease   . Diastolic dysfunction   . Diverticulosis   . Dysrhythmia   . History of pneumonia   . Hx of cardiovascular stress test    a. ETT-MV 1/14: Exercised 4:16, no ECG changes, poor ex tol, ant defect c/w soft tissue atten, no ischemia, EF 75%  . Hx of colonic polyp   . Hypertension   . IBS (irritable bowel syndrome)   . Insomnia   . Multiple rib fractures 10/2008   bilateral  . Pleural effusion, left   . Pruritus   . Pulmonary nodule     SURGICAL HISTORY: Past Surgical History:  Procedure Laterality Date  . ANKLE CLOSED REDUCTION Right 12/11/2014   Procedure: CLOSED REDUCTION ANKLE;  Surgeon: BriaRod Can;  [facility];  Laterality: Right;  . APPENDECTOMY    . BLADDER SUSPENSION  2005   A-P  . CHOLECYSTECTOMY    . COLONOSCOPY  04/06/2012  Procedure: COLONOSCOPY;  Surgeon: Jerene Bears, MD;  Location: [facility];  Service: Gastroenterology;  Laterality: N/A;  . EXTERNAL FIXATION LEG Right 12/12/2014   Procedure: ADJUSTMENT OF EXTERNAL FIXATION  RIGHT ANKLE;  Surgeon: Rod Can, MD;  Location: [facility];  Service: Orthopedics;  Laterality: Right;  . EXTERNAL FIXATION LEG Right 12/11/2014   Procedure: POSSIBLE  EXTERNAL FIXATION RIGHT ANKLE;  Surgeon: Rod Can, MD;  Location: [facility];  Service: Orthopedics;  Laterality: Right;  . HARDWARE REMOVAL Right 12/25/2014   Procedure: RIGHT ANKLE REMOVE OF DEEP IMPLANT ANE EXTERNAL FIXATOR ;  Surgeon: [name], MD;  Location: [facility];  Service: Orthopedics;  Laterality: Right;  . ORIF ANKLE FRACTURE Right 12/25/2014   Procedure: OPEN REDUCTION INTERNAL FIXATION (ORIF)TRIMALLEOLAR  ANKLE FRACTURE;  Surgeon: [name], MD;  Location: [facility];  Service: Orthopedics;  Laterality: Right;  . ORIF WRIST FRACTURE Left 2010     Dr. Burney Gauze.  Marland Kitchen POLYPECTOMY    . ROTATOR CUFF REPAIR Right 2006  . TONSILLECTOMY    . TONSILLECTOMY    . TOTAL KNEE ARTHROPLASTY Right 08/2010    I have reviewed the social history and family history with the patient and they are unchanged from previous note.  ALLERGIES:  is allergic to albuterol sulfate; levaquin [levofloxacin in d5w]; doxycycline; lactose intolerance (gi); amoxicillin; codeine; penicillins; sulfa antibiotics; and sulfonamide derivatives.  MEDICATIONS:  Current Outpatient Prescriptions  Medication Sig Dispense Refill  . anagrelide (AGRYLIN) 0.5 MG capsule Take 2 tab in morning and 1 tab in evening 90 capsule 1  . aspirin EC 81 MG tablet Take 1 tablet (81 mg total) by mouth daily. 90 tablet 3  . bimatoprost (LUMIGAN) 0.01 % SOLN Place 1 drop into both eyes daily. 5 mL 3  . dicyclomine (BENTYL) 10 MG capsule Take 1 capsule (10 mg total) by mouth 3 (three) times daily as needed. IBS 90 capsule 3  . diltiazem (CARDIZEM CD) 240 MG 24 hr capsule Take 1 capsule (240 mg total) by mouth daily. 30 capsule 3  . loperamide (IMODIUM) 2 MG capsule Take 2 mg by mouth as needed for diarrhea or loose stools.     . Magnesium Oxide (MAGOX 400 PO) Take 1 tablet by mouth daily as needed (leg cramps).    . valsartan-hydrochlorothiazide (DIOVAN-HCT) 80-12.5 MG tablet Take 1 tablet by mouth daily. 30  tablet 6  . zolpidem (AMBIEN) 5 MG tablet Take 1 tablet (5 mg total) by mouth at bedtime as needed for sleep. 30 tablet 5  . budesonide-formoterol (SYMBICORT) 160-4.5 MCG/ACT inhaler Inhale 2 puffs into the lungs as needed (allergies).    . clindamycin (CLEOCIN) 300 MG capsule Take 600 mg by mouth once. For dental propcedures    . LYSINE PO Take by mouth.     No current facility-administered medications for this visit.     PHYSICAL EXAMINATION: ECOG PERFORMANCE STATUS: 2-3  Vitals:   12/05/16 1112  Pulse: 73  Resp: 18  Temp: 97.7 F (36.5 C)   Filed Weights   12/05/16 1112  Weight: 134 lb 14.4 oz (61.2 kg)    GENERAL:alert, no distress and comfortable SKIN: skin color, texture, turgor are normal, no rashes or significant lesions EYES: normal, Conjunctiva are pink and non-injected, sclera clear OROPHARYNX:no exudate, no erythema and lips, buccal mucosa, and tongue normal  NECK: supple, thyroid normal size, non-tender, without nodularity LYMPH:  no palpable lymphadenopathy in the cervical, axillary or inguinal LUNGS: clear to auscultation and percussion  with normal breathing effort HEART: regular rate & rhythm and no murmurs and no lower extremity edema ABDOMEN:abdomen soft, non-tender and normal bowel sounds Musculoskeletal:no cyanosis of digits and no clubbing  NEURO: alert & oriented x 3 with fluent speech, no focal motor/sensory deficits  LABORATORY DATA:  I have reviewed the data as listed CBC Latest Ref Rng & Units 12/05/2016 11/18/2016 11/11/2016  WBC 3.9 - 10.3 10e3/uL 1.8(L) 1.9(L) 1.8(L)  Hemoglobin 11.6 - 15.9 g/dL 11.3(L) 10.9(L) 9.1(L)  Hematocrit 34.8 - 46.6 % 33.9(L) 33.2(L) 27.6(L)  Platelets 145 - 400 10e3/uL 794(H) 748(H) 873(H)     CMP Latest Ref Rng & Units 11/11/2016 10/11/2016 09/28/2016  Glucose 70 - 140 mg/dl 100 - 116(H)  BUN 7.0 - 26.0 mg/dL 28.1(H) - 18  Creatinine 0.6 - 1.1 mg/dL 0.8 - 0.60  Sodium 136 - 145 mEq/L 138 - 141  Potassium 3.5 - 5.1  mEq/L 5.0 - 4.4  Chloride 96 - 106 mmol/L - - 101  CO2 22 - 29 mEq/L 26 - 23  Calcium 8.4 - 10.4 mg/dL 9.5 - 9.1  Total Protein 6.4 - 8.3 g/dL 6.6 6.5 -  Total Bilirubin 0.20 - 1.20 mg/dL 0.61 - -  Alkaline Phos 40 - 150 U/L 80 - -  AST 5 - 34 U/L 11 - -  ALT 0 - 55 U/L 12 - -   PATHOLOGY:  11/14/2016   Diagnosis 11/07/2016 Bone Marrow, Aspirate,Biopsy, and Clot, left iliac crest - HYPERCELLULAR BONE MARROW FOR AGE WITH FEATURES OF MYELOID NEOPLASM. - SEE COMMENT. PERIPHERAL BLOOD: - MACROCYTIC ANEMIA. - LEUKOPENIA. - MARKED THROMBOCYTOSIS. Diagnosis Note The sections show a hypercellular bone marrow for age with dyspoietic changes involving myeloid cell lines associated with prominent increase in megakaryocytes and peripheral thrombocytosis. Definite increase in blastic cells is not identified by morphology, immunohistochemistry or flow cytometric studies. The overall features are most compatible with a myelodysplastic/myeloproliferative neoplasm. Correlation with cytogenetic and FISH studies is recommended. (BNS:kh 11-08-16)  RADIOGRAPHIC STUDIES: I have personally reviewed the radiological images as listed and agreed with the findings in the report.  US Abdomen 11/23/2016 IMPRESSION: No acute abnormality of the liver or spleen. A 7 mm cyst in the right hepatic lobe is observed.  No gallstones or sonographic evidence of acute cholecystitis.  Bilateral renal cysts.  ASSESSMENT & PLAN:  83 y.o. female presents With worsening fatigue and dyspnea on exertion since November 2017, CBC reviewed severe neutropenia, moderate anemia, and significant thrombocytosis.  1. 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 Talbert Cage, I reviewed the lab results with her today. -Her iron study, vitamin I14, folic acid levels are normal, however she does have elevated methylmalonic acid level, support mild B12 deficiency. I do not  think her B12 deficiency can explain her abnormal CBC, especially thrombocytosis, however it may contribute to her severe fatigue. I recommend her to start B12 injection today and continue monthly. She does take B12 orally. -No lab evidence of hemolysis, haptoglobin was normal, LDH was normal -SPEP was negative for M protein -I reviewed her bone marrow biopsy with patient and her husband, which showed MDS and MPN, no evidence for myelofibrosis. -We previously reviewed the nature history of MDS and MPN, unfortunately this is not curable disease, but is very treatable. -Her cytogenetics is normal, her MDS IPSS-R score 2.5, low risk, which predicts median survival of 5 years. Due to her MPN, her overall risk is probably little higher  -  She has started Aranesp 176mg every 2 weeks, responded well, hemoglobin 11.3 today, no need injection today -She has started anagrelide 0.5 mg twice daily, tolerating well, thrombocytosis has improved, but still high overall, I'll increase her dose to 1 mg in the morning, and 0.5 mg in the evening -I encouraged her to continue aspirin, to reduce her risk of thrombosis. -Aranesp injection every 2 weeks if Hb<11  -I reviewed the [place] in [place]. No splenomagely  -continue monitoring lab every 2 weeks   2.  Severe fatigue and dyspnea on exertion -Likely related to her moderate anemia, and underlying MDS/MPN -much improved lately  -Her hemoglobin 11.3 today  3. Mild B12 deficiency -She has normal E 12 level, however methylmalonic acid level was elevated significantly -I have start her on B12 injection monthly  4. Depression and Anxiety -Over diagnosis and not being able to do all of the activities she use to do  5. Insomnia -I suggested melatonin.   Plan -No Aranesp injection today due to Hgb being above 11.  -Increase anagrelide dose to 2 tab (1.09m in the morning and 1 in the evening. Refilled today.  -Labs and injection every 2 weeks.  -B12  injection monthly, next April 1 -Return to clinic in 2 months.   All questions were answered. The patient knows to call the clinic with any problems, questions or concerns. No barriers to learning was detected.  I spent 25 minutes counseling the patient face to face. The total time spent in the appointment was 30 minutes and more than 50% was on counseling and review of test results  This document serves as a record of services personally performed by YaTruitt MerleMD. It was created on her behalf by [email or web address], a trained medical scribe. The creation of this record is based on the scribe's personal observations and the provider's statements to them. This document has been checked and approved by the attending provider.  I have reviewed the above documentation for accuracy and completeness and I agree with the above.    FeTruitt MerleMD

## 2016-12-05 ENCOUNTER — Other Ambulatory Visit (HOSPITAL_BASED_OUTPATIENT_CLINIC_OR_DEPARTMENT_OTHER): Payer: Medicare Other

## 2016-12-05 ENCOUNTER — Ambulatory Visit: Payer: Medicare Other

## 2016-12-05 ENCOUNTER — Other Ambulatory Visit: Payer: Medicare Other

## 2016-12-05 ENCOUNTER — Ambulatory Visit (HOSPITAL_BASED_OUTPATIENT_CLINIC_OR_DEPARTMENT_OTHER): Payer: Medicare Other | Admitting: Hematology

## 2016-12-05 ENCOUNTER — Telehealth: Payer: Self-pay | Admitting: Hematology

## 2016-12-05 ENCOUNTER — Ambulatory Visit: Payer: Medicare Other | Admitting: Hematology

## 2016-12-05 VITALS — HR 73 | Temp 97.7°F | Resp 18 | Ht 65.0 in | Wt 134.9 lb

## 2016-12-05 DIAGNOSIS — D649 Anemia, unspecified: Secondary | ICD-10-CM

## 2016-12-05 DIAGNOSIS — D473 Essential (hemorrhagic) thrombocythemia: Secondary | ICD-10-CM | POA: Diagnosis not present

## 2016-12-05 DIAGNOSIS — D61818 Other pancytopenia: Secondary | ICD-10-CM

## 2016-12-05 DIAGNOSIS — I10 Essential (primary) hypertension: Secondary | ICD-10-CM | POA: Diagnosis not present

## 2016-12-05 DIAGNOSIS — E538 Deficiency of other specified B group vitamins: Secondary | ICD-10-CM | POA: Diagnosis not present

## 2016-12-05 DIAGNOSIS — D469 Myelodysplastic syndrome, unspecified: Secondary | ICD-10-CM

## 2016-12-05 LAB — CBC & DIFF AND RETIC
BASO%: 0 % (ref 0.0–2.0)
BASOS ABS: 0 10*3/uL (ref 0.0–0.1)
EOS%: 1.1 % (ref 0.0–7.0)
Eosinophils Absolute: 0 10*3/uL (ref 0.0–0.5)
HCT: 33.9 % — ABNORMAL LOW (ref 34.8–46.6)
HEMOGLOBIN: 11.3 g/dL — AB (ref 11.6–15.9)
Immature Retic Fract: 2.8 % (ref 1.60–10.00)
LYMPH%: 51.1 % — AB (ref 14.0–49.7)
MCH: 34.6 pg — AB (ref 25.1–34.0)
MCHC: 33.3 g/dL (ref 31.5–36.0)
MCV: 103.7 fL — AB (ref 79.5–101.0)
MONO#: 0.1 10*3/uL (ref 0.1–0.9)
MONO%: 4.4 % (ref 0.0–14.0)
NEUT#: 0.8 10*3/uL — ABNORMAL LOW (ref 1.5–6.5)
NEUT%: 43.4 % (ref 38.4–76.8)
NRBC: 0 % (ref 0–0)
PLATELETS: 794 10*3/uL — AB (ref 145–400)
RBC: 3.27 10*6/uL — ABNORMAL LOW (ref 3.70–5.45)
RDW: 17.1 % — AB (ref 11.2–14.5)
Retic %: 1.25 % (ref 0.70–2.10)
Retic Ct Abs: 40.88 10*3/uL (ref 33.70–90.70)
WBC: 1.8 10*3/uL — AB (ref 3.9–10.3)
lymph#: 0.9 10*3/uL (ref 0.9–3.3)

## 2016-12-05 MED ORDER — ANAGRELIDE HCL 0.5 MG PO CAPS: ORAL_CAPSULE | ORAL | 1 refills | Status: DC

## 2016-12-05 NOTE — Telephone Encounter (Signed)
Gave patient AVS and schedule per 12/05/2016 los

## 2016-12-06 ENCOUNTER — Encounter: Payer: Self-pay | Admitting: Hematology

## 2016-12-12 ENCOUNTER — Ambulatory Visit: Payer: Medicare Other | Admitting: Hematology

## 2016-12-12 ENCOUNTER — Other Ambulatory Visit: Payer: Medicare Other

## 2016-12-14 ENCOUNTER — Encounter: Payer: Self-pay | Admitting: Nurse Practitioner

## 2016-12-19 ENCOUNTER — Telehealth: Payer: Self-pay | Admitting: Hematology

## 2016-12-19 ENCOUNTER — Other Ambulatory Visit (HOSPITAL_BASED_OUTPATIENT_CLINIC_OR_DEPARTMENT_OTHER): Payer: Medicare Other

## 2016-12-19 ENCOUNTER — Other Ambulatory Visit: Payer: Medicare Other

## 2016-12-19 ENCOUNTER — Ambulatory Visit: Payer: Medicare Other

## 2016-12-19 ENCOUNTER — Ambulatory Visit (HOSPITAL_BASED_OUTPATIENT_CLINIC_OR_DEPARTMENT_OTHER): Payer: Medicare Other

## 2016-12-19 VITALS — BP 141/62 | HR 99 | Temp 97.5°F | Resp 20

## 2016-12-19 DIAGNOSIS — D469 Myelodysplastic syndrome, unspecified: Secondary | ICD-10-CM | POA: Diagnosis not present

## 2016-12-19 DIAGNOSIS — D519 Vitamin B12 deficiency anemia, unspecified: Secondary | ICD-10-CM

## 2016-12-19 DIAGNOSIS — D61818 Other pancytopenia: Secondary | ICD-10-CM

## 2016-12-19 LAB — CBC & DIFF AND RETIC
BASO%: 0.4 % (ref 0.0–2.0)
Basophils Absolute: 0 10*3/uL (ref 0.0–0.1)
EOS%: 0.7 % (ref 0.0–7.0)
Eosinophils Absolute: 0 10*3/uL (ref 0.0–0.5)
HCT: 28.2 % — ABNORMAL LOW (ref 34.8–46.6)
HGB: 9.4 g/dL — ABNORMAL LOW (ref 11.6–15.9)
Immature Retic Fract: 6.3 % (ref 1.60–10.00)
LYMPH%: 41.5 % (ref 14.0–49.7)
MCH: 33.8 pg (ref 25.1–34.0)
MCHC: 33.3 g/dL (ref 31.5–36.0)
MCV: 101.4 fL — AB (ref 79.5–101.0)
MONO#: 0.1 10*3/uL (ref 0.1–0.9)
MONO%: 3.2 % (ref 0.0–14.0)
NEUT%: 54.2 % (ref 38.4–76.8)
NEUTROS ABS: 1.5 10*3/uL (ref 1.5–6.5)
PLATELETS: 590 10*3/uL — AB (ref 145–400)
RBC: 2.78 10*6/uL — AB (ref 3.70–5.45)
RDW: 16.5 % — ABNORMAL HIGH (ref 11.2–14.5)
Retic %: 1.73 % (ref 0.70–2.10)
Retic Ct Abs: 48.09 10*3/uL (ref 33.70–90.70)
WBC: 2.8 10*3/uL — ABNORMAL LOW (ref 3.9–10.3)
lymph#: 1.2 10*3/uL (ref 0.9–3.3)
nRBC: 0 % (ref 0–0)

## 2016-12-19 MED ORDER — CYANOCOBALAMIN 1000 MCG/ML IJ SOLN
1000.0000 ug | Freq: Once | INTRAMUSCULAR | Status: AC
  Administered 2016-12-19: 1000 ug via INTRAMUSCULAR

## 2016-12-19 MED ORDER — DARBEPOETIN ALFA 150 MCG/0.3ML IJ SOSY
150.0000 ug | PREFILLED_SYRINGE | Freq: Once | INTRAMUSCULAR | Status: AC
  Administered 2016-12-19: 150 ug via SUBCUTANEOUS
  Filled 2016-12-19: qty 0.3

## 2016-12-19 NOTE — Telephone Encounter (Signed)
Patient came by scheduling to reschedule 5.14.18 appointments.

## 2016-12-19 NOTE — Patient Instructions (Signed)

## 2016-12-22 ENCOUNTER — Telehealth: Payer: Self-pay | Admitting: *Deleted

## 2016-12-22 NOTE — Telephone Encounter (Signed)
Spoke with pt and asked if pt could come in at 4 pm on Friday 12/23/16 for office visit with Dr. Burr Medico.  Pt agreed.  Schedule message sent .

## 2016-12-22 NOTE — Telephone Encounter (Signed)
Thu, please call her back to see if she is willing to come to see me tomorrow 8:15 or 4pm, no lab, or next Friday  Thanks   Truitt Merle MD

## 2016-12-22 NOTE — Telephone Encounter (Signed)
Patient called stating that she would like to speak to MD Feng/nurse regarding her health status. She is currently at [facility] and is currently have issues with mobility/energy/and occasional episodes of nearly passing out. She states that she basically stay in bed most of the day.

## 2016-12-23 ENCOUNTER — Encounter: Payer: Self-pay | Admitting: Hematology

## 2016-12-23 ENCOUNTER — Ambulatory Visit (HOSPITAL_BASED_OUTPATIENT_CLINIC_OR_DEPARTMENT_OTHER): Payer: Medicare Other | Admitting: Hematology

## 2016-12-23 VITALS — BP 160/64 | HR 97 | Temp 97.7°F | Resp 18 | Ht 65.0 in | Wt 136.0 lb

## 2016-12-23 DIAGNOSIS — G4700 Insomnia, unspecified: Secondary | ICD-10-CM

## 2016-12-23 DIAGNOSIS — D469 Myelodysplastic syndrome, unspecified: Secondary | ICD-10-CM

## 2016-12-23 DIAGNOSIS — E538 Deficiency of other specified B group vitamins: Secondary | ICD-10-CM

## 2016-12-23 DIAGNOSIS — F329 Major depressive disorder, single episode, unspecified: Secondary | ICD-10-CM | POA: Diagnosis not present

## 2016-12-23 DIAGNOSIS — R0600 Dyspnea, unspecified: Secondary | ICD-10-CM

## 2016-12-23 DIAGNOSIS — R5383 Other fatigue: Secondary | ICD-10-CM

## 2016-12-23 DIAGNOSIS — F419 Anxiety disorder, unspecified: Secondary | ICD-10-CM | POA: Diagnosis not present

## 2016-12-23 MED ORDER — NITROFURANTOIN MONOHYD MACRO 100 MG PO CAPS: 100.0000 mg | ORAL_CAPSULE | Freq: Two times a day (BID) | ORAL | 0 refills | Status: DC

## 2016-12-23 MED ORDER — BUDESONIDE-FORMOTEROL FUMARATE 160-4.5 MCG/ACT IN AERO: 2.0000 | INHALATION_SPRAY | RESPIRATORY_TRACT | 2 refills | Status: DC | PRN

## 2016-12-23 NOTE — Progress Notes (Signed)
Lori [name]  [phone] [phone]  Clinic Follow up Note   Patient Care Team: Leanna Battles, MD as PCP - General (Internal Medicine) Brand Males, MD as Consulting Physician (Pulmonary Disease) [name], MD as Consulting Physician (Orthopedic Surgery) 12/23/2016  CHIEF COMPLAIN: Fatigue  HISTORY OF PRESENT ILLNESS (From Dr. Laverle Patter note on 10/11/2016): Lori [name] 83 y.o. female is here for evaluation of leukopenia. Patient states that she's been having severe fatigue since November 2017. She was having trouble with her atrial fibrillation again, however this has improved. CBC from 12/617 demonstrated WBC 4.83K, hemoglobin 10.9 g/dL, hematocrit 31.8%, MCV 105.4, place a count 764K. CBC from 04/11/16 demonstrated WBC 4.1K, hemoglobin 12.5 g/dL, hematocrit 38.1%, platelet count 693K. Most recent CBC from 09/28/16 demonstrated worsening of her leukopenia and anemia with WBC 2K, hemoglobin 9.6 g/dL, hematocrit 28.5%, MCV 107, platelet count 748K. She also has diarrhea about 3-4 times a day, however she states that this is been a lifelong problem for her. She has lost about 5 pounds in the past 2 months. She also has occasional shortness of breath. She denies any recent changes in her medications.  CURRENT THERAPY:  1. Aranesp 151mg every 2 weeks, started on 11/18/2016 2. Anagrelide 0.564mBID started on 11/11/2016, increased to 1.105m49mn am and 0.5mg38m pm on 12/06/2016  INTERVAL HISTORY: She returns for follow up with her husband. She wanted to be seen regarding her health status. She has issues with mobility/energy and occasional near syncope. She would stay in bed most of the day and unable to do her activity. She reports feeling like she needed to throw up last Thursday multiple times, but unable to. She also had chest pain. She called the paramedics and it was discovered she had a bad case of indigestion. She would feel well in the morning, but then would  have to go back to sleep a few hours after waking. She is trying to stop driving because she gets dizzy while driving. The patient states she feels bad to the point she feels like she is dying. The patient's Hb on 12/19/16 was 9.4. She had an Aranesp injection that day. She reports urinating a small amount and then it stops. She reports this has been happening for a month. She reports a productive cough with a lot of sputum. The sputum would sometimes be yellow. She denies fever. She is allergic to many antibiotics, but reports being able to take Cipro.  REVIEW OF SYSTEMS:   Constitutional: Denies fevers, chills or abnormal weight loss (+) Fatigue (+) Occasional near syncope. Eyes: Denies blurriness of vision Ears, nose, mouth, throat, and face: Denies mucositis or sore throat (+) hearing loss Respiratory: Denies cough, dyspnea or wheezes  Cardiovascular: Denies palpitation, chest discomfort or lower extremity swelling Gastrointestinal:  Denies nausea, heartburn or change in bowel habits  Skin: Denies abnormal skin rashes Lymphatics: Denies new lymphadenopathy  Neurological:Denies numbness, tingling or new weaknesses MSK: (+) Ambulatory with a walker. Behavioral/Psych: Mood is stable, no new changes  All other systems were reviewed with the patient and are negative.  MEDICAL HISTORY:  Past Medical History:  Diagnosis Date  . ALLERGIC RHINITIS   . Anxiety   . Asthma   . Atrial fibrillation (HCC)[place]. Back pain of thoracolumbar region    right  . Bronchiectasis   . Colles' fracture of left radius   . COPD (chronic obstructive pulmonary disease) (HCC)[place]. Coronary artery disease   .  Diastolic dysfunction   . Diverticulosis   . Dysrhythmia   . History of pneumonia   . Hx of cardiovascular stress test    a. ETT-MV 1/14: Exercised 4:16, no ECG changes, poor ex tol, ant defect c/w soft tissue atten, no ischemia, EF 75%  . Hx of colonic polyp   . Hypertension   . IBS (irritable bowel  syndrome)   . Insomnia   . Multiple rib fractures 10/2008   bilateral  . Pleural effusion, left   . Pruritus   . Pulmonary nodule     SURGICAL HISTORY: Past Surgical History:  Procedure Laterality Date  . ANKLE CLOSED REDUCTION Right 12/11/2014   Procedure: CLOSED REDUCTION ANKLE;  Surgeon: Rod Can, MD;  Location: [facility];  Service: Orthopedics;  Laterality: Right;  . APPENDECTOMY    . BLADDER SUSPENSION  2005   A-P  . CHOLECYSTECTOMY    . COLONOSCOPY  04/06/2012   Procedure: COLONOSCOPY;  Surgeon: Jerene Bears, MD;  Location: [facility];  Service: Gastroenterology;  Laterality: N/A;  . EXTERNAL FIXATION LEG Right 12/12/2014   Procedure: ADJUSTMENT OF EXTERNAL FIXATION  RIGHT ANKLE;  Surgeon: Rod Can, MD;  Location: [place];  Service: Orthopedics;  Laterality: Right;  . EXTERNAL FIXATION LEG Right 12/11/2014   Procedure: POSSIBLE EXTERNAL FIXATION RIGHT ANKLE;  Surgeon: Rod Can, MD;  Location: [place];  Service: Orthopedics;  Laterality: Right;  . HARDWARE REMOVAL Right 12/25/2014   Procedure: RIGHT ANKLE REMOVE OF DEEP IMPLANT ANE EXTERNAL FIXATOR ;  Surgeon: [name], MD;  Location: [place];  Service: Orthopedics;  Laterality: Right;  . ORIF ANKLE FRACTURE Right 12/25/2014   Procedure: OPEN REDUCTION INTERNAL FIXATION (ORIF)TRIMALLEOLAR  ANKLE FRACTURE;  Surgeon: [name], MD;  Location: [place];  Service: Orthopedics;  Laterality: Right;  . ORIF WRIST FRACTURE Left 2010     Dr. Burney Gauze.  Marland Kitchen POLYPECTOMY    . ROTATOR CUFF REPAIR Right 2006  . TONSILLECTOMY    . TONSILLECTOMY    . TOTAL KNEE ARTHROPLASTY Right 08/2010    I have reviewed the social history and family history with the patient and they are unchanged from previous note.  ALLERGIES:  is allergic to albuterol sulfate; levaquin [levofloxacin in d5w]; doxycycline; lactose intolerance (gi); amoxicillin; codeine; penicillins; sulfa antibiotics; and sulfonamide  derivatives.  MEDICATIONS:  Current Outpatient Prescriptions  Medication Sig Dispense Refill  . anagrelide (AGRYLIN) 0.5 MG capsule Take 2 tab in morning and 1 tab in evening 90 capsule 1  . aspirin EC 81 MG tablet Take 1 tablet (81 mg total) by mouth daily. 90 tablet 3  . bimatoprost (LUMIGAN) 0.01 % SOLN Place 1 drop into both eyes daily. 5 mL 3  . budesonide-formoterol (SYMBICORT) 160-4.5 MCG/ACT inhaler Inhale 2 puffs into the lungs as needed (allergies). 1 Inhaler 2  . diltiazem (CARDIZEM CD) 240 MG 24 hr capsule Take 1 capsule (240 mg total) by mouth daily. 30 capsule 3  . loperamide (IMODIUM) 2 MG capsule Take 2 mg by mouth as needed for diarrhea or loose stools.     . valsartan-hydrochlorothiazide (DIOVAN-HCT) 80-12.5 MG tablet Take 1 tablet by mouth daily. 30 tablet 6  . zolpidem (AMBIEN) 5 MG tablet Take 1 tablet (5 mg total) by mouth at bedtime as needed for sleep. 30 tablet 5  . clindamycin (CLEOCIN) 300 MG capsule Take 600 mg by mouth once. For dental propcedures    . dicyclomine (BENTYL) 10 MG capsule Take 1 capsule (10  mg total) by mouth 3 (three) times daily as needed. IBS (Patient not taking: Reported on 12/23/2016) 90 capsule 3  . Magnesium Oxide (MAGOX 400 PO) Take 1 tablet by mouth daily as needed (leg cramps).    . nitrofurantoin, macrocrystal-monohydrate, (MACROBID) 100 MG capsule Take 1 capsule (100 mg total) by mouth 2 (two) times daily. 10 capsule 0   No current facility-administered medications for this visit.     PHYSICAL EXAMINATION: ECOG PERFORMANCE STATUS: 3  Vitals:   12/23/16 1606  BP: (!) 160/64  Pulse: 97  Resp: 18  Temp: 97.7 F (36.5 C)   Filed Weights   12/23/16 1606  Weight: 136 lb (61.7 kg)    GENERAL:alert, no distress and comfortable SKIN: skin color, texture, turgor are normal, no rashes or significant lesions EYES: normal, Conjunctiva are pink and non-injected, sclera clear OROPHARYNX:no exudate, no erythema and lips, buccal mucosa, and  tongue normal  EARS: (+) Hearing aids. NECK: supple, thyroid normal size, non-tender, without nodularity LYMPH:  no palpable lymphadenopathy in the cervical, axillary or inguinal LUNGS: clear to auscultation and percussion with normal breathing effort HEART: regular rate & rhythm and no murmurs and no lower extremity edema ABDOMEN:abdomen soft, non-tender and normal bowel sounds Musculoskeletal:no cyanosis of digits and no clubbing. (+) Ambulatory with a walker. NEURO: alert & oriented x 3 with fluent speech, no focal motor/sensory deficits  LABORATORY DATA:  I have reviewed the data as listed CBC Latest Ref Rng & Units 12/19/2016 12/05/2016 11/18/2016  WBC 3.9 - 10.3 10e3/uL 2.8(L) 1.8(L) 1.9(L)  Hemoglobin 11.6 - 15.9 g/dL 9.4(L) 11.3(L) 10.9(L)  Hematocrit 34.8 - 46.6 % 28.2(L) 33.9(L) 33.2(L)  Platelets 145 - 400 10e3/uL 590(H) 794(H) 748(H)     CMP Latest Ref Rng & Units 11/11/2016 10/11/2016 09/28/2016  Glucose 70 - 140 mg/dl 100 - 116(H)  BUN 7.0 - 26.0 mg/dL 28.1(H) - 18  Creatinine 0.6 - 1.1 mg/dL 0.8 - 0.60  Sodium 136 - 145 mEq/L 138 - 141  Potassium 3.5 - 5.1 mEq/L 5.0 - 4.4  Chloride 96 - 106 mmol/L - - 101  CO2 22 - 29 mEq/L 26 - 23  Calcium 8.4 - 10.4 mg/dL 9.5 - 9.1  Total Protein 6.4 - 8.3 g/dL 6.6 6.5 -  Total Bilirubin 0.20 - 1.20 mg/dL 0.61 - -  Alkaline Phos 40 - 150 U/L 80 - -  AST 5 - 34 U/L 11 - -  ALT 0 - 55 U/L 12 - -   PATHOLOGY:  11/14/2016   Diagnosis 11/07/2016 Bone Marrow, Aspirate,Biopsy, and Clot, left iliac crest - HYPERCELLULAR BONE MARROW FOR AGE WITH FEATURES OF MYELOID NEOPLASM. - SEE COMMENT. PERIPHERAL BLOOD: - MACROCYTIC ANEMIA. - LEUKOPENIA. - MARKED THROMBOCYTOSIS. Diagnosis Note The sections show a hypercellular bone marrow for age with dyspoietic changes involving myeloid cell lines associated with prominent increase in megakaryocytes and peripheral thrombocytosis. Definite increase in blastic cells is not identified by morphology,  immunohistochemistry or flow cytometric studies. The overall features are most compatible with a myelodysplastic/myeloproliferative neoplasm. Correlation with cytogenetic and FISH studies is recommended. (BNS:kh 11-08-16)  RADIOGRAPHIC STUDIES: I have personally reviewed the radiological images as listed and agreed with the findings in the report.  US Abdomen 11/23/2016 IMPRESSION: No acute abnormality of the liver or spleen. A 7 mm cyst in the right hepatic lobe is observed. No gallstones or sonographic evidence of acute cholecystitis. Bilateral renal cysts.  ASSESSMENT & PLAN:  83 y.o. female presents With worsening fatigue and dyspnea on  exertion since November 2017, CBC reviewed severe neutropenia, moderate anemia, and significant thrombocytosis.  1. 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 Talbert Cage, I reviewed the lab results with her. -Her iron study, vitamin S28, folic acid levels are normal, however she does have elevated methylmalonic acid level, support mild B12 deficiency. I do not think her B12 deficiency can explain her abnormal CBC, especially thrombocytosis, however it may contribute to her severe fatigue. I recommend her to start B12 injection today and continue monthly. She does take B12 orally. -No lab evidence of hemolysis, haptoglobin was normal, LDH was normal -SPEP was negative for M protein -I reviewed her bone marrow biopsy with patient and her husband, which showed MDS and MPN, no evidence for myelofibrosis. -We previously reviewed the nature history of MDS and MPN, unfortunately this is not curable disease, but is very treatable. -Her cytogenetics is normal, her MDS IPSS-R score 2.5, low risk, which predicts median survival of 5 years. Due to her MPN, her overall risk is probably little higher  -She has started Aranesp 147mg every 2 weeks, responded well. -She was on Anagrelide 0.5 mg twice daily,  tolerated well, thrombocytosis has improved, but still high overall. I increased her dose to 1 mg in the morning, and 0.5 mg in the evening starting 12/05/16. -I encouraged her to continue aspirin, to reduce her risk of thrombosis. -Aranesp injection every 2 weeks if Hb<11  -I previously reviewed the abdominal [place] 11/23[place]16 with the patient in detail. No splenomagely  -continue monitoring lab every 2 weeks -The patient started feeling poorly the week of 12/12/16. Labs reviewed, the patient's Hb was 9.4 on 12/19/16. She had an Aranesp injection then.  2.  Severe fatigue and dyspnea on exertion -Likely related to her moderate anemia, and underlying MDS/MPN -much improved after she started treatment, however her fatigue got much worse again since last week  -She also has dysuria and mild hematuria lately, no fever, I'm concerned about she may have UTI. She came in the late afternoon, our lab has closed. I'll prescribe Macrobid for presumed UTI. (She has multiple allergies to antibiotics) -Her hemoglobin 9.4 on 12/19/16, which may also contribute to her fatigue.  3. Mild B12 deficiency -She has normal E 12 level, however methylmalonic acid level was elevated significantly -I have start her on B12 injection monthly  4. Depression and Anxiety -Over diagnosis and not being able to do all of the activities she use to do  5. Insomnia -I suggested melatonin.  6. Possible UTI -The patient reports on 12/23/16 that she only urinates a small amount and this has been ongoing for a month, along with mild dysuria  -Did not get a urine sample that day to the appointment being late in the afternoon with the lab closed. -I prescribed Macrobid 100 mg BID, 10 capsules, on 12/23/16. She is to notify [place] it does not improve or she develops a fever.   Plan -Continue anagrelide dose to 2 tab (1.081m in the morning and 1 in the evening. -Labs every 2 weeks -Aranesp injection every 2 weeks if Hb<11. -B12 injection  monthly, next April 30. -Will move her f/u from 4/9 to 4/16. Appointment will be at 10:45AM. -I refilled the patient's Symbicort.   All questions were answered. The patient knows to call the clinic with any problems, questions or concerns. No barriers to learning was detected.  I spent 25 minutes counseling the patient  face to face. The total time spent in the appointment was 30 minutes and more than 50% was on counseling and review of test results    Truitt Merle, MD 12/23/2016    This document serves as a record of services personally performed by Truitt Merle, MD. It was created on her behalf by Darcus Austin, a trained medical scribe. The creation of this record is based on the scribe's personal observations and the provider's statements to them. This document has been checked and approved by the attending provider.

## 2016-12-25 ENCOUNTER — Encounter (HOSPITAL_COMMUNITY): Payer: Self-pay | Admitting: Emergency Medicine

## 2016-12-25 ENCOUNTER — Other Ambulatory Visit: Payer: Self-pay

## 2016-12-25 ENCOUNTER — Inpatient Hospital Stay (HOSPITAL_COMMUNITY)
Admission: EM | Admit: 2016-12-25 | Discharge: 2016-12-27 | DRG: 641 | Disposition: A | Discharge status: Home Health | Payer: Medicare Other | Attending: Internal Medicine | Admitting: Internal Medicine

## 2016-12-25 ENCOUNTER — Emergency Department (HOSPITAL_COMMUNITY): Payer: Medicare Other

## 2016-12-25 DIAGNOSIS — J449 Chronic obstructive pulmonary disease, unspecified: Secondary | ICD-10-CM | POA: Diagnosis present

## 2016-12-25 DIAGNOSIS — K589 Irritable bowel syndrome without diarrhea: Secondary | ICD-10-CM | POA: Diagnosis not present

## 2016-12-25 DIAGNOSIS — D469 Myelodysplastic syndrome, unspecified: Secondary | ICD-10-CM | POA: Diagnosis not present

## 2016-12-25 DIAGNOSIS — I4891 Unspecified atrial fibrillation: Secondary | ICD-10-CM | POA: Diagnosis not present

## 2016-12-25 DIAGNOSIS — Z888 Allergy status to other drugs, medicaments and biological substances status: Secondary | ICD-10-CM

## 2016-12-25 DIAGNOSIS — I5032 Chronic diastolic (congestive) heart failure: Secondary | ICD-10-CM | POA: Diagnosis present

## 2016-12-25 DIAGNOSIS — N390 Urinary tract infection, site not specified: Secondary | ICD-10-CM | POA: Diagnosis not present

## 2016-12-25 DIAGNOSIS — I471 Supraventricular tachycardia: Secondary | ICD-10-CM | POA: Diagnosis not present

## 2016-12-25 DIAGNOSIS — Z7982 Long term (current) use of aspirin: Secondary | ICD-10-CM

## 2016-12-25 DIAGNOSIS — F419 Anxiety disorder, unspecified: Secondary | ICD-10-CM | POA: Diagnosis present

## 2016-12-25 DIAGNOSIS — Z86010 Personal history of colonic polyps: Secondary | ICD-10-CM

## 2016-12-25 DIAGNOSIS — I10 Essential (primary) hypertension: Secondary | ICD-10-CM | POA: Diagnosis not present

## 2016-12-25 DIAGNOSIS — Z881 Allergy status to other antibiotic agents status: Secondary | ICD-10-CM

## 2016-12-25 DIAGNOSIS — D471 Chronic myeloproliferative disease: Secondary | ICD-10-CM

## 2016-12-25 DIAGNOSIS — Z885 Allergy status to narcotic agent status: Secondary | ICD-10-CM

## 2016-12-25 DIAGNOSIS — Z8249 Family history of ischemic heart disease and other diseases of the circulatory system: Secondary | ICD-10-CM | POA: Diagnosis not present

## 2016-12-25 DIAGNOSIS — Z882 Allergy status to sulfonamides status: Secondary | ICD-10-CM

## 2016-12-25 DIAGNOSIS — Z66 Do not resuscitate: Secondary | ICD-10-CM | POA: Diagnosis not present

## 2016-12-25 DIAGNOSIS — R911 Solitary pulmonary nodule: Secondary | ICD-10-CM | POA: Diagnosis present

## 2016-12-25 DIAGNOSIS — I2510 Atherosclerotic heart disease of native coronary artery without angina pectoris: Secondary | ICD-10-CM | POA: Diagnosis not present

## 2016-12-25 DIAGNOSIS — E860 Dehydration: Secondary | ICD-10-CM | POA: Diagnosis not present

## 2016-12-25 DIAGNOSIS — R0902 Hypoxemia: Secondary | ICD-10-CM | POA: Diagnosis not present

## 2016-12-25 DIAGNOSIS — Z79899 Other long term (current) drug therapy: Secondary | ICD-10-CM

## 2016-12-25 DIAGNOSIS — Z96651 Presence of right artificial knee joint: Secondary | ICD-10-CM | POA: Diagnosis present

## 2016-12-25 DIAGNOSIS — Z8701 Personal history of pneumonia (recurrent): Secondary | ICD-10-CM

## 2016-12-25 DIAGNOSIS — R000 Tachycardia, unspecified: Secondary | ICD-10-CM | POA: Diagnosis not present

## 2016-12-25 DIAGNOSIS — E876 Hypokalemia: Secondary | ICD-10-CM | POA: Diagnosis not present

## 2016-12-25 DIAGNOSIS — R55 Syncope and collapse: Secondary | ICD-10-CM | POA: Diagnosis present

## 2016-12-25 DIAGNOSIS — E739 Lactose intolerance, unspecified: Secondary | ICD-10-CM | POA: Diagnosis not present

## 2016-12-25 DIAGNOSIS — I4581 Long QT syndrome: Secondary | ICD-10-CM | POA: Diagnosis not present

## 2016-12-25 DIAGNOSIS — Z87891 Personal history of nicotine dependence: Secondary | ICD-10-CM | POA: Diagnosis not present

## 2016-12-25 DIAGNOSIS — I110 Hypertensive heart disease with heart failure: Secondary | ICD-10-CM | POA: Diagnosis not present

## 2016-12-25 DIAGNOSIS — Z880 Allergy status to penicillin: Secondary | ICD-10-CM

## 2016-12-25 LAB — BASIC METABOLIC PANEL
Anion gap: 9 (ref 5–15)
BUN: 23 mg/dL — AB (ref 6–20)
CALCIUM: 9.1 mg/dL (ref 8.9–10.3)
CO2: 23 mmol/L (ref 22–32)
CREATININE: 0.77 mg/dL (ref 0.44–1.00)
Chloride: 106 mmol/L (ref 101–111)
GFR calc Af Amer: >60 mL/min (ref >60)
Glucose, Bld: 124 mg/dL — ABNORMAL HIGH (ref 65–99)
Potassium: 3.7 mmol/L (ref 3.5–5.1)
SODIUM: 138 mmol/L (ref 135–145)

## 2016-12-25 LAB — CREATININE, SERUM
Creatinine, Ser: 0.76 mg/dL (ref 0.44–1.00)
GFR calc Af Amer: >60 mL/min (ref >60)

## 2016-12-25 LAB — TSH: TSH: 2.131 u[IU]/mL (ref 0.350–4.500)

## 2016-12-25 LAB — MAGNESIUM: Magnesium: 2.1 mg/dL (ref 1.7–2.4)

## 2016-12-25 LAB — CBG MONITORING, ED: Glucose-Capillary: 146 mg/dL — ABNORMAL HIGH (ref 65–99)

## 2016-12-25 LAB — CBC
HCT: 30.8 % — ABNORMAL LOW (ref 36.0–46.0)
HEMATOCRIT: 30.3 % — AB (ref 36.0–46.0)
HEMOGLOBIN: 10.2 g/dL — AB (ref 12.0–15.0)
Hemoglobin: 10 g/dL — ABNORMAL LOW (ref 12.0–15.0)
MCH: 32.9 pg (ref 26.0–34.0)
MCH: 33.9 pg (ref 26.0–34.0)
MCHC: 32.5 g/dL (ref 30.0–36.0)
MCHC: 33.7 g/dL (ref 30.0–36.0)
MCV: 101.3 fL — ABNORMAL HIGH (ref 78.0–100.0)
MCV: 100.7 fL — AB (ref 78.0–100.0)
Platelets: 592 10*3/uL — ABNORMAL HIGH (ref 150–400)
Platelets: 593 10*3/uL — ABNORMAL HIGH (ref 150–400)
RBC: 3.04 MIL/uL — ABNORMAL LOW (ref 3.87–5.11)
RBC: 3.01 MIL/uL — AB (ref 3.87–5.11)
RDW: 16.4 % — AB (ref 11.5–15.5)
RDW: 16.9 % — ABNORMAL HIGH (ref 11.5–15.5)
WBC: 2.9 10*3/uL — AB (ref 4.0–10.5)
WBC: 2.6 10*3/uL — ABNORMAL LOW (ref 4.0–10.5)

## 2016-12-25 LAB — TROPONIN I: Troponin I: <0.03 ng/mL (ref <0.03)

## 2016-12-25 MED ORDER — DICYCLOMINE HCL 10 MG PO CAPS
10.0000 mg | ORAL_CAPSULE | Freq: Three times a day (TID) | ORAL | Status: DC
  Administered 2016-12-26 – 2016-12-27 (×4): 10 mg via ORAL
  Filled 2016-12-25 (×4): qty 1

## 2016-12-25 MED ORDER — VALSARTAN-HYDROCHLOROTHIAZIDE 80-12.5 MG PO TABS: 1.0000 | ORAL_TABLET | Freq: Every day | ORAL | Status: DC

## 2016-12-25 MED ORDER — ANAGRELIDE HCL 0.5 MG PO CAPS
0.5000 mg | ORAL_CAPSULE | Freq: Every day | ORAL | Status: DC
  Administered 2016-12-25 – 2016-12-26 (×2): 0.5 mg via ORAL
  Filled 2016-12-25 (×2): qty 1

## 2016-12-25 MED ORDER — SODIUM CHLORIDE 0.9% FLUSH
3.0000 mL | Freq: Two times a day (BID) | INTRAVENOUS | Status: DC
  Administered 2016-12-25 – 2016-12-27 (×2): 3 mL via INTRAVENOUS

## 2016-12-25 MED ORDER — POLYETHYLENE GLYCOL 3350 17 G PO PACK: 17.0000 g | PACK | Freq: Every day | ORAL | Status: DC | PRN

## 2016-12-25 MED ORDER — ANAGRELIDE HCL 0.5 MG PO CAPS
1.0000 mg | ORAL_CAPSULE | Freq: Every day | ORAL | Status: DC
  Administered 2016-12-26 – 2016-12-27 (×2): 1 mg via ORAL
  Filled 2016-12-25 (×2): qty 2

## 2016-12-25 MED ORDER — SODIUM CHLORIDE 0.9 % IV BOLUS (SEPSIS): 500.0000 mL | Freq: Once | INTRAVENOUS | Status: DC

## 2016-12-25 MED ORDER — ONDANSETRON HCL 4 MG/2ML IJ SOLN: 4.0000 mg | Freq: Four times a day (QID) | INTRAMUSCULAR | Status: DC | PRN

## 2016-12-25 MED ORDER — ONDANSETRON HCL 4 MG PO TABS: 4.0000 mg | ORAL_TABLET | Freq: Four times a day (QID) | ORAL | Status: DC | PRN

## 2016-12-25 MED ORDER — ACETAMINOPHEN 500 MG PO TABS: 1000.0000 mg | ORAL_TABLET | Freq: Four times a day (QID) | ORAL | Status: DC | PRN

## 2016-12-25 MED ORDER — ZOLPIDEM TARTRATE 5 MG PO TABS
5.0000 mg | ORAL_TABLET | Freq: Every evening | ORAL | Status: DC | PRN
  Administered 2016-12-26: 5 mg via ORAL
  Filled 2016-12-25: qty 1

## 2016-12-25 MED ORDER — SODIUM CHLORIDE 0.9 % IV BOLUS (SEPSIS)
1000.0000 mL | Freq: Once | INTRAVENOUS | Status: AC
  Administered 2016-12-25: 1000 mL via INTRAVENOUS

## 2016-12-25 MED ORDER — TRAZODONE HCL 50 MG PO TABS
25.0000 mg | ORAL_TABLET | Freq: Every evening | ORAL | Status: DC | PRN
  Administered 2016-12-25: 25 mg via ORAL
  Filled 2016-12-25: qty 1

## 2016-12-25 MED ORDER — LATANOPROST 0.005 % OP SOLN
1.0000 [drp] | Freq: Every day | OPHTHALMIC | Status: DC
  Administered 2016-12-25 – 2016-12-26 (×2): 1 [drp] via OPHTHALMIC
  Filled 2016-12-25: qty 2.5

## 2016-12-25 MED ORDER — DILTIAZEM HCL ER COATED BEADS 240 MG PO CP24
240.0000 mg | ORAL_CAPSULE | Freq: Every day | ORAL | Status: DC
  Administered 2016-12-26 – 2016-12-27 (×2): 240 mg via ORAL
  Filled 2016-12-25 (×2): qty 1

## 2016-12-25 MED ORDER — ASPIRIN EC 81 MG PO TBEC
81.0000 mg | DELAYED_RELEASE_TABLET | Freq: Every day | ORAL | Status: DC
  Administered 2016-12-26 – 2016-12-27 (×2): 81 mg via ORAL
  Filled 2016-12-25 (×2): qty 1

## 2016-12-25 MED ORDER — ALUM & MAG HYDROXIDE-SIMETH 200-200-20 MG/5ML PO SUSP: 30.0000 mL | Freq: Four times a day (QID) | ORAL | Status: DC | PRN

## 2016-12-25 MED ORDER — HEPARIN SODIUM (PORCINE) 5000 UNIT/ML IJ SOLN
5000.0000 [IU] | Freq: Three times a day (TID) | INTRAMUSCULAR | Status: DC
  Administered 2016-12-25 – 2016-12-27 (×4): 5000 [IU] via SUBCUTANEOUS
  Filled 2016-12-25 (×4): qty 1

## 2016-12-25 MED ORDER — HYDROCODONE-ACETAMINOPHEN 5-325 MG PO TABS: 1.0000 | ORAL_TABLET | ORAL | Status: DC | PRN

## 2016-12-25 MED ORDER — MOMETASONE FURO-FORMOTEROL FUM 200-5 MCG/ACT IN AERO
2.0000 | INHALATION_SPRAY | Freq: Two times a day (BID) | RESPIRATORY_TRACT | Status: DC
  Administered 2016-12-25 – 2016-12-27 (×4): 2 via RESPIRATORY_TRACT
  Filled 2016-12-25: qty 8.8

## 2016-12-25 MED ORDER — ANAGRELIDE HCL 0.5 MG PO CAPS: 0.5000 mg | ORAL_CAPSULE | ORAL | Status: DC

## 2016-12-25 MED ORDER — SODIUM CHLORIDE 0.9 % IV SOLN
INTRAVENOUS | Status: AC
Start: 2016-12-25 — End: 2016-12-26
  Administered 2016-12-25: 21:00:00 via INTRAVENOUS

## 2016-12-25 NOTE — ED Notes (Signed)
[facility] hospitalist at the bedside   The pt still does not have an iv    Pa aware

## 2016-12-25 NOTE — ED Notes (Signed)
Nurse attempted IV and blood draw unable to obtain blood or IV. Patient stated she hard to get blood and an IV. Ordered IV team to obtain IV and blood draw.

## 2016-12-25 NOTE — H&P (Signed)
History and Physical    Lori [name] NFA:[id number] DOB: 1926/01/13 DOA: 12/25/2016  Referring MD/NP/PA: Armstead Peaks PA PCP: [name], MD Outpatient Specialists: [facility] Patient coming from: [place].   Chief Complaint: Syncope and fatique   HPI: Lori [name] is a 83 y.o. female with medical history significant of Atrial fibrillation Asthma Copd, CAD, Diverticulitis, pulmonary nodule and recent diagnosis of MDS/MPN.   Pt reports today she passed out after going to the dining room.  Pt reports she has had recent increasing weakness.  Pt lives at [place] but reports increasing difficulty getting around. Pt reports vomiting x 1 today.   She states she has had some diarrhea on and off.  She reports she has been losing weight. Pt had blood transfusion approximately one month ago. She is being followed by Dr. Burr Medico with Oncology.   ED Course: Pt evaluated in ED, Iv fluids were ordered but pt has not had IV access.  Review of Systems: Review of Systems  Constitutional: Positive for activity change, appetite change and fatigue.  HENT: Negative for trouble swallowing.   Eyes: Negative for visual disturbance.  Respiratory: Negative for chest tightness.   Cardiovascular: Negative for chest pain and leg swelling.  Gastrointestinal: Positive for diarrhea and nausea. Negative for abdominal pain.  Genitourinary: Positive for frequency.  Musculoskeletal: Positive for myalgias.  Skin: Negative.   Allergic/Immunologic: Negative for environmental allergies.  Neurological: Positive for light-headedness. Negative for dizziness.  Psychiatric/Behavioral: Negative.   All other systems reviewed and are negative.   Past Medical History:  Diagnosis Date  . ALLERGIC RHINITIS   . Anxiety   . Asthma   . Atrial fibrillation ([place])   . Back pain of thoracolumbar region    right  . Bronchiectasis   . Colles' fracture of left radius   . COPD (chronic obstructive pulmonary disease) ([place])    . Coronary artery disease   . Diastolic dysfunction   . Diverticulosis   . Dysrhythmia   . History of pneumonia   . Hx of cardiovascular stress test    a. ETT-MV 1/14: Exercised 4:16, no ECG changes, poor ex tol, ant defect c/w soft tissue atten, no ischemia, EF 75%  . Hx of colonic polyp   . Hypertension   . IBS (irritable bowel syndrome)   . Insomnia   . Multiple rib fractures 10/2008   bilateral  . Pleural effusion, left   . Pruritus   . Pulmonary nodule     Past Surgical History:  Procedure Laterality Date  . ANKLE CLOSED REDUCTION Right 12/11/2014   Procedure: CLOSED REDUCTION ANKLE;  Surgeon: Rod Can, MD;  Location: [facility];  Service: Orthopedics;  Laterality: Right;  . APPENDECTOMY    . BLADDER SUSPENSION  2005   A-P  . CHOLECYSTECTOMY    . COLONOSCOPY  04/06/2012   Procedure: COLONOSCOPY;  Surgeon: Jerene Bears, MD;  Location: [facility];  Service: Gastroenterology;  Laterality: N/A;  . EXTERNAL FIXATION LEG Right 12/12/2014   Procedure: ADJUSTMENT OF EXTERNAL FIXATION  RIGHT ANKLE;  Surgeon: Rod Can, MD;  Location: [facility];  Service: Orthopedics;  Laterality: Right;  . EXTERNAL FIXATION LEG Right 12/11/2014   Procedure: POSSIBLE EXTERNAL FIXATION RIGHT ANKLE;  Surgeon: Rod Can, MD;  Location: [facility];  Service: Orthopedics;  Laterality: Right;  . HARDWARE REMOVAL Right 12/25/2014   Procedure: RIGHT ANKLE REMOVE OF DEEP IMPLANT ANE EXTERNAL FIXATOR ;  Surgeon: [name], MD;  Location: [facility];  Service:  Orthopedics;  Laterality: Right;  . ORIF ANKLE FRACTURE Right 12/25/2014   Procedure: OPEN REDUCTION INTERNAL FIXATION (ORIF)TRIMALLEOLAR  ANKLE FRACTURE;  Surgeon: [name], MD;  Location: [facility];  Service: Orthopedics;  Laterality: Right;  . ORIF WRIST FRACTURE Left 2010     Dr. Burney Gauze.  Marland Kitchen POLYPECTOMY    . ROTATOR CUFF REPAIR Right 2006  . TONSILLECTOMY    . TONSILLECTOMY    . TOTAL KNEE ARTHROPLASTY Right  08/2010     reports that she quit smoking about 68 years ago. Her smoking use included Cigarettes. She has a 3.00 pack-year smoking history. She has never used smokeless tobacco. She reports that she does not drink alcohol or use drugs.  Allergies  Allergen Reactions  . Albuterol Sulfate Palpitations  . Levaquin [Levofloxacin In D5w] Other (See Comments)    QT prolongation. Should avoid all flouroquinolones.   . Doxycycline Diarrhea  . Lactose Intolerance (Gi) Other (See Comments)  . Amoxicillin Other (See Comments)    REACTION: unspecified  . Codeine Other (See Comments)    Can take Hydrocodone  . Penicillins Hives, Itching and Rash    Hard lump and rash Has patient had a PCN reaction causing immediate rash, facial/tongue/throat swelling, SOB or lightheadedness with hypotension: Yes Has patient had a PCN reaction causing severe rash involving mucus membranes or skin necrosis: No Has patient had a PCN reaction that required hospitalization unknown Has patient had a PCN reaction occurring within the last 10 years: No If all of the above answers are "NO", then may proceed with Cephalosporin use.  . Sulfa Antibiotics Nausea Only  . Sulfonamide Derivatives Nausea Only    Family History  Problem Relation Age of Onset  . Heart disease Mother   . Rheumatic fever Mother   . Bipolar disorder Daughter   . Pneumonia Father   . Colon cancer Neg Hx    Unacceptable: Noncontributory, unremarkable, or negative. Acceptable: Family history reviewed and not pertinent (If you reviewed it)  Prior to Admission medications   Medication Sig Start Date End Date Taking? Authorizing Provider  acetaminophen (TYLENOL) 500 MG tablet Take 1,000 mg by mouth every 6 (six) hours as needed for headache (pain).   Yes Historical Provider, MD  anagrelide (AGRYLIN) 0.5 MG capsule Take 2 tab in morning and 1 tab in evening Patient taking differently: Take 0.5-1 mg by mouth See admin instructions. Take 2 tablets (1  mg) by mouth every morning and 1 tablet (0.5 mg) at night 12/05/16  Yes Truitt Merle, MD  aspirin EC 81 MG tablet Take 1 tablet (81 mg total) by mouth daily. 06/02/15  Yes Burtis Junes, NP  bimatoprost (LUMIGAN) 0.01 % SOLN Place 1 drop into both eyes daily. Patient taking differently: Place 1 drop into both eyes at bedtime.  08/06/14  Yes Janith Lima, MD  budesonide-formoterol [facility]) 160-4.5 MCG/ACT inhaler Inhale 2 puffs into the lungs as needed (allergies). Patient taking differently: Inhale 2 puffs into the lungs 2 (two) times daily as needed (allergies).  12/23/16  Yes Truitt Merle, MD  clindamycin (CLEOCIN) 300 MG capsule Take 600 mg by mouth See admin instructions. Take 2 capsules (600 mg) by mouth one hour prior to dental procedures   Yes Historical Provider, MD  dicyclomine (BENTYL) 10 MG capsule Take 1 capsule (10 mg total) by mouth 3 (three) times daily as needed. IBS Patient taking differently: Take 10 mg by mouth 3 (three) times daily before meals. IBS 11/25/14  Yes Marcello Moores  Evalina Field, MD  diltiazem (CARDIZEM CD) 240 MG 24 hr capsule Take 1 capsule (240 mg total) by mouth daily. 10/04/16 01/02/17 Yes Burtis Junes, NP  loperamide (IMODIUM) 2 MG capsule Take 2 mg by mouth as needed for diarrhea or loose stools.    Yes Historical Provider, MD  magnesium oxide (MAG-OX) 400 MG tablet Take 400 mg by mouth daily as needed (leg cramps).   Yes Historical Provider, MD  Multiple Vitamin (MULTIVITAMIN WITH MINERALS) TABS tablet Take 1 tablet by mouth daily.   Yes Historical Provider, MD  nitrofurantoin, macrocrystal-monohydrate, (MACROBID) 100 MG capsule Take 1 capsule (100 mg total) by mouth 2 (two) times daily. Patient taking differently: Take 100 mg by mouth 2 (two) times daily. 5 day course started 12/23/16 12/23/16  Yes Truitt Merle, MD  valsartan-hydrochlorothiazide (DIOVAN-HCT) 80-12.5 MG tablet Take 1 tablet by mouth daily. 10/04/16  Yes Burtis Junes, NP  zolpidem (AMBIEN) 5 MG tablet Take 1 tablet (5  mg total) by mouth at bedtime as needed for sleep. 06/10/15  Yes Larey Dresser, MD    Physical Exam: Vitals:   12/25/16 1333 12/25/16 1515 12/25/16 1615 12/25/16 1630  BP: 98/63 (!) 124/55 (!) 98/48 (!) 118/48  Pulse:  96 85 92  Resp: 16 (!) 22 20 (!) 30  Temp: 97.7 F (36.5 C)     TempSrc: Oral     SpO2: 99% 98% 95% 95%  Weight:          Constitutional: NAD, calm, comfortable Vitals:   12/25/16 1333 12/25/16 1515 12/25/16 1615 12/25/16 1630  BP: 98/63 (!) 124/55 (!) 98/48 (!) 118/48  Pulse:  96 85 92  Resp: 16 (!) 22 20 (!) 30  Temp: 97.7 F (36.5 C)     TempSrc: Oral     SpO2: 99% 98% 95% 95%  Weight:       Eyes: PERRL, lids and conjunctivae normal ENMT: Mucous membranes are moist. Posterior pharynx clear of any exudate or lesions.Normal dentition.  Neck: normal, supple, no masses, no thyromegaly Respiratory: clear to auscultation bilaterally, no wheezing, no crackles. Normal respiratory effort. No accessory muscle use.  Cardiovascular: Regular rate and rhythm, no murmurs / rubs / gallops. No extremity edema. 2+ pedal pulses. No carotid bruits.  Abdomen: no tenderness, no masses palpated. No hepatosplenomegaly. Bowel sounds positive.  Musculoskeletal: no clubbing / cyanosis. No joint deformity upper and lower extremities. Good ROM, no contractures. Normal muscle tone.  Skin: no rashes, lesions, ulcers. No induration Neurologic: CN 2-12 grossly intact. Sensation intact, DTR normal. Strength 5/5 in all 4.  Ambulation deferred due to weakness Psychiatric: Normal judgment and insight. Alert and oriented x 3.    Labs on Admission: I have personally reviewed following labs and imaging studies  CBC:  Recent Labs Lab 12/19/16 1223 12/25/16 1550  WBC 2.8* 2.9*  NEUTROABS 1.5  --   HGB 9.4* 10.0*  HCT 28.2* 30.8*  MCV 101.4* 101.3*  PLT 590* 347*   Basic Metabolic Panel:  Recent Labs Lab 12/25/16 1550  NA 138  K 3.7  CL 106  CO2 23  GLUCOSE 124*  BUN 23*    CREATININE 0.77  CALCIUM 9.1   GFR: Estimated Creatinine Clearance: 43.7 mL/min (by C-G formula based on SCr of 0.77 mg/dL). Liver Function Tests: No results for input(s): AST, ALT, ALKPHOS, BILITOT, PROT, ALBUMIN in the last 168 hours. No results for input(s): LIPASE, AMYLASE in the last 168 hours. No results for input(s): AMMONIA in the last  168 hours. Coagulation Profile: No results for input(s): INR, PROTIME in the last 168 hours. Cardiac Enzymes: No results for input(s): CKTOTAL, CKMB, CKMBINDEX, TROPONINI in the last 168 hours. BNP (last 3 results)  Recent Labs  09/28/16 0953  PROBNP 502   HbA1C: No results for input(s): HGBA1C in the last 72 hours. CBG:  Recent Labs Lab 12/25/16 1343  GLUCAP 146*   Lipid Profile: No results for input(s): CHOL, HDL, LDLCALC, TRIG, CHOLHDL, LDLDIRECT in the last 72 hours. Thyroid Function Tests: No results for input(s): TSH, T4TOTAL, FREET4, T3FREE, THYROIDAB in the last 72 hours. Anemia Panel: No results for input(s): VITAMINB12, FOLATE, FERRITIN, TIBC, IRON, RETICCTPCT in the last 72 hours. Urine analysis:    Component Value Date/Time   COLORURINE YELLOW 03/06/2015 1135   APPEARANCEUR CLEAR 03/06/2015 1135   LABSPEC 1.015 03/06/2015 1135   PHURINE 5.5 03/06/2015 1135   GLUCOSEU NEGATIVE 03/06/2015 1135   GLUCOSEU NEGATIVE 12/05/2014 1220   HGBUR NEGATIVE 03/06/2015 1135   BILIRUBINUR NEGATIVE 03/06/2015 1135   KETONESUR NEGATIVE 03/06/2015 1135   PROTEINUR NEGATIVE 03/06/2015 1135   UROBILINOGEN 0.2 03/06/2015 1135   NITRITE NEGATIVE 03/06/2015 1135   LEUKOCYTESUR NEGATIVE 03/06/2015 1135   Sepsis Labs: @LABRCNTIP (procalcitonin:4,lacticidven:4) )  Radiological Exams on Admission: Dg Chest 2 View  Result Date: 12/25/2016 CLINICAL DATA:  Witnessed loss of consciousness. EXAM: CHEST  2 VIEW COMPARISON:  May 29, 2015 FINDINGS: The heart size and mediastinal contours are within normal limits. Both lungs are clear.  The visualized skeletal structures are unremarkable. IMPRESSION: No active cardiopulmonary disease. Electronically Signed   By: Dorise Bullion III M.D   On: 12/25/2016 14:43    EKG: Independently reviewed.   Assessment/Plan  1. Syncope  Pt admitted to observation and Iv fluids.   Syncope and weakness may be due to dehydration.    Care managemnt consult, Pt may need higher level of care at [facility] consult   2. MDM/MPN Continue current medications  Hemoglobin is at baseline, no indication for transfusion   3. Atrial fibrillation Continue current medications   4. Chf.  Continue current medications.  5. Hypertension Continue home medications   DVT prophylaxis:  Heparin Code Status: no code Family Communication: Pt reports she lives with husband.  He has gone to nap.  Disposition Plan: Admit to observation. Probable discharge in am Consults called: PT/OT.  Care manger to evaluate for possible movement to higher level of care Admission status: observation   Alyse Low [facility] Pager [phone]  If 7PM-7AM, please contact night-coverage [email or web address] Password [facility]  12/25/2016, 5:23 PM

## 2016-12-25 NOTE — ED Notes (Signed)
The pt is watching tv   No iv present

## 2016-12-25 NOTE — ED Notes (Signed)
Blood draw being attempted
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
## 2016-12-25 NOTE — ED Notes (Signed)
Iv team at the bedside  Attempting an iv

## 2016-12-25 NOTE — ED Triage Notes (Signed)
Arrived via EMS from [facility]. Witnessed LOC while sitting down staff assisted patient to ground. LOC less then 1 minute.  Patient alert answering and following commands appropriate denies chest pain or shortness of breath. EMS administered IV Fluids 0.9 NS 300 ML and Zofran 4mg  IV emesis x 3 prior to arrival. Patient ate lunch prior to syncope event.

## 2016-12-26 ENCOUNTER — Other Ambulatory Visit: Payer: Self-pay

## 2016-12-26 ENCOUNTER — Observation Stay (HOSPITAL_BASED_OUTPATIENT_CLINIC_OR_DEPARTMENT_OTHER): Payer: Medicare Other

## 2016-12-26 DIAGNOSIS — E876 Hypokalemia: Secondary | ICD-10-CM | POA: Diagnosis present

## 2016-12-26 DIAGNOSIS — Z96651 Presence of right artificial knee joint: Secondary | ICD-10-CM | POA: Diagnosis present

## 2016-12-26 DIAGNOSIS — I110 Hypertensive heart disease with heart failure: Secondary | ICD-10-CM | POA: Diagnosis present

## 2016-12-26 DIAGNOSIS — N390 Urinary tract infection, site not specified: Secondary | ICD-10-CM

## 2016-12-26 DIAGNOSIS — K589 Irritable bowel syndrome without diarrhea: Secondary | ICD-10-CM | POA: Diagnosis present

## 2016-12-26 DIAGNOSIS — E860 Dehydration: Secondary | ICD-10-CM | POA: Diagnosis not present

## 2016-12-26 DIAGNOSIS — Z66 Do not resuscitate: Secondary | ICD-10-CM | POA: Diagnosis present

## 2016-12-26 DIAGNOSIS — I4581 Long QT syndrome: Secondary | ICD-10-CM | POA: Diagnosis not present

## 2016-12-26 DIAGNOSIS — R000 Tachycardia, unspecified: Secondary | ICD-10-CM

## 2016-12-26 DIAGNOSIS — R55 Syncope and collapse: Secondary | ICD-10-CM | POA: Diagnosis not present

## 2016-12-26 DIAGNOSIS — I4891 Unspecified atrial fibrillation: Secondary | ICD-10-CM

## 2016-12-26 DIAGNOSIS — I2510 Atherosclerotic heart disease of native coronary artery without angina pectoris: Secondary | ICD-10-CM | POA: Diagnosis present

## 2016-12-26 DIAGNOSIS — Z87891 Personal history of nicotine dependence: Secondary | ICD-10-CM | POA: Diagnosis not present

## 2016-12-26 DIAGNOSIS — I471 Supraventricular tachycardia: Secondary | ICD-10-CM | POA: Diagnosis not present

## 2016-12-26 DIAGNOSIS — D469 Myelodysplastic syndrome, unspecified: Secondary | ICD-10-CM | POA: Diagnosis present

## 2016-12-26 DIAGNOSIS — E739 Lactose intolerance, unspecified: Secondary | ICD-10-CM | POA: Diagnosis present

## 2016-12-26 DIAGNOSIS — R911 Solitary pulmonary nodule: Secondary | ICD-10-CM | POA: Diagnosis present

## 2016-12-26 DIAGNOSIS — Z86010 Personal history of colonic polyps: Secondary | ICD-10-CM | POA: Diagnosis not present

## 2016-12-26 DIAGNOSIS — I10 Essential (primary) hypertension: Secondary | ICD-10-CM | POA: Diagnosis not present

## 2016-12-26 DIAGNOSIS — F419 Anxiety disorder, unspecified: Secondary | ICD-10-CM | POA: Diagnosis present

## 2016-12-26 DIAGNOSIS — Z885 Allergy status to narcotic agent status: Secondary | ICD-10-CM | POA: Diagnosis not present

## 2016-12-26 DIAGNOSIS — Z881 Allergy status to other antibiotic agents status: Secondary | ICD-10-CM | POA: Diagnosis not present

## 2016-12-26 DIAGNOSIS — Z8701 Personal history of pneumonia (recurrent): Secondary | ICD-10-CM | POA: Diagnosis not present

## 2016-12-26 DIAGNOSIS — I5032 Chronic diastolic (congestive) heart failure: Secondary | ICD-10-CM | POA: Diagnosis not present

## 2016-12-26 DIAGNOSIS — Z8249 Family history of ischemic heart disease and other diseases of the circulatory system: Secondary | ICD-10-CM | POA: Diagnosis not present

## 2016-12-26 DIAGNOSIS — J449 Chronic obstructive pulmonary disease, unspecified: Secondary | ICD-10-CM | POA: Diagnosis present

## 2016-12-26 LAB — URINALYSIS, ROUTINE W REFLEX MICROSCOPIC
Bilirubin Urine: NEGATIVE
Glucose, UA: NEGATIVE mg/dL
HGB URINE DIPSTICK: NEGATIVE
Ketones, ur: NEGATIVE mg/dL
NITRITE: POSITIVE — AB
Protein, ur: NEGATIVE mg/dL
SPECIFIC GRAVITY, URINE: 1.011 (ref 1.005–1.030)
Squamous Epithelial / LPF: NONE SEEN
pH: 5 (ref 5.0–8.0)

## 2016-12-26 LAB — GLUCOSE, CAPILLARY: Glucose-Capillary: 73 mg/dL (ref 65–99)

## 2016-12-26 LAB — ECHOCARDIOGRAM COMPLETE
Height: 65 in
WEIGHTICAEL: 2083.2 [oz_av]

## 2016-12-26 LAB — BASIC METABOLIC PANEL WITH GFR
Anion gap: 10 (ref 5–15)
BUN: 17 mg/dL (ref 6–20)
CO2: 22 mmol/L (ref 22–32)
Calcium: 8.6 mg/dL — ABNORMAL LOW (ref 8.9–10.3)
Chloride: 102 mmol/L (ref 101–111)
Creatinine, Ser: 0.71 mg/dL (ref 0.44–1.00)
GFR calc Af Amer: >60 mL/min
GFR calc non Af Amer: >60 mL/min
Glucose, Bld: 149 mg/dL — ABNORMAL HIGH (ref 65–99)
Potassium: 3.3 mmol/L — ABNORMAL LOW (ref 3.5–5.1)
Sodium: 134 mmol/L — ABNORMAL LOW (ref 135–145)

## 2016-12-26 LAB — TROPONIN I: Troponin I: <0.03 ng/mL (ref <0.03)

## 2016-12-26 MED ORDER — CEFTRIAXONE SODIUM 1 G IJ SOLR
1.0000 g | INTRAMUSCULAR | Status: DC
  Administered 2016-12-26: 1 g via INTRAVENOUS
  Filled 2016-12-26 (×2): qty 10

## 2016-12-26 MED ORDER — POTASSIUM CHLORIDE CRYS ER 20 MEQ PO TBCR
40.0000 meq | EXTENDED_RELEASE_TABLET | Freq: Once | ORAL | Status: AC
  Administered 2016-12-26: 40 meq via ORAL
  Filled 2016-12-26: qty 2

## 2016-12-26 MED ORDER — DILTIAZEM LOAD VIA INFUSION: 10.0000 mg | Freq: Once | INTRAVENOUS | Status: DC

## 2016-12-26 MED ORDER — AMIODARONE HCL IN DEXTROSE 360-4.14 MG/200ML-% IV SOLN
60.0000 mg/h | INTRAVENOUS | Status: AC
  Administered 2016-12-26 (×2): 60 mg/h via INTRAVENOUS

## 2016-12-26 MED ORDER — CLONAZEPAM 0.5 MG PO TABS: 0.2500 mg | ORAL_TABLET | Freq: Three times a day (TID) | ORAL | Status: DC | PRN

## 2016-12-26 MED ORDER — AMIODARONE LOAD VIA INFUSION
150.0000 mg | Freq: Once | INTRAVENOUS | Status: AC
  Administered 2016-12-26: 150 mg via INTRAVENOUS
  Filled 2016-12-26: qty 83.34

## 2016-12-26 MED ORDER — AMIODARONE HCL IN DEXTROSE 360-4.14 MG/200ML-% IV SOLN
30.0000 mg/h | INTRAVENOUS | Status: DC
  Administered 2016-12-26: 30 mg/h via INTRAVENOUS
  Filled 2016-12-26 (×3): qty 200

## 2016-12-26 MED ORDER — SODIUM CHLORIDE 0.9 % IV SOLN
INTRAVENOUS | Status: AC
  Administered 2016-12-26: 09:00:00 via INTRAVENOUS

## 2016-12-26 MED ORDER — HYDROCHLOROTHIAZIDE 12.5 MG PO CAPS
12.5000 mg | ORAL_CAPSULE | Freq: Every day | ORAL | Status: DC
  Administered 2016-12-26 – 2016-12-27 (×2): 12.5 mg via ORAL
  Filled 2016-12-26 (×2): qty 1

## 2016-12-26 MED ORDER — DILTIAZEM HCL 25 MG/5ML IV SOLN
10.0000 mg | Freq: Once | INTRAVENOUS | Status: AC
  Administered 2016-12-26: 10 mg via INTRAVENOUS
  Filled 2016-12-26: qty 5

## 2016-12-26 MED ORDER — IRBESARTAN 75 MG PO TABS
75.0000 mg | ORAL_TABLET | Freq: Every day | ORAL | Status: DC
  Administered 2016-12-26 – 2016-12-27 (×2): 75 mg via ORAL
  Filled 2016-12-26 (×2): qty 1

## 2016-12-26 NOTE — Progress Notes (Signed)
Pt had episode of SVT HR went into 140-150's no s/s, HR went back to 90's, hx of Afib  MD notified, will continue to monitor, Thanks Arvella Nigh RN

## 2016-12-26 NOTE — Progress Notes (Addendum)
PROGRESS NOTE    Lori [name]  JME:[id number] DOB: 07-11-1926 DOA: 12/25/2016 PCP: [name], MD   Outpatient Specialists:     Brief Narrative:  Lori [name] is a 83 y.o. female with medical history significant of Atrial fibrillation Asthma Copd, CAD, Diverticulitis, pulmonary nodule and recent diagnosis of MDS/MPN.   Pt reports today she passed out after going to the dining room.  Pt reports she has had recent increasing weakness.  Pt lives at [place] but reports increasing difficulty getting around. Pt reports vomiting x 1 today.   She states she has had some diarrhea on and off.  She reports she has been losing weight. Pt had blood transfusion approximately one month ago. She is being followed by Dr. Burr Medico with Oncology.    Assessment & Plan:   Active Problems:   Syncope   Syncope -? from dehydration -responded to IVF -orthos after IVF negative -seen by PT- home health  UTI -rocephin -culture  MAT vs a fib -echo: Left ventricle: The cavity size was normal. There was mild   concentric hypertrophy. Systolic function was normal. The   estimated ejection fraction was in the range of 60% to 65%. Wall   motion was normal; there were no regional wall motion   abnormalities. The study was not technically sufficient to allow   evaluation of LV diastolic dysfunction due to atrial   fibrillation. -IV amioadrone for now since IV cardizem did not work -not a candidate for anticoagulation -cardiology consult  MDM/MPN -follows with Dr. Burr Medico  Hypokalemia -replete   DVT prophylaxis:  SCD's  Code Status: DNR   Family Communication:   Disposition Plan:     Consultants:   cards   Subjective: Asking to go home-- says she does not have underwear or make up on and needs to leave  Objective: Vitals:   12/26/16 0811 12/26/16 1040 12/26/16 1126 12/26/16 1202  BP: (!) 117/50 (!) 127/47 (!) 147/98 (!) 146/61  Pulse: 80   (!) 150  Resp: 20  20   Temp:  98.4 F (36.9 C)  97.9 F (36.6 C)   TempSrc: Oral  Oral   SpO2: 96%  96%   Weight:      Height:        Intake/Output Summary (Last 24 hours) at 12/26/16 1503 Last data filed at 12/26/16 0600  Gross per 24 hour  Intake              920 ml  Output               50 ml  Net              870 ml   Filed Weights   12/25/16 1319 12/25/16 2018 12/26/16 0508  Weight: 61.2 kg (135 lb) 59.5 kg (131 lb 3.2 oz) 59.1 kg (130 lb 3.2 oz)    Examination:  General exam: Appears calm and comfortable-sitting on side of bed Respiratory system: Clear to auscultation. Respiratory effort normal. Cardiovascular system: S1 & S2 heard, RRR. No JVD, murmurs, rubs, gallops or clicks. No pedal edema. Gastrointestinal system: Abdomen is nondistended, soft and nontender. No organomegaly or masses felt. Normal bowel sounds heard. Central nervous system: Alert Extremities: Symmetric 5 x 5 power. Skin: No rashes, lesions or ulcers Psychiatry: Judgement impaired    Data Reviewed: I have personally reviewed following labs and imaging studies  CBC:  Recent Labs Lab 12/25/16 1550 12/25/16 2019  WBC 2.9* 2.6*  HGB 10.0* 10.2*  HCT 30.8* 30.3*  MCV 101.3* 100.7*  PLT 592* 623*   Basic Metabolic Panel:  Recent Labs Lab 12/25/16 1550 12/26/16 1306  NA 138 134*  K 3.7 3.3*  CL 106 102  CO2 23 22  GLUCOSE 124* 149*  BUN 23* 17  CREATININE 0.76  0.77 0.71  CALCIUM 9.1 8.6*  MG 2.1  --    GFR: Estimated Creatinine Clearance: 43.7 mL/min (by C-G formula based on SCr of 0.71 mg/dL). Liver Function Tests: No results for input(s): AST, ALT, ALKPHOS, BILITOT, PROT, ALBUMIN in the last 168 hours. No results for input(s): LIPASE, AMYLASE in the last 168 hours. No results for input(s): AMMONIA in the last 168 hours. Coagulation Profile: No results for input(s): INR, PROTIME in the last 168 hours. Cardiac Enzymes:  Recent Labs Lab 12/25/16 1900 12/26/16 0605  TROPONINI <0.03 <0.03   BNP  (last 3 results)  Recent Labs  09/28/16 0953  PROBNP 502   HbA1C: No results for input(s): HGBA1C in the last 72 hours. CBG:  Recent Labs Lab 12/25/16 1343 12/26/16 0620  GLUCAP 146* 73   Lipid Profile: No results for input(s): CHOL, HDL, LDLCALC, TRIG, CHOLHDL, LDLDIRECT in the last 72 hours. Thyroid Function Tests:  Recent Labs  12/25/16 1550  TSH 2.131   Anemia Panel: No results for input(s): VITAMINB12, FOLATE, FERRITIN, TIBC, IRON, RETICCTPCT in the last 72 hours. Urine analysis:    Component Value Date/Time   COLORURINE YELLOW 12/26/2016 0023   APPEARANCEUR HAZY (A) 12/26/2016 0023   LABSPEC 1.011 12/26/2016 0023   PHURINE 5.0 12/26/2016 0023   GLUCOSEU NEGATIVE 12/26/2016 0023   GLUCOSEU NEGATIVE 12/05/2014 1220   HGBUR NEGATIVE 12/26/2016 0023   BILIRUBINUR NEGATIVE 12/26/2016 0023   KETONESUR NEGATIVE 12/26/2016 0023   PROTEINUR NEGATIVE 12/26/2016 0023   UROBILINOGEN 0.2 03/06/2015 1135   NITRITE POSITIVE (A) 12/26/2016 0023   LEUKOCYTESUR TRACE (A) 12/26/2016 0023     )No results found for this or any previous visit (from the past 240 hour(s)).    Anti-infectives    None       Radiology Studies: Dg Chest 2 View  Result Date: 12/25/2016 CLINICAL DATA:  Witnessed loss of consciousness. EXAM: CHEST  2 VIEW COMPARISON:  May 29, 2015 FINDINGS: The heart size and mediastinal contours are within normal limits. Both lungs are clear. The visualized skeletal structures are unremarkable. IMPRESSION: No active cardiopulmonary disease. Electronically Signed   By: Dorise Bullion III M.D   On: 12/25/2016 14:43        Scheduled Meds: . anagrelide  0.5 mg Oral QHS  . anagrelide  1 mg Oral Daily  . aspirin EC  81 mg Oral Daily  . dicyclomine  10 mg Oral TID AC  . diltiazem  240 mg Oral Daily  . heparin  5,000 Units Subcutaneous Q8H  . irbesartan  75 mg Oral Daily   And  . hydrochlorothiazide  12.5 mg Oral Daily  . latanoprost  1 drop Both Eyes  QHS  . mometasone-formoterol  2 puff Inhalation BID  . potassium chloride  40 mEq Oral Once  . sodium chloride flush  3 mL Intravenous Q12H   Continuous Infusions: . sodium chloride 75 mL/hr at 12/26/16 0845  . amiodarone 60 mg/hr (12/26/16 1441)   Followed by  . amiodarone       LOS: 0 days    Time spent: 25 min    [place], DO [facility] Pager [phone]  If 7PM-7AM, please contact  night-coverage [email or web address] Password [email or web address] 12/26/2016, 3:03 PM

## 2016-12-26 NOTE — Progress Notes (Signed)
Patient is from [facility]; awaiting on PT evals for disposition needs; Aneta Mins [phone]

## 2016-12-26 NOTE — Evaluation (Signed)
Physical Therapy Evaluation Patient Details Name: Lori [name] MRN: [id number] DOB: 1926-04-15 Today's Date: 12/26/2016   History of Present Illness  Zephaniah Enyeart Valliant is a 83 y.o. female with recent diagnosis of MDS and MDN who presents after episode of syncope. Patient was sitting in a chair at lunch at [facility] and passed out completely. She had several episodes of vomiting afterwards. Other PMH: COPD, a-fib, IBS, asthma, back pain, HTN, L colles fx, anxiety.  Clinical Impression  Pt admitted with above diagnosis. Pt currently with functional limitations due to the deficits listed below (see PT Problem List). Pt was mildly dizzy with transition from supine to sit and sit to stand, cleared with time up, see vitals. Ambulated 200' with RW and min-guard A. Was not regularly using AD for ambulation until recently.  Pt will benefit from skilled PT to increase their independence and safety with mobility to allow discharge to the venue listed below.   Orthostatics: supine 122/73                       Sit  125/51                       Standing 130/60    Follow Up Recommendations Home health PT    Equipment Recommendations  None recommended by PT    Recommendations for Other Services       Precautions / Restrictions Precautions Precautions: Fall Restrictions Weight Bearing Restrictions: No      Mobility  Bed Mobility Overal bed mobility: Modified Independent                Transfers Overall transfer level: Needs assistance Equipment used: Rolling walker (2 wheeled) Transfers: Sit to/from Stand Sit to Stand: Min guard         General transfer comment: min-guard for safety as pt was "whoozy" with initial standing  Ambulation/Gait Ambulation/Gait assistance: Min guard Ambulation Distance (Feet): 200 Feet Assistive device: Rolling walker (2 wheeled) Gait Pattern/deviations: Step-through pattern Gait velocity: WFL Gait velocity interpretation: at or above normal speed  for age/gender General Gait Details: pt ambulated with RW safely, though she reports that she has not been using RW until recently and would prefer to get back to no AD. No LOB. Feeling of dizziness did not worsen  Stairs            Wheelchair Mobility    Modified Rankin (Stroke Patients Only)       Balance Overall balance assessment: Needs assistance Sitting-balance support: No upper extremity supported Sitting balance-Leahy Scale: Good     Standing balance support: No upper extremity supported Standing balance-Leahy Scale: Fair Standing balance comment: unsteady with initial standing                             Pertinent Vitals/Pain Pain Assessment: No/denies pain    Home Living Family/patient expects to be discharged to:: Private residence Living Arrangements: Spouse/significant other Available Help at Discharge: Family;Available 24 hours/day Type of Home: Apartment Home Access: Level entry     Home Layout: One level Home Equipment: Walker - 2 wheels;Cane - single point      Prior Function Level of Independence: Independent with assistive device(s)         Comments: has been using RW recently     Hand Dominance   Dominant Hand: Right    Extremity/Trunk Assessment   Upper Extremity  Assessment Upper Extremity Assessment: Overall WFL for tasks assessed    Lower Extremity Assessment Lower Extremity Assessment: Overall WFL for tasks assessed    Cervical / Trunk Assessment Cervical / Trunk Assessment: Normal  Communication   Communication: HOH  Cognition Arousal/Alertness: Awake/alert Behavior During Therapy: WFL for tasks assessed/performed Overall Cognitive Status: Within Functional Limits for tasks assessed                                 General Comments: pt reports that she was very active until 11/17, golfing, swimming, etc but has not been able to exercise since November      General Comments General comments  (skin integrity, edema, etc.): Discussed strategies for management of OH. Pt has just been diagnosed with MDS in past month    Exercises     Assessment/Plan    PT Assessment Patient needs continued PT services  PT Problem List Decreased knowledge of use of DME;Decreased knowledge of precautions;Decreased balance       PT Treatment Interventions DME instruction;Gait training;Functional mobility training;Therapeutic activities;Therapeutic exercise;Balance training;Patient/family education    PT Goals (Current goals can be found in the Care Plan section)  Acute Rehab PT Goals Patient Stated Goal: return home, be active PT Goal Formulation: With patient Time For Goal Achievement: 01/09/17 Potential to Achieve Goals: Good    Frequency Min 3X/week   Barriers to discharge        Co-evaluation               End of Session Equipment Utilized During Treatment: Gait belt Activity Tolerance: Patient tolerated treatment well Patient left: in chair;with call bell/phone within reach;with family/visitor present Nurse Communication: Mobility status PT Visit Diagnosis: Dizziness and giddiness (R42)    Time: 1610-9604 PT Time Calculation (min) (ACUTE ONLY): 37 min   Charges:   PT Evaluation $PT Eval Moderate Complexity: 1 Procedure PT Treatments $Gait Training: 8-22 mins   PT G Codes:   PT G-Codes **NOT FOR INPATIENT CLASS** Functional Assessment Tool Used: AM-PAC 6 Clicks Basic Mobility Functional Limitation: Mobility: Walking and moving around Mobility: Walking and Moving Around Current Status (V4098): At least 40 percent but less than 60 percent impaired, limited or restricted Mobility: Walking and Moving Around Goal Status [phone]): At least 1 percent but less than 20 percent impaired, limited or restricted    [place], PT  Acute Rehab Services  [place] 12/26/2016, 2:01 PM

## 2016-12-26 NOTE — Consult Note (Signed)
Cardiology Consult    Patient ID: Lori [name] MRN: [id number], DOB/AGE: [birth year]   Admit date: 12/25/2016 Date of Consult: 12/26/2016  Primary Physician: [name], MD Reason for Consult: syncope and atrial fibrillation Primary Cardiologist: Dr. Aundra Dubin Requesting Provider: Dr Eliseo Squires  History of Present Illness    Lori [name] is a 83 y.o. female who is being seen today for the evaluation of syncope and atrial fibrillation at the request of Dr. Eliseo Squires. She has a past medical history significant for Asthma COPD, CAD, Diverticulitis, pulmonary nodule and recent diagnosis of MDS/MPN aranesp. She presented to the [facility] ED for evaluation of syncope.   She resides at [facility]. She has had increasing weakness and passed out after going to the dining room.   She was last seen in our office by Lori Merle, NP on 10/19/16 at which time she was having chronic DOE. It was noted that atrial fib has never been definitively diagnosed, but is felt to be multifocal atrial tachycardia by EKG. She has been on amiodarone in the past which was stopped due to skin discoloration and the patient does not want to restart. She was taking her diltiazem after previously having stopped her meds. Her ARB had been reduced at a previous visit for BP. She is not a candidate for anticoagulation.   SCr is normal.  K+ 3.7,  Mag 2.1 Last echo 04/27/16 showed normal EF 60-65%, grade 2 DD, Mild MAC with trivial MR, Mild MR and TR. Repeat echo pending.  She had a low risk myoview in 04/2016.   Past Medical History       Past Medical History:  Diagnosis Date  . ALLERGIC RHINITIS   . Anxiety   . Asthma   . Atrial fibrillation ([place])   . Back pain of thoracolumbar region    right  . Bronchiectasis   . Colles' fracture of left radius   . COPD (chronic obstructive pulmonary disease) ([place])   . Coronary artery disease   . Diastolic dysfunction   . Diverticulosis   . Dysrhythmia   .  History of pneumonia   . Hx of cardiovascular stress test    a. ETT-MV 1/14: Exercised 4:16, no ECG changes, poor ex tol, ant defect c/w soft tissue atten, no ischemia, EF 75%  . Hx of colonic polyp   . Hypertension   . IBS (irritable bowel syndrome)   . Insomnia   . Multiple rib fractures 10/2008   bilateral  . Pleural effusion, left   . Pruritus   . Pulmonary nodule          Past Surgical History:  Procedure Laterality Date  . ANKLE CLOSED REDUCTION Right 12/11/2014   Procedure: CLOSED REDUCTION ANKLE;  Surgeon: Rod Can, MD;  Location: [facility];  Service: Orthopedics;  Laterality: Right;  . APPENDECTOMY    . BLADDER SUSPENSION  2005   A-P  . CHOLECYSTECTOMY    . COLONOSCOPY  04/06/2012   Procedure: COLONOSCOPY;  Surgeon: Jerene Bears, MD;  Location: [facility];  Service: Gastroenterology;  Laterality: N/A;  . EXTERNAL FIXATION LEG Right 12/12/2014   Procedure: ADJUSTMENT OF EXTERNAL FIXATION  RIGHT ANKLE;  Surgeon: Rod Can, MD;  Location: [facility];  Service: Orthopedics;  Laterality: Right;  . EXTERNAL FIXATION LEG Right 12/11/2014   Procedure: POSSIBLE EXTERNAL FIXATION RIGHT ANKLE;  Surgeon: Rod Can, MD;  Location: [facility];  Service: Orthopedics;  Laterality: Right;  . HARDWARE REMOVAL Right 12/25/2014   Procedure: RIGHT ANKLE  REMOVE OF DEEP IMPLANT ANE EXTERNAL FIXATOR ;  Surgeon: [name], MD;  Location: [facility];  Service: Orthopedics;  Laterality: Right;  . ORIF ANKLE FRACTURE Right 12/25/2014   Procedure: OPEN REDUCTION INTERNAL FIXATION (ORIF)TRIMALLEOLAR  ANKLE FRACTURE;  Surgeon: [name], MD;  Location: [facility];  Service: Orthopedics;  Laterality: Right;  . ORIF WRIST FRACTURE Left 2010     Dr. Burney Gauze.  Marland Kitchen POLYPECTOMY    . ROTATOR CUFF REPAIR Right 2006  . TONSILLECTOMY    . TONSILLECTOMY    . TOTAL KNEE ARTHROPLASTY Right 08/2010     Allergies       Allergies  Allergen Reactions   . Albuterol Sulfate Palpitations  . Levaquin [Levofloxacin In D5w] Other (See Comments)    QT prolongation. Should avoid all flouroquinolones.   . Doxycycline Diarrhea  . Lactose Intolerance (Gi) Other (See Comments)  . Amoxicillin Other (See Comments)    REACTION: unspecified  . Codeine Other (See Comments)    Can take Hydrocodone  . Penicillins Hives, Itching and Rash    Hard lump and rash Has patient had a PCN reaction causing immediate rash, facial/tongue/throat swelling, SOB or lightheadedness with hypotension: Yes Has patient had a PCN reaction causing severe rash involving mucus membranes or skin necrosis: No Has patient had a PCN reaction that required hospitalization unknown Has patient had a PCN reaction occurring within the last 10 years: No If all of the above answers are "NO", then may proceed with Cephalosporin use.  . Sulfa Antibiotics Nausea Only  . Sulfonamide Derivatives Nausea Only    Inpatient Medications    . amiodarone  150 mg Intravenous Once  . anagrelide  0.5 mg Oral QHS  . anagrelide  1 mg Oral Daily  . aspirin EC  81 mg Oral Daily  . dicyclomine  10 mg Oral TID AC  . diltiazem  240 mg Oral Daily  . heparin  5,000 Units Subcutaneous Q8H  . irbesartan  75 mg Oral Daily   And  . hydrochlorothiazide  12.5 mg Oral Daily  . latanoprost  1 drop Both Eyes QHS  . mometasone-formoterol  2 puff Inhalation BID  . potassium chloride  40 mEq Oral Once  . sodium chloride flush  3 mL Intravenous Q12H    Family History         Family History  Problem Relation Age of Onset  . Heart disease Mother   . Rheumatic fever Mother   . Bipolar disorder Daughter   . Pneumonia Father   . Colon cancer Neg Hx      Social History    Social History        Social History  . Marital status: Married    Spouse name: N/A  . Number of children: 3  . Years of education: N/A       Occupational History  . retired Retired         Social  History Main Topics  . Smoking status: Former Smoker    Packs/day: 0.30    Years: 10.00    Types: Cigarettes    Quit date: 09/19/1948  . Smokeless tobacco: Never Used  . Alcohol use No     Comment: rarely  . Drug use: No  . Sexual activity: No       Other Topics Concern  . Not on file      Social History Narrative   [place]; [place]. Married  68 - spouse s/p valve surgery with resulting paralysis hemidiaphragm ('10). 3 daughter, 5 grandsons, 4 granddaughters.  1 daughter bipolar - social issues. She remains very active, but can't play tennis. Pt was apprently ex-smoker, but husband does not recollect pt ever smoking. Stressful move to smaller quarters (fall '09) and now moving to [place] (fall '12).      ACP - she clearly states No CPR, no mechanical ventilation and that quality of life is of key importance. Provided MOST sample, directed to [email or web address] and provided an Out of Facility Order. (Jan '14)     Review of Systems    General:  No chills, fever, night sweats or weight changes.  Cardiovascular:  No chest pain, dyspnea on exertion, edema, orthopnea, palpitations, paroxysmal nocturnal dyspnea. Dermatological: No rash, lesions/masses Respiratory: No cough, dyspnea Urologic: No hematuria, dysuria Abdominal:   No nausea, vomiting, diarrhea, bright red blood per rectum, melena, or hematemesis Neurologic:  No visual changes, wkns, changes in mental status. All other systems reviewed and are otherwise negative except as noted above.  Physical Exam    Blood pressure (!) 146/61, pulse (!) 150, temperature 97.9 F (36.6 C), temperature source Oral, resp. rate 20, height _0  (1.651 m), weight 130 lb 3.2 oz (59.1 kg), SpO2 96 %.  General: Pleasant, NAD Psych: Normal affect. Neuro: Alert and oriented X 3. Moves all extremities spontaneously. HEENT: Normal           Neck: Supple without bruits or JVD. Lungs:  Resp  regular and unlabored, CTA. Heart: RRR no s3, s4, or murmurs. Abdomen: Soft, non-tender, non-distended, BS + x 4.  Extremities: No clubbing, cyanosis or edema. DP/PT/Radials 2+ and equal bilaterally.  Labs    Troponin (Point of Care Test) Recent Labs (last 2 labs)   No results for input(s): TROPIPOC in the last 72 hours.    Recent Labs (last 2 labs)    Recent Labs  12/25/16 1900 12/26/16 0605  TROPONINI <0.03 <0.03     Recent Labs       Lab Results  Component Value Date   WBC 2.6 (L) 12/25/2016   HGB 10.2 (L) 12/25/2016   HCT 30.3 (L) 12/25/2016   MCV 100.7 (H) 12/25/2016   PLT 593 (H) 12/25/2016      Recent Labs Lab 12/25/16 1550  NA 138  K 3.7  CL 106  CO2 23  BUN 23*  CREATININE 0.76  0.77  CALCIUM 9.1  GLUCOSE 124*   Recent Labs       Lab Results  Component Value Date   CHOL 247 (H) 06/05/2014   HDL 75.60 06/05/2014   LDLCALC 156 (H) 06/05/2014   TRIG 78.0 06/05/2014     Recent Labs       Lab Results  Component Value Date   DDIMER 0.65 (H) 02/04/2014       Radiology Studies     Imaging Results  Dg Chest 2 View  Result Date: 12/25/2016 CLINICAL DATA:  Witnessed loss of consciousness. EXAM: CHEST  2 VIEW COMPARISON:  May 29, 2015 FINDINGS: The heart size and mediastinal contours are within normal limits. Both lungs are clear. The visualized skeletal structures are unremarkable. IMPRESSION: No active cardiopulmonary disease. Electronically Signed   By: Dorise Bullion III M.D   On: 12/25/2016 14:43     EKG & Cardiac Imaging    EKG: multifocal atrial tachycardia, there are some P waves present. 122 bpm. QTC 515  Echocardiogram: Pending  Last echo on 04/27/2016  Study Conclusions  - Left ventricle: The cavity size was normal. There was mild focal basal hypertrophy of the septum. Systolic function was normal. The estimated ejection fraction was in the range of 60% to 65%. Wall motion was normal; there were  no regional wall motion abnormalities. Features are consistent with a pseudonormal left ventricular filling pattern, with concomitant abnormal relaxation and increased filling pressure (grade 2 diastolic dysfunction). Doppler parameters are consistent with high ventricular filling pressure. - Mitral valve: Calcified annulus. There was trivial regurgitation. - Atrial septum: There was increased thickness of the septum, consistent with lipomatous hypertrophy. - Tricuspid valve: There was mild regurgitation. - Pulmonic valve: There was mild regurgitation.  Impressions:  - Normal LVF. Mild MAC with trivial MR. Mild TR and PR. Moderately thickened and mildly calcified AV with no AS. Grade 2 diastolic dysfunction with average E/e&'>15 suggestive of elevated LV filling pressures.   Assessment & Plan    Multifocal atrial tachycardia -In the past she has not definitively shown atrial fibrillation. EKG shows some p waves, with rapid rate of 122 bpm. This was thought to be multifocal atrial tachycardia. -She is not anticoagulated as this is not felt to be atrial fibrillation and aside from that, anticoagulation is contraindicated -She had been controlled outpatient with diltiazem 240 mg. She was on amiodarone previously to suppress her MAT and symptoms, but discontinued that due to skin discoloration. Currently she is being loaded with amiodarone IV, would discontinue this.  -SCr is normal.  K+ 3.7,  Mag 2.1 -Last echo 04/27/16 showed normal EF 60-65%, grade 2 DD, Mild MAC with trivial MR, Mild MR and TR. Repeat echo pending.   Exertional dyspnea -Per last cardiology note- was thought to be more related to pulmonary issues. Echo is pending.   HTN -Home meds include diltiazem 240 mg, and Diovan-HCT 80-12.5 mg (which was decreased earlier this year due to hypotension) -In-patient she is on HCTZ 12.5 mg, irbesartan 75 mg. BP is fairly well controlled. Continue to monitor.     QT prolongation -significant with use of levofloxacin in the past. She should avoid fluoroquinolones and other QT-prolonging agents. Prior QT interval have been mildly prolonged.   MDS/MPN -Hgb 10, At baseline. WBCs 2.6 -Management per IM  SignedDaune Perch, NP-C 12/26/2016, 12:03 PM Pager: [phone]  Patient examined chart reviewed. Currently asymptomatic Exam with elderly female clear lungs no murmur no edema. Telemetry review with no PAF Has fairly classic MAT. Rx with calcium blocker no amiodarone Does not need anticoagulation. Should not cause syncope. She has skin discoloration With oral amiodarone in past and would not be transitioned to this anyway.  Ok to d/c home in am   [employer]

## 2016-12-26 NOTE — Progress Notes (Signed)
  Echocardiogram 2D Echocardiogram has been performed.  Lori [name] 12/26/2016, 1:07 PM

## 2016-12-26 NOTE — Progress Notes (Signed)
PT Cancellation Note  Patient Details Name: Lori [name] MRN: [id number] DOB: 08-08-1926   Cancelled Treatment:    Reason Eval/Treat Not Completed: Patient at procedure or test/unavailable. Will check back as time allows.    [place] 12/26/2016, 12:51 PM

## 2016-12-26 NOTE — Progress Notes (Signed)
Pt's daughter requesting to speak with MD.  MD paged with daughters phone number.

## 2016-12-26 NOTE — Progress Notes (Signed)
Pts HR sustaining 130's-140's and pt c/o dizziness.  BP 144/58.  MD paged.
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
## 2016-12-27 ENCOUNTER — Telehealth: Payer: Self-pay | Admitting: General Practice

## 2016-12-27 ENCOUNTER — Ambulatory Visit: Payer: Medicare Other | Admitting: Nurse Practitioner

## 2016-12-27 DIAGNOSIS — I471 Supraventricular tachycardia: Secondary | ICD-10-CM

## 2016-12-27 LAB — GLUCOSE, CAPILLARY: Glucose-Capillary: 87 mg/dL (ref 65–99)

## 2016-12-27 MED ORDER — CEPHALEXIN 500 MG PO CAPS: 500.0000 mg | ORAL_CAPSULE | Freq: Two times a day (BID) | ORAL | 0 refills | Status: DC

## 2016-12-27 MED ORDER — CLONAZEPAM 0.5 MG PO TABS: 0.2500 mg | ORAL_TABLET | Freq: Three times a day (TID) | ORAL | 0 refills | Status: DC | PRN

## 2016-12-27 MED ORDER — ANAGRELIDE HCL 0.5 MG PO CAPS: 0.5000 mg | ORAL_CAPSULE | ORAL | Status: DC

## 2016-12-27 NOTE — Progress Notes (Signed)
Progress Note  Patient Name: Lori [name] Date of Encounter: 12/27/2016  Primary Cardiologist: Dr. Aundra Dubin  Subjective   Denies any chest pain or palpitations. Breathing at baseline. Anxious to go home this morning as she is the primary caregiver for her Husband who has cancer.   Inpatient Medications    Scheduled Meds: . anagrelide  0.5 mg Oral QHS  . anagrelide  1 mg Oral Daily  . aspirin EC  81 mg Oral Daily  . cefTRIAXone (ROCEPHIN)  IV  1 g Intravenous Q24H  . dicyclomine  10 mg Oral TID AC  . diltiazem  240 mg Oral Daily  . heparin  5,000 Units Subcutaneous Q8H  . irbesartan  75 mg Oral Daily   And  . hydrochlorothiazide  12.5 mg Oral Daily  . latanoprost  1 drop Both Eyes QHS  . mometasone-formoterol  2 puff Inhalation BID  . sodium chloride flush  3 mL Intravenous Q12H   Continuous Infusions:  PRN Meds: acetaminophen, alum & mag hydroxide-simeth, clonazePAM, HYDROcodone-acetaminophen, ondansetron **OR** ondansetron (ZOFRAN) IV, polyethylene glycol, traZODone, zolpidem   Vital Signs    Vitals:   12/26/16 2015 12/27/16 0100 12/27/16 0354 12/27/16 0656  BP: (!) 132/51 (!) 141/41  (!) 134/48  Pulse: 78 78  75  Resp: 16 16  20   Temp: 98.3 F (36.8 C) 98.4 F (36.9 C)  98.4 F (36.9 C)  TempSrc: Oral Oral  Oral  SpO2: 96% 95%  96%  Weight:   135 lb 6.4 oz (61.4 kg)   Height:        Intake/Output Summary (Last 24 hours) at 12/27/16 0809 Last data filed at 12/27/16 0600  Gross per 24 hour  Intake           303.71 ml  Output              800 ml  Net          -496.29 ml   Filed Weights   12/25/16 2018 12/26/16 0508 12/27/16 0354  Weight: 131 lb 3.2 oz (59.5 kg) 130 lb 3.2 oz (59.1 kg) 135 lb 6.4 oz (61.4 kg)    Telemetry    MAT with HR in the 120's --> Conversion to NSR on 4/9 at 1800. Maintaining NSR with HR in the 60's - 70's. - Personally Reviewed  ECG    NSR, HR 77, with prolonged QTc (491 ms) - Personally Reviewed  Physical Exam   General:  Well developed, well nourished elderly Caucasian female appearing in no acute distress. Head: Normocephalic, atraumatic.  Neck: Supple without bruits, JVD not elevated. Lungs:  Resp regular and unlabored, CTA without wheezing or rales. Heart: RRR, S1, S2, no S3, S4, or murmur; no rub. Abdomen: Soft, non-tender, non-distended with normoactive bowel sounds. No hepatomegaly. No rebound/guarding. No obvious abdominal masses. Extremities: No clubbing, cyanosis, or lower extremity edema. Distal pedal pulses are 2+ bilaterally. Neuro: Alert and oriented X 3. Moves all extremities spontaneously. Psych: Normal affect.  Labs    Chemistry Recent Labs Lab 12/25/16 1550 12/26/16 1306  NA 138 134*  K 3.7 3.3*  CL 106 102  CO2 23 22  GLUCOSE 124* 149*  BUN 23* 17  CREATININE 0.76  0.77 0.71  CALCIUM 9.1 8.6*  GFRNONAA >60  >60 >60  GFRAA >60  >60 >60  ANIONGAP 9 10     Hematology Recent Labs Lab 12/25/16 1550 12/25/16 2019  WBC 2.9* 2.6*  RBC 3.04* 3.01*  HGB 10.0* 10.2*  HCT 30.8* 30.3*  MCV 101.3* 100.7*  MCH 32.9 33.9  MCHC 32.5 33.7  RDW 16.4* 16.9*  PLT 592* 593*    Cardiac Enzymes Recent Labs Lab 12/25/16 1900 12/26/16 0605  TROPONINI <0.03 <0.03   No results for input(s): TROPIPOC in the last 168 hours.   BNPNo results for input(s): BNP, PROBNP in the last 168 hours.   DDimer No results for input(s): DDIMER in the last 168 hours.   Radiology    Dg Chest 2 View  Result Date: 12/25/2016 CLINICAL DATA:  Witnessed loss of consciousness. EXAM: CHEST  2 VIEW COMPARISON:  May 29, 2015 FINDINGS: The heart size and mediastinal contours are within normal limits. Both lungs are clear. The visualized skeletal structures are unremarkable. IMPRESSION: No active cardiopulmonary disease. Electronically Signed   By: Dorise Bullion III M.D   On: 12/25/2016 14:43    Cardiac Studies   Echocardiogram: 12/26/2016 Study Conclusions  - Left ventricle: The cavity size was  normal. There was mild   concentric hypertrophy. Systolic function was normal. The   estimated ejection fraction was in the range of 60% to 65%. Wall   motion was normal; there were no regional wall motion   abnormalities. The study was not technically sufficient to allow   evaluation of LV diastolic dysfunction due to atrial   fibrillation. - Aortic valve: Trileaflet; normal thickness leaflets. There was no   regurgitation. - Aortic root: The aortic root was normal in size. - Right ventricle: The cavity size was normal. Wall thickness was   normal. Systolic function was normal. - Right atrium: The atrium was normal in size. - Tricuspid valve: There was mild regurgitation. - Pulmonary arteries: Systolic pressure was moderately increased.   PA peak pressure: 49 mm Hg (S). - Systemic veins: Not visualized. - Pericardium, extracardiac: There was no pericardial effusion.  Patient Profile     83 y.o. female w/ PMH of COPD, CAD, diverticulitis, pulmonary nodule and recent diagnosis of MDS/MPNwho presented to [facility] on 12/25/2016 for weakness and syncope. Cards consulted on 4/9 for MAT.   Assessment & Plan   1. Multifocal atrial tachycardia - In the past she has not definitively shown atrial fibrillation. EKG shows some p-waves, with rapid rate of 122 bpm. This is thought to be most consistent with multifocal atrial tachycardia. Converted to NSR on 4/9 at 1800. Maintaining NSR with HR in the 70's - 80's.  - She was on Diltiazem CD 240 mg daily as an outpatient. She was on Amiodarone previously to suppress her MAT and symptoms, but discontinued that due to skin discoloration.  - echo this admission shows a preserved EF of 60-65% with no significant valve abnormalities. Continue PTA Cardizem CD. No other medication adjustments indicated at this time.   2. Exertional dyspnea - this has been a chronic issues for the past. Per last office cardiology note, was thought to be more related to  pulmonary issues. Echo with no significant changes.   3. HTN - BP well-controlled at 127/41 - 147/98 in the past 24 hours.  - continue current medication regimen.   4. QT prolongation - significant with use of Levofloxacin in the past. She should avoid fluoroquinolones and other QT-prolonging agents.    5. MDS/MPN - Hgb 10.2 on 12/25/2016. - she denies any evidence of active bleeding.   Signed, Erma Heritage , PA-C 8:09 AM 12/27/2016 Pager: [phone]  Telemetry stable MAT not cause of syncope increase cardizem to 360 mg daily  outpatient f/u with Dr Maceo Pro to d/c home Exam benign clear lungs no murmurs no edema  Jenkins Rouge

## 2016-12-27 NOTE — Care Management Note (Signed)
Case Management Note  Patient Details  Name: Lori [name] MRN: [id number] Date of Birth: [birth year]  Subjective/Objective:    Admitted with Syncope               Action/Plan: Patient is a resident at [facility]; CM called [facility] and they have an onsite physical therapy. VM left for fax number to fax orders for PT; awaiting callback;  Expected Discharge Date:  12/27/16               Expected Discharge Plan:  [place]  Discharge planning Services  CM Consult  Status of Service:  In process, will continue to follow  Sherrilyn Rist [phone] 12/27/2016, 10:55 AM

## 2016-12-27 NOTE — Discharge Instructions (Signed)
Follow with [name], MD in 5-7 days  Please get a complete blood count and chemistry panel checked by your Primary MD at your next visit, and again as instructed by your Primary MD. Please get your medications reviewed and adjusted by your Primary MD.  Please request your Primary MD to go over all Hospital Tests and Procedure/Radiological results at the follow up, please get all Hospital records sent to your Prim MD by signing hospital release before you go home.  If you had Pneumonia of Lung problems at the Hospital: Please get a 2 view Chest X ray done in 6-8 weeks after hospital discharge or sooner if instructed by your Primary MD.  If you have Congestive Heart Failure: Please call your Cardiologist or Primary MD anytime you have any of the following symptoms:  1) 3 pound weight gain in 24 hours or 5 pounds in 1 week  2) shortness of breath, with or without a dry hacking cough  3) swelling in the hands, feet or stomach  4) if you have to sleep on extra pillows at night in order to breathe  Follow cardiac low salt diet and 1.5 lit/day fluid restriction.  If you have diabetes Accuchecks 4 times/day, Once in AM empty stomach and then before each meal. Log in all results and show them to your primary doctor at your next visit. If any glucose reading is under 80 or above 300 call your primary MD immediately.  If you have Seizure/Convulsions/Epilepsy: Please do not drive, operate heavy machinery, participate in activities at heights or participate in high speed sports until you have seen by Primary MD or a Neurologist and advised to do so again.  If you had Gastrointestinal Bleeding: Please ask your Primary MD to check a complete blood count within one week of discharge or at your next visit. Your endoscopic/colonoscopic biopsies that are pending at the time of discharge, will also need to followed by your Primary MD.  Get Medicines reviewed and adjusted. Please take all your  medications with you for your next visit with your Primary MD  Please request your Primary MD to go over all hospital tests and procedure/radiological results at the follow up, please ask your Primary MD to get all Hospital records sent to his/her office.  If you experience worsening of your admission symptoms, develop shortness of breath, life threatening emergency, suicidal or homicidal thoughts you must seek medical attention immediately by calling 911 or calling your MD immediately  if symptoms less severe.  You must read complete instructions/literature along with all the possible adverse reactions/side effects for all the Medicines you take and that have been prescribed to you. Take any new Medicines after you have completely understood and accpet all the possible adverse reactions/side effects.   Do not drive or operate heavy machinery when taking Pain medications.   Do not take more than prescribed Pain, Sleep and Anxiety Medications  Special Instructions: If you have smoked or chewed Tobacco  in the last 2 yrs please stop smoking, stop any regular Alcohol  and or any Recreational drug use.  Wear Seat belts while driving.  Please note You were cared for by a hospitalist during your hospital stay. If you have any questions about your discharge medications or the care you received while you were in the hospital after you are discharged, you can call the unit and asked to speak with the hospitalist on call if the hospitalist that took care of you is not available. Once  you are discharged, your primary care physician will handle any further medical issues. Please note that NO REFILLS for any discharge medications will be authorized once you are discharged, as it is imperative that you return to your primary care physician (or establish a relationship with a primary care physician if you do not have one) for your aftercare needs so that they can reassess your need for medications and monitor your  lab values.  You can reach the hospitalist office at phone [phone] or fax [phone]   If you do not have a primary care physician, you can call [phone] for a physician referral.  Activity: As tolerated with Full fall precautions use walker/cane & assistance as needed

## 2016-12-27 NOTE — Telephone Encounter (Signed)
I spoke with Mr. on 11/30/16 to confirm PCP.  He stated that they're ok and hung up. Duane Lope ([place])

## 2016-12-27 NOTE — Progress Notes (Signed)
Patient with no complaints or concerns during 7pm - 7am shift.  Gavon Majano, RN 

## 2016-12-27 NOTE — Progress Notes (Signed)
Patient HR has been in 80's, NSR during the night. Paged [facility] on call A. Hugelmeyer and cardiology MD Aundra Dubin and informed them,and asked should Amiodarone be continued. No response back. Will continue Amiodarone and keep monitor on the patient.    Noriah Osgood, RN

## 2016-12-27 NOTE — Discharge Summary (Signed)
Physician Discharge Summary  Lori [name] UEK:[id number] DOB: 03-27-1926 DOA: 12/25/2016  PCP: Lori Lopes, MD  Admit date: 12/25/2016 Discharge date: 12/27/2016   Recommendations for Outpatient Follow-Up:   1. Home with home health 2. If BP stable at next visit can increase cardiazem to 360 mg daily   Discharge Diagnosis:   Active Problems:   Syncope MAT UTI dehydration  Discharge disposition:  Home with home health  Discharge Condition: Improved.  Diet recommendation: Low sodium, heart healthy.   Wound care: None.   History of Present Illness:   Lori [name] is a 83 y.o. female with medical history significant of Atrial fibrillation Asthma Copd, CAD, Diverticulitis, pulmonary nodule and recent diagnosis of MDS/MPN.   Pt reports today she passed out after going to the dining room.  Pt reports she has had recent increasing weakness.  Pt lives at [place] but reports increasing difficulty getting around. Pt reports vomiting x 1 today.   She states she has had some diarrhea on and off.  She reports she has been losing weight. Pt had blood transfusion approximately one month ago. She is being followed by Dr. Burr Medico with Oncology.    Hospital Course by Problem:   MAT -seen by cards, no a fib -on cardiazem and will continue -cardiology suspects anxiety playing a role-- low dose klonopin added PRN  UTI -keflex x 3 days (tolerated rocephin in hospital)  Dehydration -given IVF  MDS/MPN - Hgb 10.2 on 12/25/2016. - she denies any evidence of active bleeding -follows with Dr. Burr Medico  Hypokalemia -replaced -Mg ok    Medical Consultants:    None.   Discharge Exam:   Vitals:   12/27/16 0656 12/27/16 0828  BP: (!) 134/48 (!) 167/71  Pulse: 75 80  Resp: 20   Temp: 98.4 F (36.9 C)    Vitals:   12/27/16 0100 12/27/16 0354 12/27/16 0656 12/27/16 0828  BP: (!) 141/41  (!) 134/48 (!) 167/71  Pulse: 78  75 80  Resp: 16  20   Temp: 98.4 F (36.9 C)   98.4 F (36.9 C)   TempSrc: Oral  Oral   SpO2: 95%  96%   Weight:  61.4 kg (135 lb 6.4 oz)    Height:        Gen:  NAD    The results of significant diagnostics from this hospitalization (including imaging, microbiology, ancillary and laboratory) are listed below for reference.     Procedures and Diagnostic Studies:   Dg Chest 2 View  Result Date: 12/25/2016 CLINICAL DATA:  Witnessed loss of consciousness. EXAM: CHEST  2 VIEW COMPARISON:  May 29, 2015 FINDINGS: The heart size and mediastinal contours are within normal limits. Both lungs are clear. The visualized skeletal structures are unremarkable. IMPRESSION: No active cardiopulmonary disease. Electronically Signed   By: Lori [name] M.D   On: 12/25/2016 14:43     Labs:   Basic Metabolic Panel:  Recent Labs Lab 12/25/16 1550 12/26/16 1306  NA 138 134*  K 3.7 3.3*  CL 106 102  CO2 23 22  GLUCOSE 124* 149*  BUN 23* 17  CREATININE 0.76  0.77 0.71  CALCIUM 9.1 8.6*  MG 2.1  --    GFR Estimated Creatinine Clearance: 43.7 mL/min (by C-G formula based on SCr of 0.71 mg/dL). Liver Function Tests: No results for input(s): AST, ALT, ALKPHOS, BILITOT, PROT, ALBUMIN in the last 168 hours. No results for input(s): LIPASE, AMYLASE in the last 168 hours. No results  for input(s): AMMONIA in the last 168 hours. Coagulation profile No results for input(s): INR, PROTIME in the last 168 hours.  CBC:  Recent Labs Lab 12/25/16 1550 12/25/16 2019  WBC 2.9* 2.6*  HGB 10.0* 10.2*  HCT 30.8* 30.3*  MCV 101.3* 100.7*  PLT 592* 593*   Cardiac Enzymes:  Recent Labs Lab 12/25/16 1900 12/26/16 0605  TROPONINI <0.03 <0.03   BNP: Invalid input(s): POCBNP CBG:  Recent Labs Lab 12/25/16 1343 12/26/16 0620 12/27/16 0540  GLUCAP 146* 73 87   D-Dimer No results for input(s): DDIMER in the last 72 hours. Hgb A1c No results for input(s): HGBA1C in the last 72 hours. Lipid Profile No results for input(s):  CHOL, HDL, LDLCALC, TRIG, CHOLHDL, LDLDIRECT in the last 72 hours. Thyroid function studies  Recent Labs  12/25/16 1550  TSH 2.131   Anemia work up No results for input(s): VITAMINB12, FOLATE, FERRITIN, TIBC, IRON, RETICCTPCT in the last 72 hours. Microbiology No results found for this or any previous visit (from the past 240 hour(s)).   Discharge Instructions:   Discharge Instructions    Diet - low sodium heart healthy    Complete by:  As directed    Discharge instructions    Complete by:  As directed    Home health PT   Increase activity slowly    Complete by:  As directed      Allergies as of 12/27/2016      Reactions   Albuterol Sulfate Palpitations   Levaquin [levofloxacin In D5w] Other (See Comments)   QT prolongation. Should avoid all flouroquinolones.    Doxycycline Diarrhea   Lactose Intolerance (gi) Other (See Comments)   Amoxicillin Other (See Comments)   REACTION: unspecified   Codeine Other (See Comments)   Can take Hydrocodone   Penicillins Hives, Itching, Rash   Hard lump and rash Has patient had a PCN reaction causing immediate rash, facial/tongue/throat swelling, SOB or lightheadedness with hypotension: Yes Has patient had a PCN reaction causing severe rash involving mucus membranes or skin necrosis: No Has patient had a PCN reaction that required hospitalization unknown Has patient had a PCN reaction occurring within the last 10 years: No If all of the above answers are "NO", then may proceed with Cephalosporin use.   Sulfa Antibiotics Nausea Only   Sulfonamide Derivatives Nausea Only      Medication List    STOP taking these medications   nitrofurantoin (macrocrystal-monohydrate) 100 MG capsule Commonly known as:  MACROBID     TAKE these medications   acetaminophen 500 MG tablet Commonly known as:  TYLENOL Take 1,000 mg by mouth every 6 (six) hours as needed for headache (pain).   anagrelide 0.5 MG capsule Commonly known as:   AGRYLIN Take 1-2 capsules (0.5-1 mg total) by mouth See admin instructions. Take 2 tablets (1 mg) by mouth every morning and 1 tablet (0.5 mg) at night What changed:  how much to take  how to take this  when to take this  additional instructions   aspirin EC 81 MG tablet Take 1 tablet (81 mg total) by mouth daily.   bimatoprost 0.01 % Soln Commonly known as:  LUMIGAN Place 1 drop into both eyes daily. What changed:  when to take this   budesonide-formoterol 160-4.5 MCG/ACT inhaler Commonly known as:  SYMBICORT Inhale 2 puffs into the lungs as needed (allergies). What changed:  when to take this   cephALEXin 500 MG capsule Commonly known as:  KEFLEX Take 1 capsule (  500 mg total) by mouth 2 (two) times daily.   clindamycin 300 MG capsule Commonly known as:  CLEOCIN Take 600 mg by mouth See admin instructions. Take 2 capsules (600 mg) by mouth one hour prior to dental procedures   clonazePAM 0.5 MG tablet Commonly known as:  KLONOPIN Take 0.5 tablets (0.25 mg total) by mouth 3 (three) times daily as needed (anxiety).   dicyclomine 10 MG capsule Commonly known as:  BENTYL Take 1 capsule (10 mg total) by mouth 3 (three) times daily as needed. IBS What changed:  when to take this  additional instructions   diltiazem 240 MG 24 hr capsule Commonly known as:  CARDIZEM CD Take 1 capsule (240 mg total) by mouth daily.   loperamide 2 MG capsule Commonly known as:  IMODIUM Take 2 mg by mouth as needed for diarrhea or loose stools.   magnesium oxide 400 MG tablet Commonly known as:  MAG-OX Take 400 mg by mouth daily as needed (leg cramps).   multivitamin with minerals Tabs tablet Take 1 tablet by mouth daily.   valsartan-hydrochlorothiazide 80-12.5 MG tablet Commonly known as:  DIOVAN-HCT Take 1 tablet by mouth daily.   zolpidem 5 MG tablet Commonly known as:  AMBIEN Take 1 tablet (5 mg total) by mouth at bedtime as needed for sleep.      Follow-up Information     Truitt Merle, NP Follow up on 01/11/2017.   Specialties:  Nurse Practitioner, Interventional Cardiology, Cardiology, Radiology Why:  [place] on 01/11/2017 at 3:00PM.  Contact information: [place]. [address] [place] [address] [phone]            Time coordinating discharge: 35 min  Signed:  Oletha Tolson U Aiya Keach   [facility] 12/27/2016, 8:52 AM

## 2016-12-28 LAB — URINE CULTURE

## 2016-12-30 ENCOUNTER — Telehealth: Payer: Self-pay | Admitting: Hematology

## 2016-12-30 NOTE — Telephone Encounter (Signed)
Added MD appt per 4/6 LOS. S/w pt, gave new appt time for 4/16 @ 3pm

## 2017-01-02 ENCOUNTER — Encounter: Payer: Self-pay | Admitting: Hematology

## 2017-01-02 ENCOUNTER — Other Ambulatory Visit: Payer: Medicare Other

## 2017-01-02 ENCOUNTER — Ambulatory Visit: Payer: Medicare Other

## 2017-01-02 ENCOUNTER — Telehealth: Payer: Self-pay | Admitting: Hematology

## 2017-01-02 ENCOUNTER — Other Ambulatory Visit (HOSPITAL_BASED_OUTPATIENT_CLINIC_OR_DEPARTMENT_OTHER): Payer: Medicare Other

## 2017-01-02 ENCOUNTER — Ambulatory Visit (HOSPITAL_BASED_OUTPATIENT_CLINIC_OR_DEPARTMENT_OTHER): Payer: Medicare Other

## 2017-01-02 ENCOUNTER — Ambulatory Visit (HOSPITAL_BASED_OUTPATIENT_CLINIC_OR_DEPARTMENT_OTHER): Payer: Medicare Other | Admitting: Hematology

## 2017-01-02 VITALS — BP 135/51 | HR 94 | Temp 98.6°F | Resp 18 | Ht 65.0 in | Wt 133.3 lb

## 2017-01-02 DIAGNOSIS — F419 Anxiety disorder, unspecified: Secondary | ICD-10-CM

## 2017-01-02 DIAGNOSIS — R5383 Other fatigue: Secondary | ICD-10-CM

## 2017-01-02 DIAGNOSIS — H04123 Dry eye syndrome of bilateral lacrimal glands: Secondary | ICD-10-CM | POA: Diagnosis not present

## 2017-01-02 DIAGNOSIS — D61818 Other pancytopenia: Secondary | ICD-10-CM

## 2017-01-02 DIAGNOSIS — D469 Myelodysplastic syndrome, unspecified: Secondary | ICD-10-CM | POA: Diagnosis not present

## 2017-01-02 DIAGNOSIS — D519 Vitamin B12 deficiency anemia, unspecified: Secondary | ICD-10-CM

## 2017-01-02 DIAGNOSIS — I10 Essential (primary) hypertension: Secondary | ICD-10-CM

## 2017-01-02 DIAGNOSIS — G4700 Insomnia, unspecified: Secondary | ICD-10-CM

## 2017-01-02 DIAGNOSIS — F329 Major depressive disorder, single episode, unspecified: Secondary | ICD-10-CM | POA: Diagnosis not present

## 2017-01-02 DIAGNOSIS — E538 Deficiency of other specified B group vitamins: Secondary | ICD-10-CM | POA: Diagnosis not present

## 2017-01-02 LAB — CBC & DIFF AND RETIC
BASO%: 0 % (ref 0.0–2.0)
BASOS ABS: 0 10*3/uL (ref 0.0–0.1)
EOS ABS: 0 10*3/uL (ref 0.0–0.5)
EOS%: 0.4 % (ref 0.0–7.0)
HEMATOCRIT: 28.9 % — AB (ref 34.8–46.6)
HGB: 9.3 g/dL — ABNORMAL LOW (ref 11.6–15.9)
Immature Retic Fract: 5.1 % (ref 1.60–10.00)
LYMPH%: 48.9 % (ref 14.0–49.7)
MCH: 32.9 pg (ref 25.1–34.0)
MCHC: 32.2 g/dL (ref 31.5–36.0)
MCV: 102.1 fL — AB (ref 79.5–101.0)
MONO#: 0.1 10*3/uL (ref 0.1–0.9)
MONO%: 2.7 % (ref 0.0–14.0)
NEUT#: 1.1 10*3/uL — ABNORMAL LOW (ref 1.5–6.5)
NEUT%: 48 % (ref 38.4–76.8)
NRBC: 0 % (ref 0–0)
Platelets: 712 10*3/uL — ABNORMAL HIGH (ref 145–400)
RBC: 2.83 10*6/uL — ABNORMAL LOW (ref 3.70–5.45)
RDW: 16 % — AB (ref 11.2–14.5)
Retic %: 2.12 % — ABNORMAL HIGH (ref 0.70–2.10)
Retic Ct Abs: 60 10*3/uL (ref 33.70–90.70)
WBC: 2.3 10*3/uL — ABNORMAL LOW (ref 3.9–10.3)
lymph#: 1.1 10*3/uL (ref 0.9–3.3)

## 2017-01-02 MED ORDER — DARBEPOETIN ALFA 150 MCG/0.3ML IJ SOSY
150.0000 ug | PREFILLED_SYRINGE | Freq: Once | INTRAMUSCULAR | Status: AC
  Administered 2017-01-02: 150 ug via SUBCUTANEOUS
  Filled 2017-01-02: qty 0.3

## 2017-01-02 NOTE — Progress Notes (Signed)
[place]  [phone] [phone]  Clinic Follow up Note   Patient Care Team: Leanna Battles, MD as PCP - General (Internal Medicine) Brand Males, MD as Consulting Physician (Pulmonary Disease) [name], MD as Consulting Physician (Orthopedic Surgery) 01/02/2017  CHIEF COMPLAIN: Extreme fatigue  HISTORY OF PRESENT ILLNESS (From Dr. Laverle Patter note on 10/11/2016): Lori [name] 83 y.o. female is here for evaluation of leukopenia. Patient states that she's been having severe fatigue since November 2017. She was having trouble with her atrial fibrillation again, however this has improved. CBC from 12/617 demonstrated WBC 4.83K, hemoglobin 10.9 g/dL, hematocrit 31.8%, MCV 105.4, place a count 764K. CBC from 04/11/16 demonstrated WBC 4.1K, hemoglobin 12.5 g/dL, hematocrit 38.1%, platelet count 693K. Most recent CBC from 09/28/16 demonstrated worsening of her leukopenia and anemia with WBC 2K, hemoglobin 9.6 g/dL, hematocrit 28.5%, MCV 107, platelet count 748K. She also has diarrhea about 3-4 times a day, however she states that this is been a lifelong problem for her. She has lost about 5 pounds in the past 2 months. She also has occasional shortness of breath. She denies any recent changes in her medications.  CURRENT THERAPY:  1. Aranesp 141mg every 2 weeks, started on 11/18/2016 2. Anagrelide 0.'5mg'$  BID started on 11/11/2016, increased to 1.'0mg'$  in am and 0.'5mg'$  in pm on 12/06/2016, and '1mg'$  bid on 01/03/2017  INTERVAL HISTORY: She returns for follow up. She states she is doing poorly. She has hearing loss in both ears. She has some tightness in her chest. She passed out at home last week, and then vomited. She was taken to the hospital from Sunday to Tuesday. She is very fatigued and short of breath and stays in bed most of the time. Pt had some diarrhea this past week and feels some occasional nausea after eating. She continues to feel light headed daily. She sees  her cardiologist tomorrow. Denies abdominal pain, or any other concerns.   REVIEW OF SYSTEMS:   Constitutional: Denies fevers, chills or abnormal weight loss  Eyes: Denies blurriness of vision Ears, nose, mouth, throat, and face: Denies mucositis or sore throat (+) hearing loss Respiratory: Denies cough, dyspnea or wheezes (+) SOB Cardiovascular: Denies palpitation, chest discomfort or lower extremity swelling (+) chest tightness Gastrointestinal:  Denies nausea, heartburn or change in bowel habits (+) nausea (+) diarrhea (+) vomiting Skin: Denies abnormal skin rashes Lymphatics: Denies new lymphadenopathy  Neurological:Denies numbness, tingling or new weaknesses (+) syncope (+) light headed Behavioral/Psych: Mood is stable, no new changes  All other systems were reviewed with the patient and are negative.  MEDICAL HISTORY:  Past Medical History:  Diagnosis Date  . ALLERGIC RHINITIS   . Anxiety   . Asthma   . Atrial fibrillation ([place]   . Back pain of thoracolumbar region    right  . Bronchiectasis   . Colles' fracture of left radius   . COPD (chronic obstructive pulmonary disease) ([place]   . Coronary artery disease   . Diastolic dysfunction   . Diverticulosis   . Dysrhythmia   . History of pneumonia   . Hx of cardiovascular stress test    a. ETT-MV 1/14: Exercised 4:16, no ECG changes, poor ex tol, ant defect c/w soft tissue atten, no ischemia, EF 75%  . Hx of colonic polyp   . Hypertension   . IBS (irritable bowel syndrome)   . Insomnia   . Multiple rib fractures 10/2008   bilateral  . Pleural effusion, left   .  Pruritus   . Pulmonary nodule     SURGICAL HISTORY: Past Surgical History:  Procedure Laterality Date  . ANKLE CLOSED REDUCTION Right 12/11/2014   Procedure: CLOSED REDUCTION ANKLE;  Surgeon: Rod Can, MD;  Location: [facility];  Service: Orthopedics;  Laterality: Right;  . APPENDECTOMY    . BLADDER SUSPENSION  2005   A-P  . CHOLECYSTECTOMY    .  COLONOSCOPY  04/06/2012   Procedure: COLONOSCOPY;  Surgeon: Jerene Bears, MD;  Location: [facility];  Service: Gastroenterology;  Laterality: N/A;  . EXTERNAL FIXATION LEG Right 12/12/2014   Procedure: ADJUSTMENT OF EXTERNAL FIXATION  RIGHT ANKLE;  Surgeon: Rod Can, MD;  Location: [facility];  Service: Orthopedics;  Laterality: Right;  . EXTERNAL FIXATION LEG Right 12/11/2014   Procedure: POSSIBLE EXTERNAL FIXATION RIGHT ANKLE;  Surgeon: Rod Can, MD;  Location: [facility];  Service: Orthopedics;  Laterality: Right;  . HARDWARE REMOVAL Right 12/25/2014   Procedure: RIGHT ANKLE REMOVE OF DEEP IMPLANT ANE EXTERNAL FIXATOR ;  Surgeon: [name], MD;  Location: [facility];  Service: Orthopedics;  Laterality: Right;  . ORIF ANKLE FRACTURE Right 12/25/2014   Procedure: OPEN REDUCTION INTERNAL FIXATION (ORIF)TRIMALLEOLAR  ANKLE FRACTURE;  Surgeon: [name], MD;  Location: [facility];  Service: Orthopedics;  Laterality: Right;  . ORIF WRIST FRACTURE Left 2010     Dr. Burney Gauze.  Marland Kitchen POLYPECTOMY    . ROTATOR CUFF REPAIR Right 2006  . TONSILLECTOMY    . TONSILLECTOMY    . TOTAL KNEE ARTHROPLASTY Right 08/2010    I have reviewed the social history and family history with the patient and they are unchanged from previous note.  ALLERGIES:  is allergic to albuterol sulfate; levaquin [levofloxacin in d5w]; doxycycline; lactose intolerance (gi); amoxicillin; codeine; penicillins; sulfa antibiotics; and sulfonamide derivatives.  MEDICATIONS:  Current Outpatient Prescriptions  Medication Sig Dispense Refill  . acetaminophen (TYLENOL) 500 MG tablet Take 1,000 mg by mouth every 6 (six) hours as needed for headache (pain).    Marland Kitchen anagrelide (AGRYLIN) 0.5 MG capsule Take 1-2 capsules (0.5-1 mg total) by mouth See admin instructions. Take 2 tablets (1 mg) by mouth every morning and 1 tablet (0.5 mg) at night    . aspirin EC 81 MG tablet Take 1 tablet (81 mg total) by mouth daily. 90  tablet 3  . bimatoprost (LUMIGAN) 0.01 % SOLN Place 1 drop into both eyes daily. (Patient taking differently: Place 1 drop into both eyes at bedtime. ) 5 mL 3  . budesonide-formoterol (SYMBICORT) 160-4.5 MCG/ACT inhaler Inhale 2 puffs into the lungs as needed (allergies). (Patient taking differently: Inhale 2 puffs into the lungs 2 (two) times daily as needed (allergies). ) 1 Inhaler 2  . cephALEXin (KEFLEX) 500 MG capsule Take 1 capsule (500 mg total) by mouth 2 (two) times daily. 4 capsule 0  . clindamycin (CLEOCIN) 300 MG capsule Take 600 mg by mouth See admin instructions. Take 2 capsules (600 mg) by mouth one hour prior to dental procedures    . clonazePAM (KLONOPIN) 0.5 MG tablet Take 0.5 tablets (0.25 mg total) by mouth 3 (three) times daily as needed (anxiety). 15 tablet 0  . dicyclomine (BENTYL) 10 MG capsule Take 1 capsule (10 mg total) by mouth 3 (three) times daily as needed. IBS (Patient taking differently: Take 10 mg by mouth 3 (three) times daily before meals. IBS) 90 capsule 3  . diltiazem (CARDIZEM CD) 240 MG 24 hr capsule Take 1 capsule (240 mg  total) by mouth daily. 30 capsule 3  . loperamide (IMODIUM) 2 MG capsule Take 2 mg by mouth as needed for diarrhea or loose stools.     . magnesium oxide (MAG-OX) 400 MG tablet Take 400 mg by mouth daily as needed (leg cramps).    . Multiple Vitamin (MULTIVITAMIN WITH MINERALS) TABS tablet Take 1 tablet by mouth daily.    . valsartan-hydrochlorothiazide (DIOVAN-HCT) 80-12.5 MG tablet Take 1 tablet by mouth daily. 30 tablet 6  . zolpidem (AMBIEN) 5 MG tablet Take 1 tablet (5 mg total) by mouth at bedtime as needed for sleep. 30 tablet 5   No current facility-administered medications for this visit.     PHYSICAL EXAMINATION: ECOG PERFORMANCE STATUS: 3  Vitals:   01/02/17 1552  BP: (!) 135/51  Pulse: 94  Resp: 18  Temp: 98.6 F (37 C)   Filed Weights   01/02/17 1552  Weight: 133 lb 4.8 oz (60.5 kg)    GENERAL:alert, no distress  and comfortable SKIN: skin color, texture, turgor are normal, no rashes or significant lesions EYES: normal, Conjunctiva are pink and non-injected, sclera clear OROPHARYNX:no exudate, no erythema and lips, buccal mucosa, and tongue normal  EARS: (+) Hearing aids. NECK: supple, thyroid normal size, non-tender, without nodularity LYMPH:  no palpable lymphadenopathy in the cervical, axillary or inguinal LUNGS: clear to auscultation and percussion with normal breathing effort HEART: regular rate & rhythm and no murmurs and no lower extremity edema (+) atrial fibrillation  ABDOMEN:abdomen soft, non-tender and normal bowel sounds Musculoskeletal:no cyanosis of digits and no clubbing. (+) Ambulatory with a walker. NEURO: alert & oriented x 3 with fluent speech, no focal motor/sensory deficits  LABORATORY DATA:  I have reviewed the data as listed CBC Latest Ref Rng & Units 01/02/2017 12/25/2016 12/25/2016  WBC 3.9 - 10.3 10e3/uL 2.3(L) 2.6(L) 2.9(L)  Hemoglobin 11.6 - 15.9 g/dL 9.3(L) 10.2(L) 10.0(L)  Hematocrit 34.8 - 46.6 % 28.9(L) 30.3(L) 30.8(L)  Platelets 145 - 400 10e3/uL 712(H) 593(H) 592(H)   CMP Latest Ref Rng & Units 12/26/2016 12/25/2016 12/25/2016  Glucose 65 - 99 mg/dL 149(H) - 124(H)  BUN 6 - 20 mg/dL 17 - 23(H)  Creatinine 0.44 - 1.00 mg/dL 0.71 0.76 0.77  Sodium 135 - 145 mmol/L 134(L) - 138  Potassium 3.5 - 5.1 mmol/L 3.3(L) - 3.7  Chloride 101 - 111 mmol/L 102 - 106  CO2 22 - 32 mmol/L 22 - 23  Calcium 8.9 - 10.3 mg/dL 8.6(L) - 9.1  Total Protein 6.4 - 8.3 g/dL - - -  Total Bilirubin 0.20 - 1.20 mg/dL - - -  Alkaline Phos 40 - 150 U/L - - -  AST 5 - 34 U/L - - -  ALT 0 - 55 U/L - - -   PATHOLOGY:  11/14/2016   Diagnosis 11/07/2016 Bone Marrow, Aspirate,Biopsy, and Clot, left iliac crest - HYPERCELLULAR BONE MARROW FOR AGE WITH FEATURES OF MYELOID NEOPLASM. - SEE COMMENT. PERIPHERAL BLOOD: - MACROCYTIC ANEMIA. - LEUKOPENIA. - MARKED THROMBOCYTOSIS. Diagnosis Note The  sections show a hypercellular bone marrow for age with dyspoietic changes involving myeloid cell lines associated with prominent increase in megakaryocytes and peripheral thrombocytosis. Definite increase in blastic cells is not identified by morphology, immunohistochemistry or flow cytometric studies. The overall features are most compatible with a myelodysplastic/myeloproliferative neoplasm. Correlation with cytogenetic and FISH studies is recommended. (BNS:kh 11-08-16)  RADIOGRAPHIC STUDIES: I have personally reviewed the radiological images as listed and agreed with the findings in the report.  DG  Chest 2 View 12/25/2016 IMPRESSION: No active cardiopulmonary disease.  US Abdomen 11/23/2016 IMPRESSION: No acute abnormality of the liver or spleen. A 7 mm cyst in the right hepatic lobe is observed. No gallstones or sonographic evidence of acute cholecystitis. Bilateral renal cysts.  ASSESSMENT & PLAN:  83 y.o. female presents With worsening fatigue and dyspnea on exertion since November 2017, CBC reviewed severe neutropenia, moderate anemia, and significant thrombocytosis.  1. 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 Talbert Cage, I reviewed the lab results with her. -Her iron study, vitamin M75, folic acid levels are normal, however she does have elevated methylmalonic acid level, support mild B12 deficiency. I do not think her B12 deficiency can explain her abnormal CBC, especially thrombocytosis, however it may contribute to her severe fatigue. I recommend her to start B12 injection today and continue monthly. She does take B12 orally. -No lab evidence of hemolysis, haptoglobin was normal, LDH was normal -SPEP was negative for M protein -I reviewed her bone marrow biopsy with patient and her husband, which showed MDS and MPN, no evidence for myelofibrosis. -We previously reviewed the nature history of MDS and MPN, unfortunately  this is not curable disease, but is very treatable. -Her cytogenetics is normal, her MDS IPSS-R score 2.5, low risk, which predicts median survival of 5 years. Due to her MPN, her overall risk is probably little higher  -She has started Aranesp 1815mg every 2 weeks, responded well. -She was on Anagrelide 0.5 mg twice daily, tolerated well, thrombocytosis has improved, but still high overall. I increased her dose to 1 mg in the morning, and 1 mg in the evening starting 01/03/2017. PLT 712K today, will increase to 13mbid from tomorrow  -I encouraged her to continue aspirin, to reduce her risk of thrombosis. -Aranesp injection every 2 weeks if Hb<11  -I previously reviewed the abdominal [place]om 11/23/16 with the patient in detail. No splenomagely  -continue monitoring lab every 2 weeks -Labs reviewed, her anemia is improving   2.  Severe fatigue and dyspnea on exertion -Likely related to her moderate anemia, and underlying MDS/MPN -Improved after she started treatment, however her fatigue got much worse again lately  -She had an episode of syncope a few weeks ago, was hospitalized, and seen by cardiologist. Her echo showed normal EF. -I encouraged follow-up with her cardiologist tomorrow -I did vitamins working in the office and her oxygen level remains to be 94-98% -Due to her severe symptoms and high risk of thrombosis from her underline MDS/MPN, I'll obtain a CTA of the chest to ruled out PE this week   3. Mild B12 deficiency -She has normal E 12 level, however methylmalonic acid level was elevated significantly -I have start her on B12 injection monthly  4. Depression and Anxiety -Over diagnosis and not being able to do all of the activities she use to do  5. Insomnia -I suggested melatonin.   Plan -Continue anagrelide; increase to 2 tab (1.15m515min the morning and 2 in the evening. -Labs every 2 weeks -Aranesp injection every 2 weeks if Hb<11. -B12 injection monthly, next April  30. -Order CT angio chest to see if she has a PE or any other problems with her lungs. -I will call her daughter, [employer]en Circlet gives permission to discuss her case with her daughter.   -I will see her back 2 weeks.  -I will try to talk to her cardiologist after her visit  with them tomorrow   All questions were answered. The patient knows to call the clinic with any problems, questions or concerns. No barriers to learning was detected.  I spent 25 minutes counseling the patient face to face. The total time spent in the appointment was 30 minutes and more than 50% was on counseling and review of test results  This document serves as a record of services personally performed by Truitt Merle, MD. It was created on her behalf by Martinique Casey, a trained medical scribe. The creation of this record is based on the scribe's personal observations and the provider's statements to them. This document has been checked and approved by the attending provider.  I have reviewed the above documentation for accuracy and completeness and I agree with the above.    Truitt Merle, MD 01/02/2017

## 2017-01-02 NOTE — Telephone Encounter (Signed)
Gave patient AVS and calender per 4/16 los.  

## 2017-01-02 NOTE — Patient Instructions (Signed)

## 2017-01-04 ENCOUNTER — Encounter (HOSPITAL_COMMUNITY): Payer: Self-pay | Admitting: Emergency Medicine

## 2017-01-04 ENCOUNTER — Other Ambulatory Visit: Payer: Self-pay

## 2017-01-04 ENCOUNTER — Emergency Department (HOSPITAL_COMMUNITY): Payer: Medicare Other

## 2017-01-04 ENCOUNTER — Emergency Department (HOSPITAL_COMMUNITY)
Admission: EM | Admit: 2017-01-04 | Discharge: 2017-01-04 | Disposition: A | Discharge status: Home / Self Care | Payer: Medicare Other | Attending: Emergency Medicine | Admitting: Emergency Medicine

## 2017-01-04 ENCOUNTER — Ambulatory Visit (HOSPITAL_COMMUNITY): Admission: RE | Admit: 2017-01-04 | Payer: Medicare Other | Source: Ambulatory Visit

## 2017-01-04 DIAGNOSIS — R55 Syncope and collapse: Secondary | ICD-10-CM

## 2017-01-04 DIAGNOSIS — Z96651 Presence of right artificial knee joint: Secondary | ICD-10-CM | POA: Insufficient documentation

## 2017-01-04 DIAGNOSIS — R404 Transient alteration of awareness: Secondary | ICD-10-CM | POA: Diagnosis not present

## 2017-01-04 DIAGNOSIS — R079 Chest pain, unspecified: Secondary | ICD-10-CM | POA: Diagnosis not present

## 2017-01-04 DIAGNOSIS — I10 Essential (primary) hypertension: Secondary | ICD-10-CM | POA: Diagnosis not present

## 2017-01-04 DIAGNOSIS — Z7982 Long term (current) use of aspirin: Secondary | ICD-10-CM | POA: Insufficient documentation

## 2017-01-04 DIAGNOSIS — I2510 Atherosclerotic heart disease of native coronary artery without angina pectoris: Secondary | ICD-10-CM | POA: Diagnosis not present

## 2017-01-04 DIAGNOSIS — Z87891 Personal history of nicotine dependence: Secondary | ICD-10-CM | POA: Diagnosis not present

## 2017-01-04 DIAGNOSIS — Z79899 Other long term (current) drug therapy: Secondary | ICD-10-CM | POA: Diagnosis not present

## 2017-01-04 DIAGNOSIS — J449 Chronic obstructive pulmonary disease, unspecified: Secondary | ICD-10-CM | POA: Insufficient documentation

## 2017-01-04 HISTORY — DX: Syncope and collapse: R55

## 2017-01-04 LAB — CBC
HCT: 27.5 % — ABNORMAL LOW (ref 36.0–46.0)
HEMOGLOBIN: 8.9 g/dL — AB (ref 12.0–15.0)
MCH: 33.1 pg (ref 26.0–34.0)
MCHC: 32.4 g/dL (ref 30.0–36.0)
MCV: 102.2 fL — ABNORMAL HIGH (ref 78.0–100.0)
Platelets: 749 10*3/uL — ABNORMAL HIGH (ref 150–400)
RBC: 2.69 MIL/uL — AB (ref 3.87–5.11)
RDW: 16.1 % — ABNORMAL HIGH (ref 11.5–15.5)
WBC: 2.7 10*3/uL — ABNORMAL LOW (ref 4.0–10.5)

## 2017-01-04 LAB — URINALYSIS, ROUTINE W REFLEX MICROSCOPIC
BILIRUBIN URINE: NEGATIVE
Glucose, UA: NEGATIVE mg/dL
Hgb urine dipstick: NEGATIVE
KETONES UR: NEGATIVE mg/dL
LEUKOCYTES UA: NEGATIVE
Nitrite: POSITIVE — AB
PH: 5 (ref 5.0–8.0)
Protein, ur: NEGATIVE mg/dL
Specific Gravity, Urine: 1.015 (ref 1.005–1.030)

## 2017-01-04 LAB — BASIC METABOLIC PANEL
Anion gap: 8 (ref 5–15)
BUN: 31 mg/dL — ABNORMAL HIGH (ref 6–20)
CHLORIDE: 105 mmol/L (ref 101–111)
CO2: 23 mmol/L (ref 22–32)
Calcium: 8.8 mg/dL — ABNORMAL LOW (ref 8.9–10.3)
Creatinine, Ser: 1.01 mg/dL — ABNORMAL HIGH (ref 0.44–1.00)
GFR calc non Af Amer: 48 mL/min — ABNORMAL LOW (ref >60)
GFR, EST AFRICAN AMERICAN: 56 mL/min — AB (ref >60)
Glucose, Bld: 127 mg/dL — ABNORMAL HIGH (ref 65–99)
Potassium: 3.7 mmol/L (ref 3.5–5.1)
Sodium: 136 mmol/L (ref 135–145)

## 2017-01-04 LAB — CBG MONITORING, ED: Glucose-Capillary: 99 mg/dL (ref 65–99)

## 2017-01-04 MED ORDER — IOPAMIDOL (ISOVUE-370) INJECTION 76%
INTRAVENOUS | Status: AC
  Administered 2017-01-04: 100 mL via INTRAVENOUS
  Filled 2017-01-04: qty 100
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
## 2017-01-04 NOTE — ED Triage Notes (Addendum)
Per EMS, patient was at grocery store and felt like she was going to pass out & the front desk called 911. Denies fall/injury. Upon fire dept arrival, patient was confused, pale, and diaphoretic. Pt was scheduled for a "scan of her heart" today at [facility] at 14:45 due to previous syncopal episodes. Patient received 200 mL NS en route with EMS.

## 2017-01-04 NOTE — ED Notes (Signed)
Discharge instructions and follow up care reviewed with patient. Patient verbalized understanding. 

## 2017-01-04 NOTE — ED Notes (Signed)
Patient transported to X-ray 

## 2017-01-04 NOTE — Discharge Instructions (Signed)
As discussed, today's evaluation has been largely reassuring, but with your recurrent episodes of syncope (losing consciousness) it is very important that you follow-up with your physician as scheduled.  If you develop new, or concerning changes in the interim, please be sure to return here for further evaluation.

## 2017-01-05 ENCOUNTER — Telehealth: Payer: Self-pay | Admitting: Nurse Practitioner

## 2017-01-05 NOTE — Telephone Encounter (Signed)
Spoke with nurse from [facility], patient's bp is 130/58 and 148/62 with a heart rate of 73. Advised patient to take her bystolic this morning and take her diltiazem this evening. She was instructed to hold the diovan until seen on Monday.

## 2017-01-05 NOTE — Telephone Encounter (Signed)
Spoke with pt dtr, she is concerned about her mother passing out again and the need for the patient to be seen sooner than Thursday. Spoke with the patient, she reports feeling bad today. Yesterday prior to passing out she reports dizziness and weakness. She reports her bp was 45 by the EMT. The CNA that is currently with the patient is going to contact the nurse at [facility] to come down and check the patients bp. Advised the patient not to take diovan this morning. Patient rescheduled to see lori gerhardt np on Monday. The nurse is to call be back once she checks the patients vitals.

## 2017-01-05 NOTE — Telephone Encounter (Signed)
New message   Pt c/o Syncope: STAT if syncope occurred within 30 minutes and pt complains of lightheadedness High Priority if episode of passing out, completely, today or in last 24 hours   1. Did you pass out today? no  2. When is the last time you passed out? Yesterday   3. Has this occurred multiple times? Yes per daughter-twice  4. Did you have any symptoms prior to passing out? Per daughter pt bp drops and afib

## 2017-01-06 LAB — URINE CULTURE: Culture: 60000 — AB

## 2017-01-07 ENCOUNTER — Telehealth: Payer: Self-pay

## 2017-01-07 NOTE — Progress Notes (Signed)
ED Antimicrobial Stewardship Positive Culture Follow Up   Page Lori [name] is an 83 y.o. female who presented to [facility] on 01/04/2017 with a chief complaint of  Chief Complaint  Patient presents with  . Loss of Consciousness    Recent Results (from the past 720 hour(s))  Culture, Urine     Status: Abnormal   Collection Time: 12/26/16  6:31 PM  Result Value Ref Range Status   Specimen Description URINE, RANDOM  Final   Special Requests NONE  Final   Culture >=100,000 COLONIES/mL KLEBSIELLA PNEUMONIAE (A)  Final   Report Status 12/28/2016 FINAL  Final   Organism ID, Bacteria KLEBSIELLA PNEUMONIAE (A)  Final      Susceptibility   Klebsiella pneumoniae - MIC*    AMPICILLIN RESISTANT Resistant     CEFAZOLIN <=4 SENSITIVE Sensitive     CEFTRIAXONE <=1 SENSITIVE Sensitive     CIPROFLOXACIN <=0.25 SENSITIVE Sensitive     GENTAMICIN <=1 SENSITIVE Sensitive     IMIPENEM <=0.25 SENSITIVE Sensitive     NITROFURANTOIN 256 RESISTANT Resistant     TRIMETH/SULFA <=20 SENSITIVE Sensitive     AMPICILLIN/SULBACTAM 4 SENSITIVE Sensitive     PIP/TAZO <=4 SENSITIVE Sensitive     Extended ESBL NEGATIVE Sensitive     * >=100,000 COLONIES/mL KLEBSIELLA PNEUMONIAE  Urine culture     Status: Abnormal   Collection Time: 01/04/17  3:51 PM  Result Value Ref Range Status   Specimen Description URINE, RANDOM  Final   Special Requests NONE  Final   Culture 60,000 COLONIES/mL KLEBSIELLA PNEUMONIAE (A)  Final   Report Status 01/06/2017 FINAL  Final   Organism ID, Bacteria KLEBSIELLA PNEUMONIAE (A)  Final      Susceptibility   Klebsiella pneumoniae - MIC*    AMPICILLIN RESISTANT Resistant     CEFAZOLIN <=4 SENSITIVE Sensitive     CEFTRIAXONE <=1 SENSITIVE Sensitive     CIPROFLOXACIN 1 SENSITIVE Sensitive     GENTAMICIN <=1 SENSITIVE Sensitive     IMIPENEM <=0.25 SENSITIVE Sensitive     NITROFURANTOIN >=512 RESISTANT Resistant     TRIMETH/SULFA <=20 SENSITIVE Sensitive     AMPICILLIN/SULBACTAM 4  SENSITIVE Sensitive     PIP/TAZO 8 SENSITIVE Sensitive     Extended ESBL NEGATIVE Sensitive     * 60,000 COLONIES/mL KLEBSIELLA PNEUMONIAE    []  Treated with N/A, organism resistant to prescribed antimicrobial [x]  Patient discharged originally without antimicrobial agent and treatment is now indicated  New antibiotic prescription: cephalexin 500mg  PO BID x 7 days  ED Provider: Wyn Quaker, PA   [place], [name] 01/07/2017, 9:52 AM Clinical Pharmacist Phone# [phone]

## 2017-01-07 NOTE — Telephone Encounter (Signed)
Post ED Visit - Positive Culture Follow-up: Successful Patient Follow-Up  Culture assessed and recommendations reviewed by: []  Elenor Quinones, Pharm.D. []  Heide Guile, Pharm.D., BCPS AQ-ID []  Parks Neptune, Pharm.D., BCPS []  Alycia Rossetti, Pharm.D., BCPS []  [place], Pharm.D., BCPS, AAHIVP []  Legrand Como, Pharm.D., BCPS, AAHIVP [x]  Salome Arnt, PharmD, BCPS []  Dimitri Ped, PharmD, BCPS []  Vincenza Hews, PharmD, BCPS  Positive urine culture  [x]  Patient discharged without antimicrobial prescription and treatment is now indicated []  Organism is resistant to prescribed ED discharge antimicrobial []  Patient with positive blood cultures  Changes discussed with ED provider: Wyn Quaker [facility] New antibiotic prescription Keflex 500mg  BIC x 7 days Called to Maggie Font [phone]  Contacted patient, date  01/07/17, time 1432   Genia Del 01/07/2017, 2:31 PM

## 2017-01-08 ENCOUNTER — Telehealth: Payer: Self-pay | Admitting: Internal Medicine

## 2017-01-08 NOTE — Telephone Encounter (Signed)
PC from [place] from nurse supervisor at [facility] as patient of Dr Ronnald Ramp Recent hematologic cancer ---myelodysplasia? Now with respiratory difficulty Refusing any consideration of ER evaluation---has been there recently  Request for Rx and hospice evaluation Orders given for oxygen, duoneb 4/day prn and roxanol (0.25-0.44ml every hour prn) Will need to check in AM about hospice with primary care (not listed as Dr Ronnald Ramp in [facility])

## 2017-01-09 ENCOUNTER — Ambulatory Visit (INDEPENDENT_AMBULATORY_CARE_PROVIDER_SITE_OTHER): Payer: Medicare Other | Admitting: Nurse Practitioner

## 2017-01-09 ENCOUNTER — Telehealth: Payer: Self-pay | Admitting: Nurse Practitioner

## 2017-01-09 ENCOUNTER — Telehealth: Payer: Self-pay | Admitting: *Deleted

## 2017-01-09 ENCOUNTER — Encounter: Payer: Self-pay | Admitting: Nurse Practitioner

## 2017-01-09 VITALS — BP 88/60 | HR 72 | Ht 65.0 in

## 2017-01-09 DIAGNOSIS — I10 Essential (primary) hypertension: Secondary | ICD-10-CM

## 2017-01-09 NOTE — Addendum Note (Signed)
Addended by: Gaetano Net on: 01/09/2017 03:05 PM   Modules accepted: Orders

## 2017-01-09 NOTE — Telephone Encounter (Signed)
Spoke with daughter Kellie Simmering and advise no DPR in file to discuss pt's care.  Daughter states pt is too weak to come into office however she does not wants to cancel today's appt until she speaks with her father.  She will call back to cancel once they make a decision.  She also states they would like to discuss the need for Hospice and how to get it ordered.  Advised those orders usually come from the PCP or oncolologist.

## 2017-01-09 NOTE — Patient Instructions (Addendum)
We will be checking the following labs today - BMET, CBC, HEPATIC and BNP   Medication Instructions:    Continue with your current medicines. BUT  I am stopping the Diovan HCT    Testing/Procedures To Be Arranged:  N/A    Other Special Instructions:   N/A    If you need a refill on your cardiac medications before your next appointment, please call your pharmacy.   Call the [facility] office at [phone] if you have any questions, problems or concerns.

## 2017-01-09 NOTE — Telephone Encounter (Signed)
Received message from after hours nursing call that husband Gershon Mussel called to inquire about pt's f/u appts on Sunday 01/08/17.  Spoke with Gershon Mussel today and gave him pt's appts for 01/16/17.  Per Gershon Mussel, pt has been declined rapidly;  Tom did not think pt will make to next Monday's appts.  Gershon Mussel  Stated he is talking with Hospice services, and will let office know if any changes. Pt's    Phone    [phone].

## 2017-01-09 NOTE — Telephone Encounter (Signed)
New message    Pt daughter is calling to ask about hospice for pt. She said pt will not be able to make her appt today because she is not doing good today or over the weekend. She is asking for a call back from RN.
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
## 2017-01-10 ENCOUNTER — Other Ambulatory Visit: Payer: Self-pay | Admitting: *Deleted

## 2017-01-10 ENCOUNTER — Telehealth: Payer: Self-pay | Admitting: *Deleted

## 2017-01-10 ENCOUNTER — Non-Acute Institutional Stay (SKILLED_NURSING_FACILITY): Payer: Medicare Other | Admitting: Internal Medicine

## 2017-01-10 DIAGNOSIS — K582 Mixed irritable bowel syndrome: Secondary | ICD-10-CM

## 2017-01-10 DIAGNOSIS — R55 Syncope and collapse: Secondary | ICD-10-CM

## 2017-01-10 DIAGNOSIS — D469 Myelodysplastic syndrome, unspecified: Secondary | ICD-10-CM

## 2017-01-10 DIAGNOSIS — R9431 Abnormal electrocardiogram [ECG] [EKG]: Secondary | ICD-10-CM

## 2017-01-10 DIAGNOSIS — I471 Supraventricular tachycardia: Secondary | ICD-10-CM

## 2017-01-10 DIAGNOSIS — J449 Chronic obstructive pulmonary disease, unspecified: Secondary | ICD-10-CM

## 2017-01-10 DIAGNOSIS — I4719 Other supraventricular tachycardia: Secondary | ICD-10-CM

## 2017-01-10 LAB — CBC
Hematocrit: 27.9 % — ABNORMAL LOW (ref 34.0–46.6)
Hemoglobin: 9.4 g/dL — ABNORMAL LOW (ref 11.1–15.9)
MCH: 32.8 pg (ref 26.6–33.0)
MCHC: 33.7 g/dL (ref 31.5–35.7)
MCV: 97 fL (ref 79–97)
Platelets: 571 10*3/uL — ABNORMAL HIGH (ref 150–379)
RBC: 2.87 x10E6/uL — ABNORMAL LOW (ref 3.77–5.28)
RDW: 16.5 % — ABNORMAL HIGH (ref 12.3–15.4)
WBC: 3.2 10*3/uL — ABNORMAL LOW (ref 3.4–10.8)

## 2017-01-10 LAB — BASIC METABOLIC PANEL
BUN/Creatinine Ratio: 20 (ref 12–28)
BUN: 14 mg/dL (ref 8–27)
CO2: 23 mmol/L (ref 18–29)
Calcium: 8.4 mg/dL — ABNORMAL LOW (ref 8.7–10.3)
Chloride: 98 mmol/L (ref 96–106)
Creatinine, Ser: 0.69 mg/dL (ref 0.57–1.00)
GFR calc Af Amer: 90 mL/min/{1.73_m2} (ref >59)
GFR calc non Af Amer: 78 mL/min/{1.73_m2} (ref >59)
Glucose: 130 mg/dL — ABNORMAL HIGH (ref 65–99)
Potassium: 4.6 mmol/L (ref 3.5–5.2)
Sodium: 136 mmol/L (ref 134–144)

## 2017-01-10 LAB — HEPATIC FUNCTION PANEL
ALT: 12 IU/L (ref 0–32)
AST: 14 IU/L (ref 0–40)
Albumin: 3.8 g/dL (ref 3.5–4.7)
Alkaline Phosphatase: 69 IU/L (ref 39–117)
Bilirubin Total: 1.3 mg/dL — ABNORMAL HIGH (ref 0.0–1.2)
Bilirubin, Direct: 0.4 mg/dL (ref 0.00–0.40)
Total Protein: 5.9 g/dL — ABNORMAL LOW (ref 6.0–8.5)

## 2017-01-10 LAB — PRO B NATRIURETIC PEPTIDE: NT-Pro BNP: 5036 pg/mL — ABNORMAL HIGH (ref 0–738)

## 2017-01-10 MED ORDER — BUDESONIDE-FORMOTEROL FUMARATE 160-4.5 MCG/ACT IN AERO: 2.0000 | INHALATION_SPRAY | Freq: Two times a day (BID) | RESPIRATORY_TRACT | 2 refills | Status: DC

## 2017-01-10 MED ORDER — FUROSEMIDE 20 MG PO TABS: 20.0000 mg | ORAL_TABLET | Freq: Every day | ORAL | 9 refills | Status: DC

## 2017-01-10 MED ORDER — NEBIVOLOL HCL 2.5 MG PO TABS: 2.5000 mg | ORAL_TABLET | Freq: Every day | ORAL | 3 refills | Status: DC

## 2017-01-10 MED ORDER — DICYCLOMINE HCL 10 MG PO CAPS: 10.0000 mg | ORAL_CAPSULE | Freq: Three times a day (TID) | ORAL | 3 refills | Status: AC

## 2017-01-10 MED ORDER — BIMATOPROST 0.01 % OP SOLN: 1.0000 [drp] | Freq: Every day | OPHTHALMIC | 3 refills | Status: AC

## 2017-01-10 MED ORDER — ANAGRELIDE HCL 0.5 MG PO CAPS: ORAL_CAPSULE | ORAL | Status: DC

## 2017-01-10 NOTE — Progress Notes (Signed)
Provider:  Rexene Edison. Mariea Clonts, D.O., C.M.D. Location:  Artist of Service:  SNF (31)  PCP: Leanna Battles, MD Patient Care Team: Leanna Battles, MD as PCP - General (Internal Medicine) Brand Males, MD as Consulting Physician (Pulmonary Disease) [name], MD as Consulting Physician (Orthopedic Surgery)  Extended Emergency Contact Information Primary Emergency Contact: Jorge,Tom Address: [place], [place] [address] [name] of [place] Phone: [phone] Mobile Phone: [phone] Relation: Spouse Secondary Emergency Contact: Hendrix,[name]  [place] Mobile Phone: [phone] Relation: Daughter  Code Status: DNR Goals of Care: Advanced Directive information Advanced Directives 01/30/2017  Does Patient Have a Medical Advance Directive? Yes  Type of Advance Directive -  Does patient want to make changes to medical advance directive? -  Copy of [place] in Chart? Yes  Would patient like information on creating a medical advance directive? -  Pre-existing out of facility DNR order (yellow form or pink MOST form) -      Chief Complaint  Patient presents with  . New Admit To SNF    overall decline, weakness s/p syncopal episode, now in volume overload    HPI: Patient is a 83 y.o. female seen today for admission to [facility] rehab due to overall decline, weakness s/p syncopal episode (pending monitor) and now in volume overload.  She was referred to rehab by cardiology.  Her family felt she was so ill that she may require hospice.  When seen, she looked much better than expected.  She did not appear outright dyspneic, but expressed a generalized malaise and weakness.  She had slight edema of her lower extremities.  We did not yet have a weight here today, but she literally just arrived when I saw her.  She has been having more difficulty with her breathing and admits to more allergies  lately and rhinitis.  She has used symbicort for this in the past with benefit.  She is getting diuresed per cardiology recommendations.  Staff suggest oxygen as needed for comfort.  Pt wants some therapy for her weakness.  She says she cannot take care of herself at home or her husband and worries about his condition while she's here.  Past Medical History:  Diagnosis Date  . ALLERGIC RHINITIS   . Anxiety   . Asthma   . Atrial fibrillation ([place])   . Back pain of thoracolumbar region    right  . Bronchiectasis   . Colles' fracture of left radius   . COPD (chronic obstructive pulmonary disease) ([place])   . Coronary artery disease   . Diastolic dysfunction   . Diverticulosis   . Dysrhythmia   . History of pneumonia   . Hx of cardiovascular stress test    a. ETT-MV 1/14: Exercised 4:16, no ECG changes, poor ex tol, ant defect c/w soft tissue atten, no ischemia, EF 75%  . Hx of colonic polyp   . Hypertension   . IBS (irritable bowel syndrome)   . Insomnia   . Multiple rib fractures 10/2008   bilateral  . Pleural effusion, left   . Pruritus   . Pulmonary nodule   . Syncope    Past Surgical History:  Procedure Laterality Date  . ANKLE CLOSED REDUCTION Right 12/11/2014   Procedure: CLOSED REDUCTION ANKLE;  Surgeon: Rod Can, MD;  Location: [place];  Service: Orthopedics;  Laterality: Right;  . APPENDECTOMY    . BLADDER  SUSPENSION  2005   A-P  . CHOLECYSTECTOMY    . COLONOSCOPY  04/06/2012   Procedure: COLONOSCOPY;  Surgeon: Jerene Bears, MD;  Location: [facility];  Service: Gastroenterology;  Laterality: N/A;  . EXTERNAL FIXATION LEG Right 12/12/2014   Procedure: ADJUSTMENT OF EXTERNAL FIXATION  RIGHT ANKLE;  Surgeon: Rod Can, MD;  Location: [place];  Service: Orthopedics;  Laterality: Right;  . EXTERNAL FIXATION LEG Right 12/11/2014   Procedure: POSSIBLE EXTERNAL FIXATION RIGHT ANKLE;  Surgeon: Rod Can, MD;  Location: [place];  Service: Orthopedics;  Laterality: Right;    . HARDWARE REMOVAL Right 12/25/2014   Procedure: RIGHT ANKLE REMOVE OF DEEP IMPLANT ANE EXTERNAL FIXATOR ;  Surgeon: [name], MD;  Location: [place];  Service: Orthopedics;  Laterality: Right;  . ORIF ANKLE FRACTURE Right 12/25/2014   Procedure: OPEN REDUCTION INTERNAL FIXATION (ORIF)TRIMALLEOLAR  ANKLE FRACTURE;  Surgeon: [name], MD;  Location: [facility];  Service: Orthopedics;  Laterality: Right;  . ORIF WRIST FRACTURE Left 2010     Dr. Burney Gauze.  Marland Kitchen POLYPECTOMY    . ROTATOR CUFF REPAIR Right 2006  . TONSILLECTOMY    . TONSILLECTOMY    . TOTAL KNEE ARTHROPLASTY Right 08/2010    Social History   Social History  . Marital status: Married    Spouse name: N/A  . Number of children: 3  . Years of education: N/A   Occupational History  . retired Retired   Social History Main Topics  . Smoking status: Former Smoker    Packs/day: 0.30    Years: 10.00    Types: Cigarettes    Quit date: 09/19/1948  . Smokeless tobacco: Never Used  . Alcohol use No     Comment: rarely  . Drug use: No  . Sexual activity: No   Other Topics Concern  . Not on file   Social History Narrative   [place]; [place]. Married 1959 - spouse s/p valve surgery with resulting paralysis hemidiaphragm ('10). 3 daughter, 5 grandsons, 4 granddaughters.  1 daughter bipolar - social issues. She remains very active, but can't play tennis. Pt was apprently ex-smoker, but husband does not recollect pt ever smoking. Stressful move to smaller quarters (fall '09) and now moving to [facility] (fall '12).      ACP - she clearly states No CPR, no mechanical ventilation and that quality of life is of key importance. Provided MOST sample, directed to [email or web address] and provided an Out of Facility Order. (Jan '14)    reports that she quit smoking about 68 years ago. Her smoking use included Cigarettes. She has a 3.00 pack-year smoking history. She has never  used smokeless tobacco. She reports that she does not drink alcohol or use drugs.  Functional Status Survey:    Family History  Problem Relation Age of Onset  . Heart disease Mother   . Rheumatic fever Mother   . Bipolar disorder Daughter   . Pneumonia Father   . Colon cancer Neg Hx     Health Maintenance  Topic Date Due  . DEXA SCAN  03/15/1993  . INFLUENZA VACCINE  04/19/2017  . TETANUS/TDAP  08/21/2023  . PNA vac Low Risk Adult  Completed    Allergies  Allergen Reactions  . Albuterol Sulfate Palpitations  . Levaquin [Levofloxacin In D5w] Other (See Comments)    QT prolongation. Should avoid all flouroquinolones.   . Doxycycline Diarrhea  . Lactose Intolerance (Gi)  Other (See Comments)  . Amoxicillin Other (See Comments)    REACTION: unspecified  . Codeine Other (See Comments)    Can take Hydrocodone  . Penicillins Hives, Itching and Rash    Hard lump and rash Has patient had a PCN reaction causing immediate rash, facial/tongue/throat swelling, SOB or lightheadedness with hypotension: Yes Has patient had a PCN reaction causing severe rash involving mucus membranes or skin necrosis: No Has patient had a PCN reaction that required hospitalization unknown Has patient had a PCN reaction occurring within the last 10 years: No If all of the above answers are "NO", then may proceed with Cephalosporin use.  . Sulfa Antibiotics Nausea Only  . Sulfonamide Derivatives Nausea Only    Allergies as of 01/10/2017      Reactions   Albuterol Sulfate Palpitations   Levaquin [levofloxacin In D5w] Other (See Comments)   QT prolongation. Should avoid all flouroquinolones.    Doxycycline Diarrhea   Lactose Intolerance (gi) Other (See Comments)   Amoxicillin Other (See Comments)   REACTION: unspecified   Codeine Other (See Comments)   Can take Hydrocodone   Penicillins Hives, Itching, Rash   Hard lump and rash Has patient had a PCN reaction causing immediate rash,  facial/tongue/throat swelling, SOB or lightheadedness with hypotension: Yes Has patient had a PCN reaction causing severe rash involving mucus membranes or skin necrosis: No Has patient had a PCN reaction that required hospitalization unknown Has patient had a PCN reaction occurring within the last 10 years: No If all of the above answers are "NO", then may proceed with Cephalosporin use.   Sulfa Antibiotics Nausea Only   Sulfonamide Derivatives Nausea Only      Medication List       Accurate as of 01/10/17 11:59 PM. Always use your most recent med list.          acetaminophen 500 MG tablet Commonly known as:  TYLENOL Take 1,000 mg by mouth every 6 (six) hours as needed for headache (pain). Not to exceed 3000mg  daily   anagrelide 0.5 MG capsule Commonly known as:  AGRYLIN Take 2 tablets (1 mg) by mouth every morning and 1 tablet (0.5 mg) at night   aspirin EC 81 MG tablet Take 1 tablet (81 mg total) by mouth daily.   bimatoprost 0.01 % Soln Commonly known as:  LUMIGAN Place 1 drop into both eyes at bedtime.   budesonide-formoterol 160-4.5 MCG/ACT inhaler Commonly known as:  SYMBICORT Inhale 2 puffs into the lungs 2 (two) times daily.   cephALEXin 500 MG capsule Commonly known as:  KEFLEX Take 1 capsule (500 mg total) by mouth 2 (two) times daily.   clindamycin 300 MG capsule Commonly known as:  CLEOCIN Take 600 mg by mouth See admin instructions. Take 2 capsules (600 mg) by mouth one hour prior to dental procedures   clonazePAM 0.5 MG tablet Commonly known as:  KLONOPIN Take 0.5 tablets (0.25 mg total) by mouth 3 (three) times daily as needed (anxiety).   dicyclomine 10 MG capsule Commonly known as:  BENTYL Take 1 capsule (10 mg total) by mouth 3 (three) times daily before meals. IBS   diltiazem 240 MG 24 hr capsule Commonly known as:  CARDIZEM CD Take 1 capsule (240 mg total) by mouth daily.   furosemide 20 MG tablet Commonly known as:  LASIX Take 1 tablet  (20 mg total) by mouth daily. Take (40 mg ) for 3 days starting 4/24 - 426 than (20 mg ) daily  loperamide 2 MG capsule Commonly known as:  IMODIUM Take 2 mg by mouth as needed for diarrhea or loose stools.   magnesium oxide 400 MG tablet Commonly known as:  MAG-OX Take 400 mg by mouth daily as needed (leg cramps).   multivitamin with minerals Tabs tablet Take 1 tablet by mouth daily.   nebivolol 2.5 MG tablet Commonly known as:  BYSTOLIC Take 1 tablet (2.5 mg total) by mouth daily.   zolpidem 5 MG tablet Commonly known as:  AMBIEN Take 1 tablet (5 mg total) by mouth at bedtime as needed for sleep.       Review of Systems  Constitutional: Positive for malaise/fatigue. Negative for chills and fever.  HENT: Positive for congestion and hearing loss.   Eyes: Negative for blurred vision.  Respiratory: Positive for shortness of breath and wheezing. Negative for cough and sputum production.   Cardiovascular: Positive for leg swelling. Negative for chest pain and palpitations.  Gastrointestinal: Positive for heartburn. Negative for abdominal pain.       Has IBS with diarrhea and constipation, gets abd bloating  Genitourinary: Negative for dysuria.  Musculoskeletal: Positive for falls and joint pain.  Skin: Negative for itching and rash.  Neurological: Positive for loss of consciousness and weakness. Negative for dizziness.  Psychiatric/Behavioral: Negative for depression. The patient is nervous/anxious and has insomnia.     There were no vitals filed for this visit. There is no height or weight on file to calculate BMI. Physical Exam  Constitutional: She is oriented to person, place, and time. She appears well-developed and well-nourished. No distress.  HENT:  Head: Normocephalic and atraumatic.  Right Ear: External ear normal.  Left Ear: External ear normal.  Nose: Nose normal.  Mouth/Throat: Oropharynx is clear and moist. No oropharyngeal exudate.  Eyes: EOM are normal.  Pupils are equal, round, and reactive to light.  Neck: Neck supple. No JVD present.  Cardiovascular: Normal rate, regular rhythm, normal heart sounds and intact distal pulses.   Pulmonary/Chest: Effort normal. No respiratory distress. She has wheezes.  Abdominal: Soft. Bowel sounds are normal. She exhibits no distension. There is no tenderness.  Musculoskeletal: Normal range of motion.  Lymphadenopathy:    She has no cervical adenopathy.  Neurological: She is alert and oriented to person, place, and time.  Skin: Capillary refill takes less than 2 seconds. There is pallor.  Psychiatric:  anxious    Labs reviewed: Basic Metabolic Panel:  Recent Labs  12/25/16 1550 12/26/16 1306 01/04/17 1301 01/09/17 1522 01/16/17 1136  NA 138 134* 136 136 141  K 3.7 3.3* 3.7 4.6 3.6  CL 106 102 105 98  --   CO2 23 22 23 23 22   GLUCOSE 124* 149* 127* 130* 148*  BUN 23* 17 31* 14 18.5  CREATININE 0.76  0.77 0.71 1.01* 0.69 0.8  CALCIUM 9.1 8.6* 8.8* 8.4* 9.2  MG 2.1  --   --   --   --    Liver Function Tests:  Recent Labs  11/11/16 1015 01/09/17 1522 01/16/17 1136  AST 11 14 15   ALT 12 12 17   ALKPHOS 80 69 74  BILITOT 0.61 1.3* 0.68  PROT 6.6 5.9* 6.4  ALBUMIN 4.1 3.8 3.7   No results for input(s): LIPASE, AMYLASE in the last 8760 hours. No results for input(s): AMMONIA in the last 8760 hours. CBC:  Recent Labs  01/02/17 1454 01/04/17 1301 01/09/17 1522 01/16/17 1136 01/30/17 1113  WBC 2.3* 2.7* 3.2* 3.0* 4.5  NEUTROABS 1.1*  --   --  1.9 3.0  HGB 9.3* 8.9*  --  9.4* 9.1*  HCT 28.9* 27.5* 27.9* 29.7* 28.9*  MCV 102.1* 102.2* 97 101.0 100.3  PLT 712* 749* 571* 820* 749*   Cardiac Enzymes:  Recent Labs  12/25/16 1900 12/26/16 0605  TROPONINI <0.03 <0.03   BNP: Invalid input(s): POCBNP Lab Results  Component Value Date   HGBA1C (H) 10/04/2010    5.7 (NOTE)                                                                       According to the ADA Clinical  Practice Recommendations for 2011, when HbA1c is used as a screening test:   >=6.5%   Diagnostic of Diabetes Mellitus           (if abnormal result  is confirmed)  5.7-6.4%   Increased risk of developing Diabetes Mellitus  References:Diagnosis and Classification of Diabetes Mellitus,Diabetes UXLK,4401,02(VOZDG 1):S62-S69 and Standards of Medical Care in         Diabetes - 2011,Diabetes Care,2011,34  (Suppl 1):S11-S61.   Lab Results  Component Value Date   TSH 2.131 12/25/2016   Lab Results  Component Value Date   UYQIHKVQ25 956 10/11/2016   Lab Results  Component Value Date   FOLATE 16.2 04/04/2012   Lab Results  Component Value Date   IRON 114 10/11/2016   TIBC 266 10/11/2016   FERRITIN 252 10/11/2016    Imaging and Procedures obtained prior to SNF admission: Dg Chest 2 View  Result Date: 01/04/2017 CLINICAL DATA:  Syncopal episode sedated grocery store. EXAM: CHEST  2 VIEW COMPARISON:  12/25/2016 FINDINGS: Normal heart size and stable mediastinal contours. No definite aortic valve region calcification. No aortic valve calcification on 2015 chest CT. There is no edema, consolidation, effusion, or pneumothorax. Spondylosis. Right rotator cuff repair with high humeral head. IMPRESSION: No evidence of active disease. Electronically Signed   By: Monte Fantasia M.D.   On: 01/04/2017 14:13   Ct Angio Chest Pe W And/or Wo Contrast  Result Date: 01/04/2017 CLINICAL DATA:  Recurrent syncope. Chest pain. Myelodysplastic syndrome. EXAM: CT ANGIOGRAPHY CHEST WITH CONTRAST TECHNIQUE: Multidetector CT imaging of the chest was performed using the standard protocol during bolus administration of intravenous contrast. Multiplanar CT image reconstructions and MIPs were obtained to evaluate the vascular anatomy. CONTRAST:  100 mL Isovue 370 COMPARISON:  Chest CTA 02/06/2014.  Chest radiographs earlier today. FINDINGS: Cardiovascular: Pulmonary arterial opacification is excellent. There is no evidence of  segmental or more proximal pulmonary emboli. Motion artifact predominantly limits subsegmental assessment. Mild thoracic aortic atherosclerosis is noted without aneurysm. Coronary artery atherosclerosis is noted. The heart is normal in size. There is no pericardial effusion. Mediastinum/Nodes: No enlarged axillary, mediastinal, or hilar lymph nodes are identified. Scattered subcentimeter mediastinal lymph nodes are slightly larger than in 2015, with the largest measuring 9 mm in short axis in the lower right paratracheal station. Lungs/Pleura: No pleural effusion or pneumothorax. There is mild respiratory motion artifact with mild atelectasis in both lung bases. A 3 mm nodule in the superior segment of the left lower lobe appear slightly larger than in 2015 (series 11, image 29). A 2 mm nodule more centrally in the left lower lobe is unchanged (series 11, image  40). Additional 3 mm left lower lobe nodule is new (series 11, image 45). A 2 mm left upper lobe subpleural nodule is unchanged (series 11, image 24). Upper Abdomen: Unremarkable. Musculoskeletal: Partially visualized suture anchors in the right humeral head. Spondylosis and mild dextroscoliosis of the thoracic spine. Review of the MIP images confirms the above findings. IMPRESSION: 1. No evidence of pulmonary emboli or other acute abnormality in the chest. 2. Scattered small lung nodules measuring up to 3 mm in size including one new left lower lobe nodule, most likely benign. 3.  Aortic Atherosclerosis (ICD10-I70.0). Electronically Signed   By: Logan Bores M.D.   On: 01/04/2017 15:57    Assessment/Plan 1. Multifocal atrial tachycardia (HCC) - cont current regimen, await further input from cardiology, cont diuresis as planned -treat dyspnea with prn oxygen and restart her symbicort - nebivolol (BYSTOLIC) 2.5 MG tablet; Take 1 tablet (2.5 mg total) by mouth daily.  Dispense: 30 tablet; Refill: 3  2. MDS/MPN (myelodysplastic/myeloproliferative  neoplasms) ([place]) -ongoing, follows with cardiology, could contribute to her fatigue   3. Irritable bowel syndrome with both constipation and diarrhea - is on bentyl which is anticholinergic, but prior attempts in rehab to change her meds have not been a success despite beer's list - dicyclomine (BENTYL) 10 MG capsule; Take 1 capsule (10 mg total) by mouth 3 (three) times daily before meals. IBS  Dispense: 90 capsule; Refill: 3  4. Syncope, unspecified syncope type -not entirely clear if this is due to MAT, prolonged QT, etc -await completion of cardiology w/u -monitor vitals closely here  5. Chronic obstructive pulmonary disease, unspecified COPD type ([place]) -add symbicort back to regimen which helped with dyspnea before by her report  6. Prolonged QT interval -noted, will be careful with med choices to prevent arrhythmias, also f/u labs with bmp and BNP 5/1  Family/ staff Communication: discussed with nursing staff, reviewed cardiology, primary care and hematology notes  Labs/tests ordered: bmp, bnp 5/1  Nakina Spatz L. Jeffrey Graefe, D.O. [facility] [address],  [address] Cell Phone (Mon-Fri 8am-5pm):  [phone] On Call:  [phone] & follow prompts after 5pm & weekends Office Phone:  [phone] Office Fax:  [phone]

## 2017-01-10 NOTE — Telephone Encounter (Signed)
Patient was seen at her scheduled appointment with Truitt Merle, NP yesterday.

## 2017-01-10 NOTE — Telephone Encounter (Signed)
I think hospice is appropriate, please follow up tomorrow to see they she needs referral.  Thanks.   Truitt Merle MD

## 2017-01-10 NOTE — Telephone Encounter (Signed)
[facility] called, Maudie Mercury @ [phone] to see Why pt is being seen in therapy today.  DX: Failure to thrive, needs to go to Assisted Living.  All paperwork was faxed over this am.  Re faxed order for lasix today.

## 2017-01-11 ENCOUNTER — Ambulatory Visit: Payer: Medicare Other | Admitting: Nurse Practitioner

## 2017-01-12 NOTE — Telephone Encounter (Signed)
Spoke with husband Gershon Mussel, and was informed that pt saw her cardiologist 2 days ago.  Per Gershon Mussel, cardiologist DID NOT Want pt to be under hospice care yet.  Instead, pt was referred to rehab at [facility]. Tom confirmed pt's appts for Mon 01/16/17, and stated they would be coming to appts.

## 2017-01-14 NOTE — Progress Notes (Signed)
Clarification Pt has a history of Grade 2 diastolic dysfunction.  CHF diagnosis should be Diastolic dysfunction

## 2017-01-16 ENCOUNTER — Encounter: Payer: Self-pay | Admitting: Hematology

## 2017-01-16 ENCOUNTER — Other Ambulatory Visit: Payer: Medicare Other

## 2017-01-16 ENCOUNTER — Other Ambulatory Visit (HOSPITAL_BASED_OUTPATIENT_CLINIC_OR_DEPARTMENT_OTHER): Payer: Medicare Other

## 2017-01-16 ENCOUNTER — Ambulatory Visit: Payer: Medicare Other

## 2017-01-16 ENCOUNTER — Ambulatory Visit (HOSPITAL_BASED_OUTPATIENT_CLINIC_OR_DEPARTMENT_OTHER): Payer: Medicare Other | Admitting: Hematology

## 2017-01-16 ENCOUNTER — Ambulatory Visit (HOSPITAL_BASED_OUTPATIENT_CLINIC_OR_DEPARTMENT_OTHER): Payer: Medicare Other

## 2017-01-16 VITALS — BP 132/48 | HR 87 | Resp 18 | Ht 65.0 in | Wt 133.0 lb

## 2017-01-16 VITALS — BP 132/48 | HR 87

## 2017-01-16 DIAGNOSIS — D519 Vitamin B12 deficiency anemia, unspecified: Secondary | ICD-10-CM | POA: Diagnosis not present

## 2017-01-16 DIAGNOSIS — D469 Myelodysplastic syndrome, unspecified: Secondary | ICD-10-CM | POA: Diagnosis not present

## 2017-01-16 DIAGNOSIS — F329 Major depressive disorder, single episode, unspecified: Secondary | ICD-10-CM

## 2017-01-16 DIAGNOSIS — D473 Essential (hemorrhagic) thrombocythemia: Secondary | ICD-10-CM | POA: Diagnosis not present

## 2017-01-16 DIAGNOSIS — C946 Myelodysplastic disease, not classified: Secondary | ICD-10-CM

## 2017-01-16 DIAGNOSIS — R5383 Other fatigue: Secondary | ICD-10-CM | POA: Diagnosis not present

## 2017-01-16 DIAGNOSIS — F419 Anxiety disorder, unspecified: Secondary | ICD-10-CM

## 2017-01-16 DIAGNOSIS — D649 Anemia, unspecified: Secondary | ICD-10-CM

## 2017-01-16 DIAGNOSIS — D61818 Other pancytopenia: Secondary | ICD-10-CM

## 2017-01-16 DIAGNOSIS — G4700 Insomnia, unspecified: Secondary | ICD-10-CM

## 2017-01-16 DIAGNOSIS — Z7189 Other specified counseling: Secondary | ICD-10-CM

## 2017-01-16 LAB — CBC & DIFF AND RETIC
BASO%: 0.3 % (ref 0.0–2.0)
BASOS ABS: 0 10*3/uL (ref 0.0–0.1)
EOS ABS: 0 10*3/uL (ref 0.0–0.5)
EOS%: 0.3 % (ref 0.0–7.0)
HEMATOCRIT: 29.7 % — AB (ref 34.8–46.6)
HEMOGLOBIN: 9.4 g/dL — AB (ref 11.6–15.9)
IMMATURE RETIC FRACT: 8.4 % (ref 1.60–10.00)
LYMPH%: 34.2 % (ref 14.0–49.7)
MCH: 32 pg (ref 25.1–34.0)
MCHC: 31.6 g/dL (ref 31.5–36.0)
MCV: 101 fL (ref 79.5–101.0)
MONO#: 0.1 10*3/uL (ref 0.1–0.9)
MONO%: 3 % (ref 0.0–14.0)
NEUT#: 1.9 10*3/uL (ref 1.5–6.5)
NEUT%: 62.2 % (ref 38.4–76.8)
PLATELETS: 820 10*3/uL — AB (ref 145–400)
RBC: 2.94 10*6/uL — ABNORMAL LOW (ref 3.70–5.45)
RDW: 15.7 % — ABNORMAL HIGH (ref 11.2–14.5)
RETIC CT ABS: 79.09 10*3/uL (ref 33.70–90.70)
Retic %: 2.69 % — ABNORMAL HIGH (ref 0.70–2.10)
WBC: 3 10*3/uL — AB (ref 3.9–10.3)
lymph#: 1 10*3/uL (ref 0.9–3.3)
nRBC: 3 % — ABNORMAL HIGH (ref 0–0)

## 2017-01-16 LAB — COMPREHENSIVE METABOLIC PANEL
ALBUMIN: 3.7 g/dL (ref 3.5–5.0)
ALK PHOS: 74 U/L (ref 40–150)
ALT: 17 U/L (ref 0–55)
AST: 15 U/L (ref 5–34)
Anion Gap: 13 mEq/L — ABNORMAL HIGH (ref 3–11)
BUN: 18.5 mg/dL (ref 7.0–26.0)
CALCIUM: 9.2 mg/dL (ref 8.4–10.4)
CO2: 22 mEq/L (ref 22–29)
CREATININE: 0.8 mg/dL (ref 0.6–1.1)
Chloride: 106 mEq/L (ref 98–109)
EGFR: 66 mL/min/{1.73_m2} — AB (ref >90)
Glucose: 148 mg/dl — ABNORMAL HIGH (ref 70–140)
Potassium: 3.6 mEq/L (ref 3.5–5.1)
Sodium: 141 mEq/L (ref 136–145)
TOTAL PROTEIN: 6.4 g/dL (ref 6.4–8.3)
Total Bilirubin: 0.68 mg/dL (ref 0.20–1.20)

## 2017-01-16 MED ORDER — DARBEPOETIN ALFA 150 MCG/0.3ML IJ SOSY
150.0000 ug | PREFILLED_SYRINGE | Freq: Once | INTRAMUSCULAR | Status: AC
  Administered 2017-01-16: 150 ug via SUBCUTANEOUS
  Filled 2017-01-16: qty 0.3

## 2017-01-16 MED ORDER — CYANOCOBALAMIN 1000 MCG/ML IJ SOLN
1000.0000 ug | Freq: Once | INTRAMUSCULAR | Status: AC
  Administered 2017-01-16: 1000 ug via INTRAMUSCULAR

## 2017-01-16 MED ORDER — CYANOCOBALAMIN 1000 MCG/ML IJ SOLN
INTRAMUSCULAR | Status: AC
  Filled 2017-01-16: qty 1

## 2017-01-16 NOTE — Patient Instructions (Signed)
Darbepoetin Alfa injection What is this medicine? DARBEPOETIN ALFA (dar be POE e tin AL fa) helps your body make more red blood cells. It is used to treat anemia caused by chronic kidney failure and chemotherapy. This medicine may be used for other purposes; ask your health care provider or pharmacist if you have questions. COMMON BRAND NAME(S): Aranesp What should I tell my health care provider before I take this medicine? They need to know if you have any of these conditions: -blood clotting disorders or history of blood clots -cancer patient not on chemotherapy -cystic fibrosis -heart disease, such as angina, heart failure, or a history of a heart attack -hemoglobin level of 12 g/dL or greater -high blood pressure -low levels of folate, iron, or vitamin B12 -seizures -an unusual or allergic reaction to darbepoetin, erythropoietin, albumin, hamster proteins, latex, other medicines, foods, dyes, or preservatives -pregnant or trying to get pregnant -breast-feeding How should I use this medicine? This medicine is for injection into a vein or under the skin. It is usually given by a health care professional in a hospital or clinic setting. If you get this medicine at home, you will be taught how to prepare and give this medicine. Use exactly as directed. Take your medicine at regular intervals. Do not take your medicine more often than directed. It is important that you put your used needles and syringes in a special sharps container. Do not put them in a trash can. If you do not have a sharps container, call your pharmacist or healthcare provider to get one. A special MedGuide will be given to you by the pharmacist with each prescription and refill. Be sure to read this information carefully each time. Talk to your pediatrician regarding the use of this medicine in children. While this medicine may be used in children as young as 1 year for selected conditions, precautions do  apply. Overdosage: If you think you have taken too much of this medicine contact a poison control center or emergency room at once. NOTE: This medicine is only for you. Do not share this medicine with others. What if I miss a dose? If you miss a dose, take it as soon as you can. If it is almost time for your next dose, take only that dose. Do not take double or extra doses. What may interact with this medicine? Do not take this medicine with any of the following medications: -epoetin alfa This list may not describe all possible interactions. Give your health care provider a list of all the medicines, herbs, non-prescription drugs, or dietary supplements you use. Also tell them if you smoke, drink alcohol, or use illegal drugs. Some items may interact with your medicine. What should I watch for while using this medicine? Your condition will be monitored carefully while you are receiving this medicine. You may need blood work done while you are taking this medicine. What side effects may I notice from receiving this medicine? Side effects that you should report to your doctor or health care professional as soon as possible: -allergic reactions like skin rash, itching or hives, swelling of the face, lips, or tongue -breathing problems -changes in vision -chest pain -confusion, trouble speaking or understanding -feeling faint or lightheaded, falls -high blood pressure -muscle aches or pains -pain, swelling, warmth in the leg -rapid weight gain -severe headaches -sudden numbness or weakness of the face, arm or leg -trouble walking, dizziness, loss of balance or coordination -seizures (convulsions) -swelling of the ankles, feet, hands -  unusually weak or tired Side effects that usually do not require medical attention (report to your doctor or health care professional if they continue or are bothersome): -diarrhea -fever, chills (flu-like symptoms) -headaches -nausea, vomiting -redness,  stinging, or swelling at site where injected This list may not describe all possible side effects. Call your doctor for medical advice about side effects. You may report side effects to FDA at 1-800-FDA-1088. Where should I keep my medicine? Keep out of the reach of children. Store in a refrigerator between 2 and 8 degrees C (36 and 46 degrees F). Do not freeze. Do not shake. Throw away any unused portion if using a single-dose vial. Throw away any unused medicine after the expiration date. NOTE: This sheet is a summary. It may not cover all possible information. If you have questions about this medicine, talk to your doctor, pharmacist, or health care provider.  2018 [employer] (2016-04-25 19:52:26) Cyanocobalamin, Vitamin B12 injection What is this medicine? CYANOCOBALAMIN (sye an oh koe BAL a min) is a man made form of vitamin B12. Vitamin B12 is used in the growth of healthy blood cells, nerve cells, and proteins in the body. It also helps with the metabolism of fats and carbohydrates. This medicine is used to treat people who can not absorb vitamin B12. This medicine may be used for other purposes; ask your health care provider or pharmacist if you have questions. COMMON BRAND NAME(S): B-12 Compliance Kit, B-12 Injection Kit, Cyomin, LA-12, Nutri-Twelve, Physicians EZ Use B-12, Primabalt What should I tell my health care provider before I take this medicine? They need to know if you have any of these conditions: -kidney disease -Leber's disease -megaloblastic anemia -an unusual or allergic reaction to cyanocobalamin, cobalt, other medicines, foods, dyes, or preservatives -pregnant or trying to get pregnant -breast-feeding How should I use this medicine? This medicine is injected into a muscle or deeply under the skin. It is usually given by a health care professional in a clinic or doctor's office. However, your doctor may teach you how to inject yourself. Follow all  instructions. Talk to your pediatrician regarding the use of this medicine in children. Special care may be needed. Overdosage: If you think you have taken too much of this medicine contact a poison control center or emergency room at once. NOTE: This medicine is only for you. Do not share this medicine with others. What if I miss a dose? If you are given your dose at a clinic or doctor's office, call to reschedule your appointment. If you give your own injections and you miss a dose, take it as soon as you can. If it is almost time for your next dose, take only that dose. Do not take double or extra doses. What may interact with this medicine? -colchicine -heavy alcohol intake This list may not describe all possible interactions. Give your health care provider a list of all the medicines, herbs, non-prescription drugs, or dietary supplements you use. Also tell them if you smoke, drink alcohol, or use illegal drugs. Some items may interact with your medicine. What should I watch for while using this medicine? Visit your doctor or health care professional regularly. You may need blood work done while you are taking this medicine. You may need to follow a special diet. Talk to your doctor. Limit your alcohol intake and avoid smoking to get the best benefit. What side effects may I notice from receiving this medicine? Side effects that you should report to your doctor   or health care professional as soon as possible: -allergic reactions like skin rash, itching or hives, swelling of the face, lips, or tongue -blue tint to skin -chest tightness, pain -difficulty breathing, wheezing -dizziness -red, swollen painful area on the leg Side effects that usually do not require medical attention (report to your doctor or health care professional if they continue or are bothersome): -diarrhea -headache This list may not describe all possible side effects. Call your doctor for medical advice about side  effects. You may report side effects to FDA at 1-800-FDA-1088. Where should I keep my medicine? Keep out of the reach of children. Store at room temperature between 15 and 30 degrees C (59 and 85 degrees F). Protect from light. Throw away any unused medicine after the expiration date. NOTE: This sheet is a summary. It may not cover all possible information. If you have questions about this medicine, talk to your doctor, pharmacist, or health care provider.  2018 [employer] Standard (2007-12-17 22:10:20)  

## 2017-01-16 NOTE — Progress Notes (Signed)
[place]  [phone] Fax:([phone]) [phone]  Clinic Follow up Note   Patient Care Team: Leanna Battles, MD as PCP - General (Internal Medicine) Brand Males, MD as Consulting Physician (Pulmonary Disease) [name], MD as Consulting Physician (Orthopedic Surgery) 01/16/2017  CHIEF COMPLAIN: follow up MDS/MPN   HISTORY OF PRESENT ILLNESS (From Dr. Laverle Patter note on 10/11/2016): Lori [name] 83 y.o. female is here for evaluation of leukopenia. Patient states that she's been having severe fatigue since November 2017. She was having trouble with her atrial fibrillation again, however this has improved. CBC from 12/617 demonstrated WBC 4.83K, hemoglobin 10.9 g/dL, hematocrit 31.8%, MCV 105.4, place a count 764K. CBC from 04/11/16 demonstrated WBC 4.1K, hemoglobin 12.5 g/dL, hematocrit 38.1%, platelet count 693K. Most recent CBC from 09/28/16 demonstrated worsening of her leukopenia and anemia with WBC 2K, hemoglobin 9.6 g/dL, hematocrit 28.5%, MCV 107, platelet count 748K. She also has diarrhea about 3-4 times a day, however she states that this is been a lifelong problem for her. She has lost about 5 pounds in the past 2 months. She also has occasional shortness of breath. She denies any recent changes in her medications.  CURRENT THERAPY:  1. Aranesp 132mg every 2 weeks, started on 11/18/2016 2. Anagrelide 0.573mBID started on 11/11/2016, increased to 1.44m88mn am and 0.5mg42m pm on 12/06/2016, and 1mg 6m on 01/16/2017   INTERVAL HISTORY: She returns for follow up. She reports her husband is not doing well, and can no longer accompany her to her appointments. She also reports she has told her daughters not to come to appointments anymore as she is concerned about placing hardship on them. She states she is doing poorly. The patient reports she has extra help around the house several days a weeks; she can take care of herself on the other days, though she has difficulty  cooking. She reports "I just don't understand what is happening to me." She reports episodes of syncope, and questions why this is happening.   REVIEW OF SYSTEMS:   Constitutional: Denies fevers, chills or abnormal weight loss  Eyes: Denies blurriness of vision Ears, nose, mouth, throat, and face: Denies mucositis or sore throat (+) hearing loss Respiratory: Denies cough, dyspnea or wheezes (+) SOB Cardiovascular: Denies palpitation, chest discomfort or lower extremity swelling (+) chest tightness Gastrointestinal:  Denies nausea, heartburn or change in bowel habits (+) nausea (+) diarrhea (+) vomiting Skin: Denies abnormal skin rashes Lymphatics: Denies new lymphadenopathy  Neurological:Denies numbness, tingling or new weaknesses (+) syncope (+) light headed Behavioral/Psych: Mood is stable, no new changes  All other systems were reviewed with the patient and are negative.  MEDICAL HISTORY:  Past Medical History:  Diagnosis Date  . ALLERGIC RHINITIS   . Anxiety   . Asthma   . Atrial fibrillation (HCC) [place] Back pain of thoracolumbar region    right  . Bronchiectasis   . Colles' fracture of left radius   . COPD (chronic obstructive pulmonary disease) (HCC) [place] Coronary artery disease   . Diastolic dysfunction   . Diverticulosis   . Dysrhythmia   . History of pneumonia   . Hx of cardiovascular stress test    a. ETT-MV 1/14: Exercised 4:16, no ECG changes, poor ex tol, ant defect c/w soft tissue atten, no ischemia, EF 75%  . Hx of colonic polyp   . Hypertension   . IBS (irritable bowel syndrome)   . Insomnia   . Multiple rib fractures  10/2008   bilateral  . Pleural effusion, left   . Pruritus   . Pulmonary nodule   . Syncope     SURGICAL HISTORY: Past Surgical History:  Procedure Laterality Date  . ANKLE CLOSED REDUCTION Right 12/11/2014   Procedure: CLOSED REDUCTION ANKLE;  Surgeon: Rod Can, MD;  Location: [facility];  Service: Orthopedics;  Laterality: Right;  .  APPENDECTOMY    . BLADDER SUSPENSION  2005   A-P  . CHOLECYSTECTOMY    . COLONOSCOPY  04/06/2012   Procedure: COLONOSCOPY;  Surgeon: Jerene Bears, MD;  Location: [facility];  Service: Gastroenterology;  Laterality: N/A;  . EXTERNAL FIXATION LEG Right 12/12/2014   Procedure: ADJUSTMENT OF EXTERNAL FIXATION  RIGHT ANKLE;  Surgeon: Rod Can, MD;  Location: [facility];  Service: Orthopedics;  Laterality: Right;  . EXTERNAL FIXATION LEG Right 12/11/2014   Procedure: POSSIBLE EXTERNAL FIXATION RIGHT ANKLE;  Surgeon: Rod Can, MD;  Location: [facility];  Service: Orthopedics;  Laterality: Right;  . HARDWARE REMOVAL Right 12/25/2014   Procedure: RIGHT ANKLE REMOVE OF DEEP IMPLANT ANE EXTERNAL FIXATOR ;  Surgeon: [name], MD;  Location: [facility];  Service: Orthopedics;  Laterality: Right;  . ORIF ANKLE FRACTURE Right 12/25/2014   Procedure: OPEN REDUCTION INTERNAL FIXATION (ORIF)TRIMALLEOLAR  ANKLE FRACTURE;  Surgeon: [name], MD;  Location: [facility];  Service: Orthopedics;  Laterality: Right;  . ORIF WRIST FRACTURE Left 2010     Dr. Burney Gauze.  Marland Kitchen POLYPECTOMY    . ROTATOR CUFF REPAIR Right 2006  . TONSILLECTOMY    . TONSILLECTOMY    . TOTAL KNEE ARTHROPLASTY Right 08/2010    I have reviewed the social history and family history with the patient and they are unchanged from previous note.  ALLERGIES:  is allergic to albuterol sulfate; levaquin [levofloxacin in d5w]; doxycycline; lactose intolerance (gi); amoxicillin; codeine; penicillins; sulfa antibiotics; and sulfonamide derivatives.  MEDICATIONS:  Current Outpatient Prescriptions  Medication Sig Dispense Refill  . acetaminophen (TYLENOL) 500 MG tablet Take 1,000 mg by mouth every 6 (six) hours as needed for headache (pain). Not to exceed 3038m daily    . anagrelide (AGRYLIN) 0.5 MG capsule Take 2 tablets (1 mg) by mouth every morning and 1 tablet (0.5 mg) at night    . aspirin EC 81 MG tablet Take 1  tablet (81 mg total) by mouth daily. 90 tablet 3  . bimatoprost (LUMIGAN) 0.01 % SOLN Place 1 drop into both eyes at bedtime. 2.5 mL 3  . budesonide-formoterol (SYMBICORT) 160-4.5 MCG/ACT inhaler Inhale 2 puffs into the lungs 2 (two) times daily. 1 Inhaler 2  . clonazePAM (KLONOPIN) 0.5 MG tablet Take 0.5 tablets (0.25 mg total) by mouth 3 (three) times daily as needed (anxiety). 15 tablet 0  . dicyclomine (BENTYL) 10 MG capsule Take 1 capsule (10 mg total) by mouth 3 (three) times daily before meals. IBS 90 capsule 3  . furosemide (LASIX) 20 MG tablet Take 1 tablet (20 mg total) by mouth daily. Take (40 mg ) for 3 days starting 4/24 - 426 than (20 mg ) daily 30 tablet 9  . loperamide (IMODIUM) 2 MG capsule Take 2 mg by mouth as needed for diarrhea or loose stools.     . magnesium oxide (MAG-OX) 400 MG tablet Take 400 mg by mouth daily as needed (leg cramps).    . Multiple Vitamin (MULTIVITAMIN WITH MINERALS) TABS tablet Take 1 tablet by mouth daily.    . nebivolol (BYSTOLIC) 2.5  MG tablet Take 1 tablet (2.5 mg total) by mouth daily. 30 tablet 3  . zolpidem (AMBIEN) 5 MG tablet Take 1 tablet (5 mg total) by mouth at bedtime as needed for sleep. 30 tablet 5  . clindamycin (CLEOCIN) 300 MG capsule Take 600 mg by mouth See admin instructions. Take 2 capsules (600 mg) by mouth one hour prior to dental procedures    . diltiazem (CARDIZEM CD) 240 MG 24 hr capsule Take 1 capsule (240 mg total) by mouth daily. 30 capsule 3   No current facility-administered medications for this visit.     PHYSICAL EXAMINATION: ECOG PERFORMANCE STATUS: 3  Vitals:   01/16/17 1245  BP: (!) 132/48  Pulse: 87  Resp: 18   Filed Weights   01/16/17 1245  Weight: 133 lb (60.3 kg)    GENERAL:alert, no distress and comfortable SKIN: skin color, texture, turgor are normal, no rashes or significant lesions EYES: normal, Conjunctiva are pink and non-injected, sclera clear OROPHARYNX:no exudate, no erythema and lips,  buccal mucosa, and tongue normal  EARS: (+) Hearing aids. NECK: supple, thyroid normal size, non-tender, without nodularity LYMPH:  no palpable lymphadenopathy in the cervical, axillary or inguinal LUNGS: clear to auscultation and percussion with normal breathing effort HEART: regular rate & rhythm and no murmurs and no lower extremity edema (+) atrial fibrillation  ABDOMEN:abdomen soft, non-tender and normal bowel sounds Musculoskeletal:no cyanosis of digits and no clubbing. (+) Ambulatory with a walker. NEURO: alert & oriented x 3 with fluent speech, no focal motor/sensory deficits  LABORATORY DATA:  I have reviewed the data as listed CBC Latest Ref Rng & Units 01/16/2017 01/09/2017 01/04/2017  WBC 3.9 - 10.3 10e3/uL 3.0(L) 3.2(L) 2.7(L)  Hemoglobin 11.6 - 15.9 g/dL 9.4(L) - 8.9(L)  Hematocrit 34.8 - 46.6 % 29.7(L) 27.9(L) 27.5(L)  Platelets 145 - 400 10e3/uL 820(H) 571(H) 749(H)   CMP Latest Ref Rng & Units 01/16/2017 01/09/2017 01/04/2017  Glucose 70 - 140 mg/dl 148(H) 130(H) 127(H)  BUN 7.0 - 26.0 mg/dL 18.5 14 31(H)  Creatinine 0.6 - 1.1 mg/dL 0.8 0.69 1.01(H)  Sodium 136 - 145 mEq/L 141 136 136  Potassium 3.5 - 5.1 mEq/L 3.6 4.6 3.7  Chloride 96 - 106 mmol/L - 98 105  CO2 22 - 29 mEq/L _0 Calcium 8.4 - 10.4 mg/dL 9.2 8.4(L) 8.8(L)  Total Protein 6.4 - 8.3 g/dL 6.4 5.9(L) -  Total Bilirubin 0.20 - 1.20 mg/dL 0.68 1.3(H) -  Alkaline Phos 40 - 150 U/L 74 69 -  AST 5 - 34 U/L 15 14 -  ALT 0 - 55 U/L 17 12 -   PATHOLOGY:  11/14/2016   Diagnosis 11/07/2016 Bone Marrow, Aspirate,Biopsy, and Clot, left iliac crest - HYPERCELLULAR BONE MARROW FOR AGE WITH FEATURES OF MYELOID NEOPLASM. - SEE COMMENT. PERIPHERAL BLOOD: - MACROCYTIC ANEMIA. - LEUKOPENIA. - MARKED THROMBOCYTOSIS. Diagnosis Note The sections show a hypercellular bone marrow for age with dyspoietic changes involving myeloid cell lines associated with prominent increase in megakaryocytes and peripheral  thrombocytosis. Definite increase in blastic cells is not identified by morphology, immunohistochemistry or flow cytometric studies. The overall features are most compatible with a myelodysplastic/myeloproliferative neoplasm. Correlation with cytogenetic and FISH studies is recommended. (BNS:kh 11-08-16)  RADIOGRAPHIC STUDIES: I have personally reviewed the radiological images as listed and agreed with the findings in the report.  CT Angio Chest PE 01/04/17 IMPRESSION: 1. No evidence of pulmonary emboli or other acute abnormality in the chest. 2. Scattered small lung nodules measuring  up to 3 mm in size including one new left lower lobe nodule, most likely benign. 3.  Aortic Atherosclerosis (ICD10-I70.0).  DG Chest 2 View 12/25/2016 IMPRESSION: No active cardiopulmonary disease.  US Abdomen 11/23/2016 IMPRESSION: No acute abnormality of the liver or spleen. A 7 mm cyst in the right hepatic lobe is observed. No gallstones or sonographic evidence of acute cholecystitis. Bilateral renal cysts.  ASSESSMENT & PLAN:  83 y.o. female presents With worsening fatigue and dyspnea on exertion since November 2017, CBC reviewed severe neutropenia, moderate anemia, and significant thrombocytosis.  1. 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 Talbert Cage, I previously reviewed the lab results with her. -Her iron study, vitamin V42, folic acid levels are normal, however she does have elevated methylmalonic acid level, support mild B12 deficiency. I do not think her B12 deficiency can explain her abnormal CBC, especially thrombocytosis, however it may contribute to her severe fatigue. I recommend her to start B12 injection today and continue monthly. She does take B12 orally. -No lab evidence of hemolysis, haptoglobin was normal, LDH was normal -SPEP was negative for M protein -I previously reviewed her bone marrow biopsy with patient and her  husband, which showed MDS and MPN, no evidence for myelofibrosis. -We previously reviewed the nature history of MDS and MPN, unfortunately this is not curable disease, but is very treatable. -Her cytogenetics is normal, her MDS IPSS-R score 2.5, low risk, which predicts median survival of 5 years. Due to her MPN, her overall risk is probably little higher. -She has started Aranesp 138mg every 2 weeks, responded well. -She was on Anagrelide 0.5 mg twice daily, tolerated well, thrombocytosis had improved, but still high overall. I increased her dose to 1 mg in the morning, and 1 mg in the evening starting 01/03/2017 but she lately has been taking 3 tabs (1.548m/day . PLT trending up again, 820K today. - Today, 01/16/17, I instructed the patient to take (2) Anagrelide 1 mg tablets daily. If this increase does not improve her thrombocytosis, we will further increase the dose  -I encouraged her to continue aspirin, to reduce her risk of thrombosis. -Aranesp injection every 2 weeks if Hb<11  -I previously reviewed the abdominal [place] 11/23/16 with the patient in detail. No splenomegaly. -continue monitoring lab every 2 weeks -Labs reviewed, her anemia is improving. Thrombocytosis is worsening. The patient has been complaining of extreme fatigue lately, especially last week, pt and her family discussed about hospice  -In general, the life expectancy of patients with MDS and MPN are longer than 6 months. However due to her advanced age, and other multiple comorbidities, her prognosis is much worse than average patients. I encouraged patient to continue treatment if she is able to tolerate, but if she declines further, and not able to continue treatment, I think hospice is reasonable. She is in complete agreement and wants to continue therapy as long as she can tolerate.  -I have spoken with patient's daughter LiGolden Circlemart after her clinic visit today, she agrees.   2.  Severe fatigue and dyspnea on  exertion -Likely related to her moderate anemia, and underlying MDS/MPN -Improved after she started treatment, however her fatigue got much worse again lately. -She had an episode of syncope a few weeks ago, was hospitalized, and seen by cardiologist. Her echo showed normal EF. -I encouraged continued follow-up with her cardiologist. -I did vitals working in the office and her oxygen level remains  to be 94-98% -Due to her severe symptoms and high risk of thrombosis from her underline MDS/MPN, I previously ordered a CTA of the chest to ruled out PE. CTA on 01/04/17 showed no evidence of PE.  3. Mild B12 deficiency -She has normal E 12 level, however methylmalonic acid level was elevated significantly -I have start her on B12 injection monthly  4. Depression and Anxiety -she is depressed because she is not being able to do all of the activities she use to do  5. HTN, AF -her hypertension meds has been held by her cardiologist recently due to hypotension and syncope -continue f/u with cardiology   6. Insomnia -I suggested melatonin.  7. Goal of care discussion  -We again discussed the incurable nature of her cancer, and the overall poor prognosis, especially if she does not have good response to treatment -The patient understands the goal of care is palliative. -she is agreeable with hospice if she declines further  -I recommend DNR/DNI, she is in agreement. She is not afraid of dying.    Plan - Labs reviewed today.  - Increase Anagrelide 1 mg twice daily. -Aranesp injection every 2 weeks if Hb<11. - B12 injection monthly. -I have called her daughter, Lori [name]. Pt gives permission to discuss her case with her daughter.   - Follow up in 2 weeks.    All questions were answered. The patient knows to call the clinic with any problems, questions or concerns. No barriers to learning was detected.  I spent 30 minutes counseling the patient face to face. The total time spent in the  appointment was 40 minutes and more than 50% was on counseling and review of test results  This document serves as a record of services personally performed by Truitt Merle, MD. It was created on her behalf by Maryla Morrow, a trained medical scribe. The creation of this record is based on the scribe's personal observations and the provider's statements to them. This document has been checked and approved by the attending provider.  I have reviewed the above documentation for accuracy and completeness and I agree with the above.    Truitt Merle, MD 01/16/2017

## 2017-01-17 ENCOUNTER — Telehealth: Payer: Self-pay | Admitting: *Deleted

## 2017-01-17 NOTE — Telephone Encounter (Signed)
FYI "Lorie RN, [facility] [phone]) calling for a fax number for Dr. Burr Medico.  Patient was seen yesterday.  Reports changes were made in her medications.  We fill her medication box.  Will fax a letter for Dr. Burr Medico to send orders to pharmacy and also send any orders to [place] in order for [place] to make changes to her medicine box."  Provided fax number [phone].

## 2017-01-20 ENCOUNTER — Telehealth: Payer: Self-pay | Admitting: Hematology

## 2017-01-20 NOTE — Telephone Encounter (Signed)
No los per 01/16/17 visit.

## 2017-01-23 DIAGNOSIS — I10 Essential (primary) hypertension: Secondary | ICD-10-CM | POA: Diagnosis not present

## 2017-01-23 DIAGNOSIS — J449 Chronic obstructive pulmonary disease, unspecified: Secondary | ICD-10-CM | POA: Diagnosis not present

## 2017-01-23 DIAGNOSIS — R2681 Unsteadiness on feet: Secondary | ICD-10-CM | POA: Diagnosis not present

## 2017-01-23 DIAGNOSIS — R0609 Other forms of dyspnea: Secondary | ICD-10-CM | POA: Diagnosis not present

## 2017-01-23 DIAGNOSIS — C946 Myelodysplastic disease, not classified: Secondary | ICD-10-CM | POA: Diagnosis not present

## 2017-01-25 DIAGNOSIS — C946 Myelodysplastic disease, not classified: Secondary | ICD-10-CM | POA: Diagnosis not present

## 2017-01-25 DIAGNOSIS — R2681 Unsteadiness on feet: Secondary | ICD-10-CM | POA: Diagnosis not present

## 2017-01-25 DIAGNOSIS — J449 Chronic obstructive pulmonary disease, unspecified: Secondary | ICD-10-CM | POA: Diagnosis not present

## 2017-01-25 DIAGNOSIS — R0609 Other forms of dyspnea: Secondary | ICD-10-CM | POA: Diagnosis not present

## 2017-01-25 DIAGNOSIS — I471 Supraventricular tachycardia: Secondary | ICD-10-CM | POA: Diagnosis not present

## 2017-01-26 NOTE — Progress Notes (Signed)
[place]  [phone] [phone]  Clinic Follow up Note   Patient Care Team: Leanna Battles, MD as PCP - General (Internal Medicine) Brand Males, MD as Consulting Physician (Pulmonary Disease) [name], MD as Consulting Physician (Orthopedic Surgery) 01/30/2017  CHIEF COMPLAIN: follow up MDS/MPN   HISTORY OF PRESENT ILLNESS (From Dr. Laverle Patter note on 10/11/2016): Lori [name] 83 y.o. [name] is here for evaluation of leukopenia. Patient states that she's been having severe fatigue since November 2017. She was having trouble with her atrial fibrillation again, however this has improved. CBC from 12/617 demonstrated WBC 4.83K, hemoglobin 10.9 g/dL, hematocrit 31.8%, MCV 105.4, place a count 764K. CBC from 04/11/16 demonstrated WBC 4.1K, hemoglobin 12.5 g/dL, hematocrit 38.1%, platelet count 693K. Most recent CBC from 09/28/16 demonstrated worsening of her leukopenia and anemia with WBC 2K, hemoglobin 9.6 g/dL, hematocrit 28.5%, MCV 107, platelet count 748K. She also has diarrhea about 3-4 times a day, however she states that this is been a lifelong problem for her. She has lost about 5 pounds in the past 2 months. She also has occasional shortness of breath. She denies any recent changes in her medications.  CURRENT THERAPY:  1. Aranesp 148mg every 2 weeks, started on 11/18/2016 2. Anagrelide 0.592mBID started on 11/11/2016, increased to 1.74m19mn am and 0.5mg52m pm on 12/06/2016, and 1mg 44m on 01/16/2017, 2mg i31mhe morning and 1 mg in the evening starting 01/30/17   INTERVAL HISTORY: She returns for follow up. She presents to the clinic with her care giver Linda.Vaughan Bastareports [place] a bad night a few days ago and she was helped with Oxygen. She is very hard of hearing. She has not talked to Dr. GerharMilton Ferguson her Lasix use. Her care giver is doing 2 days a week and the rest is tentatively. She is taking 4 tablets  of Anagrelide a day.   She is SOB all the time. She could not breath and could not get oxygen in the middle of the night. She does not have to do much to be SOB. Her PCP did not prescribe her oxygen. She feels she is drugged after taking all her medication. Her care giver reports that she drinks a lot of water and does not urinate much. She wants to know if she has to stop her activities like playing long games of bridge and walking in the pool. She reports in the pool someone is always watching her. She reports her heartbeat to being fast and that she has a history of atrial fibrillation. She does not have an appointment with her cardiologist yet. She would like to be referred to Dr. GerharRoxy Horsemanse it will be faster.     REVIEW OF SYSTEMS:   Constitutional: Denies fevers, chills or abnormal weight loss  Eyes: Denies blurriness of vision Ears, nose, mouth, throat, and face: Denies mucositis or sore throat (+) hearing loss Respiratory: Denies cough, dyspnea or wheezes (+) SOB Cardiovascular: Denies chest discomfort or lower extremity swelling (+) chest tightness (+) tachycardia (+) atrial fibrilation Gastrointestinal:  Denies nausea, heartburn or change in bowel habits (+) nausea (+) diarrhea (+) vomiting Skin: Denies abnormal skin rashes Lymphatics: Denies new lymphadenopathy  Neurological:Denies numbness, tingling or new weaknesses (+) syncope (+) light headed Behavioral/Psych: Mood is stable, no new changes (+) anxiety due to SOB All other systems were reviewed with the patient and are negative.  MEDICAL HISTORY:  Past Medical History:  Diagnosis Date  . ALLERGIC RHINITIS   . Anxiety   . Asthma   . Atrial fibrillation ([place])   . Back pain of thoracolumbar region    right  . Bronchiectasis   . Colles' fracture of left radius   . COPD (chronic obstructive pulmonary disease) ([place])   . Coronary artery disease   . Diastolic dysfunction   . Diverticulosis   . Dysrhythmia   . History of  pneumonia   . Hx of cardiovascular stress test    a. ETT-MV 1/14: Exercised 4:16, no ECG changes, poor ex tol, ant defect c/w soft tissue atten, no ischemia, EF 75%  . Hx of colonic polyp   . Hypertension   . IBS (irritable bowel syndrome)   . Insomnia   . Multiple rib fractures 10/2008   bilateral  . Pleural effusion, left   . Pruritus   . Pulmonary nodule   . Syncope     SURGICAL HISTORY: Past Surgical History:  Procedure Laterality Date  . ANKLE CLOSED REDUCTION Right 12/11/2014   Procedure: CLOSED REDUCTION ANKLE;  Surgeon: Rod Can, MD;  Location: [facility];  Service: Orthopedics;  Laterality: Right;  . APPENDECTOMY    . BLADDER SUSPENSION  2005   A-P  . CHOLECYSTECTOMY    . COLONOSCOPY  04/06/2012   Procedure: COLONOSCOPY;  Surgeon: Jerene Bears, MD;  Location: [facility];  Service: Gastroenterology;  Laterality: N/A;  . EXTERNAL FIXATION LEG Right 12/12/2014   Procedure: ADJUSTMENT OF EXTERNAL FIXATION  RIGHT ANKLE;  Surgeon: Rod Can, MD;  Location: [facility];  Service: Orthopedics;  Laterality: Right;  . EXTERNAL FIXATION LEG Right 12/11/2014   Procedure: POSSIBLE EXTERNAL FIXATION RIGHT ANKLE;  Surgeon: Rod Can, MD;  Location: [facility];  Service: Orthopedics;  Laterality: Right;  . HARDWARE REMOVAL Right 12/25/2014   Procedure: RIGHT ANKLE REMOVE OF DEEP IMPLANT ANE EXTERNAL FIXATOR ;  Surgeon: [name], MD;  Location: [facility];  Service: Orthopedics;  Laterality: Right;  . ORIF ANKLE FRACTURE Right 12/25/2014   Procedure: OPEN REDUCTION INTERNAL FIXATION (ORIF)TRIMALLEOLAR  ANKLE FRACTURE;  Surgeon: [name], MD;  Location: [facility];  Service: Orthopedics;  Laterality: Right;  . ORIF WRIST FRACTURE Left 2010     Dr. Burney Gauze.  Marland Kitchen POLYPECTOMY    . ROTATOR CUFF REPAIR Right 2006  . TONSILLECTOMY    . TONSILLECTOMY    . TOTAL KNEE ARTHROPLASTY Right 08/2010    I have reviewed the social history and family history with the  patient and they are unchanged from previous note.  ALLERGIES:  is allergic to albuterol sulfate; levaquin [levofloxacin in d5w]; doxycycline; lactose intolerance (gi); amoxicillin; codeine; penicillins; sulfa antibiotics; and sulfonamide derivatives.  MEDICATIONS:  Current Outpatient Prescriptions  Medication Sig Dispense Refill  . acetaminophen (TYLENOL) 500 MG tablet Take 1,000 mg by mouth every 6 (six) hours as needed for headache (pain). Not to exceed 3013m daily    . aspirin EC 81 MG tablet Take 1 tablet (81 mg total) by mouth daily. 90 tablet 3  . bimatoprost (LUMIGAN) 0.01 % SOLN Place 1 drop into both eyes at bedtime. 2.5 mL 3  . budesonide-formoterol (SYMBICORT) 160-4.5 MCG/ACT inhaler Inhale 2 puffs into the lungs 2 (two) times daily. 1 Inhaler 2  . dicyclomine (BENTYL) 10 MG capsule Take 1 capsule (10 mg total) by mouth 3 (three) times daily before meals. IBS 90 capsule 3  . magnesium oxide (MAG-OX) 400 MG tablet Take 400 mg by mouth  daily as needed (leg cramps).    . Multiple Vitamin (MULTIVITAMIN WITH MINERALS) TABS tablet Take 1 tablet by mouth daily.    . nebivolol (BYSTOLIC) 2.5 MG tablet Take 1 tablet (2.5 mg total) by mouth daily. 30 tablet 3  . zolpidem (AMBIEN) 5 MG tablet Take 1 tablet (5 mg total) by mouth at bedtime as needed for sleep. 30 tablet 5  . anagrelide (AGRYLIN) 1 MG capsule Take 2 tab in the morning, and 1 tab in everning 90 capsule 0  . clindamycin (CLEOCIN) 300 MG capsule Take 600 mg by mouth See admin instructions. Take 2 capsules (600 mg) by mouth one hour prior to dental procedures    . clonazePAM (KLONOPIN) 0.5 MG tablet Take 0.5 tablets (0.25 mg total) by mouth 3 (three) times daily as needed (anxiety). (Patient not taking: Reported on 01/30/2017) 15 tablet 0  . diltiazem (CARDIZEM CD) 240 MG 24 hr capsule Take 1 capsule (240 mg total) by mouth daily. 30 capsule 3  . furosemide (LASIX) 20 MG tablet Take 1 tablet (20 mg total) by mouth daily. Take (40 mg )  for 3 days starting 4/24 - 426 than (20 mg ) daily (Patient not taking: Reported on 01/30/2017) 30 tablet 9  . loperamide (IMODIUM) 2 MG capsule Take 2 mg by mouth as needed for diarrhea or loose stools.      No current facility-administered medications for this visit.     PHYSICAL EXAMINATION: ECOG PERFORMANCE STATUS: 3  Vitals:   01/30/17 1148  BP: (!) 163/65  Pulse: (!) 115  Resp: 18  Temp: 98.1 F (36.7 C)   Filed Weights   01/30/17 1148  Weight: 133 lb 8 oz (60.6 kg)     GENERAL:alert, no distress and comfortable SKIN: skin color, texture, turgor are normal, no rashes or significant lesions EYES: normal, Conjunctiva are pink and non-injected, sclera clear OROPHARYNX:no exudate, no erythema and lips, buccal mucosa, and tongue normal  EARS: (+) Hearing aids. NECK: supple, thyroid normal size, non-tender, without nodularity LYMPH:  no palpable lymphadenopathy in the cervical, axillary or inguinal LUNGS: clear to auscultation and percussion with normal breathing effort HEART: rhythm and no murmurs and no lower extremity edema (+) atrial fibrillation (+) tachycardia  ABDOMEN:abdomen soft, non-tender and normal bowel sounds Musculoskeletal:no cyanosis of digits and no clubbing. (+) Ambulatory with a walker. NEURO: alert & oriented x 3 with fluent speech, no focal motor/sensory deficits  LABORATORY DATA:  I have reviewed the data as listed CBC Latest Ref Rng & Units 01/30/2017 01/16/2017 01/09/2017  WBC 3.9 - 10.3 10e3/uL 4.5 3.0(L) 3.2(L)  Hemoglobin 11.6 - 15.9 g/dL 9.1(L) 9.4(L) -  Hematocrit 34.8 - 46.6 % 28.9(L) 29.7(L) 27.9(L)  Platelets 145 - 400 10e3/uL 749(H) 820(H) 571(H)   CMP Latest Ref Rng & Units 01/16/2017 01/09/2017 01/04/2017  Glucose 70 - 140 mg/dl 148(H) 130(H) 127(H)  BUN 7.0 - 26.0 mg/dL 18.5 14 31(H)  Creatinine 0.6 - 1.1 mg/dL 0.8 0.69 1.01(H)  Sodium 136 - 145 mEq/L 141 136 136  Potassium 3.5 - 5.1 mEq/L 3.6 4.6 3.7  Chloride 96 - 106 mmol/L - 98 105    CO2 22 - 29 mEq/L _0 Calcium 8.4 - 10.4 mg/dL 9.2 8.4(L) 8.8(L)  Total Protein 6.4 - 8.3 g/dL 6.4 5.9(L) -  Total Bilirubin 0.20 - 1.20 mg/dL 0.68 1.3(H) -  Alkaline Phos 40 - 150 U/L 74 69 -  AST 5 - 34 U/L 15 14 -  ALT 0 -  55 U/L 17 12 -   PATHOLOGY:  11/14/2016   Diagnosis 11/07/2016 Bone Marrow, Aspirate,Biopsy, and Clot, left iliac crest - HYPERCELLULAR BONE MARROW FOR AGE WITH FEATURES OF MYELOID NEOPLASM. - SEE COMMENT. PERIPHERAL BLOOD: - MACROCYTIC ANEMIA. - LEUKOPENIA. - MARKED THROMBOCYTOSIS. Diagnosis Note The sections show a hypercellular bone marrow for age with dyspoietic changes involving myeloid cell lines associated with prominent increase in megakaryocytes and peripheral thrombocytosis. Definite increase in blastic cells is not identified by morphology, immunohistochemistry or flow cytometric studies. The overall features are most compatible with a myelodysplastic/myeloproliferative neoplasm. Correlation with cytogenetic and FISH studies is recommended. (BNS:kh 11-08-16)   RADIOGRAPHIC STUDIES: I have personally reviewed the radiological images as listed and agreed with the findings in the report.  CT Angio Chest PE 01/04/17 IMPRESSION: 1. No evidence of pulmonary emboli or other acute abnormality in the chest. 2. Scattered small lung nodules measuring up to 3 mm in size including one new left lower lobe nodule, most likely benign. 3.  Aortic Atherosclerosis (ICD10-I70.0).  DG Chest 2 View 12/25/2016 IMPRESSION: No active cardiopulmonary disease.  US Abdomen 11/23/2016 IMPRESSION: No acute abnormality of the liver or spleen. A 7 mm cyst in the right hepatic lobe is observed. No gallstones or sonographic evidence of acute cholecystitis. Bilateral renal cysts.   ECHO 12/26/16 Study Conclusions - Left ventricle: The cavity size was normal. There was mild   concentric hypertrophy. Systolic function was normal. The   estimated ejection fraction was  in the range of 60% to 65%. Wall   motion was normal; there were no regional wall motion   abnormalities. The study was not technically sufficient to allow   evaluation of LV diastolic dysfunction due to atrial   fibrillation. - Aortic valve: Trileaflet; normal thickness leaflets. There was no   regurgitation. - Aortic root: The aortic root was normal in size. - Right ventricle: The cavity size was normal. Wall thickness was   normal. Systolic function was normal. - Right atrium: The atrium was normal in size. - Tricuspid valve: There was mild regurgitation. - Pulmonary arteries: Systolic pressure was moderately increased.   PA peak pressure: 49 mm Hg (S). - Systemic veins: Not visualized. - Pericardium, extracardiac: There was no pericardial effusion.  ASSESSMENT & PLAN:  83 y.o. [name] presents With worsening fatigue and dyspnea on exertion since November 2017, CBC reviewed severe neutropenia, moderate anemia, and significant thrombocytosis.  1. 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 Talbert Cage, I previously reviewed the lab results with her. -Her iron study, vitamin V49, folic acid levels are normal, however she does have elevated methylmalonic acid level, support mild B12 deficiency. I do not think her B12 deficiency can explain her abnormal CBC, especially thrombocytosis, however it may contribute to her severe fatigue. I recommend her to start B12 injection today and continue monthly. She does take B12 orally. -No lab evidence of hemolysis, haptoglobin was normal, LDH was normal -SPEP was negative for M protein -I previously reviewed her bone marrow biopsy with patient and her husband, which showed MDS and MPN, no evidence for myelofibrosis. -We previously reviewed the nature history of MDS and MPN, unfortunately this is not curable disease, but is very treatable. -Her cytogenetics is normal, her MDS IPSS-R score 2.5,  low risk, which predicts median survival of 5 years. Due to her MPN, her overall risk is probably little higher. -She has started Aranesp 14mg every 2 weeks, responded well. -  Due to her persistent thrombocytopenia, I'll increase her anagrelide to 2 mg in the morning and 1 mg in evening -I encouraged her to continue aspirin, to reduce her risk of thrombosis. -In general, the life expectancy of patients with MDS and MPN are longer than 6 months. However due to her advanced age, and other multiple comorbidities, her prognosis is much worse than average patients. I previously encouraged patient to continue treatment if she is able to tolerate, but if she declines further, and not able to continue treatment, I think hospice is reasonable. She is in complete agreement and wants to continue therapy as long as she can tolerate.  -continue monitoring and supportive care   2.  Severe fatigue and dyspnea on exertion -Likely related to her moderate anemia, and underlying MDS/MPN -Improved after she started treatment, however her fatigue got much worse again lately. -She had an episode of syncope a few weeks ago, was hospitalized, and seen by cardiologist. Her echo showed normal EF. -I previously encouraged continued follow-up with her cardiologist. -I did vitals working in the office and her oxygen level remains to be 94-98% -Due to her severe symptoms and high risk of thrombosis from her underline MDS/MPN, I previously ordered a CTA of the chest to ruled out PE. CTA on 01/04/17 showed no evidence of PE. -her dyspnea is probably related to her heart condition -Her PCP is trying to get home oxygen for her  3. AF with RVR -Her cardiac exam today showed atrial fibrillation, with heart rate in 110s -She has had intermittent dyspnea, sometimes severe. I encouraged her to see her cardiologist NP Kathrynn Humble this week, I will send her a message about her condition today    4. Mild B12 deficiency -She has  normal E 12 level, however methylmalonic acid level was elevated significantly -I have start her on B12 injection monthly  5. Depression and Anxiety -she is depressed because she is not being able to do all of the activities she use to do  6. HTN -her hypertension meds has been held by her cardiologist recently due to hypotension and syncope -continue f/u with cardiology   7. Insomnia -I suggested melatonin.  8. Goal of care discussion  -We again discussed the incurable nature of her cancer, and the overall poor prognosis, especially if she does not have good response to treatment -The patient understands the goal of care is palliative. -she is agreeable with hospice if she declines further  -I recommend DNR/DNI, she is in agreement. She is not afraid of dying.    Plan -Lab and injection every 2 weeks X3 -f/u in 6 weeks  -will set appointment with Dr. Roxy Horseman -Anagrelide increased to 16m in the morning and 139min the evening    All questions were answered. The patient knows to call the clinic with any problems, questions or concerns. No barriers to learning was detected.  I spent 25 minutes counseling the patient face to face. The total time spent in the appointment was 30 minutes and more than 50% was on counseling and review of test results  This document serves as a record of services personally performed by YaTruitt MerleMD. It was created on her behalf by AmJoslyn Devona trained medical scribe. The creation of this record is based on the scribe's personal observations and the provider's statements to them. This document has been checked and approved by the attending provider.   I have reviewed the above documentation for accuracy and completeness and  I agree with the above.    Truitt Merle, MD 01/30/2017

## 2017-01-30 ENCOUNTER — Ambulatory Visit: Payer: Medicare Other | Admitting: Hematology

## 2017-01-30 ENCOUNTER — Ambulatory Visit (HOSPITAL_BASED_OUTPATIENT_CLINIC_OR_DEPARTMENT_OTHER): Payer: Medicare Other

## 2017-01-30 ENCOUNTER — Other Ambulatory Visit: Payer: Medicare Other

## 2017-01-30 ENCOUNTER — Telehealth: Payer: Self-pay | Admitting: Hematology

## 2017-01-30 ENCOUNTER — Ambulatory Visit: Payer: Medicare Other

## 2017-01-30 ENCOUNTER — Other Ambulatory Visit (HOSPITAL_BASED_OUTPATIENT_CLINIC_OR_DEPARTMENT_OTHER): Payer: Medicare Other

## 2017-01-30 ENCOUNTER — Ambulatory Visit (HOSPITAL_BASED_OUTPATIENT_CLINIC_OR_DEPARTMENT_OTHER): Payer: Medicare Other | Admitting: Hematology

## 2017-01-30 VITALS — BP 163/65 | HR 115 | Temp 98.1°F | Resp 18 | Ht 65.0 in | Wt 133.5 lb

## 2017-01-30 DIAGNOSIS — D61818 Other pancytopenia: Secondary | ICD-10-CM | POA: Diagnosis not present

## 2017-01-30 DIAGNOSIS — R530 Neoplastic (malignant) related fatigue: Secondary | ICD-10-CM | POA: Diagnosis not present

## 2017-01-30 DIAGNOSIS — G4700 Insomnia, unspecified: Secondary | ICD-10-CM

## 2017-01-30 DIAGNOSIS — E538 Deficiency of other specified B group vitamins: Secondary | ICD-10-CM

## 2017-01-30 DIAGNOSIS — F419 Anxiety disorder, unspecified: Secondary | ICD-10-CM | POA: Diagnosis not present

## 2017-01-30 DIAGNOSIS — F329 Major depressive disorder, single episode, unspecified: Secondary | ICD-10-CM

## 2017-01-30 DIAGNOSIS — D469 Myelodysplastic syndrome, unspecified: Secondary | ICD-10-CM

## 2017-01-30 DIAGNOSIS — J449 Chronic obstructive pulmonary disease, unspecified: Secondary | ICD-10-CM | POA: Diagnosis not present

## 2017-01-30 LAB — CBC & DIFF AND RETIC
BASO%: 0 % (ref 0.0–2.0)
Basophils Absolute: 0 10*3/uL (ref 0.0–0.1)
EOS%: 0.2 % (ref 0.0–7.0)
Eosinophils Absolute: 0 10*3/uL (ref 0.0–0.5)
HEMATOCRIT: 28.9 % — AB (ref 34.8–46.6)
HGB: 9.1 g/dL — ABNORMAL LOW (ref 11.6–15.9)
Immature Retic Fract: 10.1 % — ABNORMAL HIGH (ref 1.60–10.00)
LYMPH%: 31.3 % (ref 14.0–49.7)
MCH: 31.6 pg (ref 25.1–34.0)
MCHC: 31.5 g/dL (ref 31.5–36.0)
MCV: 100.3 fL (ref 79.5–101.0)
MONO#: 0.1 10*3/uL (ref 0.1–0.9)
MONO%: 2.6 % (ref 0.0–14.0)
NEUT#: 3 10*3/uL (ref 1.5–6.5)
NEUT%: 65.9 % (ref 38.4–76.8)
Platelets: 749 10*3/uL — ABNORMAL HIGH (ref 145–400)
RBC: 2.88 10*6/uL — ABNORMAL LOW (ref 3.70–5.45)
RDW: 15.9 % — ABNORMAL HIGH (ref 11.2–14.5)
Retic %: 3.08 % — ABNORMAL HIGH (ref 0.70–2.10)
Retic Ct Abs: 88.7 10*3/uL (ref 33.70–90.70)
WBC: 4.5 10*3/uL (ref 3.9–10.3)
lymph#: 1.4 10*3/uL (ref 0.9–3.3)
nRBC: 0 % (ref 0–0)

## 2017-01-30 MED ORDER — DARBEPOETIN ALFA 150 MCG/0.3ML IJ SOSY
150.0000 ug | PREFILLED_SYRINGE | Freq: Once | INTRAMUSCULAR | Status: AC
  Administered 2017-01-30: 150 ug via SUBCUTANEOUS
  Filled 2017-01-30: qty 0.3

## 2017-01-30 MED ORDER — ANAGRELIDE HCL 1 MG PO CAPS: ORAL_CAPSULE | ORAL | 0 refills | Status: DC

## 2017-01-30 NOTE — Telephone Encounter (Signed)
Appointments scheduled per 01/30/17 los. Patient was given a copy of the AVS report and appointment schedule per 01/30/17 los. °

## 2017-01-30 NOTE — Patient Instructions (Signed)

## 2017-01-31 ENCOUNTER — Encounter: Payer: Self-pay | Admitting: Hematology

## 2017-01-31 ENCOUNTER — Telehealth: Payer: Self-pay | Admitting: Nurse Practitioner

## 2017-01-31 DIAGNOSIS — J449 Chronic obstructive pulmonary disease, unspecified: Secondary | ICD-10-CM | POA: Diagnosis not present

## 2017-01-31 DIAGNOSIS — J45998 Other asthma: Secondary | ICD-10-CM | POA: Diagnosis not present

## 2017-01-31 NOTE — Telephone Encounter (Signed)
SPOKE WITH PT'S HUSBAND  APPT MADE WITH  VIN  BHAGAT PA  ON  Thursday  02-02-17 AT 9:30  AM .Adonis Housekeeper

## 2017-01-31 NOTE — Telephone Encounter (Signed)
New Message     Pt husband says that the patient is having a problem with bp dropping , Pt husband states the Dr Burr Medico at the [facility] wants her to be seen immediately, but could not state why ? Pt previous pt of Dr Aundra Dubin, and Truitt Merle, pt husband does not want to wait for appt he wants her brought in today

## 2017-01-31 NOTE — Telephone Encounter (Signed)
-----   Message from Burtis Junes, NP sent at 01/31/2017 10:40 AM EDT ----- Can someone get her a visit with one of the APPs this week in my absence?  Thanks lori ----- Message ----- From: Truitt Merle, MD Sent: 01/31/2017  12:16 AM To: Burtis Junes, NP, [facility]  [place],  Please call cardiology NP Andree Elk office in the morning, she is currently out of office. Please request them to see this pt this week. She was found to have rapid AF during her office visit with [place] on 5/14, and she has been having intermittent severe dyspnea. Her primary care physician is trying to get home oxygen for her.  Thanks,  Krista Blue

## 2017-02-01 NOTE — Progress Notes (Signed)
Cardiology Office Note    Date:  02/02/2017   ID:  Lori [name], DOB 01/05/1926, MRN [id number]  PCP:  Leanna Battles, MD  Cardiologist:  Annabell Howells     Chief Complaint: ? AFib  History of Present Illness:   Lori [name] is a 83 y.o. female with hx of Asthma, COPD, CAD, Diverticulitis, pulmonary nodule, MDS/MPNaranesp, MAT (afib never been definitively diagnosed), chronic DOE due to pulmonary issue and HTN presents for possible afib.  It was noted that atrial fib has never been definitively diagnosed, but is felt to be multifocal atrial tachycardia by EKG. She has been on amiodarone in the past which was stopped due to skin discoloration and the patient does not want to restart. She is treated with cardizem. Not on anticoagulation as this is not felt to be atrial fibrillation and aside from that, anticoagulation is contraindicated.   Last seen by Truitt Merle 01/09/17. Recommended rehab with plan to go to assisted living. Daughter recommended hospice care --> defer to oncology.   Seen by Oncology (Dr. Burr Medico) 01/30/17. Encouraged continuation of treatment. If not able to tolerate --> plan for hospice. She was noted HR of 110 with ? AF and referred to [place] (no EKG in system). Also has intermittent dyspnea.  Here today for further evaluation of tachycardia and dyspnea. She has a chronic dyspnea on exertion. Worsened in past few days. She denies any orthopnea, PND, syncope, dizziness or lower extremity edema. He uses oxygen at night. She thinks that she might need to use during the daytime with ambulation.   Past Medical History:  Diagnosis Date  . ALLERGIC RHINITIS   . Anxiety   . Asthma   . Atrial fibrillation ([place])   . Back pain of thoracolumbar region    right  . Bronchiectasis   . Colles' fracture of left radius   . COPD (chronic obstructive pulmonary disease) ([place])   . Coronary artery disease   . Diastolic dysfunction   . Diverticulosis   . Dysrhythmia   . History of  pneumonia   . Hx of cardiovascular stress test    a. ETT-MV 1/14: Exercised 4:16, no ECG changes, poor ex tol, ant defect c/w soft tissue atten, no ischemia, EF 75%  . Hx of colonic polyp   . Hypertension   . IBS (irritable bowel syndrome)   . Insomnia   . Multiple rib fractures 10/2008   bilateral  . Pleural effusion, left   . Pruritus   . Pulmonary nodule   . Syncope     Past Surgical History:  Procedure Laterality Date  . ANKLE CLOSED REDUCTION Right 12/11/2014   Procedure: CLOSED REDUCTION ANKLE;  Surgeon: Rod Can, MD;  Location: [facility];  Service: Orthopedics;  Laterality: Right;  . APPENDECTOMY    . BLADDER SUSPENSION  2005   A-P  . CHOLECYSTECTOMY    . COLONOSCOPY  04/06/2012   Procedure: COLONOSCOPY;  Surgeon: Jerene Bears, MD;  [facility];  Service: Gastroenterology;  Laterality: N/A;  . EXTERNAL FIXATION LEG Right 12/12/2014   Procedure: ADJUSTMENT OF EXTERNAL FIXATION  RIGHT ANKLE;  Surgeon: Rod Can, MD;  Location: [facility];  Service: Orthopedics;  Laterality: Right;  . EXTERNAL FIXATION LEG Right 12/11/2014   Procedure: POSSIBLE EXTERNAL FIXATION RIGHT ANKLE;  Surgeon: Rod Can, MD;  Location: [facility];  Service: Orthopedics;  Laterality: Right;  . HARDWARE REMOVAL Right 12/25/2014   Procedure: RIGHT ANKLE REMOVE OF DEEP IMPLANT ANE EXTERNAL FIXATOR ;  Surgeon: [name], MD;  Location: [facility];  Service: Orthopedics;  Laterality: Right;  . ORIF ANKLE FRACTURE Right 12/25/2014   Procedure: OPEN REDUCTION INTERNAL FIXATION (ORIF)TRIMALLEOLAR  ANKLE FRACTURE;  Surgeon: [name], MD;  Location: [facility];  Service: Orthopedics;  Laterality: Right;  . ORIF WRIST FRACTURE Left 2010     Dr. Burney Gauze.  Marland Kitchen POLYPECTOMY    . ROTATOR CUFF REPAIR Right 2006  . TONSILLECTOMY    . TONSILLECTOMY    . TOTAL KNEE ARTHROPLASTY Right 08/2010    Current Medications: Prior to Admission medications   Medication Sig Start Date End  Date Taking? Authorizing Provider  acetaminophen (TYLENOL) 500 MG tablet Take 1,000 mg by mouth every 6 (six) hours as needed for headache (pain). Not to exceed 3000mg  daily    [provider]  anagrelide (AGRYLIN) 1 MG capsule Take 2 tab in the morning, and 1 tab in everning 01/30/17   Truitt Merle, MD  aspirin EC 81 MG tablet Take 1 tablet (81 mg total) by mouth daily. 06/02/15   Burtis Junes, NP  bimatoprost (LUMIGAN) 0.01 % SOLN Place 1 drop into both eyes at bedtime. 01/10/17   Reed, Tiffany L, DO  budesonide-formoterol (SYMBICORT) 160-4.5 MCG/ACT inhaler Inhale 2 puffs into the lungs 2 (two) times daily. 01/10/17   Reed, Tiffany L, DO  clindamycin (CLEOCIN) 300 MG capsule Take 600 mg by mouth See admin instructions. Take 2 capsules (600 mg) by mouth one hour prior to dental procedures    [provider]  clonazePAM (KLONOPIN) 0.5 MG tablet Take 0.5 tablets (0.25 mg total) by mouth 3 (three) times daily as needed (anxiety). Patient not taking: Reported on 01/30/2017 12/27/16   Geradine Girt, DO  dicyclomine (BENTYL) 10 MG capsule Take 1 capsule (10 mg total) by mouth 3 (three) times daily before meals. IBS 01/10/17   Reed, Tiffany L, DO  diltiazem (CARDIZEM CD) 240 MG 24 hr capsule Take 1 capsule (240 mg total) by mouth daily. 10/04/16 01/02/17  Burtis Junes, NP  furosemide (LASIX) 20 MG tablet Take 1 tablet (20 mg total) by mouth daily. Take (40 mg ) for 3 days starting 4/24 - 426 than (20 mg ) daily Patient not taking: Reported on 01/30/2017 01/10/17 04/10/17  Burtis Junes, NP  loperamide (IMODIUM) 2 MG capsule Take 2 mg by mouth as needed for diarrhea or loose stools.     [provider]  magnesium oxide (MAG-OX) 400 MG tablet Take 400 mg by mouth daily as needed (leg cramps).    [provider]  Multiple Vitamin (MULTIVITAMIN WITH MINERALS) TABS tablet Take 1 tablet by mouth daily.    [provider]  nebivolol (BYSTOLIC) 2.5 MG tablet Take 1  tablet (2.5 mg total) by mouth daily. 01/09/17   Reed, Tiffany L, DO  zolpidem (AMBIEN) 5 MG tablet Take 1 tablet (5 mg total) by mouth at bedtime as needed for sleep. 06/10/15   Larey Dresser, MD    Allergies:   Albuterol sulfate; Levaquin [levofloxacin in d5w]; Doxycycline; Lactose intolerance (gi); Amoxicillin; Codeine; Penicillins; Sulfa antibiotics; and Sulfonamide derivatives   Social History   Social History  . Marital status: Married    Spouse name: N/A  . Number of children: 3  . Years of education: N/A   Occupational History  . retired Retired   Social History Main Topics  . Smoking status: Former Smoker    Packs/day: 0.30    Years:  10.00    Types: Cigarettes    Quit date: 09/19/1948  . Smokeless tobacco: Never Used  . Alcohol use No     Comment: rarely  . Drug use: No  . Sexual activity: No   Other Topics Concern  . None   Social History Narrative   [place]; [place] [employer]. Married 1959 - spouse s/p valve surgery with resulting paralysis hemidiaphragm ('10). 3 daughter, 5 grandsons, 4 granddaughters.  1 daughter bipolar - social issues. She remains very active, but can't play tennis. Pt was apprently ex-smoker, but husband does not recollect pt ever smoking. Stressful move to smaller quarters (fall '09) and now moving to [place] [facility] (fall '12).      ACP - she clearly states No CPR, no mechanical ventilation and that quality of life is of key importance. Provided MOST sample, directed to [email or web address] and provided an Out of Facility Order. (Jan '14)     Family History:  The patient's family history includes Bipolar disorder in her daughter; Heart disease in her mother; Pneumonia in her father; Rheumatic fever in her mother.   ROS:   Please see the history of present illness.    ROS All other systems reviewed and are negative.   PHYSICAL EXAM:   VS:  BP (!) 160/54   Pulse 93   Ht 5\' 5"  (1.651 m)   Wt 130 lb (59 kg)   SpO2 94%    BMI 21.63 kg/m    GEN: thin frail female in no acute distress  HEENT: normal  Neck: no JVD, carotid bruits, or masses Cardiac: RRR; no murmurs, rubs, or gallops,no edema  Respiratory:  clear to auscultation bilaterally, normal work of breathing GI: soft, nontender, nondistended, + BS MS: no deformity or atrophy  Skin: warm and dry, no rash Neuro:  Alert and Oriented x 3, Strength and sensation are intact Psych: euthymic mood, full affect  Wt Readings from Last 3 Encounters:  02/02/17 130 lb (59 kg)  01/30/17 133 lb 8 oz (60.6 kg)  01/16/17 133 lb (60.3 kg)      Studies/Labs Reviewed:   EKG:  EKG is ordered today.  The ekg ordered today demonstrates sinus rhythm at rate of 93 bpm with PACs  Recent Labs: 04/11/2016: Brain Natriuretic Peptide 77.9 12/25/2016: Magnesium 2.1; TSH 2.131 01/09/2017: NT-Pro BNP 5,036 01/16/2017: ALT 17; BUN 18.5; Creatinine 0.8; Potassium 3.6; Sodium 141 01/30/2017: HGB 9.1; Platelets 749   Lipid Panel    Component Value Date/Time   CHOL 247 (H) 06/05/2014 0954   TRIG 78.0 06/05/2014 0954   HDL 75.60 06/05/2014 0954   CHOLHDL 3 06/05/2014 0954   VLDL 15.6 06/05/2014 0954   LDLCALC 156 (H) 06/05/2014 0954   LDLDIRECT 157.6 01/16/2012 1709    Additional studies/ records that were reviewed today include:   Echocardiogram: 12/26/16 Study Conclusions  - Left ventricle: The cavity size was normal. There was mild   concentric hypertrophy. Systolic function was normal. The   estimated ejection fraction was in the range of 60% to 65%. Wall   motion was normal; there were no regional wall motion   abnormalities. The study was not technically sufficient to allow   evaluation of LV diastolic dysfunction due to atrial   fibrillation. - Aortic valve: Trileaflet; normal thickness leaflets. There was no   regurgitation. - Aortic root: The aortic root was normal in size. - Right ventricle: The cavity size was normal. Wall thickness was   normal. Systolic  function was normal. - Right atrium: The atrium was normal in size. - Tricuspid valve: There was mild regurgitation. - Pulmonary arteries: Systolic pressure was moderately increased.   PA peak pressure: 49 mm Hg (S). - Systemic veins: Not visualized. - Pericardium, extracardiac: There was no pericardial effusion.    ASSESSMENT & PLAN:    1. Dyspnea on exertion - No evidence of heart failure on exam as well as symptomatic. She denies any orthopnea, PND or lower extremity edema. Recent echocardiogram showed normal LV function. Study was insufficient to evaluate diastolic dysfunction. She uses oxygen at night. Oxygen saturation of 96 at rest. Dropped down to 89% with ambulation less than 1 minute. Advised to use oxygen with activity and she how she does. Seems her symptoms more related to lung disease exacerbated by anemia. No further cardiac testing needed. ? Patient has pulmonologist., will defer to PCP. She was in hurry to leave due to ride issue.   2. MAT - afib never definitively documented. She stopped amiodarone due to skin discoloration. She does not want to restarted it. Not a candidate for anticoagulation.  No arrhythmia and on EKG today.  3. MDS - followed by oncology. Recommended continue tx.   4. HTN - BP of 160/54 with pulse of 93 today. It was 163/65 with pulse of 115 bpm when evaluated by Dr. Burr Medico. Will  Increase Cardizem CD to 300mg  qd.     Medication Adjustments/Labs and Tests Ordered: Current medicines are reviewed at length with the patient today.  Concerns regarding medicines are outlined above.  Medication changes, Labs and Tests ordered today are listed in the Patient Instructions below. There are no Patient Instructions on file for this visit.   Jarrett Soho, [place]  02/02/2017 10:06 AM    [facility] [address], [place], [place]  [address] Phone: [phone]; Fax: [phone]

## 2017-02-02 ENCOUNTER — Ambulatory Visit (INDEPENDENT_AMBULATORY_CARE_PROVIDER_SITE_OTHER): Payer: Medicare Other | Admitting: Physician Assistant

## 2017-02-02 ENCOUNTER — Encounter: Payer: Self-pay | Admitting: Physician Assistant

## 2017-02-02 VITALS — BP 160/54 | HR 93 | Ht 65.0 in | Wt 130.0 lb

## 2017-02-02 DIAGNOSIS — J449 Chronic obstructive pulmonary disease, unspecified: Secondary | ICD-10-CM | POA: Diagnosis not present

## 2017-02-02 DIAGNOSIS — R0609 Other forms of dyspnea: Secondary | ICD-10-CM

## 2017-02-02 DIAGNOSIS — I471 Supraventricular tachycardia: Secondary | ICD-10-CM

## 2017-02-02 DIAGNOSIS — I10 Essential (primary) hypertension: Secondary | ICD-10-CM

## 2017-02-02 DIAGNOSIS — Z9229 Personal history of other drug therapy: Secondary | ICD-10-CM

## 2017-02-02 MED ORDER — DILTIAZEM HCL ER COATED BEADS 300 MG PO CP24: 300.0000 mg | ORAL_CAPSULE | Freq: Every day | ORAL | 3 refills | Status: DC

## 2017-02-02 NOTE — Patient Instructions (Addendum)
Medication Instructions:   START  TAKING CARDIZEM 300 MG ONCE A DAY    If you need a refill on your cardiac medications before your next appointment, please call your pharmacy.  Labwork:NONE ORDERED  TODAY    Testing/Procedures: NONE ORDERED  TODAY    Follow-Up: IN 3 MONTHS WITH LORI GERHARDT    Any Other Special Instructions Will Be Listed Below (If Applicable).

## 2017-02-12 ENCOUNTER — Encounter: Payer: Self-pay | Admitting: Internal Medicine

## 2017-02-14 ENCOUNTER — Ambulatory Visit (HOSPITAL_BASED_OUTPATIENT_CLINIC_OR_DEPARTMENT_OTHER): Payer: Medicare Other

## 2017-02-14 ENCOUNTER — Other Ambulatory Visit (HOSPITAL_BASED_OUTPATIENT_CLINIC_OR_DEPARTMENT_OTHER): Payer: Medicare Other

## 2017-02-14 VITALS — BP 154/68 | HR 96 | Temp 98.0°F | Resp 18

## 2017-02-14 DIAGNOSIS — D469 Myelodysplastic syndrome, unspecified: Secondary | ICD-10-CM

## 2017-02-14 DIAGNOSIS — D519 Vitamin B12 deficiency anemia, unspecified: Secondary | ICD-10-CM

## 2017-02-14 DIAGNOSIS — D61818 Other pancytopenia: Secondary | ICD-10-CM

## 2017-02-14 LAB — CBC & DIFF AND RETIC
BASO%: 0 % (ref 0.0–2.0)
Basophils Absolute: 0 10*3/uL (ref 0.0–0.1)
EOS%: 0.5 % (ref 0.0–7.0)
Eosinophils Absolute: 0 10*3/uL (ref 0.0–0.5)
HCT: 29.5 % — ABNORMAL LOW (ref 34.8–46.6)
HGB: 9.1 g/dL — ABNORMAL LOW (ref 11.6–15.9)
Immature Retic Fract: 1.7 % (ref 1.60–10.00)
LYMPH%: 42.9 % (ref 14.0–49.7)
MCH: 30.5 pg (ref 25.1–34.0)
MCHC: 30.8 g/dL — AB (ref 31.5–36.0)
MCV: 99 fL (ref 79.5–101.0)
MONO#: 0.1 10*3/uL (ref 0.1–0.9)
MONO%: 2.9 % (ref 0.0–14.0)
NEUT%: 53.7 % (ref 38.4–76.8)
NEUTROS ABS: 1.1 10*3/uL — AB (ref 1.5–6.5)
Platelets: 322 10*3/uL (ref 145–400)
RBC: 2.98 10*6/uL — ABNORMAL LOW (ref 3.70–5.45)
RDW: 15.5 % — AB (ref 11.2–14.5)
RETIC %: 0.84 % (ref 0.70–2.10)
Retic Ct Abs: 25.03 10*3/uL — ABNORMAL LOW (ref 33.70–90.70)
WBC: 2.1 10*3/uL — AB (ref 3.9–10.3)
lymph#: 0.9 10*3/uL (ref 0.9–3.3)

## 2017-02-14 MED ORDER — CYANOCOBALAMIN 1000 MCG/ML IJ SOLN
1000.0000 ug | Freq: Once | INTRAMUSCULAR | Status: AC
  Administered 2017-02-14: 1000 ug via INTRAMUSCULAR

## 2017-02-14 MED ORDER — CYANOCOBALAMIN 1000 MCG/ML IJ SOLN
INTRAMUSCULAR | Status: AC
  Filled 2017-02-14: qty 1

## 2017-02-14 MED ORDER — DARBEPOETIN ALFA 150 MCG/0.3ML IJ SOSY
150.0000 ug | PREFILLED_SYRINGE | Freq: Once | INTRAMUSCULAR | Status: AC
  Administered 2017-02-14: 150 ug via SUBCUTANEOUS
  Filled 2017-02-14: qty 0.3

## 2017-02-16 ENCOUNTER — Encounter (HOSPITAL_COMMUNITY): Payer: Self-pay

## 2017-02-16 ENCOUNTER — Emergency Department (HOSPITAL_COMMUNITY)
Admission: EM | Admit: 2017-02-16 | Discharge: 2017-02-16 | Disposition: A | Discharge status: Home / Self Care | Payer: Medicare Other | Attending: Emergency Medicine | Admitting: Emergency Medicine

## 2017-02-16 ENCOUNTER — Other Ambulatory Visit: Payer: Self-pay

## 2017-02-16 DIAGNOSIS — Z7982 Long term (current) use of aspirin: Secondary | ICD-10-CM | POA: Insufficient documentation

## 2017-02-16 DIAGNOSIS — R531 Weakness: Secondary | ICD-10-CM

## 2017-02-16 DIAGNOSIS — Z96651 Presence of right artificial knee joint: Secondary | ICD-10-CM | POA: Insufficient documentation

## 2017-02-16 DIAGNOSIS — N390 Urinary tract infection, site not specified: Secondary | ICD-10-CM | POA: Diagnosis not present

## 2017-02-16 DIAGNOSIS — J449 Chronic obstructive pulmonary disease, unspecified: Secondary | ICD-10-CM | POA: Insufficient documentation

## 2017-02-16 DIAGNOSIS — R55 Syncope and collapse: Secondary | ICD-10-CM | POA: Diagnosis not present

## 2017-02-16 DIAGNOSIS — I10 Essential (primary) hypertension: Secondary | ICD-10-CM | POA: Insufficient documentation

## 2017-02-16 DIAGNOSIS — R404 Transient alteration of awareness: Secondary | ICD-10-CM | POA: Diagnosis not present

## 2017-02-16 DIAGNOSIS — Z87891 Personal history of nicotine dependence: Secondary | ICD-10-CM | POA: Insufficient documentation

## 2017-02-16 DIAGNOSIS — I2510 Atherosclerotic heart disease of native coronary artery without angina pectoris: Secondary | ICD-10-CM | POA: Insufficient documentation

## 2017-02-16 DIAGNOSIS — Z79899 Other long term (current) drug therapy: Secondary | ICD-10-CM | POA: Diagnosis not present

## 2017-02-16 DIAGNOSIS — R5381 Other malaise: Secondary | ICD-10-CM | POA: Diagnosis not present

## 2017-02-16 LAB — URINALYSIS, ROUTINE W REFLEX MICROSCOPIC
BILIRUBIN URINE: NEGATIVE
Glucose, UA: NEGATIVE mg/dL
HGB URINE DIPSTICK: NEGATIVE
KETONES UR: NEGATIVE mg/dL
LEUKOCYTES UA: NEGATIVE
Nitrite: POSITIVE — AB
PROTEIN: NEGATIVE mg/dL
SQUAMOUS EPITHELIAL / LPF: NONE SEEN
Specific Gravity, Urine: 1.008 (ref 1.005–1.030)
pH: 5 (ref 5.0–8.0)

## 2017-02-16 LAB — CBC
HEMATOCRIT: 27.5 % — AB (ref 36.0–46.0)
Hemoglobin: 8.9 g/dL — ABNORMAL LOW (ref 12.0–15.0)
MCH: 31.3 pg (ref 26.0–34.0)
MCHC: 32.4 g/dL (ref 30.0–36.0)
MCV: 96.8 fL (ref 78.0–100.0)
PLATELETS: 307 10*3/uL (ref 150–400)
RBC: 2.84 MIL/uL — AB (ref 3.87–5.11)
RDW: 15.2 % (ref 11.5–15.5)
WBC: 2.9 10*3/uL — AB (ref 4.0–10.5)

## 2017-02-16 LAB — BASIC METABOLIC PANEL
Anion gap: 7 (ref 5–15)
BUN: 20 mg/dL (ref 6–20)
CHLORIDE: 109 mmol/L (ref 101–111)
CO2: 24 mmol/L (ref 22–32)
CREATININE: 0.83 mg/dL (ref 0.44–1.00)
Calcium: 8.7 mg/dL — ABNORMAL LOW (ref 8.9–10.3)
GFR calc Af Amer: >60 mL/min (ref >60)
GFR calc non Af Amer: >60 mL/min (ref >60)
Glucose, Bld: 116 mg/dL — ABNORMAL HIGH (ref 65–99)
POTASSIUM: 3.6 mmol/L (ref 3.5–5.1)
SODIUM: 140 mmol/L (ref 135–145)

## 2017-02-16 LAB — I-STAT TROPONIN, ED: TROPONIN I, POC: 0 ng/mL (ref 0.00–0.08)

## 2017-02-16 LAB — CBG MONITORING, ED: Glucose-Capillary: 122 mg/dL — ABNORMAL HIGH (ref 65–99)

## 2017-02-16 MED ORDER — CEPHALEXIN 500 MG PO CAPS
500.0000 mg | ORAL_CAPSULE | Freq: Once | ORAL | Status: AC
  Administered 2017-02-16: 500 mg via ORAL
  Filled 2017-02-16: qty 1

## 2017-02-16 MED ORDER — CEPHALEXIN 500 MG PO CAPS: 500.0000 mg | ORAL_CAPSULE | Freq: Three times a day (TID) | ORAL | 0 refills | Status: DC

## 2017-02-16 NOTE — ED Notes (Signed)
Pt and caregiver verbalize understanding of DC instructions and follow up care.
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
## 2017-02-16 NOTE — ED Notes (Signed)
Bed: UD43 Expected date:  Expected time:  Means of arrival:  Comments: EMS 83yo syncope
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
## 2017-02-16 NOTE — Discharge Instructions (Signed)
It was our pleasure to provide your ER care today - we hope that you feel better.  Rest. Drink adequate fluids.  For your anemia - follow up with primary care doctor and/or heme/onc specialist.   The lab tests shows a possible urine infection - take antibiotic as prescribed.  Follow up with your doctor in the coming week.  Return to ER if worse, new symptoms, high fevers, fainting, trouble breathing, other concern.

## 2017-02-22 ENCOUNTER — Encounter (HOSPITAL_COMMUNITY): Payer: Self-pay

## 2017-02-22 ENCOUNTER — Emergency Department (HOSPITAL_COMMUNITY): Payer: Medicare Other

## 2017-02-22 ENCOUNTER — Emergency Department (HOSPITAL_COMMUNITY)
Admission: EM | Admit: 2017-02-22 | Discharge: 2017-02-22 | Disposition: A | Discharge status: Home / Self Care | Payer: Medicare Other | Attending: Emergency Medicine | Admitting: Emergency Medicine

## 2017-02-22 ENCOUNTER — Other Ambulatory Visit: Payer: Self-pay

## 2017-02-22 DIAGNOSIS — Z79899 Other long term (current) drug therapy: Secondary | ICD-10-CM | POA: Insufficient documentation

## 2017-02-22 DIAGNOSIS — J810 Acute pulmonary edema: Secondary | ICD-10-CM | POA: Insufficient documentation

## 2017-02-22 DIAGNOSIS — Z7982 Long term (current) use of aspirin: Secondary | ICD-10-CM | POA: Insufficient documentation

## 2017-02-22 DIAGNOSIS — J90 Pleural effusion, not elsewhere classified: Secondary | ICD-10-CM | POA: Diagnosis not present

## 2017-02-22 DIAGNOSIS — C946 Myelodysplastic disease, not classified: Secondary | ICD-10-CM | POA: Diagnosis not present

## 2017-02-22 DIAGNOSIS — J189 Pneumonia, unspecified organism: Secondary | ICD-10-CM | POA: Insufficient documentation

## 2017-02-22 DIAGNOSIS — J441 Chronic obstructive pulmonary disease with (acute) exacerbation: Secondary | ICD-10-CM | POA: Insufficient documentation

## 2017-02-22 DIAGNOSIS — I10 Essential (primary) hypertension: Secondary | ICD-10-CM | POA: Diagnosis not present

## 2017-02-22 DIAGNOSIS — R069 Unspecified abnormalities of breathing: Secondary | ICD-10-CM | POA: Diagnosis not present

## 2017-02-22 DIAGNOSIS — R079 Chest pain, unspecified: Secondary | ICD-10-CM | POA: Diagnosis not present

## 2017-02-22 DIAGNOSIS — I2510 Atherosclerotic heart disease of native coronary artery without angina pectoris: Secondary | ICD-10-CM | POA: Insufficient documentation

## 2017-02-22 DIAGNOSIS — R0602 Shortness of breath: Secondary | ICD-10-CM | POA: Diagnosis not present

## 2017-02-22 DIAGNOSIS — Z96651 Presence of right artificial knee joint: Secondary | ICD-10-CM | POA: Insufficient documentation

## 2017-02-22 DIAGNOSIS — Z87891 Personal history of nicotine dependence: Secondary | ICD-10-CM | POA: Diagnosis not present

## 2017-02-22 DIAGNOSIS — R2681 Unsteadiness on feet: Secondary | ICD-10-CM | POA: Diagnosis not present

## 2017-02-22 LAB — CBC WITH DIFFERENTIAL/PLATELET
BASOS ABS: 0 10*3/uL (ref 0.0–0.1)
BASOS PCT: 0 %
Eosinophils Absolute: 0 10*3/uL (ref 0.0–0.7)
Eosinophils Relative: 0 %
HEMATOCRIT: 28.6 % — AB (ref 36.0–46.0)
HEMOGLOBIN: 9.2 g/dL — AB (ref 12.0–15.0)
Lymphocytes Relative: 20 %
Lymphs Abs: 0.4 10*3/uL — ABNORMAL LOW (ref 0.7–4.0)
MCH: 30.9 pg (ref 26.0–34.0)
MCHC: 32.2 g/dL (ref 30.0–36.0)
MCV: 96 fL (ref 78.0–100.0)
MONOS PCT: 1 %
Monocytes Absolute: 0 10*3/uL — ABNORMAL LOW (ref 0.1–1.0)
NEUTROS ABS: 1.7 10*3/uL (ref 1.7–7.7)
NEUTROS PCT: 79 %
Platelets: 403 10*3/uL — ABNORMAL HIGH (ref 150–400)
RBC: 2.98 MIL/uL — AB (ref 3.87–5.11)
RDW: 15.9 % — ABNORMAL HIGH (ref 11.5–15.5)
WBC: 2.1 10*3/uL — ABNORMAL LOW (ref 4.0–10.5)

## 2017-02-22 LAB — COMPREHENSIVE METABOLIC PANEL
ALT: 14 U/L (ref 14–54)
AST: 18 U/L (ref 15–41)
Albumin: 4 g/dL (ref 3.5–5.0)
Alkaline Phosphatase: 72 U/L (ref 38–126)
Anion gap: 11 (ref 5–15)
BUN: 18 mg/dL (ref 6–20)
CHLORIDE: 104 mmol/L (ref 101–111)
CO2: 22 mmol/L (ref 22–32)
CREATININE: 0.74 mg/dL (ref 0.44–1.00)
Calcium: 8.9 mg/dL (ref 8.9–10.3)
GFR calc Af Amer: >60 mL/min (ref >60)
GFR calc non Af Amer: >60 mL/min (ref >60)
Glucose, Bld: 127 mg/dL — ABNORMAL HIGH (ref 65–99)
Potassium: 4 mmol/L (ref 3.5–5.1)
SODIUM: 137 mmol/L (ref 135–145)
Total Bilirubin: 1 mg/dL (ref 0.3–1.2)
Total Protein: 7 g/dL (ref 6.5–8.1)

## 2017-02-22 LAB — TROPONIN I: Troponin I: 0.03 ng/mL (ref <0.03)

## 2017-02-22 LAB — BRAIN NATRIURETIC PEPTIDE: B NATRIURETIC PEPTIDE 5: 311.8 pg/mL — AB (ref 0.0–100.0)

## 2017-02-22 MED ORDER — METHYLPREDNISOLONE SODIUM SUCC 125 MG IJ SOLR
125.0000 mg | Freq: Once | INTRAMUSCULAR | Status: AC
  Administered 2017-02-22: 125 mg via INTRAVENOUS
  Filled 2017-02-22: qty 2

## 2017-02-22 MED ORDER — CEPHALEXIN 500 MG PO CAPS: 500.0000 mg | ORAL_CAPSULE | Freq: Four times a day (QID) | ORAL | 0 refills | Status: DC

## 2017-02-22 MED ORDER — DEXTROSE 5 % IV SOLN
1.0000 g | Freq: Once | INTRAVENOUS | Status: AC
  Administered 2017-02-22: 1 g via INTRAVENOUS
  Filled 2017-02-22: qty 10

## 2017-02-22 MED ORDER — PREDNISONE 10 MG (21) PO TBPK
ORAL_TABLET | ORAL | 0 refills | Status: DC
Start: 2017-02-22 — End: 2017-03-07

## 2017-02-22 NOTE — ED Notes (Signed)
Pt ambulated in hallway without oxygen. O2 levels remained constant 91-92

## 2017-02-22 NOTE — ED Triage Notes (Signed)
PT RECEIVED FROM HER PCP'S OFFICE FOR A POSITIVE CXR FOR PNEUMONIA. PER EMS, THE PT WAS SEEN BY HER PCP FOR AN EPISODE OF SOB AT 0600 AND CHEST PAIN. PER EMS, THE EKG WAS WNL AND SHE WAS GIVEN ASA 324MG  PTA.
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
## 2017-02-22 NOTE — ED Notes (Signed)
EKG given to EDP,[place],.MD., for review. 

## 2017-02-27 ENCOUNTER — Other Ambulatory Visit: Payer: Self-pay | Admitting: Hematology

## 2017-02-27 ENCOUNTER — Other Ambulatory Visit (HOSPITAL_BASED_OUTPATIENT_CLINIC_OR_DEPARTMENT_OTHER): Payer: Medicare Other

## 2017-02-27 ENCOUNTER — Ambulatory Visit (HOSPITAL_BASED_OUTPATIENT_CLINIC_OR_DEPARTMENT_OTHER): Payer: Medicare Other

## 2017-02-27 VITALS — BP 129/66 | HR 90 | Temp 97.5°F | Resp 22

## 2017-02-27 DIAGNOSIS — D519 Vitamin B12 deficiency anemia, unspecified: Secondary | ICD-10-CM

## 2017-02-27 DIAGNOSIS — D61818 Other pancytopenia: Secondary | ICD-10-CM | POA: Diagnosis not present

## 2017-02-27 DIAGNOSIS — D469 Myelodysplastic syndrome, unspecified: Secondary | ICD-10-CM | POA: Diagnosis not present

## 2017-02-27 LAB — CBC & DIFF AND RETIC
BASO%: 0.2 % (ref 0.0–2.0)
BASOS ABS: 0 10*3/uL (ref 0.0–0.1)
EOS ABS: 0 10*3/uL (ref 0.0–0.5)
EOS%: 0 % (ref 0.0–7.0)
HEMATOCRIT: 30.5 % — AB (ref 34.8–46.6)
HEMOGLOBIN: 9.7 g/dL — AB (ref 11.6–15.9)
Immature Retic Fract: 11.4 % — ABNORMAL HIGH (ref 1.60–10.00)
LYMPH%: 35.5 % (ref 14.0–49.7)
MCH: 30.5 pg (ref 25.1–34.0)
MCHC: 31.8 g/dL (ref 31.5–36.0)
MCV: 95.9 fL (ref 79.5–101.0)
MONO#: 0.1 10*3/uL (ref 0.1–0.9)
MONO%: 1.3 % (ref 0.0–14.0)
NEUT#: 3 10*3/uL (ref 1.5–6.5)
NEUT%: 63 % (ref 38.4–76.8)
Platelets: 348 10*3/uL (ref 145–400)
RBC: 3.18 10*6/uL — ABNORMAL LOW (ref 3.70–5.45)
RDW: 15.7 % — ABNORMAL HIGH (ref 11.2–14.5)
Retic %: 2.95 % — ABNORMAL HIGH (ref 0.70–2.10)
Retic Ct Abs: 93.81 10*3/uL — ABNORMAL HIGH (ref 33.70–90.70)
WBC: 4.7 10*3/uL (ref 3.9–10.3)
lymph#: 1.7 10*3/uL (ref 0.9–3.3)

## 2017-02-27 LAB — TECHNOLOGIST REVIEW

## 2017-02-27 MED ORDER — DARBEPOETIN ALFA 150 MCG/0.3ML IJ SOSY
150.0000 ug | PREFILLED_SYRINGE | Freq: Once | INTRAMUSCULAR | Status: AC
  Administered 2017-02-27: 150 ug via SUBCUTANEOUS
  Filled 2017-02-27: qty 0.3

## 2017-02-27 NOTE — Patient Instructions (Signed)

## 2017-03-02 ENCOUNTER — Other Ambulatory Visit: Payer: Self-pay | Admitting: Hematology

## 2017-03-02 ENCOUNTER — Other Ambulatory Visit: Payer: Self-pay

## 2017-03-02 ENCOUNTER — Emergency Department (HOSPITAL_COMMUNITY): Payer: Medicare Other

## 2017-03-02 ENCOUNTER — Encounter (HOSPITAL_COMMUNITY): Payer: Self-pay | Admitting: Emergency Medicine

## 2017-03-02 ENCOUNTER — Inpatient Hospital Stay (HOSPITAL_COMMUNITY)
Admission: EM | Admit: 2017-03-02 | Discharge: 2017-03-07 | DRG: 309 | Disposition: A | Discharge status: Skilled Nursing Facility | Payer: Medicare Other | Attending: Internal Medicine | Admitting: Internal Medicine

## 2017-03-02 DIAGNOSIS — Z881 Allergy status to other antibiotic agents status: Secondary | ICD-10-CM

## 2017-03-02 DIAGNOSIS — I700 Atherosclerosis of aorta: Secondary | ICD-10-CM | POA: Diagnosis present

## 2017-03-02 DIAGNOSIS — Z885 Allergy status to narcotic agent status: Secondary | ICD-10-CM

## 2017-03-02 DIAGNOSIS — R55 Syncope and collapse: Secondary | ICD-10-CM | POA: Diagnosis not present

## 2017-03-02 DIAGNOSIS — Z96651 Presence of right artificial knee joint: Secondary | ICD-10-CM | POA: Diagnosis not present

## 2017-03-02 DIAGNOSIS — I272 Pulmonary hypertension, unspecified: Secondary | ICD-10-CM

## 2017-03-02 DIAGNOSIS — Z7952 Long term (current) use of systemic steroids: Secondary | ICD-10-CM | POA: Diagnosis not present

## 2017-03-02 DIAGNOSIS — E876 Hypokalemia: Secondary | ICD-10-CM | POA: Diagnosis not present

## 2017-03-02 DIAGNOSIS — I110 Hypertensive heart disease with heart failure: Secondary | ICD-10-CM | POA: Diagnosis not present

## 2017-03-02 DIAGNOSIS — Z880 Allergy status to penicillin: Secondary | ICD-10-CM

## 2017-03-02 DIAGNOSIS — R000 Tachycardia, unspecified: Secondary | ICD-10-CM | POA: Diagnosis not present

## 2017-03-02 DIAGNOSIS — J449 Chronic obstructive pulmonary disease, unspecified: Secondary | ICD-10-CM | POA: Diagnosis not present

## 2017-03-02 DIAGNOSIS — J309 Allergic rhinitis, unspecified: Secondary | ICD-10-CM | POA: Diagnosis not present

## 2017-03-02 DIAGNOSIS — Z87891 Personal history of nicotine dependence: Secondary | ICD-10-CM

## 2017-03-02 DIAGNOSIS — K589 Irritable bowel syndrome without diarrhea: Secondary | ICD-10-CM | POA: Diagnosis not present

## 2017-03-02 DIAGNOSIS — Z9114 Patient's other noncompliance with medication regimen: Secondary | ICD-10-CM

## 2017-03-02 DIAGNOSIS — J189 Pneumonia, unspecified organism: Secondary | ICD-10-CM | POA: Diagnosis not present

## 2017-03-02 DIAGNOSIS — Z79899 Other long term (current) drug therapy: Secondary | ICD-10-CM | POA: Diagnosis not present

## 2017-03-02 DIAGNOSIS — I10 Essential (primary) hypertension: Secondary | ICD-10-CM | POA: Diagnosis not present

## 2017-03-02 DIAGNOSIS — R0682 Tachypnea, not elsewhere classified: Secondary | ICD-10-CM

## 2017-03-02 DIAGNOSIS — Z66 Do not resuscitate: Secondary | ICD-10-CM | POA: Diagnosis not present

## 2017-03-02 DIAGNOSIS — R0602 Shortness of breath: Secondary | ICD-10-CM | POA: Diagnosis not present

## 2017-03-02 DIAGNOSIS — J441 Chronic obstructive pulmonary disease with (acute) exacerbation: Secondary | ICD-10-CM | POA: Diagnosis not present

## 2017-03-02 DIAGNOSIS — I471 Supraventricular tachycardia: Secondary | ICD-10-CM

## 2017-03-02 DIAGNOSIS — Z888 Allergy status to other drugs, medicaments and biological substances status: Secondary | ICD-10-CM | POA: Diagnosis not present

## 2017-03-02 DIAGNOSIS — I4891 Unspecified atrial fibrillation: Principal | ICD-10-CM | POA: Diagnosis present

## 2017-03-02 DIAGNOSIS — J90 Pleural effusion, not elsewhere classified: Secondary | ICD-10-CM | POA: Diagnosis not present

## 2017-03-02 DIAGNOSIS — I499 Cardiac arrhythmia, unspecified: Secondary | ICD-10-CM | POA: Diagnosis not present

## 2017-03-02 DIAGNOSIS — D469 Myelodysplastic syndrome, unspecified: Secondary | ICD-10-CM | POA: Diagnosis not present

## 2017-03-02 DIAGNOSIS — I2510 Atherosclerotic heart disease of native coronary artery without angina pectoris: Secondary | ICD-10-CM | POA: Diagnosis present

## 2017-03-02 DIAGNOSIS — C946 Myelodysplastic disease, not classified: Secondary | ICD-10-CM | POA: Diagnosis present

## 2017-03-02 DIAGNOSIS — R05 Cough: Secondary | ICD-10-CM | POA: Diagnosis not present

## 2017-03-02 DIAGNOSIS — D508 Other iron deficiency anemias: Secondary | ICD-10-CM | POA: Diagnosis not present

## 2017-03-02 DIAGNOSIS — F419 Anxiety disorder, unspecified: Secondary | ICD-10-CM

## 2017-03-02 DIAGNOSIS — R5383 Other fatigue: Secondary | ICD-10-CM | POA: Diagnosis not present

## 2017-03-02 DIAGNOSIS — J45998 Other asthma: Secondary | ICD-10-CM | POA: Diagnosis not present

## 2017-03-02 DIAGNOSIS — Z882 Allergy status to sulfonamides status: Secondary | ICD-10-CM | POA: Diagnosis not present

## 2017-03-02 DIAGNOSIS — I5032 Chronic diastolic (congestive) heart failure: Secondary | ICD-10-CM | POA: Diagnosis not present

## 2017-03-02 DIAGNOSIS — Z91011 Allergy to milk products: Secondary | ICD-10-CM | POA: Diagnosis not present

## 2017-03-02 DIAGNOSIS — K5730 Diverticulosis of large intestine without perforation or abscess without bleeding: Secondary | ICD-10-CM | POA: Diagnosis present

## 2017-03-02 DIAGNOSIS — R0609 Other forms of dyspnea: Secondary | ICD-10-CM | POA: Diagnosis not present

## 2017-03-02 LAB — CBC WITH DIFFERENTIAL/PLATELET
BASOS PCT: 1 %
Basophils Absolute: 0.1 10*3/uL (ref 0.0–0.1)
Eosinophils Absolute: 0 10*3/uL (ref 0.0–0.7)
Eosinophils Relative: 0 %
HEMATOCRIT: 31.5 % — AB (ref 36.0–46.0)
HEMOGLOBIN: 9.8 g/dL — AB (ref 12.0–15.0)
LYMPHS PCT: 31 %
Lymphs Abs: 1.8 10*3/uL (ref 0.7–4.0)
MCH: 30.1 pg (ref 26.0–34.0)
MCHC: 31.1 g/dL (ref 30.0–36.0)
MCV: 96.6 fL (ref 78.0–100.0)
Monocytes Absolute: 0.2 10*3/uL (ref 0.1–1.0)
Monocytes Relative: 3 %
NEUTROS ABS: 3.8 10*3/uL (ref 1.7–7.7)
Neutrophils Relative %: 65 %
Platelets: 292 10*3/uL (ref 150–400)
RBC: 3.26 MIL/uL — ABNORMAL LOW (ref 3.87–5.11)
RDW: 16.6 % — ABNORMAL HIGH (ref 11.5–15.5)
WBC: 5.9 10*3/uL (ref 4.0–10.5)

## 2017-03-02 LAB — COMPREHENSIVE METABOLIC PANEL
ALBUMIN: 3.7 g/dL (ref 3.5–5.0)
ALK PHOS: 65 U/L (ref 38–126)
ALT: 23 U/L (ref 14–54)
ANION GAP: 7 (ref 5–15)
AST: 23 U/L (ref 15–41)
BUN: 24 mg/dL — ABNORMAL HIGH (ref 6–20)
CALCIUM: 8.4 mg/dL — AB (ref 8.9–10.3)
CO2: 23 mmol/L (ref 22–32)
Chloride: 108 mmol/L (ref 101–111)
Creatinine, Ser: 0.81 mg/dL (ref 0.44–1.00)
GFR calc Af Amer: >60 mL/min (ref >60)
GFR calc non Af Amer: >60 mL/min (ref >60)
GLUCOSE: 126 mg/dL — AB (ref 65–99)
POTASSIUM: 3.3 mmol/L — AB (ref 3.5–5.1)
SODIUM: 138 mmol/L (ref 135–145)
Total Bilirubin: 0.7 mg/dL (ref 0.3–1.2)
Total Protein: 6 g/dL — ABNORMAL LOW (ref 6.5–8.1)

## 2017-03-02 LAB — T4, FREE: FREE T4: 1.15 ng/dL — AB (ref 0.61–1.12)

## 2017-03-02 LAB — BRAIN NATRIURETIC PEPTIDE: B Natriuretic Peptide: 458.3 pg/mL — ABNORMAL HIGH (ref 0.0–100.0)

## 2017-03-02 LAB — MAGNESIUM: Magnesium: 2 mg/dL (ref 1.7–2.4)

## 2017-03-02 LAB — TSH: TSH: 2.712 u[IU]/mL (ref 0.350–4.500)

## 2017-03-02 MED ORDER — METOPROLOL TARTRATE 25 MG PO TABS
25.0000 mg | ORAL_TABLET | Freq: Two times a day (BID) | ORAL | Status: DC
  Administered 2017-03-02: 25 mg via ORAL
  Filled 2017-03-02: qty 1

## 2017-03-02 MED ORDER — MOMETASONE FURO-FORMOTEROL FUM 200-5 MCG/ACT IN AERO
2.0000 | INHALATION_SPRAY | Freq: Two times a day (BID) | RESPIRATORY_TRACT | Status: DC
  Administered 2017-03-03 – 2017-03-07 (×8): 2 via RESPIRATORY_TRACT
  Filled 2017-03-02 (×2): qty 8.8

## 2017-03-02 MED ORDER — SODIUM CHLORIDE 0.9 % IV SOLN: INTRAVENOUS | Status: DC

## 2017-03-02 MED ORDER — LORAZEPAM 2 MG/ML IJ SOLN
0.5000 mg | Freq: Once | INTRAMUSCULAR | Status: AC
  Administered 2017-03-02: 0.5 mg via INTRAVENOUS
  Filled 2017-03-02: qty 1

## 2017-03-02 MED ORDER — SODIUM CHLORIDE 0.9 % IV BOLUS (SEPSIS)
250.0000 mL | Freq: Once | INTRAVENOUS | Status: AC
  Administered 2017-03-02: 250 mL via INTRAVENOUS

## 2017-03-02 MED ORDER — LATANOPROST 0.005 % OP SOLN
1.0000 [drp] | Freq: Every day | OPHTHALMIC | Status: DC
  Administered 2017-03-02 – 2017-03-06 (×5): 1 [drp] via OPHTHALMIC
  Filled 2017-03-02: qty 2.5

## 2017-03-02 MED ORDER — ENOXAPARIN SODIUM 40 MG/0.4ML ~~LOC~~ SOLN
40.0000 mg | SUBCUTANEOUS | Status: DC
  Administered 2017-03-02 – 2017-03-06 (×5): 40 mg via SUBCUTANEOUS
  Filled 2017-03-02 (×5): qty 0.4

## 2017-03-02 MED ORDER — POLYVINYL ALCOHOL 1.4 % OP SOLN
1.0000 [drp] | Freq: Three times a day (TID) | OPHTHALMIC | Status: DC
  Administered 2017-03-02 – 2017-03-07 (×13): 1 [drp] via OPHTHALMIC
  Filled 2017-03-02: qty 15

## 2017-03-02 MED ORDER — ASPIRIN EC 81 MG PO TBEC
81.0000 mg | DELAYED_RELEASE_TABLET | Freq: Every day | ORAL | Status: DC
  Administered 2017-03-03 – 2017-03-07 (×5): 81 mg via ORAL
  Filled 2017-03-02 (×5): qty 1

## 2017-03-02 MED ORDER — DEXTROSE 5 % IV SOLN
5.0000 mg/h | INTRAVENOUS | Status: DC
  Administered 2017-03-02: 10 mg/h via INTRAVENOUS
  Administered 2017-03-02 – 2017-03-03 (×2): 15 mg/h via INTRAVENOUS
  Filled 2017-03-02 (×3): qty 100

## 2017-03-02 MED ORDER — METOPROLOL TARTRATE 5 MG/5ML IV SOLN: 2.5000 mg | Freq: Once | INTRAVENOUS | Status: DC

## 2017-03-02 MED ORDER — FUROSEMIDE 20 MG PO TABS
20.0000 mg | ORAL_TABLET | Freq: Every day | ORAL | Status: DC
Start: 2017-03-03 — End: 2017-03-07
  Administered 2017-03-03 – 2017-03-07 (×5): 20 mg via ORAL
  Filled 2017-03-02 (×5): qty 1

## 2017-03-02 MED ORDER — DILTIAZEM LOAD VIA INFUSION
10.0000 mg | Freq: Once | INTRAVENOUS | Status: AC
  Administered 2017-03-02: 10 mg via INTRAVENOUS
  Filled 2017-03-02: qty 10

## 2017-03-02 MED ORDER — DIGOXIN 0.25 MG/ML IJ SOLN: 0.5000 mg | Freq: Once | INTRAMUSCULAR | Status: DC

## 2017-03-02 MED ORDER — ACETAMINOPHEN 325 MG PO TABS
650.0000 mg | ORAL_TABLET | ORAL | Status: DC | PRN
  Administered 2017-03-04 – 2017-03-07 (×3): 650 mg via ORAL
  Filled 2017-03-02 (×3): qty 2

## 2017-03-02 MED ORDER — CLONAZEPAM 0.5 MG PO TABS
0.2500 mg | ORAL_TABLET | Freq: Three times a day (TID) | ORAL | Status: DC | PRN
  Administered 2017-03-03 – 2017-03-07 (×6): 0.25 mg via ORAL
  Filled 2017-03-02 (×6): qty 1

## 2017-03-02 MED ORDER — ONDANSETRON HCL 4 MG/2ML IJ SOLN
4.0000 mg | Freq: Four times a day (QID) | INTRAMUSCULAR | Status: DC | PRN
  Administered 2017-03-04: 4 mg via INTRAVENOUS
  Filled 2017-03-02: qty 2

## 2017-03-02 MED ORDER — METOPROLOL TARTRATE 5 MG/5ML IV SOLN: 2.5000 mg | INTRAVENOUS | Status: AC

## 2017-03-02 MED ORDER — POTASSIUM CHLORIDE CRYS ER 20 MEQ PO TBCR
40.0000 meq | EXTENDED_RELEASE_TABLET | Freq: Once | ORAL | Status: AC
  Administered 2017-03-02: 40 meq via ORAL
  Filled 2017-03-02: qty 2

## 2017-03-02 MED ORDER — ZOLPIDEM TARTRATE 5 MG PO TABS
5.0000 mg | ORAL_TABLET | Freq: Every evening | ORAL | Status: DC | PRN
  Administered 2017-03-02 – 2017-03-06 (×4): 5 mg via ORAL
  Filled 2017-03-02 (×4): qty 1

## 2017-03-02 NOTE — ED Triage Notes (Signed)
Pt here from [facility] in [place] RVR 150's , no chest pain or sob , pt received 200 of fluids from EMS , no meds
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
## 2017-03-02 NOTE — ED Notes (Signed)
Report attempted 

## 2017-03-02 NOTE — H&P (Signed)
History and Physical    Lori [name] ALP:[id number] DOB: 1926/06/18 DOA: 03/02/2017  PCP: Leanna Battles, MD   Patient coming from: Home  Chief Complaint: Palpitations, shortness of breath  HPI: Lori [name] is a 83 y.o. woman with a history of HTN, diastolic CHF, CAD, COPD, remote tobacco use, recent diagnosis of MDS/MPN (she is on Anagrelide; followed by Dr. Burr Medico) who presents to the ED from [facility] for evaluation of palpitations and shortness of breath, onset earlier today.  She denies associated chest pain.  No nausea.  She has increased edema in her legs.  She reports that she has just finished a course of antibiotic for "double pneumonia", so she thought her breathing would be better, not worse. She also reports three syncopal episodes in the past two weeks.  ED Course: The patient was found to be in atrial fibrillation with RVR.  Cardiology was asked to see the patient in the ED.  Recommendations noted.  Hospitalist asked to admit.  At the time of my evaluation, she remained tachycardic with heart rates near 150 despite being on max dose cardizem infusion.  Review of Systems: As per HPI otherwise 10 point review of systems negative.    Past Medical History:  Diagnosis Date  . ALLERGIC RHINITIS   . Anxiety   . Asthma   . Atrial fibrillation ([place])   . Back pain of thoracolumbar region    right  . Bronchiectasis   . Colles' fracture of left radius   . COPD (chronic obstructive pulmonary disease) ([place])   . Coronary artery disease   . Diastolic dysfunction   . Diverticulosis   . Dysrhythmia   . History of pneumonia   . Hx of cardiovascular stress test    a. ETT-MV 1/14: Exercised 4:16, no ECG changes, poor ex tol, ant defect c/w soft tissue atten, no ischemia, EF 75%  . Hx of colonic polyp   . Hypertension   . IBS (irritable bowel syndrome)   . Insomnia   . Multiple rib fractures 10/2008   bilateral  . Pleural effusion, left   . Pruritus   . Pulmonary nodule   .  Syncope     Past Surgical History:  Procedure Laterality Date  . ANKLE CLOSED REDUCTION Right 12/11/2014   Procedure: CLOSED REDUCTION ANKLE;  Surgeon: Rod Can, MD;  Location: [place];  Service: Orthopedics;  Laterality: Right;  . APPENDECTOMY    . BLADDER SUSPENSION  2005   A-P  . CHOLECYSTECTOMY    . COLONOSCOPY  04/06/2012   Procedure: COLONOSCOPY;  Surgeon: Jerene Bears, MD;  Location: [facility];  Service: Gastroenterology;  Laterality: N/A;  . EXTERNAL FIXATION LEG Right 12/12/2014   Procedure: ADJUSTMENT OF EXTERNAL FIXATION  RIGHT ANKLE;  Surgeon: Rod Can, MD;  Location: [place];  Service: Orthopedics;  Laterality: Right;  . EXTERNAL FIXATION LEG Right 12/11/2014   Procedure: POSSIBLE EXTERNAL FIXATION RIGHT ANKLE;  Surgeon: Rod Can, MD;  Location: [place];  Service: Orthopedics;  Laterality: Right;  . HARDWARE REMOVAL Right 12/25/2014   Procedure: RIGHT ANKLE REMOVE OF DEEP IMPLANT ANE EXTERNAL FIXATOR ;  Surgeon: [name], MD;  Location: [place];  Service: Orthopedics;  Laterality: Right;  . ORIF ANKLE FRACTURE Right 12/25/2014   Procedure: OPEN REDUCTION INTERNAL FIXATION (ORIF)TRIMALLEOLAR  ANKLE FRACTURE;  Surgeon: [name], MD;  Location: [place];  Service: Orthopedics;  Laterality: Right;  . ORIF WRIST FRACTURE Left 2010     Dr.  Weingold.  Marland Kitchen POLYPECTOMY    . ROTATOR CUFF REPAIR Right 2006  . TONSILLECTOMY    . TONSILLECTOMY    . TOTAL KNEE ARTHROPLASTY Right 08/2010     reports that she quit smoking about 68 years ago. Her smoking use included Cigarettes. She has a 3.00 pack-year smoking history. She has never used smokeless tobacco. She reports that she does not drink alcohol or use drugs.  Allergies  Allergen Reactions  . Albuterol Sulfate Palpitations  . Levaquin [Levofloxacin In D5w] Other (See Comments)    QT prolongation. Should avoid all flouroquinolones.   . Doxycycline Diarrhea  . Lactose Intolerance (Gi)  Other (See Comments)  . Amoxicillin Other (See Comments)    REACTION: unspecified  . Codeine Other (See Comments)    Can take Hydrocodone  . Penicillins Hives, Itching and Rash    Hard lump and rash Has patient had a PCN reaction causing immediate rash, facial/tongue/throat swelling, SOB or lightheadedness with hypotension: Yes Has patient had a PCN reaction causing severe rash involving mucus membranes or skin necrosis: No Has patient had a PCN reaction that required hospitalization unknown Has patient had a PCN reaction occurring within the last 10 years: No If all of the above answers are "NO", then may proceed with Cephalosporin use.  . Sulfa Antibiotics Nausea Only  . Sulfonamide Derivatives Nausea Only    Family History  Problem Relation Age of Onset  . Heart disease Mother   . Rheumatic fever Mother   . Bipolar disorder Daughter   . Pneumonia Father   . Colon cancer Neg Hx      Prior to Admission medications   Medication Sig Start Date End Date Taking? Authorizing Provider  acetaminophen (TYLENOL) 500 MG tablet Take 1,000 mg by mouth every 6 (six) hours as needed for headache (pain). Not to exceed 3000mg  daily   Yes [provider]  anagrelide (AGRYLIN) 1 MG capsule Take 2 tab in the morning, and 1 tab in everning 01/30/17  Yes Truitt Merle, MD  aspirin EC 81 MG tablet Take 1 tablet (81 mg total) by mouth daily. 06/02/15  Yes Burtis Junes, NP  bimatoprost (LUMIGAN) 0.01 % SOLN Place 1 drop into both eyes at bedtime. 01/10/17  Yes Reed, Tiffany L, DO  budesonide-formoterol (SYMBICORT) 160-4.5 MCG/ACT inhaler Inhale 2 puffs into the lungs 2 (two) times daily. 01/10/17  Yes Reed, Tiffany L, DO  cephALEXin (KEFLEX) 500 MG capsule Take 1 capsule (500 mg total) by mouth 4 (four) times daily. 02/22/17  Yes Isla Pence, MD  clonazePAM (KLONOPIN) 0.5 MG tablet Take 0.5 tablets (0.25 mg total) by mouth 3 (three) times daily as needed (anxiety). 12/27/16  Yes Geradine Girt, DO    dicyclomine (BENTYL) 10 MG capsule Take 1 capsule (10 mg total) by mouth 3 (three) times daily before meals. IBS 01/10/17  Yes Reed, Tiffany L, DO  diltiazem (CARDIZEM CD) 300 MG 24 hr capsule Take 1 capsule (300 mg total) by mouth daily. 02/02/17  Yes Bhagat, Bhavinkumar, PA  furosemide (LASIX) 20 MG tablet Take 1 tablet (20 mg total) by mouth daily. Take (40 mg ) for 3 days starting 4/24 - 426 than (20 mg ) daily Patient taking differently: Take 20 mg by mouth daily.  01/10/17 04/10/17 Yes Burtis Junes, NP  loperamide (IMODIUM) 2 MG capsule Take 2 mg by mouth as needed for diarrhea or loose stools.    Yes [provider]  magnesium oxide (MAG-OX) 400 MG tablet Take  400 mg by mouth daily as needed (leg cramps).   Yes [provider]  Multiple Vitamin (MULTIVITAMIN WITH MINERALS) TABS tablet Take 1 tablet by mouth daily.   Yes [provider]  nebivolol (BYSTOLIC) 2.5 MG tablet Take 1 tablet (2.5 mg total) by mouth daily. 01/09/17  Yes Reed, Tiffany L, DO  polyvinyl alcohol (LIQUIFILM TEARS) 1.4 % ophthalmic solution Place 1 drop into both eyes 3 (three) times daily.   Yes [provider]  zolpidem (AMBIEN) 5 MG tablet Take 1 tablet (5 mg total) by mouth at bedtime as needed for sleep. 06/10/15  Yes Larey Dresser, MD  predniSONE (STERAPRED UNI-PAK 21 TAB) 10 MG (21) TBPK tablet Take 6 tabs by mouth daily  for 2 days, then 5 tabs for 2 days, then 4 tabs for 2 days, then 3 tabs for 2 days, 2 tabs for 2 days, then 1 tab by mouth daily for 2 days Patient not taking: Reported on 03/02/2017 02/22/17   Isla Pence, MD    Physical Exam: Vitals:   03/02/17 1725 03/02/17 1900 03/02/17 1915 03/02/17 1930  BP: (!) 123/92 (!) 134/92 133/86 112/67  Pulse: (!) 152 (!) 152 (!) 150 (!) 153  Resp: (!) 26 (!) 31 (!) 25 (!) 40  Temp:      TempSrc:      SpO2: 97% 95% 95% 96%  Weight:      Height:          Constitutional: appears anxious and short of breath, keeps  saying "there's nothing you can do but keep me comfortable"; she is upset that her family is not here. Vitals:   03/02/17 1725 03/02/17 1900 03/02/17 1915 03/02/17 1930  BP: (!) 123/92 (!) 134/92 133/86 112/67  Pulse: (!) 152 (!) 152 (!) 150 (!) 153  Resp: (!) 26 (!) 31 (!) 25 (!) 40  Temp:      TempSrc:      SpO2: 97% 95% 95% 96%  Weight:      Height:       Eyes: PERRL, lids and conjunctivae normal ENMT: Mucous membranes are moist. Posterior pharynx clear of any exudate or lesions. Normal dentition.  Neck: normal appearance, supple, no masses Respiratory: clear to auscultation bilaterally, no wheezing, no crackles. She has tachypnea with a prolonged expiratory phase, but she is not using accessory muscles.    Cardiovascular: Tachycardic and irregular.  No murmurs / rubs / gallops. Bilateral lower extremity edema.  2+ pedal pulses. GI: abdomen is soft and compressible.  No distention.  No tenderness.  Bowel sounds are present. Musculoskeletal:  No joint deformity in upper and lower extremities. Good ROM, no contractures. Normal muscle tone.  Skin: warm, dry, extensive bruising in her extremities. Neurologic: No focal deficits. Psychiatric: Normal judgment and insight. Alert and oriented x 3. Anxious and upset.    Labs on Admission: I have personally reviewed following labs and imaging studies  CBC:  Recent Labs Lab 02/27/17 1237 03/02/17 1504  WBC 4.7 5.9  NEUTROABS 3.0 3.8  HGB 9.7* 9.8*  HCT 30.5* 31.5*  MCV 95.9 96.6  PLT 348 350   Basic Metabolic Panel:  Recent Labs Lab 03/02/17 1504  NA 138  K 3.3*  CL 108  CO2 23  GLUCOSE 126*  BUN 24*  CREATININE 0.81  CALCIUM 8.4*  MG 2.0   GFR: Estimated Creatinine Clearance: 49.3 mL/min (by C-G formula based on SCr of 0.81 mg/dL). Liver Function Tests:  Recent Labs Lab 03/02/17 1504  AST 23  ALT 23  ALKPHOS 65  BILITOT 0.7  PROT 6.0*  ALBUMIN 3.7   BNP (last 3 results)  Recent Labs  09/28/16 0953  01/09/17 1522  PROBNP 502 5,036*   Urine analysis:    Component Value Date/Time   COLORURINE YELLOW 02/16/2017 1332   APPEARANCEUR CLEAR 02/16/2017 1332   LABSPEC 1.008 02/16/2017 1332   PHURINE 5.0 02/16/2017 1332   GLUCOSEU NEGATIVE 02/16/2017 1332   GLUCOSEU NEGATIVE 12/05/2014 1220   HGBUR NEGATIVE 02/16/2017 1332   BILIRUBINUR NEGATIVE 02/16/2017 1332   KETONESUR NEGATIVE 02/16/2017 1332   PROTEINUR NEGATIVE 02/16/2017 1332   UROBILINOGEN 0.2 03/06/2015 1135   NITRITE POSITIVE (A) 02/16/2017 1332   LEUKOCYTESUR NEGATIVE 02/16/2017 1332    Radiological Exams on Admission: Dg Chest Port 1 View  Result Date: 03/02/2017 CLINICAL DATA:  Shortness of breath tachycardia and productive cough. EXAM: PORTABLE CHEST 1 VIEW COMPARISON:  February 22, 2017. FINDINGS: The heart size and mediastinal contours are stable. The heart size is mildly enlarged. There is minimal increased pulmonary interstitium. There is no focal pneumonia or pleural effusion. The visualized skeletal structures are stable. IMPRESSION: Mild cardiomegaly. Minimal increased pulmonary interstitium unchanged. Electronically Signed   By: Abelardo Diesel M.D.   On: 03/02/2017 15:23    EKG: Independently reviewed. Atrial fibrillation with RVR vs multifocal atrial tachycardia.  Assessment/Plan Active Problems:   Essential hypertension   Chronic obstructive pulmonary disease (HCC)   MDS/MPN (myelodysplastic/myeloproliferative neoplasms) (HCC)   Syncope   Tachypnea   Anxiety   Hypokalemia      Atrial fibrillation with RVR, recurrent (CHADS-Vasc score of at least 5; she was not deemed to be a candidate for long-term anticoagulation in the past). --IV cardizem infusion; holding oral cardizem for now --Start lopressor 25mg  po BID (she never received IV lopressor as ordered in the ED); hold Bystolic --Check TFTs --It has been previously documented that she is not a candidate for anticoagulation.  Defer heparin infusion for  now. --No troponin per cardiology recommendations.  Tachypnea --I believe this is secondary to her a fib with RVR.  I do not believe she is having a COPD exacerbation at this time.  No wheezing on exam.  Continue formulary equivalent for Symbicort for now.  Anxiety, which I believe is contributing to her shortness of breath.  This presentation seems very similar to April. --Improved after IV ativan in the ED. --Continue oral klonopin prn on the floor.  Syncope --Likely related to intermittent arrhythmia --Telemetry monitoring --Echo deferred; she had one in April  Mild hypokalemia --Replacement ordered in the ED.  MDS/MPN --Hold anagrelide while admitted.  Chronic diastolic CHF.  No overt pulmonary edema on chest xray but she has leg swelling. --Stop IV fluids now --Continue home dose of lasix for now --Strict I/O --Daily weights  DVT prophylaxis: Lovenox Code Status: DNR Family Communication: Patient alone in the ED at time of admission. Disposition Plan: To be determined. Consults called: Cardiology saw the patient in the ED. Admission status: Place in observation, stepdown unit.   TIME SPENT: 70 minutes   Eber [name] MD [facility] Pager [phone]  If 7PM-7AM, please contact night-coverage [email or web address] Password [facility]  03/02/2017, 8:44 PM

## 2017-03-02 NOTE — Consult Note (Addendum)
Cardiology Consult    Patient ID: Lori [name] MRN: [id number], DOB/AGE: 1926-07-22   Admit date: 03/02/2017 Date of Consult: 03/02/2017  Primary Physician: Leanna Battles, MD Primary Cardiologist: Dr. Aundra Dubin, Dr. Servando Snare Requesting Provider: Dr. Rogene Houston  Reason for Consult: SVT  Patient Profile    Lori [name] has a PMH significant for HTN, asthma, COPD, chronic diastolic dysfunction, and multifocal atrial tachycardia. She presented to [facility] from [place] with abnormal heart rhythm.   Lori [name] is a 83 y.o. female who is being seen today for the evaluation of SVT at the request of Dr. Lucien Mons.   Past Medical History   Past Medical History:  Diagnosis Date  . ALLERGIC RHINITIS   . Anxiety   . Asthma   . Atrial fibrillation ([place])   . Back pain of thoracolumbar region    right  . Bronchiectasis   . Colles' fracture of left radius   . COPD (chronic obstructive pulmonary disease) ([place])   . Coronary artery disease   . Diastolic dysfunction   . Diverticulosis   . Dysrhythmia   . History of pneumonia   . Hx of cardiovascular stress test    a. ETT-MV 1/14: Exercised 4:16, no ECG changes, poor ex tol, ant defect c/w soft tissue atten, no ischemia, EF 75%  . Hx of colonic polyp   . Hypertension   . IBS (irritable bowel syndrome)   . Insomnia   . Multiple rib fractures 10/2008   bilateral  . Pleural effusion, left   . Pruritus   . Pulmonary nodule   . Syncope     Past Surgical History:  Procedure Laterality Date  . ANKLE CLOSED REDUCTION Right 12/11/2014   Procedure: CLOSED REDUCTION ANKLE;  Surgeon: Rod Can, MD;  Location: [facility];  Service: Orthopedics;  Laterality: Right;  . APPENDECTOMY    . BLADDER SUSPENSION  2005   A-P  . CHOLECYSTECTOMY    . COLONOSCOPY  04/06/2012   Procedure: COLONOSCOPY;  Surgeon: Jerene Bears, MD;  Location: [facility];  Service: Gastroenterology;  Laterality: N/A;  . EXTERNAL FIXATION LEG Right 12/12/2014   Procedure:  ADJUSTMENT OF EXTERNAL FIXATION  RIGHT ANKLE;  Surgeon: Rod Can, MD;  Location: [place];  Service: Orthopedics;  Laterality: Right;  . EXTERNAL FIXATION LEG Right 12/11/2014   Procedure: POSSIBLE EXTERNAL FIXATION RIGHT ANKLE;  Surgeon: Rod Can, MD;  Location: [place];  Service: Orthopedics;  Laterality: Right;  . HARDWARE REMOVAL Right 12/25/2014   Procedure: RIGHT ANKLE REMOVE OF DEEP IMPLANT ANE EXTERNAL FIXATOR ;  Surgeon: [name], MD;  Location: [place];  Service: Orthopedics;  Laterality: Right;  . ORIF ANKLE FRACTURE Right 12/25/2014   Procedure: OPEN REDUCTION INTERNAL FIXATION (ORIF)TRIMALLEOLAR  ANKLE FRACTURE;  Surgeon: [name], MD;  Location: [place];  Service: Orthopedics;  Laterality: Right;  . ORIF WRIST FRACTURE Left 2010     Dr. Burney Gauze.  Marland Kitchen POLYPECTOMY    . ROTATOR CUFF REPAIR Right 2006  . TONSILLECTOMY    . TONSILLECTOMY    . TOTAL KNEE ARTHROPLASTY Right 08/2010     Allergies  Allergies  Allergen Reactions  . Albuterol Sulfate Palpitations  . Levaquin [Levofloxacin In D5w] Other (See Comments)    QT prolongation. Should avoid all flouroquinolones.   . Doxycycline Diarrhea  . Lactose Intolerance (Gi) Other (See Comments)  . Amoxicillin Other (See Comments)    REACTION: unspecified  . Codeine Other (See Comments)    Can  take Hydrocodone  . Penicillins Hives, Itching and Rash    Hard lump and rash Has patient had a PCN reaction causing immediate rash, facial/tongue/throat swelling, SOB or lightheadedness with hypotension: Yes Has patient had a PCN reaction causing severe rash involving mucus membranes or skin necrosis: No Has patient had a PCN reaction that required hospitalization unknown Has patient had a PCN reaction occurring within the last 10 years: No If all of the above answers are "NO", then may proceed with Cephalosporin use.  . Sulfa Antibiotics Nausea Only  . Sulfonamide Derivatives Nausea Only     History of Present Illness    Lori [name] is known to our service and last saw Robbie [facility] on 02/02/17. Previous EKGs with multifactorial atrial tachycardia. She has never been definitely diagnosed with atrial fibrillation and is not on anticoagulation. She was intolerant to amiodarone in the past (skin discoloration) and is now on cardizem.   She was recently seen in the ED on 02/16/17 for a syncopal event and found to have a UTI and poor PO intake. She was treated and released same day. She was also seen on 02/22/17 and treated for CAP and COPD exacerbation. She was released the same day.  She has been diagnosed with myelodysplastic / myeloproliferative neoplasms and is following with oncology. Oncology has discussed treatment vs hospice with her and her husband. It has been recommended to her that she and her husband need to move to a higher level of care.  Today, she states she woke up and felt very short of breath. She was struggling to breathe with chest pain she associates with her recent bilateral PNA and overall feeling unwell. [facility] took her to a PCP appt who sent her to [facility] for further evaluation. Upon arrival, she was found to be tachycardic in the 150s. She has increased work of breathing, but is satting in the low 90s on room air. She states she does not feel palpitations, dizziness, lightheadedness and denies feelings of syncope. She states her chest pain was bilateral and began when she was diagnosed with CAP last week. She was placed on a diltiazem drip that was titrated to 15 mg/hr during my exam. Her HR continues in the 100-140s.    She has recently had to move out of her house so that plumbing issues can be repaired. She does not want to move to a higher level of care. She is staying in a nearby guest residence on the [facility] property.   Inpatient Medications       Outpatient Medications    Prior to Admission medications   Medication Sig Start Date End Date  Taking? Authorizing Provider  acetaminophen (TYLENOL) 500 MG tablet Take 1,000 mg by mouth every 6 (six) hours as needed for headache (pain). Not to exceed 3000mg  daily    [provider]  anagrelide (AGRYLIN) 1 MG capsule Take 2 tab in the morning, and 1 tab in everning 01/30/17   Truitt Merle, MD  aspirin EC 81 MG tablet Take 1 tablet (81 mg total) by mouth daily. 06/02/15   Burtis Junes, NP  bimatoprost (LUMIGAN) 0.01 % SOLN Place 1 drop into both eyes at bedtime. 01/10/17   Reed, Tiffany L, DO  budesonide-formoterol (SYMBICORT) 160-4.5 MCG/ACT inhaler Inhale 2 puffs into the lungs 2 (two) times daily. 01/10/17   Reed, Tiffany L, DO  cephALEXin (KEFLEX) 500 MG capsule Take 1 capsule (500 mg total) by mouth 4 (four) times daily. 02/22/17  Isla Pence, MD  clindamycin (CLEOCIN) 300 MG capsule Take 600 mg by mouth See admin instructions. Take 2 capsules (600 mg) by mouth one hour prior to dental procedures    [provider]  clonazePAM (KLONOPIN) 0.5 MG tablet Take 0.5 tablets (0.25 mg total) by mouth 3 (three) times daily as needed (anxiety). 12/27/16   Geradine Girt, DO  dicyclomine (BENTYL) 10 MG capsule Take 1 capsule (10 mg total) by mouth 3 (three) times daily before meals. IBS 01/10/17   Reed, Tiffany L, DO  diltiazem (CARDIZEM CD) 300 MG 24 hr capsule Take 1 capsule (300 mg total) by mouth daily. 02/02/17   Bhagat, Crista Luria, PA  furosemide (LASIX) 20 MG tablet Take 1 tablet (20 mg total) by mouth daily. Take (40 mg ) for 3 days starting 4/24 - 426 than (20 mg ) daily 01/10/17 04/10/17  Burtis Junes, NP  loperamide (IMODIUM) 2 MG capsule Take 2 mg by mouth as needed for diarrhea or loose stools.     [provider]  Multiple Vitamin (MULTIVITAMIN WITH MINERALS) TABS tablet Take 1 tablet by mouth daily.    [provider]  nebivolol (BYSTOLIC) 2.5 MG tablet Take 1 tablet (2.5 mg total) by mouth daily. 01/09/17   Reed, Tiffany L, DO  polyvinyl alcohol  (LIQUIFILM TEARS) 1.4 % ophthalmic solution Place 1 drop into both eyes 3 (three) times daily.    [provider]  predniSONE (STERAPRED UNI-PAK 21 TAB) 10 MG (21) TBPK tablet Take 6 tabs by mouth daily  for 2 days, then 5 tabs for 2 days, then 4 tabs for 2 days, then 3 tabs for 2 days, 2 tabs for 2 days, then 1 tab by mouth daily for 2 days 02/22/17   Isla Pence, MD  zolpidem (AMBIEN) 5 MG tablet Take 1 tablet (5 mg total) by mouth at bedtime as needed for sleep. 06/10/15   Larey Dresser, MD     Family History    Family History  Problem Relation Age of Onset  . Heart disease Mother   . Rheumatic fever Mother   . Bipolar disorder Daughter   . Pneumonia Father   . Colon cancer Neg Hx     Social History    Social History   Social History  . Marital status: Married    Spouse name: N/A  . Number of children: 3  . Years of education: N/A   Occupational History  . retired Retired   Social History Main Topics  . Smoking status: Former Smoker    Packs/day: 0.30    Years: 10.00    Types: Cigarettes    Quit date: 09/19/1948  . Smokeless tobacco: Never Used  . Alcohol use No     Comment: rarely  . Drug use: No  . Sexual activity: No   Other Topics Concern  . Not on file   Social History Narrative   [place]; [place]. Married 1959 - spouse s/p valve surgery with resulting paralysis hemidiaphragm ('10). 3 daughter, 5 grandsons, 4 granddaughters.  1 daughter bipolar - social issues. She remains very active, but can't play tennis. Pt was apprently ex-smoker, but husband does not recollect pt ever smoking. Stressful move to smaller quarters (fall '09) and now moving to [facility] (fall '12).      ACP - she clearly states No CPR, no mechanical ventilation and that quality of life is of key importance. Provided MOST sample, directed to  [email or web address] and provided an Out of Facility Order. (Jan '14)     Review of Systems    General:   No chills, fever, night sweats or weight changes.  Cardiovascular:  + chest pain, + dyspnea on exertion, edema, orthopnea, palpitations, paroxysmal nocturnal dyspnea. Dermatological: No rash, lesions/masses Respiratory: No cough, + dyspnea Urologic: No hematuria, dysuria Abdominal:   No nausea, vomiting, diarrhea, bright red blood per rectum, melena, or hematemesis Neurologic:  No visual changes, wkns, changes in mental status. All other systems reviewed and are otherwise negative except as noted above.  Physical Exam    Blood pressure 131/87, pulse (!) 121, temperature 97.7 F (36.5 C), temperature source Oral, resp. rate (!) 24, height 5\' 5"  (1.651 m), weight 170 lb (77.1 kg), SpO2 95 %.  General: Pleasant, NAD Psych: Normal affect. Neuro: Alert and oriented X 3. Moves all extremities spontaneously. HEENT: Normal  Neck: Supple without bruits or JVD. Lungs:  Poor air movement, increased work of breathing, coarse sounds throughout Heart: Irregular rhythm and rate, murmurs difficult to assess given her tachycardic rate Abdomen: Soft, non-tender, non-distended, BS + x 4.  Extremities: No clubbing, cyanosis, 1+ edema. DP/PT/Radials 1+ and equal bilaterally.  Labs    Troponin (Point of Care Test) No results for input(s): TROPIPOC in the last 72 hours. No results for input(s): CKTOTAL, CKMB, TROPONINI in the last 72 hours. Lab Results  Component Value Date   WBC 5.9 03/02/2017   HGB 9.8 (L) 03/02/2017   HCT 31.5 (L) 03/02/2017   MCV 96.6 03/02/2017   PLT 292 03/02/2017     Recent Labs Lab 03/02/17 1504  NA 138  K 3.3*  CL 108  CO2 23  BUN 24*  CREATININE 0.81  CALCIUM 8.4*  PROT 6.0*  BILITOT 0.7  ALKPHOS 65  ALT 23  AST 23  GLUCOSE 126*   Lab Results  Component Value Date   CHOL 247 (H) 06/05/2014   HDL 75.60 06/05/2014   LDLCALC 156 (H) 06/05/2014   TRIG 78.0 06/05/2014   Lab Results  Component Value Date   DDIMER 0.65 (H) 02/04/2014     Radiology  Studies    Dg Chest 2 View  Result Date: 02/22/2017 CLINICAL DATA:  Dyspnea since 6 p.m. with chest pain. EXAM: CHEST  2 VIEW COMPARISON:  01/04/2017 CT and CXR FINDINGS: The patient is slightly tilted to the right and rotated on current exam. Heart is borderline enlarged. There is mild aortic atherosclerosis without aneurysm. Hazy opacities at the lung bases are in keeping with new layering small to moderate pleural effusions bilaterally. Hazy appearance of the lungs may also be secondary mild pulmonary edema. High-riding right humeral head. No acute osseous abnormality. IMPRESSION: Findings suggest mild pulmonary edema with new layering small to moderate posterior pleural effusions. Aortic atherosclerosis. Electronically Signed   By: Ashley Royalty M.D.   On: 02/22/2017 20:23   Dg Chest Port 1 View  Result Date: 03/02/2017 CLINICAL DATA:  Shortness of breath tachycardia and productive cough. EXAM: PORTABLE CHEST 1 VIEW COMPARISON:  February 22, 2017. FINDINGS: The heart size and mediastinal contours are stable. The heart size is mildly enlarged. There is minimal increased pulmonary interstitium. There is no focal pneumonia or pleural effusion. The visualized skeletal structures are stable. IMPRESSION: Mild cardiomegaly. Minimal increased pulmonary interstitium unchanged. Electronically Signed   By: Abelardo Diesel M.D.   On: 03/02/2017 15:23    ECG & Cardiac Imaging    EKG 03/02/17: SVT vs atrial tachycardia, LVH  Echocardiogram 12/26/16: Study Conclusions - Left ventricle: The cavity size was normal. There was mild   concentric hypertrophy. Systolic function was normal. The   estimated ejection fraction was in the range of 60% to 65%. Wall   motion was normal; there were no regional wall motion   abnormalities. The study was not technically sufficient to allow   evaluation of LV diastolic dysfunction due to atrial   fibrillation. - Aortic valve: Trileaflet; normal thickness leaflets. There was no    regurgitation. - Aortic root: The aortic root was normal in size. - Right ventricle: The cavity size was normal. Wall thickness was   normal. Systolic function was normal. - Right atrium: The atrium was normal in size. - Tricuspid valve: There was mild regurgitation. - Pulmonary arteries: Systolic pressure was moderately increased.   PA peak pressure: 49 mm Hg (S). - Systemic veins: Not visualized. - Pericardium, extracardiac: There was no pericardial effusion.  Assessment & Plan    1. Multifactorial atrial tachycardia vs atrial fibrillation - EKG difficult to interpret given her rate in the 140s - on diltiazem drip at 15 mg/hr - home medications include ASA, 300 mg cardizem CD, and bystolic - recommend holding bystolic while titrating cardizem dose - transition IV cardizem to 360 mg cardizem CD - this may be related to dehydration given her poor PO intake and/or anemia and/or COPD exacerbation - she is on bystolic at home and can tolerate a beta blocker int he setting of COPD - may add lopressor 25 mg BID if her rate is uncontrolled on cardizem Recommend admitting to medicine service to treat these possible underlying causes. Would not recommend cycling troponins as she is not a stress test or cath candidate.   2. Elevated PA peak pressure - pt is not extremely volume overloaded on exam today and does not take a lasix regimen at home; noted LE edema on exam which is her baseline (per patient) - BNP is elevated at 458.3   3. Hypokalemia - K 3.3 - replace via kdur, per primary team - Magnesium pending   Signed, Ledora Bottcher, PA-C 03/02/2017, 4:40 PM [phone]  Attending Note:   The patient was seen and examined.  Agree with assessment and plan as noted above.  Changes made to the above note as needed.  Patient seen and independently examined with Doreene Adas, PA .   We discussed all aspects of the encounter. I agree with the assessment and plan as stated  above.  1.   SVT:   Difficult to tell this is MAT vs. Atrial fib. She has had many episodes of MAT in the past. This episode appears to be secondary to a COPD exacerbation. She has very poor air movement and has wheezes and rales.  Continue Dilt drip .  Consider increasing her beta blocker  She has failed amiodarone in the past ( skin discoloration )  I suspect that her tachycardia will improve once we better control her COPD   ONce her HR has slowed some, we will be able to better tell if this is another episode of MAT vs. Atrial fib . She is not a candidate for anticoagulation per previous notes ( h  2. Dyspnea /  COPD exacerbation:  With secondary pulmonary HTN Further plans per IM  I think this is the cause of her tachycardia     I have spent a total of 40 minutes with patient reviewing hospital  notes , telemetry, EKGs, labs and examining patient  as well as establishing an assessment and plan that was discussed with the patient. > 50% of time was spent in direct patient care.   Thayer Headings, Brooke Bonito., MD, [facility] 03/02/2017, 5:27 PM [id number]  [place] Pager [phone]

## 2017-03-03 ENCOUNTER — Other Ambulatory Visit: Payer: Self-pay | Admitting: *Deleted

## 2017-03-03 DIAGNOSIS — D473 Essential (hemorrhagic) thrombocythemia: Secondary | ICD-10-CM

## 2017-03-03 DIAGNOSIS — F419 Anxiety disorder, unspecified: Secondary | ICD-10-CM

## 2017-03-03 DIAGNOSIS — I4891 Unspecified atrial fibrillation: Secondary | ICD-10-CM | POA: Diagnosis not present

## 2017-03-03 DIAGNOSIS — E876 Hypokalemia: Secondary | ICD-10-CM

## 2017-03-03 DIAGNOSIS — I10 Essential (primary) hypertension: Secondary | ICD-10-CM | POA: Diagnosis not present

## 2017-03-03 DIAGNOSIS — J449 Chronic obstructive pulmonary disease, unspecified: Secondary | ICD-10-CM

## 2017-03-03 LAB — BASIC METABOLIC PANEL
Anion gap: 8 (ref 5–15)
BUN: 21 mg/dL — AB (ref 6–20)
CHLORIDE: 106 mmol/L (ref 101–111)
CO2: 22 mmol/L (ref 22–32)
CREATININE: 0.76 mg/dL (ref 0.44–1.00)
Calcium: 8.1 mg/dL — ABNORMAL LOW (ref 8.9–10.3)
GFR calc Af Amer: >60 mL/min (ref >60)
GFR calc non Af Amer: >60 mL/min (ref >60)
Glucose, Bld: 117 mg/dL — ABNORMAL HIGH (ref 65–99)
POTASSIUM: 3.8 mmol/L (ref 3.5–5.1)
SODIUM: 136 mmol/L (ref 135–145)

## 2017-03-03 LAB — MRSA PCR SCREENING: MRSA BY PCR: NEGATIVE

## 2017-03-03 LAB — CBC
HEMATOCRIT: 28.3 % — AB (ref 36.0–46.0)
HEMOGLOBIN: 8.6 g/dL — AB (ref 12.0–15.0)
MCH: 29.9 pg (ref 26.0–34.0)
MCHC: 30.4 g/dL (ref 30.0–36.0)
MCV: 98.3 fL (ref 78.0–100.0)
Platelets: 244 10*3/uL (ref 150–400)
RBC: 2.88 MIL/uL — ABNORMAL LOW (ref 3.87–5.11)
RDW: 17.1 % — ABNORMAL HIGH (ref 11.5–15.5)
WBC: 5.9 10*3/uL (ref 4.0–10.5)

## 2017-03-03 MED ORDER — LOSARTAN POTASSIUM 25 MG PO TABS
25.0000 mg | ORAL_TABLET | Freq: Every day | ORAL | Status: DC
  Administered 2017-03-03 – 2017-03-06 (×4): 25 mg via ORAL
  Filled 2017-03-03 (×4): qty 1

## 2017-03-03 MED ORDER — DILTIAZEM HCL ER COATED BEADS 180 MG PO CP24
360.0000 mg | ORAL_CAPSULE | Freq: Every day | ORAL | Status: DC
  Administered 2017-03-03 – 2017-03-07 (×5): 360 mg via ORAL
  Filled 2017-03-03 (×5): qty 2

## 2017-03-03 MED ORDER — HYDRALAZINE HCL 20 MG/ML IJ SOLN: 5.0000 mg | Freq: Four times a day (QID) | INTRAMUSCULAR | Status: DC | PRN

## 2017-03-03 MED ORDER — ANAGRELIDE HCL 1 MG PO CAPS: ORAL_CAPSULE | ORAL | 0 refills | Status: DC

## 2017-03-03 MED ORDER — METOPROLOL TARTRATE 25 MG PO TABS: 25.0000 mg | ORAL_TABLET | Freq: Two times a day (BID) | ORAL | Status: DC | PRN

## 2017-03-03 MED ORDER — LEVALBUTEROL HCL 1.25 MG/0.5ML IN NEBU
1.2500 mg | INHALATION_SOLUTION | Freq: Four times a day (QID) | RESPIRATORY_TRACT | Status: DC | PRN
  Administered 2017-03-03: 1.25 mg via RESPIRATORY_TRACT
  Filled 2017-03-03: qty 0.5

## 2017-03-03 NOTE — Progress Notes (Signed)
Progress Note  Patient Name: Lori [name] Date of Encounter: 03/03/2017  Primary Cardiologist: Harlen Labs  Subjective   Jancy is still short of breath  HR is slower   Husband is frustrated that she was let go from [facility] last week before she was feeling better.   Inpatient Medications    Scheduled Meds: . aspirin EC  81 mg Oral Daily  . diltiazem  360 mg Oral Daily  . enoxaparin (LOVENOX) injection  40 mg Subcutaneous Q24H  . furosemide  20 mg Oral Daily  . latanoprost  1 drop Both Eyes QHS  . mometasone-formoterol  2 puff Inhalation BID  . polyvinyl alcohol  1 drop Both Eyes TID   Continuous Infusions:  PRN Meds: acetaminophen, clonazePAM, levalbuterol, metoprolol tartrate, ondansetron (ZOFRAN) IV, zolpidem   Vital Signs    Vitals:   03/03/17 0343 03/03/17 0723 03/03/17 0731 03/03/17 0850  BP: (!) 168/77 (!) 162/73    Pulse: 71 80    Resp:      Temp: 97.6 F (36.4 C) 98 F (36.7 C)    TempSrc: Oral Oral    SpO2: 94%  97% 99%  Weight: 138 lb 12.8 oz (63 kg)     Height:        Intake/Output Summary (Last 24 hours) at 03/03/17 1039 Last data filed at 03/03/17 0900  Gross per 24 hour  Intake              120 ml  Output              150 ml  Net              -30 ml   Filed Weights   03/02/17 1426 03/02/17 2047 03/03/17 0343  Weight: 170 lb (77.1 kg) 137 lb 12.8 oz (62.5 kg) 138 lb 12.8 oz (63 kg)    Telemetry    NSR  - Personally Reviewed  ECG     MAT  - Personally Reviewed  Physical Exam   GEN: No acute distress.  Elderly female,  Hard of hearing  Neck: No JVD Cardiac: RR, no murmurs, rubs, or gallops.  Respiratory:  few wheezes  GI: Soft, nontender, non-distended  MS: No edema; No deformity. Neuro:  Nonfocal  Psych: Normal affect   Labs    Chemistry Recent Labs Lab 03/02/17 1504 03/03/17 0425  NA 138 136  K 3.3* 3.8  CL 108 106  CO2 23 22  GLUCOSE 126* 117*  BUN 24* 21*  CREATININE 0.81 0.76  CALCIUM  8.4* 8.1*  PROT 6.0*  --   ALBUMIN 3.7  --   AST 23  --   ALT 23  --   ALKPHOS 65  --   BILITOT 0.7  --   GFRNONAA >60 >60  GFRAA >60 >60  ANIONGAP 7 8     Hematology Recent Labs Lab 02/27/17 1237 03/02/17 1504 03/03/17 0425  WBC 4.7 5.9 5.9  RBC 3.18* 3.26* 2.88*  HGB 9.7* 9.8* 8.6*  HCT 30.5* 31.5* 28.3*  MCV 95.9 96.6 98.3  MCH 30.5 30.1 29.9  MCHC 31.8 31.1 30.4  RDW 15.7* 16.6* 17.1*  PLT 348 292 244    Cardiac EnzymesNo results for input(s): TROPONINI in the last 168 hours. No results for input(s): TROPIPOC in the last 168 hours.   BNP Recent Labs Lab 03/02/17 1504  BNP 458.3*     DDimer No results for input(s): DDIMER in the last 168 hours.  Radiology    Dg Chest Port 1 View  Result Date: 03/02/2017 CLINICAL DATA:  Shortness of breath tachycardia and productive cough. EXAM: PORTABLE CHEST 1 VIEW COMPARISON:  February 22, 2017. FINDINGS: The heart size and mediastinal contours are stable. The heart size is mildly enlarged. There is minimal increased pulmonary interstitium. There is no focal pneumonia or pleural effusion. The visualized skeletal structures are stable. IMPRESSION: Mild cardiomegaly. Minimal increased pulmonary interstitium unchanged. Electronically Signed   By: Abelardo Diesel M.D.   On: 03/02/2017 15:23    Cardiac Studies      Patient Profile     83 y.o. female with chronic lung disease, Admitted with MAT   Assessment & Plan    1,  MAT  HR is better . Is back in NSR  Continue current meds.  2. Chronic lung disease:   She is still very short of breath. She and her husband were very concerned about this and had lots of questions. She does not want to leave until she is breathing better.   Cardiac status is stable Will sign off Call for questions She will follow up with Truitt Merle, NP in our office.   Signed, Mertie Moores, MD  03/03/2017, 10:39 AM

## 2017-03-03 NOTE — Care Management Obs Status (Signed)
[facility] NOTIFICATION   Patient Details  Name: Lori [name] MRN: [id number] Date of Birth: [birth year]   Medicare Observation Status Notification Given:  Yes    Zenon Mayo, RN 03/03/2017, 5:59 PM

## 2017-03-03 NOTE — Plan of Care (Signed)
Problem: Safety: Goal: Ability to remain free from injury will improve Outcome: Progressing Attempted to exit the bed a couple of times. Encouraged to call staff for assistance.  Call bed within reach.

## 2017-03-03 NOTE — Progress Notes (Signed)
Patient ID: Lori [name], female   DOB: 1926-07-07, 83 y.o.   MRN: [id number]                                                                PROGRESS NOTE                                                                                                                                                                                                             Patient Demographics:    Lori [name], is a 83 y.o. female, DOB - 1926/05/28, QMV:[id number]  Admit date - 03/02/2017   Admitting Physician Lily Kocher, MD  Outpatient Primary MD for the patient is Leanna Battles, MD  LOS - 0  Outpatient Specialists:    Chief Complaint  Patient presents with  . Atrial Fibrillation       Brief Narrative   83 y.o. woman with a history of HTN, diastolic CHF, CAD, COPD, remote tobacco use, recent diagnosis of MDS/MPN (she is on Anagrelide; followed by Dr. Burr Medico) who presents to the ED from [facility] for evaluation of palpitations and shortness of breath, onset earlier today.  She denies associated chest pain.  No nausea.  She has increased edema in her legs.  She reports that she has just finished a course of antibiotic for "double pneumonia", so she thought her breathing would be better, not worse. She also reports three syncopal episodes in the past two weeks.  ED Course: The patient was found to be in atrial fibrillation with RVR.  Cardiology was asked to see the patient in the ED.  Recommendations noted.  Hospitalist asked to admit.  At the time of my evaluation, she remained tachycardic with heart rates near 150 despite being on max dose cardizem infusion.    Subjective:    [facility] today has slight dyspnea, but overall improved.  HR improved. Afebrile,   No headache, No chest pain, No abdominal pain - No Nausea, No new weakness tingling or numbness, No Cough -   Assessment  & Plan :    Active Problems:   Essential hypertension   Chronic obstructive pulmonary disease (HCC)   MDS/MPN  (myelodysplastic/myeloproliferative neoplasms) (HCC)   Syncope   Tachypnea   Anxiety   Hypokalemia    Atrial fibrillation with RVR, recurrent (CHADS-Vasc  score of at least 5; she was not deemed to be a candidate for long-term anticoagulation in the past). -- d/c IV cardizem infusion --Start Cardizem CD 360mg  po qday -  Start lopressor 25mg  po BID prn  (she never received IV lopressor as ordered in the ED); hold Bystolic --Check TFTs --It has been previously documented that she is not a candidate for anticoagulation.   Tachypnea --I believe this is secondary to her a fib with RVR.  I do not believe she is having a COPD exacerbation at this time.  No wheezing on exam.  Continue formulary equivalent for Symbicort for now.  Anxiety, which I believe is contributing to her shortness of breath.  This presentation seems very similar to April. --Improved after IV ativan in the ED. --Continue oral klonopin prn on the floor.  Syncope --Likely related to intermittent arrhythmia --Telemetry monitoring --Echo deferred; she had one in April  Mild hypokalemia --Replacement ordered in the ED.  MDS/MPN --Hold anagrelide while admitted.  Chronic diastolic CHF.  No overt pulmonary edema on chest xray but she has leg swelling. --Continue home dose of lasix for now --Strict I/O --Daily weights  DVT prophylaxis: Lovenox Code Status: DNR Family Communication:  w patient Disposition Plan: To be determined. Consults called: Cardiology saw the patient in the ED. Admission status: Place in observation, stepdown unit.    Lab Results  Component Value Date   PLT 244 03/03/2017      Anti-infectives    None        Objective:   Vitals:   03/02/17 2047 03/02/17 2212 03/02/17 2300 03/03/17 0343  BP: 132/65 (!) 146/79 (!) 168/79 (!) 168/77  Pulse: 99 99 74 71  Resp: (!) 25  (!) 26   Temp: 97.5 F (36.4 C)  97.6 F (36.4 C) 97.6 F (36.4 C)  TempSrc: Axillary  Oral Oral    SpO2: 98%  93% 94%  Weight: 62.5 kg (137 lb 12.8 oz)   63 kg (138 lb 12.8 oz)  Height:        Wt Readings from Last 3 Encounters:  03/03/17 63 kg (138 lb 12.8 oz)  02/22/17 59 kg (130 lb)  02/16/17 59 kg (130 lb)    No intake or output data in the 24 hours ending 03/03/17 0719   Physical Exam  Awake Alert, Oriented X 3, No new F.N deficits, Normal affect [place].AT,PERRAL Supple Neck,No JVD, No cervical lymphadenopathy appriciated.  Symmetrical Chest wall movement, Good air movement bilaterally, CTAB RRR,No Gallops,Rubs or new Murmurs, No Parasternal Heave +ve B.Sounds, Abd Soft, No tenderness, No organomegaly appriciated, No rebound - guarding or rigidity. No Cyanosis, Clubbing or edema, No new Rash or bruise      Data Review:    CBC  Recent Labs Lab 02/27/17 1237 03/02/17 1504 03/03/17 0425  WBC 4.7 5.9 5.9  HGB 9.7* 9.8* 8.6*  HCT 30.5* 31.5* 28.3*  PLT 348 292 244  MCV 95.9 96.6 98.3  MCH 30.5 30.1 29.9  MCHC 31.8 31.1 30.4  RDW 15.7* 16.6* 17.1*  LYMPHSABS 1.7 1.8  --   MONOABS 0.1 0.2  --   EOSABS 0.0 0.0  --   BASOSABS 0.0 0.1  --     Chemistries   Recent Labs Lab 03/02/17 1504 03/03/17 0425  NA 138 136  K 3.3* 3.8  CL 108 106  CO2 23 22  GLUCOSE 126* 117*  BUN 24* 21*  CREATININE 0.81 0.76  CALCIUM 8.4* 8.1*  MG 2.0  --  AST 23  --   ALT 23  --   ALKPHOS 65  --   BILITOT 0.7  --    ------------------------------------------------------------------------------------------------------------------ No results for input(s): CHOL, HDL, LDLCALC, TRIG, CHOLHDL, LDLDIRECT in the last 72 hours.  Lab Results  Component Value Date   HGBA1C (H) 10/04/2010    5.7 (NOTE)                                                                       According to the ADA Clinical Practice Recommendations for 2011, when HbA1c is used as a screening test:   >=6.5%   Diagnostic of Diabetes Mellitus           (if abnormal result  is confirmed)  5.7-6.4%    Increased risk of developing Diabetes Mellitus  References:Diagnosis and Classification of Diabetes Mellitus,Diabetes IPJA,2505,39(JQBHA 1):S62-S69 and Standards of Medical Care in         Diabetes - 2011,Diabetes LPFX,9024,09  (Suppl 1):S11-S61.   ------------------------------------------------------------------------------------------------------------------  Recent Labs  03/02/17 2108  TSH 2.712   ------------------------------------------------------------------------------------------------------------------ No results for input(s): VITAMINB12, FOLATE, FERRITIN, TIBC, IRON, RETICCTPCT in the last 72 hours.  Coagulation profile No results for input(s): INR, PROTIME in the last 168 hours.  No results for input(s): DDIMER in the last 72 hours.  Cardiac Enzymes No results for input(s): CKMB, TROPONINI, MYOGLOBIN in the last 168 hours.  Invalid input(s): CK ------------------------------------------------------------------------------------------------------------------    Component Value Date/Time   BNP 458.3 (H) 03/02/2017 1504   BNP 77.9 04/11/2016 1537    Inpatient Medications  Scheduled Meds: . aspirin EC  81 mg Oral Daily  . enoxaparin (LOVENOX) injection  40 mg Subcutaneous Q24H  . furosemide  20 mg Oral Daily  . latanoprost  1 drop Both Eyes QHS  . metoprolol tartrate  25 mg Oral BID  . mometasone-formoterol  2 puff Inhalation BID  . polyvinyl alcohol  1 drop Both Eyes TID   Continuous Infusions: . diltiazem (CARDIZEM) infusion 10 mg/hr (03/03/17 0303)   PRN Meds:.acetaminophen, clonazePAM, levalbuterol, ondansetron (ZOFRAN) IV, zolpidem  Micro Results Recent Results (from the past 240 hour(s))  TECHNOLOGIST REVIEW     Status: None   Collection Time: 02/27/17 12:37 PM  Result Value Ref Range Status   Technologist Review   Final    Few Metas and Myelocytes present, 2% nRBCs, dysplastic neutrophils, large and giant platelets   MRSA PCR Screening     Status:  None   Collection Time: 03/02/17  8:54 PM  Result Value Ref Range Status   MRSA by PCR NEGATIVE NEGATIVE Final    Comment:        The GeneXpert MRSA Assay (FDA approved for NASAL specimens only), is one component of a comprehensive MRSA colonization surveillance program. It is not intended to diagnose MRSA infection nor to guide or monitor treatment for MRSA infections.     Radiology Reports Dg Chest 2 View  Result Date: 02/22/2017 CLINICAL DATA:  Dyspnea since 6 p.m. with chest pain. EXAM: CHEST  2 VIEW COMPARISON:  01/04/2017 CT and CXR FINDINGS: The patient is slightly tilted to the right and rotated on current exam. Heart is borderline enlarged. There is mild aortic atherosclerosis without aneurysm. Hazy opacities at the lung bases  are in keeping with new layering small to moderate pleural effusions bilaterally. Hazy appearance of the lungs may also be secondary mild pulmonary edema. High-riding right humeral head. No acute osseous abnormality. IMPRESSION: Findings suggest mild pulmonary edema with new layering small to moderate posterior pleural effusions. Aortic atherosclerosis. Electronically Signed   By: Ashley Royalty M.D.   On: 02/22/2017 20:23   Dg Chest Port 1 View  Result Date: 03/02/2017 CLINICAL DATA:  Shortness of breath tachycardia and productive cough. EXAM: PORTABLE CHEST 1 VIEW COMPARISON:  February 22, 2017. FINDINGS: The heart size and mediastinal contours are stable. The heart size is mildly enlarged. There is minimal increased pulmonary interstitium. There is no focal pneumonia or pleural effusion. The visualized skeletal structures are stable. IMPRESSION: Mild cardiomegaly. Minimal increased pulmonary interstitium unchanged. Electronically Signed   By: Abelardo Diesel M.D.   On: 03/02/2017 15:23    Time Spent in minutes  30   Jani Gravel M.D on 03/03/2017 at 7:19 AM  Between 7am to 7pm - Pager - [phone]  After 7pm go to [email or web address] - password [facility]  [facility] -  Office  [phone]

## 2017-03-03 NOTE — Progress Notes (Signed)
Increased work of breathing noted; accessory muscle use.  Respirations 32, 02 98% on 2L nasal cannula.  Wheezing bilaterally in lungs noted.  No PRN breathing treatments ordered. [facility] paged with this information.

## 2017-03-03 NOTE — Clinical Social Work Note (Signed)
Clinical Social Work Assessment  Patient Details  Name: Lori [name] MRN: [id number] Date of Birth: [birth year]  Date of referral:  03/03/17               Reason for consult:  Facility Placement                Permission sought to share information with:  Family Supports, Customer service manager Permission granted to share information::  Yes, Verbal Permission Granted  Name::     Best boy  Agency::  [facility]  Relationship::  Spouse  Contact Information:  home [phone] cell [phone]   Housing/Transportation Living arrangements for the past 2 months:  [place] of Information:  Patient, Spouse Patient Interpreter Needed:  None Criminal Activity/Legal Involvement Pertinent to Current Situation/Hospitalization:  No - Comment as needed Significant Relationships:  Spouse, Adult Children Lives with:  Spouse Do you feel safe going back to the place where you live?  Yes Need for family participation in patient care:  Yes (Comment)  Care giving concerns: Patient from [place] with spouse. Spouse at bedside and spouse expressed concern about patient treatment, as patient has had multiple ED visits over past few months.   Social Worker assessment / plan: CSW met with patient and spouse at bedside. Patient not yet assessed by PT/OT; CSW consulted as patient from facility. CSW will continue to follow and support with discharge back to facility as appropriate. Patient's spouse agreeable to discharge to SNF at [facility] if appropriate.  Employment status:  Retired Forensic scientist:  Programmer, applications ([employer]) PT Recommendations:  Not assessed at this time Information / Referral to community resources:  [place]  Patient/Family's Response to care: Patient oriented to self and preoccupied with missing hearing aid, which nurse arrived and assisted with during assessment. Spouse appreciative of patient's care, though  with questions about patient's condition.  Patient/Family's Understanding of and Emotional Response to Diagnosis, Current Treatment, and Prognosis:  Patient anxious and somewhat disoriented. Spouse expressed concern about patient's condition and is worried about prognosis.   Emotional Assessment Appearance:  Appears stated age Attitude/Demeanor/Rapport:  Other (anxious) Affect (typically observed):  Anxious Orientation:  Oriented to Self Alcohol / Substance use:  Not Applicable Psych involvement (Current and /or in the community):  No (Comment)  Discharge Needs  Concerns to be addressed:  Discharge Planning Concerns Readmission within the last 30 days:  No Current discharge risk:  Physical Impairment Barriers to Discharge:  Continued Medical Work up   Estanislado Emms, LCSW 03/03/2017, 2:45 PM

## 2017-03-03 NOTE — Progress Notes (Signed)
PRN Xopenex given for wheezing was effective. She is currently in bed with eyes closed.

## 2017-03-04 DIAGNOSIS — I10 Essential (primary) hypertension: Secondary | ICD-10-CM

## 2017-03-04 LAB — COMPREHENSIVE METABOLIC PANEL
ALT: 18 U/L (ref 14–54)
AST: 19 U/L (ref 15–41)
Albumin: 3.2 g/dL — ABNORMAL LOW (ref 3.5–5.0)
Alkaline Phosphatase: 58 U/L (ref 38–126)
Anion gap: 9 (ref 5–15)
BILIRUBIN TOTAL: 1.1 mg/dL (ref 0.3–1.2)
BUN: 13 mg/dL (ref 6–20)
CO2: 24 mmol/L (ref 22–32)
CREATININE: 0.68 mg/dL (ref 0.44–1.00)
Calcium: 8.4 mg/dL — ABNORMAL LOW (ref 8.9–10.3)
Chloride: 105 mmol/L (ref 101–111)
GFR calc Af Amer: >60 mL/min (ref >60)
GLUCOSE: 92 mg/dL (ref 65–99)
Potassium: 3.6 mmol/L (ref 3.5–5.1)
Sodium: 138 mmol/L (ref 135–145)
TOTAL PROTEIN: 5.5 g/dL — AB (ref 6.5–8.1)

## 2017-03-04 LAB — CBC
HEMATOCRIT: 31.5 % — AB (ref 36.0–46.0)
HEMOGLOBIN: 9.7 g/dL — AB (ref 12.0–15.0)
MCH: 29.8 pg (ref 26.0–34.0)
MCHC: 30.8 g/dL (ref 30.0–36.0)
MCV: 96.9 fL (ref 78.0–100.0)
Platelets: 230 10*3/uL (ref 150–400)
RBC: 3.25 MIL/uL — AB (ref 3.87–5.11)
RDW: 17.2 % — ABNORMAL HIGH (ref 11.5–15.5)
WBC: 5.4 10*3/uL (ref 4.0–10.5)

## 2017-03-04 MED ORDER — METOPROLOL TARTRATE 5 MG/5ML IV SOLN
5.0000 mg | Freq: Once | INTRAVENOUS | Status: DC
  Filled 2017-03-04: qty 5

## 2017-03-04 MED ORDER — METOPROLOL TARTRATE 25 MG PO TABS
25.0000 mg | ORAL_TABLET | Freq: Two times a day (BID) | ORAL | Status: DC
  Administered 2017-03-04 – 2017-03-05 (×2): 25 mg via ORAL
  Filled 2017-03-04 (×2): qty 1

## 2017-03-04 NOTE — Evaluation (Signed)
Physical Therapy Evaluation Patient Details Name: Lori [name] MRN: [id number] DOB: 12-07-1925 Today's Date: 03/04/2017   History of Present Illness  Patient is a 83 y/o female who presents due to SOB and palpitations. Found to be in A-fib with RVR. PMH includes COPD, diastolic CHF, recent diagnosis of MDS/MPN, A-fib, asthma, back pain, HTN, anxiety.   Clinical Impression  Patient presents with A-fib with RVR, generalized weakness, deconditioning and impaired mobility s/p above. Tolerated taking a few steps to get to chair with close Min guard assist for safety. HR ranging from 130s-170s sustaining in upper range. RN aware and gave meds 1.5 hours prior to session. Pt reports being ready for end of life. Pt from [place] and caring for self PTA. Pt's spouse not able to assist her at home. Pt high fall risk. Will follow acutely to maximize independence and mobility prior to return home.     Follow Up Recommendations SNF;Supervision for mobility/OOB;Supervision/Assistance - 24 hour    Equipment Recommendations  None recommended by PT    Recommendations for Other Services OT consult     Precautions / Restrictions Precautions Precautions: Fall Precaution Comments: watch HR Restrictions Weight Bearing Restrictions: No      Mobility  Bed Mobility Overal bed mobility: Needs Assistance Bed Mobility: Rolling;Sidelying to Sit Rolling: Min guard Sidelying to sit: Min guard;HOB elevated       General bed mobility comments: No assist needed, use of rail for support. No dizziness.  Transfers Overall transfer level: Needs assistance Equipment used: Rolling walker (2 wheeled) Transfers: Sit to/from Stand Sit to Stand: Min guard         General transfer comment: Min guard for safety. Stood from [employer]. Took a few steps to get to chair with min guard assist. HR ranging from 130s-170s.  Ambulation/Gait Ambulation/Gait assistance: Min guard Ambulation Distance (Feet): 3  Feet Assistive device: Rolling walker (2 wheeled) Gait Pattern/deviations: Trunk flexed Gait velocity: decreased   General Gait Details: Slow, mildly unsteady gait using RW to get to chair. Limited by HR.   Stairs            Wheelchair Mobility    Modified Rankin (Stroke Patients Only)       Balance Overall balance assessment: Needs assistance Sitting-balance support: Feet supported;No upper extremity supported Sitting balance-Leahy Scale: Good     Standing balance support: During functional activity;Bilateral upper extremity supported Standing balance-Leahy Scale: Poor Standing balance comment: Reliant on BUEs for support in standing.                             Pertinent Vitals/Pain Pain Assessment: No/denies pain    Home Living Family/patient expects to be discharged to:: Private residence ([facility]) Living Arrangements: Spouse/significant other Available Help at Discharge: Family;Available 24 hours/day (husband is 45 so not able to help much) Type of Home: Apartment Home Access: Level entry     Home Layout: One level Home Equipment: Walker - 2 wheels;Cane - single point      Prior Function Level of Independence: Independent with assistive device(s)         Comments: has been using RW recently     Hand Dominance   Dominant Hand: Right    Extremity/Trunk Assessment   Upper Extremity Assessment Upper Extremity Assessment: Defer to OT evaluation    Lower Extremity Assessment Lower Extremity Assessment: Generalized weakness    Cervical / Trunk Assessment Cervical / Trunk Assessment: Kyphotic  Communication   Communication: HOH  Cognition Arousal/Alertness: Awake/alert Behavior During Therapy: WFL for tasks assessed/performed Overall Cognitive Status: Within Functional Limits for tasks assessed (for basic tasks)                                 General Comments: Pt ready to die, "please let me go and stop  keeping me alive." "Can you get me a gun?"      General Comments General comments (skin integrity, edema, etc.): HR ranging from 130s-170s.    Exercises     Assessment/Plan    PT Assessment Patient needs continued PT services  PT Problem List Cardiopulmonary status limiting activity;Decreased strength;Decreased mobility;Decreased balance;Decreased activity tolerance       PT Treatment Interventions Therapeutic activities;Gait training;Therapeutic exercise;Patient/family education;Balance training;Functional mobility training    PT Goals (Current goals can be found in the Care Plan section)  Acute Rehab PT Goals Patient Stated Goal: to just let me die PT Goal Formulation: With patient Time For Goal Achievement: 03/18/17 Potential to Achieve Goals: Fair    Frequency Min 2X/week   Barriers to discharge Decreased caregiver support lives with 11 y/o spouse- not able to help her    Co-evaluation               AM-PAC PT "6 Clicks" Daily Activity  Outcome Measure Difficulty turning over in bed (including adjusting bedclothes, sheets and blankets)?: None Difficulty moving from lying on back to sitting on the side of the bed? : None Difficulty sitting down on and standing up from a chair with arms (e.g., wheelchair, bedside commode, etc,.)?: None Help needed moving to and from a bed to chair (including a wheelchair)?: A Little Help needed walking in hospital room?: A Little Help needed climbing 3-5 steps with a railing? : A Lot 6 Click Score: 20    End of Session Equipment Utilized During Treatment: Gait belt Activity Tolerance: Treatment limited secondary to medical complications (Comment) (HR) Patient left: in chair;with call bell/phone within reach;with family/visitor present (chair alarm pad under pt, but no alarm box inroom. RN notified.) Nurse Communication: Mobility status PT Visit Diagnosis: Muscle weakness (generalized) (M62.81);Unsteadiness on feet (R26.81)     Time: 2831-5176 PT Time Calculation (min) (ACUTE ONLY): 29 min   Charges:   PT Evaluation $PT Eval Moderate Complexity: 1 Procedure PT Treatments $Therapeutic Activity: 8-22 mins   PT G Codes:   PT G-Codes **NOT FOR INPATIENT CLASS** Functional Assessment Tool Used: Clinical judgement Functional Limitation: Mobility: Walking and moving around Mobility: Walking and Moving Around Current Status (H6073): At least 1 percent but less than 20 percent impaired, limited or restricted Mobility: Walking and Moving Around Goal Status [phone]): At least 1 percent but less than 20 percent impaired, limited or restricted    [place], [place], [place] [phone]    Lacie Draft 03/04/2017, 1:01 PM

## 2017-03-04 NOTE — Progress Notes (Signed)
Pt refusing IV lopressor, after multiple attempt's. Pt's hr sustaining in the 140's-170's. Pt educated on the importance of having her lopressor. Pt stated she dose not want it and is ready to die.  MD on call paged. Will continue to monitor pt.

## 2017-03-04 NOTE — Progress Notes (Signed)
Patient ID: Lori [name], female   DOB: 1926/05/22, 83 y.o.   MRN: [id number]                                                                PROGRESS NOTE                                                                                                                                                                                                             Patient Demographics:    Lori [name], is a 83 y.o. female, DOB - 10-26-1925, HEN:[id number]  Admit date - 03/02/2017   Admitting Physician Lily Kocher, MD  Outpatient Primary MD for the patient is Leanna Battles, MD  LOS - 0  Outpatient Specialists:     Chief Complaint  Patient presents with  . Atrial Fibrillation       Brief Narrative   83 y.o.woman with a history of HTN, diastolic CHF, CAD, COPD, remote tobacco use, recent diagnosis of MDS/MPN (she is on Anagrelide; followed by Dr. Burr Medico) who presents to the ED from [facility] for evaluation of palpitations and shortness of breath, onset earlier today. She denies associated chest pain. No nausea. She has increased edema in her legs. She reports that she has just finished a course of antibiotic for "double pneumonia", so she thought her breathing would be better, not worse. She also reports three syncopal episodes in the past two weeks.  ED Course:The patient was found to be in atrial fibrillation with RVR. Cardiology was asked to see the patient in the ED. Recommendations noted. Hospitalist asked to admit. At the time of my evaluation, she remained tachycardic with heart rates near 150 despite being on max dose cardizem infusion.    Subjective:    Lori [name] today has no complains but is in Afib with RVR.  No headache, No chest pain, No abdominal pain - No Nausea, No new weakness tingling or numbness, No Cough - SOB.    Assessment  & Plan :    Active Problems:   Essential hypertension   Chronic obstructive pulmonary disease (HCC)   MDS/MPN  (myelodysplastic/myeloproliferative neoplasms) (HCC)   Syncope   Tachypnea   Anxiety   Hypokalemia    Atrial fibrillation with RVR, recurrent (CHADS-Vasc score of at least 5; she was not deemed  to be a candidate for long-term anticoagulation in the past). Cont cardizem CD 360mg  po qday Lopressor 5mg  iv x1,  Start lopressor 25mg  po bid  Tachypnea resolved.  Breathing well this am.  Continue formulary equivalent for Symbicort for now.  Anxiety, which I believe is contributing to her shortness of breath. This presentation seems very similar to April. Improved after IV ativan in the ED Continue oral klonopin prn on the floor  Syncope Likely related to intermittent arrhythmia Telemetry monitoring Echo deferred; she had one in April  Mild hypokalemia Replacement ordered in the ED.  MDS/MPN Hold anagrelide while admitted.  Chronic diastolic CHF. No overt pulmonary edema on chest xray but she has leg swelling. Continue home dose of lasix for now Strict I/O Daily weights  DVT prophylaxis:Lovenox Code Status:DNR Family Communication: w patient Disposition Plan:To be determined. Consults called:Cardiology saw the patient in the ED. Admission status:Place in observation, stepdown unit.     Lab Results  Component Value Date   PLT 230 03/04/2017    Antibiotics  :  none  Anti-infectives    None        Objective:   Vitals:   03/04/17 0511 03/04/17 0617 03/04/17 0753 03/04/17 1037  BP: (!) 144/119 (!) 179/71  (!) 147/114  Pulse: 86     Resp:      Temp: 98.9 F (37.2 C)     TempSrc: Oral     SpO2: 100%  100%   Weight: 60.6 kg (133 lb 8 oz)     Height:        Wt Readings from Last 3 Encounters:  03/04/17 60.6 kg (133 lb 8 oz)  02/22/17 59 kg (130 lb)  02/16/17 59 kg (130 lb)     Intake/Output Summary (Last 24 hours) at 03/04/17 1122 Last data filed at 03/04/17 0657  Gross per 24 hour  Intake              240 ml  Output              1500 ml  Net            -1260 ml     Physical Exam  Awake Alert, Oriented X 3, No new F.N deficits, Normal affect [place].AT,PERRAL Supple Neck,No JVD, No cervical lymphadenopathy appriciated.  Symmetrical Chest wall movement, Good air movement bilaterally, CTAB Irr, irrr s1, s2  +ve B.Sounds, Abd Soft, No tenderness, No organomegaly appriciated, No rebound - guarding or rigidity. No Cyanosis, Clubbing or edema, No new Rash or bruise      Data Review:    CBC  Recent Labs Lab 02/27/17 1237 03/02/17 1504 03/03/17 0425 03/04/17 0403  WBC 4.7 5.9 5.9 5.4  HGB 9.7* 9.8* 8.6* 9.7*  HCT 30.5* 31.5* 28.3* 31.5*  PLT 348 292 244 230  MCV 95.9 96.6 98.3 96.9  MCH 30.5 30.1 29.9 29.8  MCHC 31.8 31.1 30.4 30.8  RDW 15.7* 16.6* 17.1* 17.2*  LYMPHSABS 1.7 1.8  --   --   MONOABS 0.1 0.2  --   --   EOSABS 0.0 0.0  --   --   BASOSABS 0.0 0.1  --   --     Chemistries   Recent Labs Lab 03/02/17 1504 03/03/17 0425 03/04/17 0403  NA 138 136 138  K 3.3* 3.8 3.6  CL 108 106 105  CO2 23 22 24   GLUCOSE 126* 117* 92  BUN 24* 21* 13  CREATININE 0.81 0.76 0.68  CALCIUM 8.4* 8.1*  8.4*  MG 2.0  --   --   AST 23  --  19  ALT 23  --  18  ALKPHOS 65  --  58  BILITOT 0.7  --  1.1   ------------------------------------------------------------------------------------------------------------------ No results for input(s): CHOL, HDL, LDLCALC, TRIG, CHOLHDL, LDLDIRECT in the last 72 hours.  Lab Results  Component Value Date   HGBA1C (H) 10/04/2010    5.7 (NOTE)                                                                       According to the ADA Clinical Practice Recommendations for 2011, when HbA1c is used as a screening test:   >=6.5%   Diagnostic of Diabetes Mellitus           (if abnormal result  is confirmed)  5.7-6.4%   Increased risk of developing Diabetes Mellitus  References:Diagnosis and Classification of Diabetes Mellitus,Diabetes WCHE,5277,82(UMPNT 1):S62-S69 and Standards  of Medical Care in         Diabetes - 2011,Diabetes IRWE,3154,00  (Suppl 1):S11-S61.   ------------------------------------------------------------------------------------------------------------------  Recent Labs  03/02/17 2108  TSH 2.712   ------------------------------------------------------------------------------------------------------------------ No results for input(s): VITAMINB12, FOLATE, FERRITIN, TIBC, IRON, RETICCTPCT in the last 72 hours.  Coagulation profile No results for input(s): INR, PROTIME in the last 168 hours.  No results for input(s): DDIMER in the last 72 hours.  Cardiac Enzymes No results for input(s): CKMB, TROPONINI, MYOGLOBIN in the last 168 hours.  Invalid input(s): CK ------------------------------------------------------------------------------------------------------------------    Component Value Date/Time   BNP 458.3 (H) 03/02/2017 1504   BNP 77.9 04/11/2016 1537    Inpatient Medications  Scheduled Meds: . aspirin EC  81 mg Oral Daily  . diltiazem  360 mg Oral Daily  . enoxaparin (LOVENOX) injection  40 mg Subcutaneous Q24H  . furosemide  20 mg Oral Daily  . latanoprost  1 drop Both Eyes QHS  . losartan  25 mg Oral Daily  . mometasone-formoterol  2 puff Inhalation BID  . polyvinyl alcohol  1 drop Both Eyes TID   Continuous Infusions: PRN Meds:.acetaminophen, clonazePAM, hydrALAZINE, levalbuterol, metoprolol tartrate, ondansetron (ZOFRAN) IV, zolpidem  Micro Results Recent Results (from the past 240 hour(s))  TECHNOLOGIST REVIEW     Status: None   Collection Time: 02/27/17 12:37 PM  Result Value Ref Range Status   Technologist Review   Final    Few Metas and Myelocytes present, 2% nRBCs, dysplastic neutrophils, large and giant platelets   MRSA PCR Screening     Status: None   Collection Time: 03/02/17  8:54 PM  Result Value Ref Range Status   MRSA by PCR NEGATIVE NEGATIVE Final    Comment:        The GeneXpert MRSA Assay  (FDA approved for NASAL specimens only), is one component of a comprehensive MRSA colonization surveillance program. It is not intended to diagnose MRSA infection nor to guide or monitor treatment for MRSA infections.     Radiology Reports Dg Chest 2 View  Result Date: 02/22/2017 CLINICAL DATA:  Dyspnea since 6 p.m. with chest pain. EXAM: CHEST  2 VIEW COMPARISON:  01/04/2017 CT and CXR FINDINGS: The patient is slightly tilted to the right and rotated on current exam. Heart is  borderline enlarged. There is mild aortic atherosclerosis without aneurysm. Hazy opacities at the lung bases are in keeping with new layering small to moderate pleural effusions bilaterally. Hazy appearance of the lungs may also be secondary mild pulmonary edema. High-riding right humeral head. No acute osseous abnormality. IMPRESSION: Findings suggest mild pulmonary edema with new layering small to moderate posterior pleural effusions. Aortic atherosclerosis. Electronically Signed   By: Ashley Royalty M.D.   On: 02/22/2017 20:23   Dg Chest Port 1 View  Result Date: 03/02/2017 CLINICAL DATA:  Shortness of breath tachycardia and productive cough. EXAM: PORTABLE CHEST 1 VIEW COMPARISON:  February 22, 2017. FINDINGS: The heart size and mediastinal contours are stable. The heart size is mildly enlarged. There is minimal increased pulmonary interstitium. There is no focal pneumonia or pleural effusion. The visualized skeletal structures are stable. IMPRESSION: Mild cardiomegaly. Minimal increased pulmonary interstitium unchanged. Electronically Signed   By: Abelardo Diesel M.D.   On: 03/02/2017 15:23    Time Spent in minutes  30   Jani Gravel M.D on 03/04/2017 at 11:22 AM  Between 7am to 7pm - Pager - [phone]  After 7pm go to [email or web address] - password [facility]  [facility] -  Office  [phone]

## 2017-03-04 NOTE — Progress Notes (Signed)
Spoke wth pt's daughter Pete Glatter and pattie and made them aware there Mom was refusing medication. Patient spoke with her daughters and made them aware she was wanting to refuse her medication. Pt stated she did not want me to call her husband and that she made him aware earlier this morning when he came to visit that she no longer wanted to take her medication. Will cont to monitor pt.

## 2017-03-04 NOTE — Progress Notes (Signed)
PT Cancellation Note  Patient Details Name: Gisela Lea Tunks MRN: [id number] DOB: [birth year]   Cancelled Treatment:    Reason Eval/Treat Not Completed: Patient not medically ready Pt on bedrest. PLease increase activity orders prior to PT evaluation.   Marguarite Arbour A Arshia Spellman 03/04/2017, 11:15 AM Wray Kearns, PT, DPT [phone]

## 2017-03-05 DIAGNOSIS — I272 Pulmonary hypertension, unspecified: Secondary | ICD-10-CM | POA: Diagnosis present

## 2017-03-05 DIAGNOSIS — K5730 Diverticulosis of large intestine without perforation or abscess without bleeding: Secondary | ICD-10-CM | POA: Diagnosis present

## 2017-03-05 DIAGNOSIS — Z79899 Other long term (current) drug therapy: Secondary | ICD-10-CM | POA: Diagnosis not present

## 2017-03-05 DIAGNOSIS — I110 Hypertensive heart disease with heart failure: Secondary | ICD-10-CM | POA: Diagnosis present

## 2017-03-05 DIAGNOSIS — J309 Allergic rhinitis, unspecified: Secondary | ICD-10-CM | POA: Diagnosis present

## 2017-03-05 DIAGNOSIS — F419 Anxiety disorder, unspecified: Secondary | ICD-10-CM | POA: Diagnosis present

## 2017-03-05 DIAGNOSIS — Z880 Allergy status to penicillin: Secondary | ICD-10-CM | POA: Diagnosis not present

## 2017-03-05 DIAGNOSIS — I2510 Atherosclerotic heart disease of native coronary artery without angina pectoris: Secondary | ICD-10-CM | POA: Diagnosis present

## 2017-03-05 DIAGNOSIS — I4891 Unspecified atrial fibrillation: Secondary | ICD-10-CM | POA: Diagnosis not present

## 2017-03-05 DIAGNOSIS — Z91011 Allergy to milk products: Secondary | ICD-10-CM | POA: Diagnosis not present

## 2017-03-05 DIAGNOSIS — Z66 Do not resuscitate: Secondary | ICD-10-CM | POA: Diagnosis present

## 2017-03-05 DIAGNOSIS — R0682 Tachypnea, not elsewhere classified: Secondary | ICD-10-CM | POA: Diagnosis present

## 2017-03-05 DIAGNOSIS — Z882 Allergy status to sulfonamides status: Secondary | ICD-10-CM | POA: Diagnosis not present

## 2017-03-05 DIAGNOSIS — J441 Chronic obstructive pulmonary disease with (acute) exacerbation: Secondary | ICD-10-CM | POA: Diagnosis present

## 2017-03-05 DIAGNOSIS — K589 Irritable bowel syndrome without diarrhea: Secondary | ICD-10-CM | POA: Diagnosis present

## 2017-03-05 DIAGNOSIS — Z888 Allergy status to other drugs, medicaments and biological substances status: Secondary | ICD-10-CM | POA: Diagnosis not present

## 2017-03-05 DIAGNOSIS — Z881 Allergy status to other antibiotic agents status: Secondary | ICD-10-CM | POA: Diagnosis not present

## 2017-03-05 DIAGNOSIS — R55 Syncope and collapse: Secondary | ICD-10-CM | POA: Diagnosis not present

## 2017-03-05 DIAGNOSIS — I482 Chronic atrial fibrillation: Secondary | ICD-10-CM | POA: Diagnosis not present

## 2017-03-05 DIAGNOSIS — D469 Myelodysplastic syndrome, unspecified: Secondary | ICD-10-CM | POA: Diagnosis present

## 2017-03-05 DIAGNOSIS — I700 Atherosclerosis of aorta: Secondary | ICD-10-CM | POA: Diagnosis present

## 2017-03-05 DIAGNOSIS — E876 Hypokalemia: Secondary | ICD-10-CM | POA: Diagnosis not present

## 2017-03-05 DIAGNOSIS — Z885 Allergy status to narcotic agent status: Secondary | ICD-10-CM | POA: Diagnosis not present

## 2017-03-05 DIAGNOSIS — Z96651 Presence of right artificial knee joint: Secondary | ICD-10-CM | POA: Diagnosis present

## 2017-03-05 DIAGNOSIS — I10 Essential (primary) hypertension: Secondary | ICD-10-CM | POA: Diagnosis not present

## 2017-03-05 DIAGNOSIS — I5032 Chronic diastolic (congestive) heart failure: Secondary | ICD-10-CM | POA: Diagnosis present

## 2017-03-05 DIAGNOSIS — Z7952 Long term (current) use of systemic steroids: Secondary | ICD-10-CM | POA: Diagnosis not present

## 2017-03-05 DIAGNOSIS — J189 Pneumonia, unspecified organism: Secondary | ICD-10-CM | POA: Diagnosis not present

## 2017-03-05 LAB — CBC
HEMATOCRIT: 28.5 % — AB (ref 36.0–46.0)
HEMOGLOBIN: 8.7 g/dL — AB (ref 12.0–15.0)
MCH: 29.9 pg (ref 26.0–34.0)
MCHC: 30.5 g/dL (ref 30.0–36.0)
MCV: 97.9 fL (ref 78.0–100.0)
PLATELETS: 184 10*3/uL (ref 150–400)
RBC: 2.91 MIL/uL — AB (ref 3.87–5.11)
RDW: 17.4 % — ABNORMAL HIGH (ref 11.5–15.5)
WBC: 4.6 10*3/uL (ref 4.0–10.5)

## 2017-03-05 LAB — COMPREHENSIVE METABOLIC PANEL
ALK PHOS: 50 U/L (ref 38–126)
ALT: 14 U/L (ref 14–54)
AST: 19 U/L (ref 15–41)
Albumin: 2.6 g/dL — ABNORMAL LOW (ref 3.5–5.0)
Anion gap: 7 (ref 5–15)
BILIRUBIN TOTAL: 1.2 mg/dL (ref 0.3–1.2)
BUN: 9 mg/dL (ref 6–20)
CALCIUM: 7.9 mg/dL — AB (ref 8.9–10.3)
CO2: 29 mmol/L (ref 22–32)
CREATININE: 0.74 mg/dL (ref 0.44–1.00)
Chloride: 101 mmol/L (ref 101–111)
Glucose, Bld: 80 mg/dL (ref 65–99)
Potassium: 3.2 mmol/L — ABNORMAL LOW (ref 3.5–5.1)
Sodium: 137 mmol/L (ref 135–145)
TOTAL PROTEIN: 4.8 g/dL — AB (ref 6.5–8.1)

## 2017-03-05 MED ORDER — POTASSIUM CHLORIDE CRYS ER 20 MEQ PO TBCR
40.0000 meq | EXTENDED_RELEASE_TABLET | Freq: Once | ORAL | Status: AC
  Administered 2017-03-05: 40 meq via ORAL
  Filled 2017-03-05: qty 2

## 2017-03-05 MED ORDER — PRO-STAT SUGAR FREE PO LIQD
30.0000 mL | Freq: Two times a day (BID) | ORAL | Status: DC
  Administered 2017-03-05 – 2017-03-07 (×5): 30 mL via ORAL
  Filled 2017-03-05 (×4): qty 30

## 2017-03-05 MED ORDER — PHENOL 1.4 % MT LIQD
1.0000 | OROMUCOSAL | Status: DC | PRN
  Administered 2017-03-06: 1 via OROMUCOSAL
  Filled 2017-03-05: qty 177

## 2017-03-05 MED ORDER — MENTHOL 3 MG MT LOZG
1.0000 | LOZENGE | OROMUCOSAL | Status: DC | PRN
  Filled 2017-03-05: qty 9

## 2017-03-05 MED ORDER — METOPROLOL TARTRATE 25 MG PO TABS
37.5000 mg | ORAL_TABLET | Freq: Two times a day (BID) | ORAL | Status: DC
  Administered 2017-03-05 – 2017-03-06 (×2): 37.5 mg via ORAL
  Filled 2017-03-05 (×2): qty 1

## 2017-03-05 MED ORDER — CEPASTAT 14.5 MG MT LOZG: 1.0000 | LOZENGE | OROMUCOSAL | Status: DC | PRN
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
## 2017-03-05 NOTE — Progress Notes (Signed)
Patient ID: Lori [name], female   DOB: 09/06/1926, 83 y.o.   MRN: [id number]                                                                PROGRESS NOTE                                                                                                                                                                                                             Patient Demographics:    Lori [name], is a 83 y.o. female, DOB - 09-Oct-1925, VEL:[id number]  Admit date - 03/02/2017   Admitting Physician Lily Kocher, MD  Outpatient Primary MD for the patient is Leanna Battles, MD  LOS - 0  Outpatient Specialists:     Chief Complaint  Patient presents with  . Atrial Fibrillation       Brief Narrative    83 y.o.woman with a history of HTN, diastolic CHF, CAD, COPD, remote tobacco use, recent diagnosis of MDS/MPN (she is on Anagrelide; followed by Dr. Burr Medico) who presents to the ED from [facility] for evaluation of palpitations and shortness of breath, onset earlier today. She denies associated chest pain. No nausea. She has increased edema in her legs. She reports that she has just finished a course of antibiotic for "double pneumonia", so she thought her breathing would be better, not worse. She also reports three syncopal episodes in the past two weeks.  ED Course:The patient was found to be in atrial fibrillation with RVR. Cardiology was asked to see the patient in the ED. Recommendations noted. Hospitalist asked to admit. At the time of my evaluation, she remained tachycardic with heart rates near 150 despite being on max dose cardizem infusion.    Subjective:    [name] today feeling ok. Had afib with rvr yesterday due to noncompliance with medication.  Now appears to have converted to sinus.   Denies cp, palp, sob.    No headache, No chest pain, No abdominal pain - No Nausea, No new weakness tingling or numbness, No Cough    Assessment  & Plan :    Active Problems:   Essential  hypertension   Chronic obstructive pulmonary disease (HCC)   MDS/MPN (myelodysplastic/myeloproliferative neoplasms) (HCC)   Syncope   Tachypnea   Anxiety   Hypokalemia  Atrial fibrillation with RVR, recurrent (CHADS-Vasc score of at least 5; she was not deemed to be a candidate for long-term anticoagulation in the past). Pt refused medication yesterday and this caused her to be in Afib with RVR, since last nite has been compliant with her medication. Cont cardizem CD 360mg  po qday increase lopressor 37.5mg  po bid Not a candidate for anticoagulation due to fall risk  Tachypnea resolved.  Breathing well this am.  Continue formulary equivalent for Symbicort for now.  Anxiety, which I believe is contributing to her shortness of breath. This presentation seems very similar to April. Improved after IV ativan in the ED Continue oral klonopin prn on the floor  Syncope Likely related to intermittent arrhythmia Telemetry monitoring Echo deferred; she had one in April  Mild hypokalemia Replacement ordered in the ED.  MDS/MPN Hold anagrelide while admitted.  Chronic diastolic CHF. No overt pulmonary edema on chest xray but she has leg swelling. Continue home dose of lasix for now Strict I/O Daily weights  DVT prophylaxis:Lovenox Code Status:DNR Family Communication:w patient Disposition Plan:To be determined. Consults called:Cardiology saw the patient in the ED. Admission status: change to inpatient     Lab Results  Component Value Date   PLT 184 03/05/2017    Antibiotics  :  none  Anti-infectives    None        Objective:   Vitals:   03/05/17 0003 03/05/17 0527 03/05/17 0740 03/05/17 0900  BP: (!) 155/80 (!) 156/65 136/60   Pulse: 76  83   Resp: (!) 25 20 (!) 24   Temp: 98.1 F (36.7 C) 98.6 F (37 C) 98.8 F (37.1 C)   TempSrc: Oral Oral Oral   SpO2: 100% 94% 94% 93%  Weight:  59.6 kg (131 lb 8 oz)    Height:  5\' 5"  (1.651 m)       Wt Readings from Last 3 Encounters:  03/05/17 59.6 kg (131 lb 8 oz)  02/22/17 59 kg (130 lb)  02/16/17 59 kg (130 lb)     Intake/Output Summary (Last 24 hours) at 03/05/17 0906 Last data filed at 03/05/17 0006  Gross per 24 hour  Intake              490 ml  Output              100 ml  Net              390 ml     Physical Exam  Awake Alert, Oriented X 3, No new F.N deficits, Normal affect [place].AT,PERRAL Supple Neck,No JVD, No cervical lymphadenopathy appriciated.  Symmetrical Chest wall movement, Good air movement bilaterally, CTAB RRR,No Gallops,Rubs or new Murmurs, No Parasternal Heave +ve B.Sounds, Abd Soft, No tenderness, No organomegaly appriciated, No rebound - guarding or rigidity. No Cyanosis, Clubbing or edema, No new Rash or bruise      Data Review:    CBC  Recent Labs Lab 02/27/17 1237 03/02/17 1504 03/03/17 0425 03/04/17 0403 03/05/17 0326  WBC 4.7 5.9 5.9 5.4 4.6  HGB 9.7* 9.8* 8.6* 9.7* 8.7*  HCT 30.5* 31.5* 28.3* 31.5* 28.5*  PLT 348 292 244 230 184  MCV 95.9 96.6 98.3 96.9 97.9  MCH 30.5 30.1 29.9 29.8 29.9  MCHC 31.8 31.1 30.4 30.8 30.5  RDW 15.7* 16.6* 17.1* 17.2* 17.4*  LYMPHSABS 1.7 1.8  --   --   --   MONOABS 0.1 0.2  --   --   --   EOSABS  0.0 0.0  --   --   --   BASOSABS 0.0 0.1  --   --   --     Chemistries   Recent Labs Lab 03/02/17 1504 03/03/17 0425 03/04/17 0403 03/05/17 0326  NA 138 136 138 137  K 3.3* 3.8 3.6 3.2*  CL 108 106 105 101  CO2 23 22 24 29   GLUCOSE 126* 117* 92 80  BUN 24* 21* 13 9  CREATININE 0.81 0.76 0.68 0.74  CALCIUM 8.4* 8.1* 8.4* 7.9*  MG 2.0  --   --   --   AST 23  --  19 19  ALT 23  --  18 14  ALKPHOS 65  --  58 50  BILITOT 0.7  --  1.1 1.2   ------------------------------------------------------------------------------------------------------------------ No results for input(s): CHOL, HDL, LDLCALC, TRIG, CHOLHDL, LDLDIRECT in the last 72 hours.  Lab Results  Component Value Date    HGBA1C (H) 10/04/2010    5.7 (NOTE)                                                                       According to the ADA Clinical Practice Recommendations for 2011, when HbA1c is used as a screening test:   >=6.5%   Diagnostic of Diabetes Mellitus           (if abnormal result  is confirmed)  5.7-6.4%   Increased risk of developing Diabetes Mellitus  References:Diagnosis and Classification of Diabetes Mellitus,Diabetes MWNU,2725,36(UYQIH 1):S62-S69 and Standards of Medical Care in         Diabetes - 2011,Diabetes KVQQ,5956,38  (Suppl 1):S11-S61.   ------------------------------------------------------------------------------------------------------------------  Recent Labs  03/02/17 2108  TSH 2.712   ------------------------------------------------------------------------------------------------------------------ No results for input(s): VITAMINB12, FOLATE, FERRITIN, TIBC, IRON, RETICCTPCT in the last 72 hours.  Coagulation profile No results for input(s): INR, PROTIME in the last 168 hours.  No results for input(s): DDIMER in the last 72 hours.  Cardiac Enzymes No results for input(s): CKMB, TROPONINI, MYOGLOBIN in the last 168 hours.  Invalid input(s): CK ------------------------------------------------------------------------------------------------------------------    Component Value Date/Time   BNP 458.3 (H) 03/02/2017 1504   BNP 77.9 04/11/2016 1537    Inpatient Medications  Scheduled Meds: . aspirin EC  81 mg Oral Daily  . diltiazem  360 mg Oral Daily  . enoxaparin (LOVENOX) injection  40 mg Subcutaneous Q24H  . furosemide  20 mg Oral Daily  . latanoprost  1 drop Both Eyes QHS  . losartan  25 mg Oral Daily  . metoprolol tartrate  5 mg Intravenous Once  . metoprolol tartrate  25 mg Oral BID  . mometasone-formoterol  2 puff Inhalation BID  . polyvinyl alcohol  1 drop Both Eyes TID   Continuous Infusions: PRN Meds:.acetaminophen, clonazePAM, hydrALAZINE,  levalbuterol, ondansetron (ZOFRAN) IV, zolpidem  Micro Results Recent Results (from the past 240 hour(s))  TECHNOLOGIST REVIEW     Status: None   Collection Time: 02/27/17 12:37 PM  Result Value Ref Range Status   Technologist Review   Final    Few Metas and Myelocytes present, 2% nRBCs, dysplastic neutrophils, large and giant platelets   MRSA PCR Screening     Status: None   Collection Time: 03/02/17  8:54 PM  Result  Value Ref Range Status   MRSA by PCR NEGATIVE NEGATIVE Final    Comment:        The GeneXpert MRSA Assay (FDA approved for NASAL specimens only), is one component of a comprehensive MRSA colonization surveillance program. It is not intended to diagnose MRSA infection nor to guide or monitor treatment for MRSA infections.     Radiology Reports Dg Chest 2 View  Result Date: 02/22/2017 CLINICAL DATA:  Dyspnea since 6 p.m. with chest pain. EXAM: CHEST  2 VIEW COMPARISON:  01/04/2017 CT and CXR FINDINGS: The patient is slightly tilted to the right and rotated on current exam. Heart is borderline enlarged. There is mild aortic atherosclerosis without aneurysm. Hazy opacities at the lung bases are in keeping with new layering small to moderate pleural effusions bilaterally. Hazy appearance of the lungs may also be secondary mild pulmonary edema. High-riding right humeral head. No acute osseous abnormality. IMPRESSION: Findings suggest mild pulmonary edema with new layering small to moderate posterior pleural effusions. Aortic atherosclerosis. Electronically Signed   By: Ashley Royalty M.D.   On: 02/22/2017 20:23   Dg Chest Port 1 View  Result Date: 03/02/2017 CLINICAL DATA:  Shortness of breath tachycardia and productive cough. EXAM: PORTABLE CHEST 1 VIEW COMPARISON:  February 22, 2017. FINDINGS: The heart size and mediastinal contours are stable. The heart size is mildly enlarged. There is minimal increased pulmonary interstitium. There is no focal pneumonia or pleural effusion. The  visualized skeletal structures are stable. IMPRESSION: Mild cardiomegaly. Minimal increased pulmonary interstitium unchanged. Electronically Signed   By: Abelardo Diesel M.D.   On: 03/02/2017 15:23    Time Spent in minutes  30   Jani Gravel M.D on 03/05/2017 at 9:06 AM  Between 7am to 7pm - Pager - [phone]  After 7pm go to [email or web address] - password [facility]  [facility] -  Office  [phone]

## 2017-03-05 NOTE — Plan of Care (Signed)
Problem: Safety: Goal: Ability to remain free from injury will improve Outcome: Progressing Patient has remained free from injury. Bed alarm on.   Problem: Skin Integrity: Goal: Risk for impaired skin integrity will decrease Outcome: Progressing Incontinence Associated Dermatitis, cream applied to bottom when cleaning patient.   Comments: Patient stating that she no longer wants to take medication and "is ready" to die. Nurse spoke with patient regarding medications for patient to be comfortable and rest. Patient in agreeance to take medications if they'll make her feel better. HR was in the 140's upon shift, Lopressor given, HR maintained 70-80's throughout shift. Zofran given for upset stomach and both Ambien & Klonopin given for anxiety/rest.

## 2017-03-06 LAB — COMPREHENSIVE METABOLIC PANEL
ALK PHOS: 48 U/L (ref 38–126)
ALT: 15 U/L (ref 14–54)
ANION GAP: 8 (ref 5–15)
AST: 14 U/L — ABNORMAL LOW (ref 15–41)
Albumin: 2.7 g/dL — ABNORMAL LOW (ref 3.5–5.0)
BUN: 15 mg/dL (ref 6–20)
CALCIUM: 7.8 mg/dL — AB (ref 8.9–10.3)
CO2: 27 mmol/L (ref 22–32)
CREATININE: 0.67 mg/dL (ref 0.44–1.00)
Chloride: 98 mmol/L — ABNORMAL LOW (ref 101–111)
Glucose, Bld: 94 mg/dL (ref 65–99)
Potassium: 3.3 mmol/L — ABNORMAL LOW (ref 3.5–5.1)
Sodium: 133 mmol/L — ABNORMAL LOW (ref 135–145)
Total Bilirubin: 0.8 mg/dL (ref 0.3–1.2)
Total Protein: 4.8 g/dL — ABNORMAL LOW (ref 6.5–8.1)

## 2017-03-06 LAB — CBC
HCT: 28.1 % — ABNORMAL LOW (ref 36.0–46.0)
HEMOGLOBIN: 8.6 g/dL — AB (ref 12.0–15.0)
MCH: 29.7 pg (ref 26.0–34.0)
MCHC: 30.6 g/dL (ref 30.0–36.0)
MCV: 96.9 fL (ref 78.0–100.0)
PLATELETS: 230 10*3/uL (ref 150–400)
RBC: 2.9 MIL/uL — AB (ref 3.87–5.11)
RDW: 17.1 % — ABNORMAL HIGH (ref 11.5–15.5)
WBC: 5.6 10*3/uL (ref 4.0–10.5)

## 2017-03-06 MED ORDER — POTASSIUM CHLORIDE CRYS ER 20 MEQ PO TBCR
30.0000 meq | EXTENDED_RELEASE_TABLET | Freq: Once | ORAL | Status: AC
  Administered 2017-03-06: 11:00:00 30 meq via ORAL
  Filled 2017-03-06: qty 1

## 2017-03-06 MED ORDER — METOPROLOL TARTRATE 50 MG PO TABS
50.0000 mg | ORAL_TABLET | Freq: Two times a day (BID) | ORAL | Status: DC
  Administered 2017-03-06 – 2017-03-07 (×2): 50 mg via ORAL
  Filled 2017-03-06 (×2): qty 1

## 2017-03-06 MED ORDER — LOSARTAN POTASSIUM 25 MG PO TABS
25.0000 mg | ORAL_TABLET | Freq: Once | ORAL | Status: AC
  Administered 2017-03-06: 25 mg via ORAL
  Filled 2017-03-06: qty 1

## 2017-03-06 NOTE — Progress Notes (Signed)
OT Cancellation Note  Patient Details Name: Lori [name] MRN: [id number] DOB: April 27, 1926   Cancelled Treatment:    Reason Eval/Treat Not Completed: Fatigue/lethargy limiting ability to participate. Will follow.  Malka So 03/06/2017, 3:05 PM  [phone]

## 2017-03-06 NOTE — Progress Notes (Signed)
Patient ID: Lori [name], female   DOB: [birth year], 83 y.o.   MRN: [id number]                                                                PROGRESS NOTE                                                                                                                                                                                                             Patient Demographics:    Lori [name], is a 83 y.o. female, DOB - 1926/03/27, WUJ:[id number]  Admit date - 03/02/2017   Admitting Physician Lily Kocher, MD  Outpatient Primary MD for the patient is Leanna Battles, MD  LOS - 1  Outpatient Specialists:  Chief Complaint  Patient presents with  . Atrial Fibrillation       Brief Narrative  Brief Narrative    83 y.o.woman with a history of HTN, diastolic CHF, CAD, COPD, remote tobacco use, recent diagnosis of MDS/MPN (she is on Anagrelide; followed by Dr. Burr Medico) who presents to the ED from [facility] for evaluation of palpitations and shortness of breath, onset earlier today. She denies associated chest pain. No nausea. She has increased edema in her legs. She reports that she has just finished a course of antibiotic for "double pneumonia", so she thought her breathing would be better, not worse. She also reports three syncopal episodes in the past two weeks.  ED Course:The patient was found to be in atrial fibrillation with RVR. Cardiology was asked to see the patient in the ED. Recommendations noted. Hospitalist asked to admit. At the time of my evaluation, she remained tachycardic with heart rates near 150 despite being on max dose cardizem infusion.  Subjective:    [facility] today denies cp, palp, sob, lower ext edema. Hr better.  No abdominal pain - No Nausea, No new weakness tingling or numbness, No Cough    Assessment  & Plan :    Active Problems:   Essential hypertension   Chronic obstructive pulmonary disease (HCC)   Atrial fibrillation with RVR (HCC)   MDS/MPN  (myelodysplastic/myeloproliferative neoplasms) (HCC)   Syncope   Tachypnea   Anxiety   Hypokalemia   Atrial fibrillation with RVR, recurrent (CHADS-Vasc score of at least 5; she was not deemed to be  a candidate for long-term anticoagulation in the past). Pt refused medication yesterday and this caused her to be in Afib with RVR, since last nite has been compliant with her medication. Cont cardizem CD 360mg  po qday increase lopressor 50mg  po bid Not a candidate for anticoagulation due to fall risk  Hypokalemia Replete Check cmp in am  Hypertension uncontrolled Increase lopressor today  Tachypnea resolved.  Breathing well this am.  Continue formulary equivalent for Symbicort for now.  Anxiety, which I believe is contributing to her shortness of breath. This presentation seems very similar to April. Improved after IV ativan in the ED Continue oral klonopin prn on the floor  Syncope Likely related to intermittent arrhythmia Telemetry monitoring Echo deferred; she had one in April  Mild hypokalemia Replacement ordered in the ED.  MDS/MPN Hold anagrelide while admitted.  Chronic diastolic CHF. No overt pulmonary edema on chest xray but she has leg swelling. Continue home dose of lasix for now Strict I/O Daily weights  DVT prophylaxis:Lovenox Code Status:DNR Family Communication:w patient Disposition Plan:  SNF tomorrow Consults called:Cardiology saw the patient in the ED. Admission status: change to inpatient    Lab Results  Component Value Date   PLT 230 03/06/2017      Anti-infectives    None        Objective:   Vitals:   03/06/17 0145 03/06/17 0444 03/06/17 0801 03/06/17 0807  BP: (!) 163/68 (!) 148/60 (!) 158/65 (!) 158/65  Pulse:  79  84  Resp: (!) 21 19    Temp: 98.2 F (36.8 C) 98.1 F (36.7 C)    TempSrc: Oral     SpO2: 95% 96%    Weight:  57.5 kg (126 lb 12.8 oz)    Height:        Wt Readings from Last 3 Encounters:    03/06/17 57.5 kg (126 lb 12.8 oz)  02/22/17 59 kg (130 lb)  02/16/17 59 kg (130 lb)     Intake/Output Summary (Last 24 hours) at 03/06/17 0820 Last data filed at 03/06/17 0300  Gross per 24 hour  Intake              450 ml  Output              400 ml  Net               50 ml     Physical Exam  Awake Alert, Oriented X 3, No new F.N deficits, Normal affect [place].AT,PERRAL Supple Neck,No JVD, No cervical lymphadenopathy appriciated.  Symmetrical Chest wall movement, Good air movement bilaterally, CTAB Irr, irr, s1, s2   No Parasternal Heave +ve B.Sounds, Abd Soft, No tenderness, No organomegaly appriciated, No rebound - guarding or rigidity. No Cyanosis, Clubbing or edema, No new Rash or bruise      Data Review:    CBC  Recent Labs Lab 02/27/17 1237 03/02/17 1504 03/03/17 0425 03/04/17 0403 03/05/17 0326 03/06/17 0226  WBC 4.7 5.9 5.9 5.4 4.6 5.6  HGB 9.7* 9.8* 8.6* 9.7* 8.7* 8.6*  HCT 30.5* 31.5* 28.3* 31.5* 28.5* 28.1*  PLT 348 292 244 230 184 230  MCV 95.9 96.6 98.3 96.9 97.9 96.9  MCH 30.5 30.1 29.9 29.8 29.9 29.7  MCHC 31.8 31.1 30.4 30.8 30.5 30.6  RDW 15.7* 16.6* 17.1* 17.2* 17.4* 17.1*  LYMPHSABS 1.7 1.8  --   --   --   --   MONOABS 0.1 0.2  --   --   --   --  EOSABS 0.0 0.0  --   --   --   --   BASOSABS 0.0 0.1  --   --   --   --     Chemistries   Recent Labs Lab 03/02/17 1504 03/03/17 0425 03/04/17 0403 03/05/17 0326 03/06/17 0226  NA 138 136 138 137 133*  K 3.3* 3.8 3.6 3.2* 3.3*  CL 108 106 105 101 98*  CO2 23 22 24 29 27   GLUCOSE 126* 117* 92 80 94  BUN 24* 21* 13 9 15   CREATININE 0.81 0.76 0.68 0.74 0.67  CALCIUM 8.4* 8.1* 8.4* 7.9* 7.8*  MG 2.0  --   --   --   --   AST 23  --  19 19 14*  ALT 23  --  18 14 15   ALKPHOS 65  --  58 50 48  BILITOT 0.7  --  1.1 1.2 0.8   ------------------------------------------------------------------------------------------------------------------ No results for input(s): CHOL, HDL, LDLCALC, TRIG,  CHOLHDL, LDLDIRECT in the last 72 hours.  Lab Results  Component Value Date   HGBA1C (H) 10/04/2010    5.7 (NOTE)                                                                       According to the ADA Clinical Practice Recommendations for 2011, when HbA1c is used as a screening test:   >=6.5%   Diagnostic of Diabetes Mellitus           (if abnormal result  is confirmed)  5.7-6.4%   Increased risk of developing Diabetes Mellitus  References:Diagnosis and Classification of Diabetes Mellitus,Diabetes Care,2011,34(Suppl 1):S62-S69 and Standards of Medical Care in         Diabetes - 2011,Diabetes NLGX,2119,41  (Suppl 1):S11-S61.   ------------------------------------------------------------------------------------------------------------------ No results for input(s): TSH, T4TOTAL, T3FREE, THYROIDAB in the last 72 hours.  Invalid input(s): FREET3 ------------------------------------------------------------------------------------------------------------------ No results for input(s): VITAMINB12, FOLATE, FERRITIN, TIBC, IRON, RETICCTPCT in the last 72 hours.  Coagulation profile No results for input(s): INR, PROTIME in the last 168 hours.  No results for input(s): DDIMER in the last 72 hours.  Cardiac Enzymes No results for input(s): CKMB, TROPONINI, MYOGLOBIN in the last 168 hours.  Invalid input(s): CK ------------------------------------------------------------------------------------------------------------------    Component Value Date/Time   BNP 458.3 (H) 03/02/2017 1504   BNP 77.9 04/11/2016 1537    Inpatient Medications  Scheduled Meds: . aspirin EC  81 mg Oral Daily  . diltiazem  360 mg Oral Daily  . enoxaparin (LOVENOX) injection  40 mg Subcutaneous Q24H  . feeding supplement (PRO-STAT SUGAR FREE 64)  30 mL Oral BID  . furosemide  20 mg Oral Daily  . latanoprost  1 drop Both Eyes QHS  . losartan  25 mg Oral Daily  . metoprolol tartrate  50 mg Oral BID  .  mometasone-formoterol  2 puff Inhalation BID  . polyvinyl alcohol  1 drop Both Eyes TID   Continuous Infusions: PRN Meds:.acetaminophen, clonazePAM, hydrALAZINE, levalbuterol, menthol-cetylpyridinium, ondansetron (ZOFRAN) IV, phenol, zolpidem  Micro Results Recent Results (from the past 240 hour(s))  TECHNOLOGIST REVIEW     Status: None   Collection Time: 02/27/17 12:37 PM  Result Value Ref Range Status   Technologist Review   Final  Few Metas and Myelocytes present, 2% nRBCs, dysplastic neutrophils, large and giant platelets   MRSA PCR Screening     Status: None   Collection Time: 03/02/17  8:54 PM  Result Value Ref Range Status   MRSA by PCR NEGATIVE NEGATIVE Final    Comment:        The GeneXpert MRSA Assay (FDA approved for NASAL specimens only), is one component of a comprehensive MRSA colonization surveillance program. It is not intended to diagnose MRSA infection nor to guide or monitor treatment for MRSA infections.     Radiology Reports Dg Chest 2 View  Result Date: 02/22/2017 CLINICAL DATA:  Dyspnea since 6 p.m. with chest pain. EXAM: CHEST  2 VIEW COMPARISON:  01/04/2017 CT and CXR FINDINGS: The patient is slightly tilted to the right and rotated on current exam. Heart is borderline enlarged. There is mild aortic atherosclerosis without aneurysm. Hazy opacities at the lung bases are in keeping with new layering small to moderate pleural effusions bilaterally. Hazy appearance of the lungs may also be secondary mild pulmonary edema. High-riding right humeral head. No acute osseous abnormality. IMPRESSION: Findings suggest mild pulmonary edema with new layering small to moderate posterior pleural effusions. Aortic atherosclerosis. Electronically Signed   By: Ashley Royalty M.D.   On: 02/22/2017 20:23   Dg Chest Port 1 View  Result Date: 03/02/2017 CLINICAL DATA:  Shortness of breath tachycardia and productive cough. EXAM: PORTABLE CHEST 1 VIEW COMPARISON:  February 22, 2017.  FINDINGS: The heart size and mediastinal contours are stable. The heart size is mildly enlarged. There is minimal increased pulmonary interstitium. There is no focal pneumonia or pleural effusion. The visualized skeletal structures are stable. IMPRESSION: Mild cardiomegaly. Minimal increased pulmonary interstitium unchanged. Electronically Signed   By: Abelardo Diesel M.D.   On: 03/02/2017 15:23    Time Spent in minutes  30   Jani Gravel M.D on 03/06/2017 at 8:20 AM  Between 7am to 7pm - Pager - [phone]  After 7pm go to [email or web address] - password [facility]  [facility] -  Office  [phone]

## 2017-03-06 NOTE — Progress Notes (Signed)
CSW met with patient at bedside. No family present at bedside. Per MD, patient will discharge tomorrow and CSW informed patient of likely discharge. CSW also updated Camille at [facility], where patient will be discharged to SNF. CSW left message for patient's spouse. CSW to continue to follow and support with discharge.   Susan Porter, LCSWA [phone]  

## 2017-03-06 NOTE — Plan of Care (Signed)
Problem: Safety: Goal: Ability to remain free from injury will improve Outcome: Progressing Bed alarm armed. Patient remains free from injury this shift.   Problem: Activity: Goal: Risk for activity intolerance will decrease Outcome: Progressing Patient disoriented when waking up during shift, able to be reoriented.

## 2017-03-07 LAB — COMPREHENSIVE METABOLIC PANEL
ALK PHOS: 52 U/L (ref 38–126)
ALT: 17 U/L (ref 14–54)
AST: 17 U/L (ref 15–41)
Albumin: 2.8 g/dL — ABNORMAL LOW (ref 3.5–5.0)
Anion gap: 9 (ref 5–15)
BILIRUBIN TOTAL: 0.8 mg/dL (ref 0.3–1.2)
BUN: 18 mg/dL (ref 6–20)
CO2: 25 mmol/L (ref 22–32)
CREATININE: 0.67 mg/dL (ref 0.44–1.00)
Calcium: 7.9 mg/dL — ABNORMAL LOW (ref 8.9–10.3)
Chloride: 101 mmol/L (ref 101–111)
GFR calc Af Amer: >60 mL/min (ref >60)
Glucose, Bld: 84 mg/dL (ref 65–99)
POTASSIUM: 3.7 mmol/L (ref 3.5–5.1)
Sodium: 135 mmol/L (ref 135–145)
Total Protein: 5 g/dL — ABNORMAL LOW (ref 6.5–8.1)

## 2017-03-07 LAB — CBC
HEMATOCRIT: 26.8 % — AB (ref 36.0–46.0)
Hemoglobin: 8.1 g/dL — ABNORMAL LOW (ref 12.0–15.0)
MCH: 29.5 pg (ref 26.0–34.0)
MCHC: 30.2 g/dL (ref 30.0–36.0)
MCV: 97.5 fL (ref 78.0–100.0)
Platelets: 338 10*3/uL (ref 150–400)
RBC: 2.75 MIL/uL — ABNORMAL LOW (ref 3.87–5.11)
RDW: 17 % — AB (ref 11.5–15.5)
WBC: 4.4 10*3/uL (ref 4.0–10.5)

## 2017-03-07 MED ORDER — LOSARTAN POTASSIUM 50 MG PO TABS
50.0000 mg | ORAL_TABLET | Freq: Two times a day (BID) | ORAL | Status: DC
  Administered 2017-03-07: 50 mg via ORAL
  Filled 2017-03-07: qty 1

## 2017-03-07 MED ORDER — LEVALBUTEROL HCL 1.25 MG/0.5ML IN NEBU: 1.2500 mg | INHALATION_SOLUTION | Freq: Four times a day (QID) | RESPIRATORY_TRACT | 12 refills | Status: AC | PRN

## 2017-03-07 MED ORDER — DILTIAZEM HCL ER COATED BEADS 360 MG PO CP24: 360.0000 mg | ORAL_CAPSULE | Freq: Every day | ORAL | 0 refills | Status: DC

## 2017-03-07 MED ORDER — FUROSEMIDE 20 MG PO TABS: 20.0000 mg | ORAL_TABLET | Freq: Every day | ORAL | 0 refills | Status: DC

## 2017-03-07 MED ORDER — PRO-STAT SUGAR FREE PO LIQD: 30.0000 mL | Freq: Two times a day (BID) | ORAL | 0 refills | Status: DC

## 2017-03-07 MED ORDER — METOPROLOL TARTRATE 50 MG PO TABS: 50.0000 mg | ORAL_TABLET | Freq: Two times a day (BID) | ORAL | 0 refills | Status: DC

## 2017-03-07 MED ORDER — LOSARTAN POTASSIUM 50 MG PO TABS: 50.0000 mg | ORAL_TABLET | Freq: Two times a day (BID) | ORAL | 0 refills | Status: DC

## 2017-03-07 NOTE — Discharge Summary (Signed)
Lori [name], is a 83 y.o. female  DOB 06/20/1926  MRN [id number].  Admission date:  03/02/2017  Admitting Physician  Lori Kocher, MD  Discharge Date:  03/07/2017   Primary MD  Lori Battles, MD  Recommendations for primary care physician for things to follow:     Atrial fibrillation with RVR, recurrent (CHADS-Vasc score of at least 5; she was not deemed to be a candidate for long-term anticoagulation in the past). Cont cardizem CD 360mg  po qday Cont lopressor 50mg  po bid  Pt is off bystolic Please monitor hr, may need titration of medication   Hypertension uncontrolled Losartan increased to 50mg  po bid Check cbc, cmp, in 3-5 days  Tachypnea resolved.  Breathing well this am.  Continue formulary equivalent for Symbicort for now.  Anxiety, which I believe is contributing to her shortness of breath. This presentation seems very similar to April. Improved after IV ativan in the ED Continue oral klonopin prn on the floor  Syncope Likely related to intermittent arrhythmia Telemetry monitoring Echo deferred; she had one in April  Mild hypokalemia Resolved, hopefully   MDS/MPN Restart anagrilide Cbc in 3-5 days  Chronic diastolic CHF. No overt pulmonary edema on chest xray but she has leg swelling. Continue home dose of lasix Check daily weight and please contact physician if gains >=4 lbs  Admission Diagnosis  Atrial fibrillation with RVR (HCC) [I48.91]   Discharge Diagnosis  Atrial fibrillation with RVR ([place]) [I48.91]    Active Problems:   Essential hypertension   Chronic obstructive pulmonary disease (HCC)   Atrial fibrillation with RVR (HCC)   MDS/MPN (myelodysplastic/myeloproliferative neoplasms) (HCC)   Syncope   Tachypnea   Anxiety   Hypokalemia      Past Medical History:  Diagnosis Date  . ALLERGIC RHINITIS   . Anxiety   . Asthma   . Atrial  fibrillation ([place])   . Back pain of thoracolumbar region    right  . Bronchiectasis   . Colles' fracture of left radius   . COPD (chronic obstructive pulmonary disease) ([place])   . Coronary artery disease   . Diastolic dysfunction   . Diverticulosis   . Dysrhythmia   . History of pneumonia   . Hx of cardiovascular stress test    a. ETT-MV 1/14: Exercised 4:16, no ECG changes, poor ex tol, ant defect c/w soft tissue atten, no ischemia, EF 75%  . Hx of colonic polyp   . Hypertension   . IBS (irritable bowel syndrome)   . Insomnia   . Multiple rib fractures 10/2008   bilateral  . Pleural effusion, left   . Pruritus   . Pulmonary nodule   . Syncope     Past Surgical History:  Procedure Laterality Date  . ANKLE CLOSED REDUCTION Right 12/11/2014   Procedure: CLOSED REDUCTION ANKLE;  Surgeon: Lori Can, MD;  Location: [place];  Service: Orthopedics;  Laterality: Right;  . APPENDECTOMY    . BLADDER SUSPENSION  2005   A-P  . CHOLECYSTECTOMY    .  COLONOSCOPY  04/06/2012   Procedure: COLONOSCOPY;  Surgeon: Lori Bears, MD;  Location: [facility];  Service: Gastroenterology;  Laterality: N/A;  . EXTERNAL FIXATION LEG Right 12/12/2014   Procedure: ADJUSTMENT OF EXTERNAL FIXATION  RIGHT ANKLE;  Surgeon: Lori Can, MD;  Location: [place];  Service: Orthopedics;  Laterality: Right;  . EXTERNAL FIXATION LEG Right 12/11/2014   Procedure: POSSIBLE EXTERNAL FIXATION RIGHT ANKLE;  Surgeon: Lori Can, MD;  Location: [place];  Service: Orthopedics;  Laterality: Right;  . HARDWARE REMOVAL Right 12/25/2014   Procedure: RIGHT ANKLE REMOVE OF DEEP IMPLANT ANE EXTERNAL FIXATOR ;  Surgeon: Lori Simmer, MD;  Location: [place];  Service: Orthopedics;  Laterality: Right;  . ORIF ANKLE FRACTURE Right 12/25/2014   Procedure: OPEN REDUCTION INTERNAL FIXATION (ORIF)TRIMALLEOLAR  ANKLE FRACTURE;  Surgeon: Lori Simmer, MD;  Location: [place];  Service: Orthopedics;   Laterality: Right;  . ORIF WRIST FRACTURE Left 2010     Dr. Burney [name].  Marland Kitchen POLYPECTOMY    . ROTATOR CUFF REPAIR Right 2006  . TONSILLECTOMY    . TONSILLECTOMY    . TOTAL KNEE ARTHROPLASTY Right 08/2010       HPI  from the history and physical done on the day of admission:    83 y.o.woman with a history of HTN, diastolic CHF, CAD, COPD, remote tobacco use, recent diagnosis of MDS/MPN (she is on Anagrelide; followed by Lori [name]) who presents to the ED from [facility] for evaluation of palpitations and shortness of breath, onset earlier today. She denies associated chest pain. No nausea. She has increased edema in her legs. She reports that she has just finished a course of antibiotic for "double pneumonia", so she thought her breathing would be better, not worse. She also reports three syncopal episodes in the past two weeks.  ED Course:The patient was found to be in atrial fibrillation with RVR. Cardiology was asked to see the patient in the ED. Recommendations noted. Hospitalist asked to admit. At the time of my evaluation, she remained tachycardic with heart rates near 150 despite being on max dose cardizem infusion.    Hospital Course:     Pt was admitted and started on cardizem gtt.  Hr improved.  Cardiology thought she was in MAT.  We transitioned her over to cardizem CD  po and titrated up to 360mg  po qday, which helped her hr but since she was still tachycardic added metoprolol and then increased this up to 50mg  po bid.  Her heart rate has been averaging 80.  Her bp was still slightly high and therefore losartan increased to 50mg  po bid.  Pt has been stable and doing well,  She is requesting to go back to [facility] today.     Follow UP   Contact information for follow-up providers    Lori Battles, MD Follow up in 1 week(s).   Specialty:  Internal Medicine Contact information: [address] [place] [address] [phone]            Contact information  for after-discharge care    Destination    [facility] Follow up.   Specialty:  [place] information: [place] [phone]                   Consults obtained - cardiology  Discharge Condition: stable  Diet and Activity recommendation: See Discharge Instructions below  Discharge Instructions  Discharge Medications     Allergies as of 03/07/2017      Reactions   Albuterol Sulfate Palpitations   Levaquin [levofloxacin In D5w] Other (See Comments)   QT prolongation. Should avoid all flouroquinolones.    Doxycycline Diarrhea   Lactose Intolerance (gi) Other (See Comments)   Amoxicillin Other (See Comments)   REACTION: unspecified   Codeine Other (See Comments)   [name] take Hydrocodone   Penicillins Hives, Itching, Rash   Hard lump and rash Has patient had a PCN reaction causing immediate rash, facial/tongue/throat swelling, SOB or lightheadedness with hypotension: Yes Has patient had a PCN reaction causing severe rash involving mucus membranes or skin necrosis: No Has patient had a PCN reaction that required hospitalization unknown Has patient had a PCN reaction occurring within the last 10 years: No If all of the above answers are "NO", then may proceed with Cephalosporin use.   Sulfa Antibiotics Nausea Only   Sulfonamide Derivatives Nausea Only      Medication List    STOP taking these medications   cephALEXin 500 MG capsule Commonly known as:  KEFLEX   nebivolol 2.5 MG tablet Commonly known as:  BYSTOLIC   predniSONE 10 MG (21) Tbpk tablet Commonly known as:  STERAPRED UNI-PAK 21 TAB     TAKE these medications   acetaminophen 500 MG tablet Commonly known as:  TYLENOL Take 1,000 mg by mouth every 6 (six) hours as needed for headache (pain). Not to exceed 3000mg  daily   anagrelide 1 MG capsule Commonly known as:  AGRYLIN Take 2 tab in the  morning, and 1 tab in everning   aspirin EC 81 MG tablet Take 1 tablet (81 mg total) by mouth daily.   bimatoprost 0.01 % Soln Commonly known as:  LUMIGAN Place 1 drop into both eyes at bedtime.   budesonide-formoterol 160-4.5 MCG/ACT inhaler Commonly known as:  SYMBICORT Inhale 2 puffs into the lungs 2 (two) times daily.   clonazePAM 0.5 MG tablet Commonly known as:  KLONOPIN Take 0.5 tablets (0.25 mg total) by mouth 3 (three) times daily as needed (anxiety).   dicyclomine 10 MG capsule Commonly known as:  BENTYL Take 1 capsule (10 mg total) by mouth 3 (three) times daily before meals. IBS   diltiazem 360 MG 24 hr capsule Commonly known as:  CARDIZEM CD Take 1 capsule (360 mg total) by mouth daily. What changed:  medication strength  how much to take   feeding supplement (PRO-STAT SUGAR FREE 64) Liqd Take 30 mLs by mouth 2 (two) times daily.   furosemide 20 MG tablet Commonly known as:  LASIX Take 1 tablet (20 mg total) by mouth daily.   levalbuterol 1.25 MG/0.5ML nebulizer solution Commonly known as:  XOPENEX Take 1.25 mg by nebulization every 6 (six) hours as needed for wheezing or shortness of breath.   loperamide 2 MG capsule Commonly known as:  IMODIUM Take 2 mg by mouth as needed for diarrhea or loose stools.   losartan 50 MG tablet Commonly known as:  COZAAR Take 1 tablet (50 mg total) by mouth 2 (two) times daily.   magnesium oxide 400 MG tablet Commonly known as:  MAG-OX Take 400 mg by mouth daily as needed (leg cramps).   metoprolol tartrate 50 MG tablet Commonly known as:  LOPRESSOR Take 1 tablet (50 mg total) by mouth 2 (two) times daily.   multivitamin with minerals Tabs tablet Take 1 tablet by mouth daily.   polyvinyl alcohol 1.4 % ophthalmic solution Commonly  known as:  LIQUIFILM TEARS Place 1 drop into both eyes 3 (three) times daily.   zolpidem 5 MG tablet Commonly known as:  AMBIEN Take 1 tablet (5 mg total) by mouth at bedtime as  needed for sleep.       Major procedures and Radiology Reports - PLEASE review detailed and final reports for all details, in brief -      Dg Chest 2 View  Result Date: 02/22/2017 CLINICAL DATA:  Dyspnea since 6 p.m. with chest pain. EXAM: CHEST  2 VIEW COMPARISON:  01/04/2017 CT and CXR FINDINGS: The patient is slightly tilted to the right and rotated on current exam. Heart is borderline enlarged. There is mild aortic atherosclerosis without aneurysm. Hazy opacities at the lung bases are in keeping with new layering small to moderate pleural effusions bilaterally. Hazy appearance of the lungs may also be secondary mild pulmonary edema. High-riding right humeral head. No acute osseous abnormality. IMPRESSION: Findings suggest mild pulmonary edema with new layering small to moderate posterior pleural effusions. Aortic atherosclerosis. Electronically Signed   By: Ashley Royalty M.D.   On: 02/22/2017 20:23   Dg Chest Port 1 View  Result Date: 03/02/2017 CLINICAL DATA:  Shortness of breath tachycardia and productive cough. EXAM: PORTABLE CHEST 1 VIEW COMPARISON:  February 22, 2017. FINDINGS: The heart size and mediastinal contours are stable. The heart size is mildly enlarged. There is minimal increased pulmonary interstitium. There is no focal pneumonia or pleural effusion. The visualized skeletal structures are stable. IMPRESSION: Mild cardiomegaly. Minimal increased pulmonary interstitium unchanged. Electronically Signed   By: Abelardo Diesel M.D.   On: 03/02/2017 15:23    Micro Results     Recent Results (from the past 240 hour(s))  TECHNOLOGIST REVIEW     Status: None   Collection Time: 02/27/17 12:37 PM  Result Value Ref Range Status   Technologist Review   Final    Few Metas and Myelocytes present, 2% nRBCs, dysplastic neutrophils, large and giant platelets   MRSA PCR Screening     Status: None   Collection Time: 03/02/17  8:54 PM  Result Value Ref Range Status   MRSA by PCR NEGATIVE NEGATIVE  Final    Comment:        The GeneXpert MRSA Assay (FDA approved for NASAL specimens only), is one component of a comprehensive MRSA colonization surveillance program. It is not intended to diagnose MRSA infection nor to guide or monitor treatment for MRSA infections.        Today   Subjective    [employer] today has been afebrile.  Pt denies cp, palp, sob, lower ext edema.  no headache,no chest abdominal pain,no new weakness tingling or numbness, feels much better wants to go to SNF today   Objective   Blood pressure (!) 160/69, pulse 70, temperature 98.7 F (37.1 C), temperature source Oral, resp. rate (!) 25, height 5\' 5"  (1.651 m), weight 58.7 kg (129 lb 8 oz), SpO2 100 %. sbp 115-160 over past 24 hours  Intake/Output Summary (Last 24 hours) at 03/07/17 0818 Last data filed at 03/06/17 2015  Gross per 24 hour  Intake              870 ml  Output              850 ml  Net               20 ml    Exam Awake Alert, Oriented x 3, No new  F.N deficits, Normal affect [place].AT,PERRAL Supple Neck,No JVD, No cervical lymphadenopathy appriciated.  Symmetrical Chest wall movement, Good air movement bilaterally, CTAB Irr, Irr, No Gallops,Rubs or new Murmurs, No Parasternal Heave +ve B.Sounds, Abd Soft, Non tender, No organomegaly appriciated, No rebound -guarding or rigidity. No Cyanosis, Clubbing or edema, No new Rash or bruise   Data Review   CBC w Diff: Lab Results  Component Value Date   WBC 4.4 03/07/2017   HGB 8.1 (L) 03/07/2017   HGB 9.7 (L) 02/27/2017   HCT 26.8 (L) 03/07/2017   HCT 30.5 (L) 02/27/2017   PLT 338 03/07/2017   PLT 348 02/27/2017   PLT 571 (H) 01/09/2017   LYMPHOPCT 31 03/02/2017   LYMPHOPCT 35.5 02/27/2017   MONOPCT 3 03/02/2017   MONOPCT 1.3 02/27/2017   EOSPCT 0 03/02/2017   EOSPCT 0.0 02/27/2017   BASOPCT 1 03/02/2017   BASOPCT 0.2 02/27/2017    CMP: Lab Results  Component Value Date   NA 135 03/07/2017   NA 141 01/16/2017   K 3.7  03/07/2017   K 3.6 01/16/2017   CL 101 03/07/2017   CO2 25 03/07/2017   CO2 22 01/16/2017   BUN 18 03/07/2017   BUN 18.5 01/16/2017   CREATININE 0.67 03/07/2017   CREATININE 0.8 01/16/2017   GLU 94 02/13/2015   PROT 5.0 (L) 03/07/2017   PROT 6.4 01/16/2017   ALBUMIN 2.8 (L) 03/07/2017   ALBUMIN 3.7 01/16/2017   BILITOT 0.8 03/07/2017   BILITOT 0.68 01/16/2017   ALKPHOS 52 03/07/2017   ALKPHOS 74 01/16/2017   AST 17 03/07/2017   AST 15 01/16/2017   ALT 17 03/07/2017   ALT 17 01/16/2017  .   Total Time in preparing paper work, data evaluation and todays exam - 2 minutes  Jani Gravel M.D on 03/07/2017 at 8:18 AM  [facility]   Office  [phone]

## 2017-03-07 NOTE — Progress Notes (Signed)
Patient will discharge to [facility] Anticipated discharge date: 03/07/17 Family notified: Nelly Rout, spouse Transportation by: Corey Harold   CSW signing off.  Estanislado Emms, [place]  Clinical Social Worker

## 2017-03-07 NOTE — Care Management Note (Addendum)
Case Management Note  Patient Details  Name: Lori [name] MRN: [id number] Date of Birth: 1926-05-30  Subjective/Objective:  Pt presented with SOB and found to be in Atrial Fib. Initiated on IV Cardizem gtt and changed to po Cardizem. Plan will be to d/c to [facility] today.                   Action/Plan: CSW assisting with disposition needs. No further needs from CM at this time.   Expected Discharge Date:  03/07/17               Expected Discharge Plan:  [place]  In-House Referral:  Clinical Social Work  Discharge planning Services  CM Consult  Post Acute Care Choice:  NA Choice offered to:  NA  DME Arranged:  N/A DME Agency:  NA  HH Arranged:  NA HH Agency:  NA  Status of Service:  Completed, signed off  If discussed at [employer] of Stay Meetings, dates discussed:  03-07-17  Additional Comments:  Bethena Roys, RN 03/07/2017, 9:55 AM

## 2017-03-07 NOTE — Progress Notes (Signed)
Report given to RN at [facility]. Dgtr [name] notified of pt's walker is still being here on the unit & needs to be picked up. Hoover Brunette, RN Pt's purse & suitcase were taken with her.

## 2017-03-07 NOTE — Evaluation (Signed)
Occupational Therapy Evaluation Patient Details Name: Lori [name] MRN: [id number] DOB: 1926-02-03 Today's Date: 03/07/2017    History of Present Illness Patient is a 83 y/o female who presents due to SOB and palpitations. Found to be in A-fib with RVR. PMH includes COPD, diastolic CHF, recent diagnosis of MDS/MPN, A-fib, asthma, back pain, HTN, anxiety.    Clinical Impression   Pt reports having assistance with ADL and using a walker since her recent pneumonia. Pt presents with generalized weakness, decreased activity tolerance and impaired standing balance. She requires set up to moderate assistance with ADL. Pt with plans to d/c to SNF for ST rehab later today. Will defer further OT to SNF.   Follow Up Recommendations  SNF;Supervision/Assistance - 24 hour    Equipment Recommendations       Recommendations for Other Services       Precautions / Restrictions Precautions Precautions: Fall Precaution Comments: watch HR Restrictions Weight Bearing Restrictions: No      Mobility Bed Mobility        General bed mobility comments: received in chair  Transfers Overall transfer level: Needs assistance Equipment used: Rolling walker (2 wheeled) Transfers: Sit to/from Stand Sit to Stand: Min assist         General transfer comment: pt with LOB backward, assist to recover    Balance Overall balance assessment: Needs assistance Sitting-balance support: Feet supported;No upper extremity supported Sitting balance-Leahy Scale: Good     Standing balance support: During functional activity Standing balance-Leahy Scale: Poor Standing balance comment: LOB upon standing from chair                           ADL either performed or assessed with clinical judgement   ADL Overall ADL's : Needs assistance/impaired Eating/Feeding: Independent;Sitting   Grooming: Sitting;Set up   Upper Body Bathing: Minimal assistance;Sitting   Lower Body Bathing: Moderate  assistance;Sit to/from stand   Upper Body Dressing : Minimal assistance;Sitting   Lower Body Dressing: Moderate assistance;Sit to/from stand   Toilet Transfer: Minimal assistance;Stand-pivot;RW   Toileting- Clothing Manipulation and Hygiene: Minimal assistance;Sit to/from stand               Vision Baseline Vision/History: Wears glasses Wears Glasses: Reading only Patient Visual Report: No change from baseline       Perception     Praxis      Pertinent Vitals/Pain Pain Assessment: No/denies pain     Hand Dominance Right   Extremity/Trunk Assessment Upper Extremity Assessment Upper Extremity Assessment: Overall WFL for tasks assessed   Lower Extremity Assessment Lower Extremity Assessment: Defer to PT evaluation   Cervical / Trunk Assessment Cervical / Trunk Assessment: Kyphotic   Communication Communication Communication: HOH   Cognition Arousal/Alertness: Awake/alert Behavior During Therapy: WFL for tasks assessed/performed Overall Cognitive Status: Within Functional Limits for tasks assessed                                 General Comments: very [facility]   General Comments       Exercises     Shoulder Instructions      Home Living Family/patient expects to be discharged to:: Skilled nursing facility Living Arrangements: Spouse/significant other Available Help at Discharge: Family;Available 24 hours/day (husband cannot physically assist) Type of Home: House ([facility]) Home Access: Level entry     Home Layout: One level  Bathroom Shower/Tub: Occupational psychologist: Handicapped height     Home Equipment: Environmental consultant - 2 wheels;Cane - single point          Prior Functioning/Environment Level of Independence: Needs assistance  Gait / Transfers Assistance Needed: ambulating with walker ADL's / Homemaking Assistance Needed: was assisted for bathing and dressing and housekeeping            OT Problem List:  Decreased strength;Impaired balance (sitting and/or standing)      OT Treatment/Interventions:      OT Goals(Current goals can be found in the care plan section) Acute Rehab OT Goals Patient Stated Goal: [place] with [place]  OT Frequency:     Barriers to D/C:            Co-evaluation              AM-PAC PT "6 Clicks" Daily Activity     Outcome Measure Help from another person eating meals?: None Help from another person taking care of personal grooming?: A Little Help from another person toileting, which includes using toliet, bedpan, or urinal?: A Little Help from another person bathing (including washing, rinsing, drying)?: A Lot Help from another person to put on and taking off regular upper body clothing?: A Little Help from another person to put on and taking off regular lower body clothing?: A Lot 6 Click Score: 17   End of Session Equipment Utilized During Treatment: Rolling walker;Gait belt  Activity Tolerance: Patient tolerated treatment well Patient left: in chair;with call bell/phone within reach;with chair alarm set  OT Visit Diagnosis: Unsteadiness on feet (R26.81);Muscle weakness (generalized) (M62.81)                Time: 4917-9150 OT Time Calculation (min): 23 min Charges:  OT General Charges $OT Visit: 1 Procedure OT Evaluation $OT Eval Moderate Complexity: 1 Procedure OT Treatments $Self Care/Home Management : 8-22 mins G-Codes:     Malka So 03/07/2017, 9:54 AM  [phone]

## 2017-03-07 NOTE — Progress Notes (Signed)
Physical Therapy Treatment Patient Details Name: Lori [name] MRN: [id number] DOB: 1926-08-06 Today's Date: 03/07/2017    History of Present Illness Patient is a 83 y/o female who presents due to SOB and palpitations. Found to be in A-fib with RVR. PMH includes COPD, diastolic CHF, recent diagnosis of MDS/MPN, A-fib, asthma, back pain, HTN, anxiety.     PT Comments    Patient progressing well towards PT goals. Seems in better spirits today as she is supposed to d/c to rehab. Tolerated gait training with min guard assist for safety. HR stable today in 80s-90s throughout mobility. Upset she has not slept. Continues to demonstrate imbalance and decreased endurance. Will continue to follow if still in hospital.  Follow Up Recommendations  SNF;Supervision for mobility/OOB;Supervision/Assistance - 24 hour     Equipment Recommendations  None recommended by PT    Recommendations for Other Services       Precautions / Restrictions Precautions Precautions: Fall Precaution Comments: watch HR Restrictions Weight Bearing Restrictions: No    Mobility  Bed Mobility Overal bed mobility: Needs Assistance Bed Mobility: Rolling;Sidelying to Sit Rolling: Min guard Sidelying to sit: Min guard;HOB elevated          Transfers Overall transfer level: Needs assistance Equipment used: Rolling walker (2 wheeled) Transfers: Sit to/from Stand Sit to Stand: Min guard         General transfer comment: Min guard for safety. Stood from [employer]. SPT bed to/from [facility]. Transferred to chair post ambulation.  Ambulation/Gait Ambulation/Gait assistance: Min guard Ambulation Distance (Feet): 125 Feet Assistive device: Rolling walker (2 wheeled) Gait Pattern/deviations: Step-through pattern;Decreased stride length Gait velocity: decreased   General Gait Details: Slow, mildly unsteady gait. HR stable in 80s-90s bpm.    Stairs            Wheelchair Mobility    Modified Rankin (Stroke  Patients Only)       Balance Overall balance assessment: Needs assistance Sitting-balance support: Feet supported;No upper extremity supported Sitting balance-Leahy Scale: Good     Standing balance support: During functional activity Standing balance-Leahy Scale: Fair Standing balance comment: Able to pull up underwear without assist or LOB.                             Cognition Arousal/Alertness: Awake/alert Behavior During Therapy: WFL for tasks assessed/performed Overall Cognitive Status: Within Functional Limits for tasks assessed                                 General Comments: very HOH      Exercises      General Comments        Pertinent Vitals/Pain Pain Assessment: No/denies pain    Home Living                      Prior Function            PT Goals (current goals can now be found in the care plan section) Progress towards PT goals: Progressing toward goals    Frequency    Min 2X/week      PT Plan Current plan remains appropriate    Co-evaluation              AM-PAC PT "6 Clicks" Daily Activity  Outcome Measure  Difficulty turning over in bed (including adjusting bedclothes, sheets and blankets)?: None Difficulty moving  from lying on back to sitting on the side of the bed? : None Difficulty sitting down on and standing up from a chair with arms (e.g., wheelchair, bedside commode, etc,.)?: None Help needed moving to and from a bed to chair (including a wheelchair)?: A Little Help needed walking in hospital room?: A Little Help needed climbing 3-5 steps with a railing? : [employer] 6 Click Score: 20    End of Session Equipment Utilized During Treatment: Gait belt Activity Tolerance: Patient tolerated treatment well Patient left: in chair;with call bell/phone within reach;with chair alarm set Nurse Communication: Mobility status PT Visit Diagnosis: Muscle weakness (generalized) (M62.81);Unsteadiness on  feet (R26.81)     Time: 7654-6503 PT Time Calculation (min) (ACUTE ONLY): 20 min  Charges:  $Gait Training: 8-22 mins                    G Codes:       Wray Kearns, PT, DPT [phone]     Marguarite Arbour A [place] 03/07/2017, 9:41 AM
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
## 2017-03-07 NOTE — Clinical Social Work Placement (Signed)
   CLINICAL SOCIAL WORK PLACEMENT  NOTE  Date:  03/07/2017  Patient Details  Name: Lori [name] MRN: [id number] Date of Birth: 1926-07-21  Clinical Social Work is seeking post-discharge placement for this patient at the [place] level of care (*CSW will initial, date and re-position this form in  chart as items are completed):  Yes   Patient/family provided with [facility] Work Department's list of facilities offering this level of care within the geographic area requested by the patient (or if unable, by the patient's family).  Yes   Patient/family informed of their freedom to choose among providers that offer the needed level of care, that participate in Medicare, Medicaid or managed care program needed by the patient, have an available bed and are willing to accept the patient.  Yes   Patient/family informed of [facility]'s ownership interest in [facility] and [facility], as well as of the fact that they are under no obligation to receive care at these facilities.  PASRR submitted to EDS on       PASRR number received on       Existing PASRR number confirmed on 03/07/17     FL2 transmitted to all facilities in geographic area requested by pt/family on       FL2 transmitted to all facilities within larger geographic area on       Patient informed that his/her managed care company has contracts with or will negotiate with certain facilities, including the following:  [facility]     Yes   Patient/family informed of bed offers received.  Patient chooses bed at [facility]     Physician recommends and patient chooses bed at [facility]    Patient to be transferred to [facility] on 03/07/17.  Patient to be transferred to facility by [facility]     Patient family notified on 03/07/17 of transfer.  Name of family member notified:  Lori [name], spouse     PHYSICIAN       Additional Comment:     _______________________________________________ Estanislado Emms, LCSW 03/07/2017, 10:21 AM

## 2017-03-08 DIAGNOSIS — I10 Essential (primary) hypertension: Secondary | ICD-10-CM | POA: Diagnosis not present

## 2017-03-08 DIAGNOSIS — F419 Anxiety disorder, unspecified: Secondary | ICD-10-CM | POA: Diagnosis not present

## 2017-03-08 DIAGNOSIS — I4891 Unspecified atrial fibrillation: Secondary | ICD-10-CM | POA: Diagnosis not present

## 2017-03-08 DIAGNOSIS — R0682 Tachypnea, not elsewhere classified: Secondary | ICD-10-CM | POA: Diagnosis not present

## 2017-03-08 DIAGNOSIS — J449 Chronic obstructive pulmonary disease, unspecified: Secondary | ICD-10-CM | POA: Diagnosis not present

## 2017-03-14 ENCOUNTER — Ambulatory Visit (HOSPITAL_BASED_OUTPATIENT_CLINIC_OR_DEPARTMENT_OTHER): Payer: Medicare Other

## 2017-03-14 ENCOUNTER — Other Ambulatory Visit (HOSPITAL_BASED_OUTPATIENT_CLINIC_OR_DEPARTMENT_OTHER): Payer: Medicare Other

## 2017-03-14 ENCOUNTER — Ambulatory Visit: Payer: Medicare Other | Admitting: Hematology

## 2017-03-14 DIAGNOSIS — D61818 Other pancytopenia: Secondary | ICD-10-CM

## 2017-03-14 DIAGNOSIS — D469 Myelodysplastic syndrome, unspecified: Secondary | ICD-10-CM

## 2017-03-14 DIAGNOSIS — D519 Vitamin B12 deficiency anemia, unspecified: Secondary | ICD-10-CM | POA: Diagnosis not present

## 2017-03-14 LAB — COMPREHENSIVE METABOLIC PANEL
ALBUMIN: 3.2 g/dL — AB (ref 3.5–5.0)
ALT: 15 U/L (ref 0–55)
AST: 12 U/L (ref 5–34)
Alkaline Phosphatase: 67 U/L (ref 40–150)
Anion Gap: 8 mEq/L (ref 3–11)
BUN: 19.6 mg/dL (ref 7.0–26.0)
CALCIUM: 9.2 mg/dL (ref 8.4–10.4)
CO2: 23 mEq/L (ref 22–29)
Chloride: 109 mEq/L (ref 98–109)
Creatinine: 0.8 mg/dL (ref 0.6–1.1)
EGFR: 64 mL/min/{1.73_m2} — AB (ref >90)
Glucose: 126 mg/dl (ref 70–140)
POTASSIUM: 4 meq/L (ref 3.5–5.1)
Sodium: 140 mEq/L (ref 136–145)
Total Bilirubin: 0.47 mg/dL (ref 0.20–1.20)
Total Protein: 6.2 g/dL — ABNORMAL LOW (ref 6.4–8.3)

## 2017-03-14 LAB — CBC & DIFF AND RETIC
BASO%: 0 % (ref 0.0–2.0)
BASOS ABS: 0 10*3/uL (ref 0.0–0.1)
EOS%: 0.3 % (ref 0.0–7.0)
Eosinophils Absolute: 0 10*3/uL (ref 0.0–0.5)
HEMATOCRIT: 28.6 % — AB (ref 34.8–46.6)
HEMOGLOBIN: 8.9 g/dL — AB (ref 11.6–15.9)
Immature Retic Fract: 3 % (ref 1.60–10.00)
LYMPH%: 28.9 % (ref 14.0–49.7)
MCH: 30.1 pg (ref 25.1–34.0)
MCHC: 31.1 g/dL — AB (ref 31.5–36.0)
MCV: 96.6 fL (ref 79.5–101.0)
MONO#: 0.1 10*3/uL (ref 0.1–0.9)
MONO%: 2.6 % (ref 0.0–14.0)
NEUT#: 2.1 10*3/uL (ref 1.5–6.5)
NEUT%: 68.2 % (ref 38.4–76.8)
Platelets: 352 10*3/uL (ref 145–400)
RBC: 2.96 10*6/uL — ABNORMAL LOW (ref 3.70–5.45)
RDW: 16.5 % — AB (ref 11.2–14.5)
Retic %: 1.03 % (ref 0.70–2.10)
Retic Ct Abs: 30.49 10*3/uL — ABNORMAL LOW (ref 33.70–90.70)
WBC: 3.1 10*3/uL — ABNORMAL LOW (ref 3.9–10.3)
lymph#: 0.9 10*3/uL (ref 0.9–3.3)
nRBC: 0 % (ref 0–0)

## 2017-03-14 MED ORDER — DARBEPOETIN ALFA 150 MCG/0.3ML IJ SOSY
150.0000 ug | PREFILLED_SYRINGE | Freq: Once | INTRAMUSCULAR | Status: AC
  Administered 2017-03-14: 150 ug via SUBCUTANEOUS
  Filled 2017-03-14: qty 0.3

## 2017-03-14 MED ORDER — CYANOCOBALAMIN 1000 MCG/ML IJ SOLN
1000.0000 ug | Freq: Once | INTRAMUSCULAR | Status: AC
  Administered 2017-03-14: 1000 ug via INTRAMUSCULAR

## 2017-03-14 NOTE — Patient Instructions (Signed)
Darbepoetin Alfa injection What is this medicine? DARBEPOETIN ALFA (dar be POE e tin AL fa) helps your body make more red blood cells. It is used to treat anemia caused by chronic kidney failure and chemotherapy. This medicine may be used for other purposes; ask your health care provider or pharmacist if you have questions. COMMON BRAND NAME(S): Aranesp What should I tell my health care provider before I take this medicine? They need to know if you have any of these conditions: -blood clotting disorders or history of blood clots -cancer patient not on chemotherapy -cystic fibrosis -heart disease, such as angina, heart failure, or a history of a heart attack -hemoglobin level of 12 g/dL or greater -high blood pressure -low levels of folate, iron, or vitamin B12 -seizures -an unusual or allergic reaction to darbepoetin, erythropoietin, albumin, hamster proteins, latex, other medicines, foods, dyes, or preservatives -pregnant or trying to get pregnant -breast-feeding How should I use this medicine? This medicine is for injection into a vein or under the skin. It is usually given by a health care professional in a hospital or clinic setting. If you get this medicine at home, you will be taught how to prepare and give this medicine. Use exactly as directed. Take your medicine at regular intervals. Do not take your medicine more often than directed. It is important that you put your used needles and syringes in a special sharps container. Do not put them in a trash can. If you do not have a sharps container, call your pharmacist or healthcare provider to get one. A special MedGuide will be given to you by the pharmacist with each prescription and refill. Be sure to read this information carefully each time. Talk to your pediatrician regarding the use of this medicine in children. While this medicine may be used in children as young as 1 year for selected conditions, precautions do  apply. Overdosage: If you think you have taken too much of this medicine contact a poison control center or emergency room at once. NOTE: This medicine is only for you. Do not share this medicine with others. What if I miss a dose? If you miss a dose, take it as soon as you can. If it is almost time for your next dose, take only that dose. Do not take double or extra doses. What may interact with this medicine? Do not take this medicine with any of the following medications: -epoetin alfa This list may not describe all possible interactions. Give your health care provider a list of all the medicines, herbs, non-prescription drugs, or dietary supplements you use. Also tell them if you smoke, drink alcohol, or use illegal drugs. Some items may interact with your medicine. What should I watch for while using this medicine? Your condition will be monitored carefully while you are receiving this medicine. You may need blood work done while you are taking this medicine. What side effects may I notice from receiving this medicine? Side effects that you should report to your doctor or health care professional as soon as possible: -allergic reactions like skin rash, itching or hives, swelling of the face, lips, or tongue -breathing problems -changes in vision -chest pain -confusion, trouble speaking or understanding -feeling faint or lightheaded, falls -high blood pressure -muscle aches or pains -pain, swelling, warmth in the leg -rapid weight gain -severe headaches -sudden numbness or weakness of the face, arm or leg -trouble walking, dizziness, loss of balance or coordination -seizures (convulsions) -swelling of the ankles, feet, hands -  unusually weak or tired Side effects that usually do not require medical attention (report to your doctor or health care professional if they continue or are bothersome): -diarrhea -fever, chills (flu-like symptoms) -headaches -nausea, vomiting -redness,  stinging, or swelling at site where injected This list may not describe all possible side effects. Call your doctor for medical advice about side effects. You may report side effects to FDA at 1-800-FDA-1088. Where should I keep my medicine? Keep out of the reach of children. Store in a refrigerator between 2 and 8 degrees C (36 and 46 degrees F). Do not freeze. Do not shake. Throw away any unused portion if using a single-dose vial. Throw away any unused medicine after the expiration date. NOTE: This sheet is a summary. It may not cover all possible information. If you have questions about this medicine, talk to your doctor, pharmacist, or health care provider.  2018 [employer] (2016-04-25 19:52:26) Cyanocobalamin, Vitamin B12 injection What is this medicine? CYANOCOBALAMIN (sye an oh koe BAL a min) is a man made form of vitamin B12. Vitamin B12 is used in the growth of healthy blood cells, nerve cells, and proteins in the body. It also helps with the metabolism of fats and carbohydrates. This medicine is used to treat people who can not absorb vitamin B12. This medicine may be used for other purposes; ask your health care provider or pharmacist if you have questions. COMMON BRAND NAME(S): B-12 Compliance Kit, B-12 Injection Kit, Cyomin, LA-12, Nutri-Twelve, Physicians EZ Use B-12, Primabalt What should I tell my health care provider before I take this medicine? They need to know if you have any of these conditions: -kidney disease -Leber's disease -megaloblastic anemia -an unusual or allergic reaction to cyanocobalamin, cobalt, other medicines, foods, dyes, or preservatives -pregnant or trying to get pregnant -breast-feeding How should I use this medicine? This medicine is injected into a muscle or deeply under the skin. It is usually given by a health care professional in a clinic or doctor's office. However, your doctor may teach you how to inject yourself. Follow all  instructions. Talk to your pediatrician regarding the use of this medicine in children. Special care may be needed. Overdosage: If you think you have taken too much of this medicine contact a poison control center or emergency room at once. NOTE: This medicine is only for you. Do not share this medicine with others. What if I miss a dose? If you are given your dose at a clinic or doctor's office, call to reschedule your appointment. If you give your own injections and you miss a dose, take it as soon as you can. If it is almost time for your next dose, take only that dose. Do not take double or extra doses. What may interact with this medicine? -colchicine -heavy alcohol intake This list may not describe all possible interactions. Give your health care provider a list of all the medicines, herbs, non-prescription drugs, or dietary supplements you use. Also tell them if you smoke, drink alcohol, or use illegal drugs. Some items may interact with your medicine. What should I watch for while using this medicine? Visit your doctor or health care professional regularly. You may need blood work done while you are taking this medicine. You may need to follow a special diet. Talk to your doctor. Limit your alcohol intake and avoid smoking to get the best benefit. What side effects may I notice from receiving this medicine? Side effects that you should report to your doctor   or health care professional as soon as possible: -allergic reactions like skin rash, itching or hives, swelling of the face, lips, or tongue -blue tint to skin -chest tightness, pain -difficulty breathing, wheezing -dizziness -red, swollen painful area on the leg Side effects that usually do not require medical attention (report to your doctor or health care professional if they continue or are bothersome): -diarrhea -headache This list may not describe all possible side effects. Call your doctor for medical advice about side  effects. You may report side effects to FDA at 1-800-FDA-1088. Where should I keep my medicine? Keep out of the reach of children. Store at room temperature between 15 and 30 degrees C (59 and 85 degrees F). Protect from light. Throw away any unused medicine after the expiration date. NOTE: This sheet is a summary. It may not cover all possible information. If you have questions about this medicine, talk to your doctor, pharmacist, or health care provider.  2018 [employer] Standard (2007-12-17 22:10:20)  

## 2017-03-20 DIAGNOSIS — I5032 Chronic diastolic (congestive) heart failure: Secondary | ICD-10-CM | POA: Diagnosis not present

## 2017-03-20 DIAGNOSIS — Z7189 Other specified counseling: Secondary | ICD-10-CM | POA: Diagnosis not present

## 2017-03-20 DIAGNOSIS — J449 Chronic obstructive pulmonary disease, unspecified: Secondary | ICD-10-CM | POA: Diagnosis not present

## 2017-03-20 DIAGNOSIS — I4891 Unspecified atrial fibrillation: Secondary | ICD-10-CM | POA: Diagnosis not present

## 2017-03-28 NOTE — Progress Notes (Signed)
[place]  [phone] [phone]) [phone]  Clinic Follow up Note   Patient Care Team: Leanna Battles, MD as PCP - General (Internal Medicine) Brand Males, MD as Consulting Physician (Pulmonary Disease) [name], MD as Consulting Physician (Orthopedic Surgery) 03/30/2017  CHIEF COMPLAIN: follow up MDS/MPN   HISTORY OF PRESENT ILLNESS (From Dr. Laverle Patter note on 10/11/2016): Lori [name] 83 y.o. female is here for evaluation of leukopenia. Patient states that she's been having severe fatigue since November 2017. She was having trouble with her atrial fibrillation again, however this has improved. CBC from 12/617 demonstrated WBC 4.83K, hemoglobin 10.9 g/dL, hematocrit 31.8%, MCV 105.4, place a count 764K. CBC from 04/11/16 demonstrated WBC 4.1K, hemoglobin 12.5 g/dL, hematocrit 38.1%, platelet count 693K. Most recent CBC from 09/28/16 demonstrated worsening of her leukopenia and anemia with WBC 2K, hemoglobin 9.6 g/dL, hematocrit 28.5%, MCV 107, platelet count 748K. She also has diarrhea about 3-4 times a day, however she states that this is been a lifelong problem for her. She has lost about 5 pounds in the past 2 months. She also has occasional shortness of breath. She denies any recent changes in her medications.  CURRENT THERAPY:  1. Aranesp 149mg every 2 weeks, started on 11/18/2016, increased to 3032m every 2 weeks on 03/30/2017 2. Anagrelide 0.'5mg'$  BID started on 11/11/2016, increased to 1.'0mg'$  in am and 0.'5mg'$  in pm on 12/06/2016, and '1mg'$  bid on 01/16/2017, '2mg'$  in the morning and 1 mg in the evening starting 01/30/17   INTERVAL HISTORY: She returns for follow up accompanied by her daughter. She reports not feeling well today. She had pain, tightness and burning across her chest around 7 am which she thought was an heart attack. It lasted around 30 minutes. She also experiences fatigue. She has some stomach pain and has some constipation some days. She was in  the hospital 6/14 for double pneumonia.  She reports having acute anxiety. She would like some anxiety medicine to help.   REVIEW OF SYSTEMS:   Constitutional: Denies fevers, chills or abnormal weight loss  Eyes: Denies blurriness of vision Ears, nose, mouth, throat, and face: Denies mucositis or sore throat (+) hearing loss Respiratory: Denies cough, dyspnea or wheezes (+) SOB Cardiovascular: Denies chest discomfort or lower extremity swelling (+) chest tightness (+) tachycardia (+) atrial fibrilation Gastrointestinal:  Denies nausea, heartburn or change in bowel habits (+) nausea (+)constipation Skin: Denies abnormal skin rashes Lymphatics: Denies new lymphadenopathy  Neurological:Denies numbness, tingling or new weaknesses  Behavioral/Psych: Mood is stable, no new changes (+) anxiety  All other systems were reviewed with the patient and are negative.  MEDICAL HISTORY:  Past Medical History:  Diagnosis Date  . ALLERGIC RHINITIS   . Anxiety   . Asthma   . Atrial fibrillation ([facility]  . Back pain of thoracolumbar region    right  . Bronchiectasis   . Colles' fracture of left radius   . COPD (chronic obstructive pulmonary disease) ([facility]  . Coronary artery disease   . Diastolic dysfunction   . Diverticulosis   . Dysrhythmia   . History of pneumonia   . Hx of cardiovascular stress test    a. ETT-MV 1/14: Exercised 4:16, no ECG changes, poor ex tol, ant defect c/w soft tissue atten, no ischemia, EF 75%  . Hx of colonic polyp   . Hypertension   . IBS (irritable bowel syndrome)   . Insomnia   . Multiple rib fractures 10/2008   bilateral  .  Pleural effusion, left   . Pruritus   . Pulmonary nodule   . Syncope     SURGICAL HISTORY: Past Surgical History:  Procedure Laterality Date  . ANKLE CLOSED REDUCTION Right 12/11/2014   Procedure: CLOSED REDUCTION ANKLE;  Surgeon: Rod Can, MD;  Location: [facility];  Service: Orthopedics;  Laterality: Right;  . APPENDECTOMY    .  BLADDER SUSPENSION  2005   A-P  . CHOLECYSTECTOMY    . COLONOSCOPY  04/06/2012   Procedure: COLONOSCOPY;  Surgeon: Jerene Bears, MD;  Location: [facility];  Service: Gastroenterology;  Laterality: N/A;  . EXTERNAL FIXATION LEG Right 12/12/2014   Procedure: ADJUSTMENT OF EXTERNAL FIXATION  RIGHT ANKLE;  Surgeon: Rod Can, MD;  Location: [place];  Service: Orthopedics;  Laterality: Right;  . EXTERNAL FIXATION LEG Right 12/11/2014   Procedure: POSSIBLE EXTERNAL FIXATION RIGHT ANKLE;  Surgeon: Rod Can, MD;  Location: [place];  Service: Orthopedics;  Laterality: Right;  . HARDWARE REMOVAL Right 12/25/2014   Procedure: RIGHT ANKLE REMOVE OF DEEP IMPLANT ANE EXTERNAL FIXATOR ;  Surgeon: [name], MD;  Location: [place];  Service: Orthopedics;  Laterality: Right;  . ORIF ANKLE FRACTURE Right 12/25/2014   Procedure: OPEN REDUCTION INTERNAL FIXATION (ORIF)TRIMALLEOLAR  ANKLE FRACTURE;  Surgeon: [name], MD;  Location: [place];  Service: Orthopedics;  Laterality: Right;  . ORIF WRIST FRACTURE Left 2010     Dr. Burney Gauze.  Marland Kitchen POLYPECTOMY    . ROTATOR CUFF REPAIR Right 2006  . TONSILLECTOMY    . TONSILLECTOMY    . TOTAL KNEE ARTHROPLASTY Right 08/2010    I have reviewed the social history and family history with the patient and they are unchanged from previous note.  ALLERGIES:  is allergic to albuterol sulfate; levaquin [levofloxacin in d5w]; doxycycline; lactose intolerance (gi); amoxicillin; codeine; penicillins; sulfa antibiotics; and sulfonamide derivatives.  MEDICATIONS:  Current Outpatient Prescriptions  Medication Sig Dispense Refill  . acetaminophen (TYLENOL) 500 MG tablet Take 1,000 mg by mouth every 6 (six) hours as needed for headache (pain). Not to exceed 3012m daily    . Amino Acids-Protein Hydrolys (FEEDING SUPPLEMENT, PRO-STAT SUGAR FREE 64,) LIQD Take 30 mLs by mouth 2 (two) times daily. 900 mL 0  . anagrelide (AGRYLIN) 1 MG capsule  Take 2 tab in the morning, and 1 tab in everning 90 capsule 0  . aspirin EC 81 MG tablet Take 1 tablet (81 mg total) by mouth daily. 90 tablet 3  . bimatoprost (LUMIGAN) 0.01 % SOLN Place 1 drop into both eyes at bedtime. 2.5 mL 3  . budesonide-formoterol (SYMBICORT) 160-4.5 MCG/ACT inhaler Inhale 2 puffs into the lungs 2 (two) times daily. 1 Inhaler 2  . clonazePAM (KLONOPIN) 0.5 MG tablet Take 0.5 tablets (0.25 mg total) by mouth 3 (three) times daily as needed (anxiety). 15 tablet 0  . dicyclomine (BENTYL) 10 MG capsule Take 1 capsule (10 mg total) by mouth 3 (three) times daily before meals. IBS 90 capsule 3  . diltiazem (CARDIZEM CD) 360 MG 24 hr capsule Take 1 capsule (360 mg total) by mouth daily. 30 capsule 0  . furosemide (LASIX) 20 MG tablet Take 1 tablet (20 mg total) by mouth daily. 30 tablet 0  . levalbuterol (XOPENEX) 1.25 MG/0.5ML nebulizer solution Take 1.25 mg by nebulization every 6 (six) hours as needed for wheezing or shortness of breath. 1 each 12  . loperamide (IMODIUM) 2 MG capsule Take 2 mg by mouth as needed for diarrhea  or loose stools.     Marland Kitchen losartan (COZAAR) 50 MG tablet Take 1 tablet (50 mg total) by mouth 2 (two) times daily. 60 tablet 0  . magnesium oxide (MAG-OX) 400 MG tablet Take 400 mg by mouth daily as needed (leg cramps).    . metoprolol tartrate (LOPRESSOR) 50 MG tablet Take 1 tablet (50 mg total) by mouth 2 (two) times daily. 60 tablet 0  . Multiple Vitamin (MULTIVITAMIN WITH MINERALS) TABS tablet Take 1 tablet by mouth daily.    . polyvinyl alcohol (LIQUIFILM TEARS) 1.4 % ophthalmic solution Place 1 drop into both eyes 3 (three) times daily.    Marland Kitchen zolpidem (AMBIEN) 5 MG tablet Take 1 tablet (5 mg total) by mouth at bedtime as needed for sleep. 30 tablet 5   No current facility-administered medications for this visit.     PHYSICAL EXAMINATION:  ECOG PERFORMANCE STATUS: 3  Vitals:   03/30/17 1348  BP: (!) 133/45  Pulse: 91  Resp: 18  Temp: 98 F (36.7  C)   Filed Weights   03/30/17 1348  Weight: 128 lb 1.6 oz (58.1 kg)     GENERAL:alert, no distress and comfortable SKIN: skin color, texture, turgor are normal, no rashes or significant lesions EYES: normal, Conjunctiva are pink and non-injected, sclera clear OROPHARYNX:no exudate, no erythema and lips, buccal mucosa, and tongue normal  EARS: (+) Hearing aids. NECK: supple, thyroid normal size, non-tender, without nodularity LYMPH:  no palpable lymphadenopathy in the cervical, axillary or inguinal LUNGS: clear to auscultation and percussion with normal breathing effort HEART: rhythm and no murmurs and no lower extremity edema (+) atrial fibrillation (+) tachycardia  ABDOMEN:abdomen soft, non-tender and normal bowel sounds Musculoskeletal:no cyanosis of digits and no clubbing. (+) Ambulatory with a walker. NEURO: alert & oriented x 3 with fluent speech, no focal motor/sensory deficits  LABORATORY DATA:  I have reviewed the data as listed CBC Latest Ref Rng & Units 03/14/2017 03/07/2017 03/06/2017  WBC 3.9 - 10.3 10e3/uL 3.1(L) 4.4 5.6  Hemoglobin 11.6 - 15.9 g/dL 8.9(L) 8.1(L) 8.6(L)  Hematocrit 34.8 - 46.6 % 28.6(L) 26.8(L) 28.1(L)  Platelets 145 - 400 10e3/uL 352 338 230   CMP Latest Ref Rng & Units 03/14/2017 03/07/2017 03/06/2017  Glucose 70 - 140 mg/dl 126 84 94  BUN 7.0 - 26.0 mg/dL 19.6 18 15   Creatinine 0.6 - 1.1 mg/dL 0.8 0.67 0.67  Sodium 136 - 145 mEq/L 140 135 133(L)  Potassium 3.5 - 5.1 mEq/L 4.0 3.7 3.3(L)  Chloride 101 - 111 mmol/L - 101 98(L)  CO2 22 - 29 mEq/L 23 25 27   Calcium 8.4 - 10.4 mg/dL 9.2 7.9(L) 7.8(L)  Total Protein 6.4 - 8.3 g/dL 6.2(L) 5.0(L) 4.8(L)  Total Bilirubin 0.20 - 1.20 mg/dL 0.47 0.8 0.8  Alkaline Phos 40 - 150 U/L 67 52 48  AST 5 - 34 U/L 12 17 14(L)  ALT 0 - 55 U/L 15 17 15    PATHOLOGY:  11/14/2016   Diagnosis 11/07/2016 Bone Marrow, Aspirate,Biopsy, and Clot, left iliac crest - HYPERCELLULAR BONE MARROW FOR AGE WITH FEATURES OF  MYELOID NEOPLASM. - SEE COMMENT. PERIPHERAL BLOOD: - MACROCYTIC ANEMIA. - LEUKOPENIA. - MARKED THROMBOCYTOSIS. Diagnosis Note The sections show a hypercellular bone marrow for age with dyspoietic changes involving myeloid cell lines associated with prominent increase in megakaryocytes and peripheral thrombocytosis. Definite increase in blastic cells is not identified by morphology, immunohistochemistry or flow cytometric studies. The overall features are most compatible with a myelodysplastic/myeloproliferative neoplasm. Correlation with cytogenetic  and FISH studies is recommended. (BNS:kh 11-08-16)   RADIOGRAPHIC STUDIES: I have personally reviewed the radiological images as listed and agreed with the findings in the report.  CT Angio Chest PE 01/04/17 IMPRESSION: 1. No evidence of pulmonary emboli or other acute abnormality in the chest. 2. Scattered small lung nodules measuring up to 3 mm in size including one new left lower lobe nodule, most likely benign. 3.  Aortic Atherosclerosis (ICD10-I70.0).  DG Chest 2 View 12/25/2016 IMPRESSION: No active cardiopulmonary disease.  US Abdomen 11/23/2016 IMPRESSION: No acute abnormality of the liver or spleen. A 7 mm cyst in the right hepatic lobe is observed. No gallstones or sonographic evidence of acute cholecystitis. Bilateral renal cysts.   ECHO 12/26/16 Study Conclusions - Left ventricle: The cavity size was normal. There was mild   concentric hypertrophy. Systolic function was normal. The   estimated ejection fraction was in the range of 60% to 65%. Wall   motion was normal; there were no regional wall motion   abnormalities. The study was not technically sufficient to allow   evaluation of LV diastolic dysfunction due to atrial   fibrillation. - Aortic valve: Trileaflet; normal thickness leaflets. There was no   regurgitation. - Aortic root: The aortic root was normal in size. - Right ventricle: The cavity size was normal.  Wall thickness was   normal. Systolic function was normal. - Right atrium: The atrium was normal in size. - Tricuspid valve: There was mild regurgitation. - Pulmonary arteries: Systolic pressure was moderately increased.   PA peak pressure: 49 mm Hg (S). - Systemic veins: Not visualized. - Pericardium, extracardiac: There was no pericardial effusion.  ASSESSMENT & PLAN:  83 y.o. female presents With worsening fatigue and dyspnea on exertion since November 2017, CBC reviewed severe neutropenia, moderate anemia, and significant thrombocytosis.  1. 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 Talbert Cage, I previously reviewed the lab results with her. -Her iron study, vitamin S97, folic acid levels are normal, however she does have elevated methylmalonic acid level, support mild B12 deficiency. I do not think her B12 deficiency can explain her abnormal CBC, especially thrombocytosis, however it may contribute to her severe fatigue. I recommend her to start B12 injection today and continue monthly. She does take B12 orally. -No lab evidence of hemolysis, haptoglobin was normal, LDH was normal -SPEP was negative for M protein -I previously reviewed her bone marrow biopsy with patient and her husband, which showed MDS and MPN, no evidence for myelofibrosis. -We previously reviewed the nature history of MDS and MPN, unfortunately this is not curable disease, but is very treatable. -Her cytogenetics is normal, her MDS IPSS-R score 2.5, low risk, which predicts median survival of 5 years. Due to her MPN, her overall risk is probably little higher. -She has started Aranesp 138mg every 2 weeks, still has moderate anemia, I'll increase to 3035m every 2 weeks -Her thrombocytosis has well-controlled with anagrelide now -In general, the life expectancy of patients with MDS and MPN are longer than 6 months. However due to her advanced age, and  other multiple comorbidities, her prognosis is much worse than average patients. I previously encouraged patient to continue treatment if she is able to tolerate, but if she declines further, and not able to continue treatment, I think hospice is reasonable. She is in complete agreement and wants to continue therapy as long as she can tolerate. Pt has repeated asked about her  prognosis, I have explained to her and her daughter again today  -continue monitoring and supportive care  -due to her chest pain this morning, I did a EKG in the clinic, which was unremarkable.  2.  Severe fatigue and dyspnea on exertion -Likely related to her moderate anemia, and underlying MDS/MPN -Improved after she started treatment, however her fatigue got much worse again lately. -She had an episode of syncope a few weeks ago, was hospitalized, and seen by cardiologist. Her echo showed normal EF. -I previously encouraged continued follow-up with her cardiologist. -I did vitals working in the office and her oxygen level remains to be 94-98% -Due to her severe symptoms and high risk of thrombosis from her underline MDS/MPN, I previously ordered a CTA of the chest to ruled out PE. CTA on 01/04/17 showed no evidence of PE. -her dyspnea is probably related to her heart condition -Her PCP is trying to get home oxygen for her  3. AF with RVR -Her cardiac exam today showed atrial fibrillation, with heart rate in 110s -She has had intermittent dyspnea, sometimes severe. I encouraged her to f/u with her cardiologist NP Kathrynn Humble   4. Mild B12 deficiency -She has normal E 12 level, however methylmalonic acid level was elevated significantly -I have start her on B12 injection monthly  5. Depression and Anxiety -she is depressed because she is not being able to do all of the activities she use to do - I encouraged her to take Klonopin to help  6. HTN -her hypertension meds has been held by her cardiologist recently due  to hypotension and syncope -continue f/u with cardiology   7. Insomnia -I suggested melatonin.  8. Goal of care discussion  -We again discussed the incurable nature of her cancer, and the overall poor prognosis, especially if she does not have good response to treatment -The patient understands the goal of care is palliative. -she is agreeable with hospice if she declines further  -I recommend DNR/DNI, she is in agreement. She is not afraid of dying.    Plan -Lab and injection every 2 weeks X4 - increase Aranesp dosage today from 128mg to 3012m - order EKG today  -f/u in 8 weeks     All questions were answered. The patient knows to call the clinic with any problems, questions or concerns. No barriers to learning was detected.  I spent 25 minutes counseling the patient face to face. The total time spent in the appointment was 30 minutes and more than 50% was on counseling and review of test results  This document serves as a record of services personally performed by YaTruitt MerleMD. It was created on her behalf by TaBrandt Loosena trained medical scribe. The creation of this record is based on the scribe's personal observations and the provider's statements to them. This document has been checked and approved by the attending provider.   I have reviewed the above documentation for accuracy and completeness and I agree with the above.    FeTruitt MerleMD 03/30/2017

## 2017-03-30 ENCOUNTER — Telehealth: Payer: Self-pay | Admitting: Hematology

## 2017-03-30 ENCOUNTER — Ambulatory Visit: Payer: Medicare Other

## 2017-03-30 ENCOUNTER — Ambulatory Visit (HOSPITAL_BASED_OUTPATIENT_CLINIC_OR_DEPARTMENT_OTHER): Payer: Medicare Other

## 2017-03-30 ENCOUNTER — Ambulatory Visit (HOSPITAL_BASED_OUTPATIENT_CLINIC_OR_DEPARTMENT_OTHER): Payer: Medicare Other | Admitting: Hematology

## 2017-03-30 ENCOUNTER — Other Ambulatory Visit: Payer: Self-pay

## 2017-03-30 VITALS — BP 133/45 | HR 91 | Temp 98.0°F | Resp 18 | Ht 65.0 in | Wt 128.1 lb

## 2017-03-30 DIAGNOSIS — F419 Anxiety disorder, unspecified: Secondary | ICD-10-CM

## 2017-03-30 DIAGNOSIS — D519 Vitamin B12 deficiency anemia, unspecified: Secondary | ICD-10-CM

## 2017-03-30 DIAGNOSIS — D469 Myelodysplastic syndrome, unspecified: Secondary | ICD-10-CM

## 2017-03-30 DIAGNOSIS — G4700 Insomnia, unspecified: Secondary | ICD-10-CM

## 2017-03-30 DIAGNOSIS — F329 Major depressive disorder, single episode, unspecified: Secondary | ICD-10-CM

## 2017-03-30 DIAGNOSIS — R530 Neoplastic (malignant) related fatigue: Secondary | ICD-10-CM

## 2017-03-30 DIAGNOSIS — R0600 Dyspnea, unspecified: Secondary | ICD-10-CM

## 2017-03-30 DIAGNOSIS — D61818 Other pancytopenia: Secondary | ICD-10-CM

## 2017-03-30 LAB — CBC & DIFF AND RETIC
BASO%: 0.6 % (ref 0.0–2.0)
Basophils Absolute: 0 10*3/uL (ref 0.0–0.1)
EOS ABS: 0 10*3/uL (ref 0.0–0.5)
EOS%: 0 % (ref 0.0–7.0)
HCT: 28.1 % — ABNORMAL LOW (ref 34.8–46.6)
HGB: 9 g/dL — ABNORMAL LOW (ref 11.6–15.9)
Immature Retic Fract: 4.8 % (ref 1.60–10.00)
LYMPH#: 0.6 10*3/uL — AB (ref 0.9–3.3)
LYMPH%: 35.2 % (ref 14.0–49.7)
MCH: 30.1 pg (ref 25.1–34.0)
MCHC: 32 g/dL (ref 31.5–36.0)
MCV: 94 fL (ref 79.5–101.0)
MONO#: 0.1 10*3/uL (ref 0.1–0.9)
MONO%: 3.4 % (ref 0.0–14.0)
NEUT%: 60.8 % (ref 38.4–76.8)
NEUTROS ABS: 1.1 10*3/uL — AB (ref 1.5–6.5)
Platelets: 276 10*3/uL (ref 145–400)
RBC: 2.99 10*6/uL — AB (ref 3.70–5.45)
RDW: 17.1 % — ABNORMAL HIGH (ref 11.2–14.5)
RETIC %: 0.7 % (ref 0.70–2.10)
RETIC CT ABS: 20.93 10*3/uL — AB (ref 33.70–90.70)
WBC: 1.8 10*3/uL — AB (ref 3.9–10.3)
nRBC: 0 % (ref 0–0)

## 2017-03-30 MED ORDER — DARBEPOETIN ALFA 300 MCG/0.6ML IJ SOSY
300.0000 ug | PREFILLED_SYRINGE | Freq: Once | INTRAMUSCULAR | Status: AC
  Administered 2017-03-30: 300 ug via SUBCUTANEOUS
  Filled 2017-03-30: qty 0.6

## 2017-03-30 MED ORDER — DARBEPOETIN ALFA 150 MCG/0.3ML IJ SOSY
150.0000 ug | PREFILLED_SYRINGE | Freq: Once | INTRAMUSCULAR | Status: DC
  Filled 2017-03-30: qty 0.3

## 2017-03-30 NOTE — Progress Notes (Signed)
Aranesp given by Desk nurse.

## 2017-03-30 NOTE — Telephone Encounter (Signed)
Scheduled appt per 7/12 los - Gave patient AVS and calender per los.  

## 2017-04-02 ENCOUNTER — Encounter: Payer: Self-pay | Admitting: Hematology

## 2017-04-02 DIAGNOSIS — J449 Chronic obstructive pulmonary disease, unspecified: Secondary | ICD-10-CM | POA: Diagnosis not present

## 2017-04-02 DIAGNOSIS — J45998 Other asthma: Secondary | ICD-10-CM | POA: Diagnosis not present

## 2017-04-04 DIAGNOSIS — R2681 Unsteadiness on feet: Secondary | ICD-10-CM | POA: Diagnosis not present

## 2017-04-04 DIAGNOSIS — I4891 Unspecified atrial fibrillation: Secondary | ICD-10-CM | POA: Diagnosis not present

## 2017-04-04 DIAGNOSIS — J90 Pleural effusion, not elsewhere classified: Secondary | ICD-10-CM | POA: Diagnosis not present

## 2017-04-04 DIAGNOSIS — R58 Hemorrhage, not elsewhere classified: Secondary | ICD-10-CM | POA: Diagnosis not present

## 2017-04-04 DIAGNOSIS — J449 Chronic obstructive pulmonary disease, unspecified: Secondary | ICD-10-CM | POA: Diagnosis not present

## 2017-04-04 LAB — CBC AND DIFFERENTIAL
HCT: 27 — AB (ref 36–46)
Hemoglobin: 8.4 — AB (ref 12.0–16.0)
Platelets: 240 (ref 150–399)
WBC: 3.6

## 2017-04-06 ENCOUNTER — Telehealth: Payer: Self-pay | Admitting: Nurse Practitioner

## 2017-04-06 DIAGNOSIS — J189 Pneumonia, unspecified organism: Secondary | ICD-10-CM | POA: Diagnosis not present

## 2017-04-06 DIAGNOSIS — E860 Dehydration: Secondary | ICD-10-CM | POA: Diagnosis not present

## 2017-04-06 DIAGNOSIS — M6281 Muscle weakness (generalized): Secondary | ICD-10-CM | POA: Diagnosis not present

## 2017-04-06 DIAGNOSIS — I10 Essential (primary) hypertension: Secondary | ICD-10-CM | POA: Diagnosis not present

## 2017-04-06 DIAGNOSIS — R0602 Shortness of breath: Secondary | ICD-10-CM | POA: Diagnosis not present

## 2017-04-06 LAB — BASIC METABOLIC PANEL
BUN: 16 (ref 4–21)
CREATININE: 0.8 (ref 0.5–1.1)
Glucose: 140
Potassium: 4.5 (ref 3.4–5.3)
Sodium: 138 (ref 137–147)

## 2017-04-06 LAB — HEPATIC FUNCTION PANEL
ALT: 171 — AB (ref 7–35)
AST: 54 — AB (ref 13–35)
Alkaline Phosphatase: 316 — AB (ref 25–125)
BILIRUBIN, TOTAL: 0.6

## 2017-04-06 NOTE — Telephone Encounter (Signed)
New Message    Pt c/o medication issue:  1. Name of Medication: diltiazem (CARDIZEM CD) 360 MG 24 hr capsule  2. How are you currently taking this medication (dosage and times per day)? As Prescribed  3. Are you having a reaction (difficulty breathing--STAT)? No  4. What is your medication issue? Patient being admitted to [facility], and was told by their pharmacist that dosage is higher than recommended dosage. Requesting call back

## 2017-04-06 NOTE — Telephone Encounter (Signed)
I spoke with Maudie Mercury (nurse at [facility]). She reports pt has been in independent living section and home health manages her medications.  She is now in Rehab and review of medication list indicates pt is taking Cardizem CD 360 mg twice daily.  Maudie Mercury is calling for clarification.  I told Kim pt's dose is Cardizem CD 360 mg once daily.  She will follow up to confirm how pt has been taking.

## 2017-04-07 ENCOUNTER — Encounter: Payer: Self-pay | Admitting: Adult Health

## 2017-04-07 ENCOUNTER — Non-Acute Institutional Stay (SKILLED_NURSING_FACILITY): Payer: Medicare Other | Admitting: Adult Health

## 2017-04-07 DIAGNOSIS — R740 Nonspecific elevation of levels of transaminase and lactic acid dehydrogenase [LDH]: Secondary | ICD-10-CM

## 2017-04-07 DIAGNOSIS — I471 Supraventricular tachycardia: Secondary | ICD-10-CM

## 2017-04-07 DIAGNOSIS — I10 Essential (primary) hypertension: Secondary | ICD-10-CM | POA: Diagnosis not present

## 2017-04-07 DIAGNOSIS — D469 Myelodysplastic syndrome, unspecified: Secondary | ICD-10-CM | POA: Diagnosis not present

## 2017-04-07 DIAGNOSIS — J181 Lobar pneumonia, unspecified organism: Secondary | ICD-10-CM | POA: Diagnosis not present

## 2017-04-07 DIAGNOSIS — J439 Emphysema, unspecified: Secondary | ICD-10-CM | POA: Diagnosis not present

## 2017-04-07 DIAGNOSIS — I5032 Chronic diastolic (congestive) heart failure: Secondary | ICD-10-CM | POA: Diagnosis not present

## 2017-04-07 DIAGNOSIS — R7401 Elevation of levels of liver transaminase levels: Secondary | ICD-10-CM

## 2017-04-07 DIAGNOSIS — K580 Irritable bowel syndrome with diarrhea: Secondary | ICD-10-CM

## 2017-04-07 DIAGNOSIS — R296 Repeated falls: Secondary | ICD-10-CM

## 2017-04-07 NOTE — Progress Notes (Signed)
Location:  Occupational psychologist of Service:  SNF (31) Provider:   Cindi Carbon, [employer] [phone]  Leanna Battles, MD  Patient Care Team: Leanna Battles, MD as PCP - General (Internal Medicine) Brand Males, MD as Consulting Physician (Pulmonary Disease) [name], MD as Consulting Physician (Orthopedic Surgery)  Extended Emergency Contact Information Primary Emergency Contact: Allan,Tom Address: [address]          [place], [place] [address] [name] of [place] Phone: [phone] Mobile Phone: [phone] Relation: Spouse Secondary Emergency Contact: Hendrix,[name]  [place] Mobile Phone: [phone] Relation: Daughter  Code Status:  DNR Goals of care: Advanced Directive information Advanced Directives 04/07/2017  Does Patient Have a Medical Advance Directive? Yes  Type of Paramedic of [place];Living will;Out of facility DNR (pink MOST or yellow form)  Does patient want to make changes to medical advance directive? No - Patient declined  Copy of Media in Chart? Yes  Would patient like information on creating a medical advance directive? -  Pre-existing out of facility DNR order (yellow form or pink MOST form) Yellow form placed in chart (order not valid for inpatient use);Pink MOST form placed in chart (order not valid for inpatient use)     Chief Complaint  Patient presents with  . Acute Visit    weakness, pos CXR    HPI:  Pt is a 83 y.o. female seen for weakness and positive CXR.  Seen by Dr Philip Aspen on 04/04/17 for wrist pain with a skin tear.  Antibiotic ointment and a CBC were obtained but I do not have the results of these. The following day [facility] was called in the independent living section due to increased weakness and cough.  CXR was obtained showing a RLL infiltrate and small right pleural effusion. She has a cough  with sputum production. No fever. Has DOE that is slightly worse per her caregiver. Ms. Domenico is [facility] and so I had difficulty obtaining a hx. She has been diagnosed with MDS and is followed by Dr. Burr Medico.  Currently she is on aranesp and anagrelide.  Her overall prognosis is poor and the last note from Dr. Burr Medico indicated that hospice would be appropriate.  She was ordered IVF for 5 days but she accidentally pulled it out after 700cc.  Her labs indicated normal BUN/CR and NA.  LFTs were elevated which was new compared to previous numbers ALT 171 AST 54 ALK Phos 316 Albumin 3.4 (no abd pain, vomiting, or fever) Pro BNP 1444 PFTs in 3/10 with FEV1 50% predicted with obstruction.  Echo in 5/15 showed EF 60-65% with grade II diastolic dysfunction and normal RV.    Past Medical History:  Diagnosis Date  . ALLERGIC RHINITIS   . Anxiety   . Asthma   . Atrial fibrillation ([place])   . Back pain of thoracolumbar region    right  . Bronchiectasis   . Colles' fracture of left radius   . COPD (chronic obstructive pulmonary disease) ([place])   . Coronary artery disease   . Diastolic dysfunction   . Diverticulosis   . Dysrhythmia   . History of pneumonia   . Hx of cardiovascular stress test    a. ETT-MV 1/14: Exercised 4:16, no ECG changes, poor ex tol, ant defect c/w soft tissue atten, no ischemia, EF 75%  . Hx of colonic polyp   . Hypertension   . IBS (irritable  bowel syndrome)   . Insomnia   . Multiple rib fractures 10/2008   bilateral  . Pleural effusion, left   . Pruritus   . Pulmonary nodule   . Syncope    Past Surgical History:  Procedure Laterality Date  . ANKLE CLOSED REDUCTION Right 12/11/2014   Procedure: CLOSED REDUCTION ANKLE;  Surgeon: Rod Can, MD;  Location: [facility];  Service: Orthopedics;  Laterality: Right;  . APPENDECTOMY    . BLADDER SUSPENSION  2005   A-P  . CHOLECYSTECTOMY    . COLONOSCOPY  04/06/2012   Procedure: COLONOSCOPY;  Surgeon: Jerene Bears, MD;  Location: [facility]  ENDOSCOPY;  Service: Gastroenterology;  Laterality: N/A;  . EXTERNAL FIXATION LEG Right 12/12/2014   Procedure: ADJUSTMENT OF EXTERNAL FIXATION  RIGHT ANKLE;  Surgeon: Rod Can, MD;  Location: [facility];  Service: Orthopedics;  Laterality: Right;  . EXTERNAL FIXATION LEG Right 12/11/2014   Procedure: POSSIBLE EXTERNAL FIXATION RIGHT ANKLE;  Surgeon: Rod Can, MD;  Location: [facility];  Service: Orthopedics;  Laterality: Right;  . HARDWARE REMOVAL Right 12/25/2014   Procedure: RIGHT ANKLE REMOVE OF DEEP IMPLANT ANE EXTERNAL FIXATOR ;  Surgeon: [name], MD;  Location: [facility];  Service: Orthopedics;  Laterality: Right;  . ORIF ANKLE FRACTURE Right 12/25/2014   Procedure: OPEN REDUCTION INTERNAL FIXATION (ORIF)TRIMALLEOLAR  ANKLE FRACTURE;  Surgeon: [name], MD;  Location: [facility];  Service: Orthopedics;  Laterality: Right;  . ORIF WRIST FRACTURE Left 2010     Dr. Burney Gauze.  Marland Kitchen POLYPECTOMY    . ROTATOR CUFF REPAIR Right 2006  . TONSILLECTOMY    . TONSILLECTOMY    . TOTAL KNEE ARTHROPLASTY Right 08/2010    Allergies  Allergen Reactions  . Albuterol Sulfate Palpitations  . Levaquin [Levofloxacin In D5w] Other (See Comments)    QT prolongation. Should avoid all flouroquinolones.   . Doxycycline Diarrhea  . Lactose Intolerance (Gi) Other (See Comments)  . Amoxicillin Other (See Comments)    REACTION: unspecified  . Codeine Other (See Comments)    Can take Hydrocodone  . Penicillins Hives, Itching and Rash    Hard lump and rash Has patient had a PCN reaction causing immediate rash, facial/tongue/throat swelling, SOB or lightheadedness with hypotension: Yes Has patient had a PCN reaction causing severe rash involving mucus membranes or skin necrosis: No Has patient had a PCN reaction that required hospitalization unknown Has patient had a PCN reaction occurring within the last 10 years: No If all of the above answers are "NO", then may proceed with  Cephalosporin use.  . Sulfa Antibiotics Nausea Only  . Sulfonamide Derivatives Nausea Only    Allergies as of 04/07/2017      Reactions   Albuterol Sulfate Palpitations   Levaquin [levofloxacin In D5w] Other (See Comments)   QT prolongation. Should avoid all flouroquinolones.    Doxycycline Diarrhea   Lactose Intolerance (gi) Other (See Comments)   Amoxicillin Other (See Comments)   REACTION: unspecified   Codeine Other (See Comments)   Can take Hydrocodone   Penicillins Hives, Itching, Rash   Hard lump and rash Has patient had a PCN reaction causing immediate rash, facial/tongue/throat swelling, SOB or lightheadedness with hypotension: Yes Has patient had a PCN reaction causing severe rash involving mucus membranes or skin necrosis: No Has patient had a PCN reaction that required hospitalization unknown Has patient had a PCN reaction occurring within the last 10 years: No If all of the above answers are "NO",  then may proceed with Cephalosporin use.   Sulfa Antibiotics Nausea Only   Sulfonamide Derivatives Nausea Only      Medication List       Accurate as of 04/07/17 11:24 AM. Always use your most recent med list.          acetaminophen 500 MG tablet Commonly known as:  TYLENOL Take 1,000 mg by mouth every 6 (six) hours as needed for headache (pain). Not to exceed 309m daily   anagrelide 1 MG capsule Commonly known as:  AGRYLIN Take 2 tab in the morning, and 1 tab in everning   aspirin EC 81 MG tablet Take 1 tablet (81 mg total) by mouth daily.   bimatoprost 0.01 % Soln Commonly known as:  LUMIGAN Place 1 drop into both eyes at bedtime.   budesonide-formoterol 160-4.5 MCG/ACT inhaler Commonly known as:  SYMBICORT Inhale 2 puffs into the lungs 2 (two) times daily.   clonazePAM 0.5 MG tablet Commonly known as:  KLONOPIN Take 0.5 tablets (0.25 mg total) by mouth 3 (three) times daily as needed (anxiety).   dicyclomine 10 MG capsule Commonly known as:   BENTYL Take 1 capsule (10 mg total) by mouth 3 (three) times daily before meals. IBS   diltiazem 360 MG 24 hr capsule Commonly known as:  CARDIZEM CD Take 1 capsule (360 mg total) by mouth daily.   feeding supplement (PRO-STAT SUGAR FREE 64) Liqd Take 30 mLs by mouth 2 (two) times daily.   furosemide 20 MG tablet Commonly known as:  LASIX Take 1 tablet (20 mg total) by mouth daily.   ipratropium 0.02 % nebulizer solution Commonly known as:  ATROVENT Take 0.5 mg by nebulization 4 (four) times daily.   levalbuterol 1.25 MG/0.5ML nebulizer solution Commonly known as:  XOPENEX Take 1.25 mg by nebulization every 6 (six) hours as needed for wheezing or shortness of breath.   loperamide 2 MG capsule Commonly known as:  IMODIUM Take 2 mg by mouth as needed for diarrhea or loose stools.   losartan 50 MG tablet Commonly known as:  COZAAR Take 1 tablet (50 mg total) by mouth 2 (two) times daily.   magnesium oxide 400 MG tablet Commonly known as:  MAG-OX Take 400 mg by mouth daily as needed (leg cramps).   metoprolol tartrate 50 MG tablet Commonly known as:  LOPRESSOR Take 1 tablet (50 mg total) by mouth 2 (two) times daily.   multivitamin with minerals Tabs tablet Take 1 tablet by mouth daily.   polyvinyl alcohol 1.4 % ophthalmic solution Commonly known as:  LIQUIFILM TEARS Place 1 drop into both eyes 3 (three) times daily.   zolpidem 5 MG tablet Commonly known as:  AMBIEN Take 1 tablet (5 mg total) by mouth at bedtime as needed for sleep.       Review of Systems  Constitutional: Positive for activity change and fatigue. Negative for appetite change, chills, diaphoresis, fever and unexpected weight change.       HOH  HENT: Positive for congestion and rhinorrhea. Negative for sneezing, sore throat and trouble swallowing.   Eyes: Negative for visual disturbance.  Respiratory: Positive for cough and shortness of breath. Negative for wheezing.   Cardiovascular: Positive for  palpitations. Negative for chest pain and leg swelling.  Gastrointestinal: Positive for diarrhea. Negative for abdominal distention and constipation.  Genitourinary: Negative for difficulty urinating and dysuria.  Musculoskeletal: Positive for gait problem. Negative for arthralgias and back pain.  Skin: Positive for wound. Negative for rash.  Neurological: Positive for weakness. Negative for dizziness, tremors, seizures, syncope, facial asymmetry, speech difficulty, light-headedness, numbness and headaches.  Psychiatric/Behavioral: Negative for agitation, behavioral problems and confusion.    Immunization History  Administered Date(s) Administered  . H1N1 09/30/2008  . Influenza Split 05/30/2012, 07/01/2013  . Influenza Whole 06/19/2009, 06/20/2011  . Influenza, High Dose Seasonal PF 07/01/2013  . Influenza,inj,Quad PF,36+ Mos 06/05/2014  . Pneumococcal Conjugate-13 08/20/2013  . Pneumococcal Polysaccharide-23 11/18/1992, 08/25/2010  . Tetanus 08/20/2013  . Zoster 09/19/2004   Pertinent  Health Maintenance Due  Topic Date Due  . DEXA SCAN  03/15/1993  . INFLUENZA VACCINE  04/19/2017  . PNA vac Low Risk Adult  Completed   Fall Risk  06/06/2014  Falls in the past year? No   Functional Status Survey:    Vitals:   04/07/17 1122  BP: (!) 150/70  Pulse: 98  Resp: (!) 23  Temp: (!) 97 F (36.1 C)  SpO2: 96%   There is no height or weight on file to calculate BMI. Physical Exam  Constitutional: She is oriented to person, place, and time. No distress.  HENT:  Head: Normocephalic and atraumatic.  Nose: Nose normal.  Mouth/Throat: Oropharynx is clear and moist. No oropharyngeal exudate.  Eyes: Pupils are equal, round, and reactive to light. Conjunctivae are normal. Right eye exhibits no discharge.  Neck: No JVD present.  Cardiovascular: Normal rate and regular rhythm.   No murmur heard. Pulmonary/Chest: Effort normal. No respiratory distress. She has wheezes.  Decreased  right base  Abdominal: Soft. Bowel sounds are normal. She exhibits no distension. There is no tenderness.  Musculoskeletal: She exhibits no edema or tenderness.  Neurological: She is alert and oriented to person, place, and time.  Skin: Skin is warm and dry. She is not diaphoretic.  Psychiatric: She has a normal mood and affect.    Labs reviewed:  Recent Labs  12/25/16 1550  03/02/17 1504  03/05/17 0326 03/06/17 0226 03/07/17 0333 03/14/17 1238 04/06/17  NA 138  < > 138  < > 137 133* 135 140 138  K 3.7  < > 3.3*  < > 3.2* 3.3* 3.7 4.0 4.5  CL 106  < > 108  < > 101 98* 101  --   --   CO2 23  < > 23  < > _0 --   GLUCOSE 124*  < > 126*  < > 80 94 84 126  --   BUN 23*  < > 24*  < > _1 19.6 16  CREATININE 0.76  0.77  < > 0.81  < > 0.74 0.67 0.67 0.8 0.8  CALCIUM 9.1  < > 8.4*  < > 7.9* 7.8* 7.9* 9.2  --   MG 2.1  --  2.0  --   --   --   --   --   --   < > = values in this interval not displayed.  Recent Labs  03/06/17 0226 03/07/17 0333 03/14/17 1238 04/06/17  AST 14* 17 12 54*  ALT _2 171*  ALKPHOS 48 52 67 316*  BILITOT 0.8 0.8 0.47  --   PROT 4.8* 5.0* 6.2*  --   ALBUMIN 2.7* 2.8* 3.2*  --     Recent Labs  03/02/17 1504  03/07/17 0333 03/14/17 1238 03/30/17 1452  WBC 5.9  < > 4.4 3.1* 1.8*  NEUTROABS 3.8  --   --  2.1 1.1*  HGB 9.8*  < >  8.1* 8.9* 9.0*  HCT 31.5*  < > 26.8* 28.6* 28.1*  MCV 96.6  < > 97.5 96.6 94.0  PLT 292  < > 338 352 276  < > = values in this interval not displayed. Lab Results  Component Value Date   TSH 2.712 03/02/2017   Lab Results  Component Value Date   HGBA1C (H) 10/04/2010    5.7 (NOTE)                                                                       According to the ADA Clinical Practice Recommendations for 2011, when HbA1c is used as a screening test:   >=6.5%   Diagnostic of Diabetes Mellitus           (if abnormal result  is confirmed)  5.7-6.4%   Increased risk of developing Diabetes Mellitus   References:Diagnosis and Classification of Diabetes Mellitus,Diabetes TXHF,4142,39(RVUYE 1):S62-S69 and Standards of Medical Care in         Diabetes - 2011,Diabetes BXID,5686,16  (Suppl 1):S11-S61.   Lab Results  Component Value Date   CHOL 247 (H) 06/05/2014   HDL 75.60 06/05/2014   LDLCALC 156 (H) 06/05/2014   LDLDIRECT 157.6 01/16/2012   TRIG 78.0 06/05/2014   CHOLHDL 3 06/05/2014    Significant Diagnostic Results in last 30 days:  Dg Chest Port 1 View  Result Date: 03/02/2017 CLINICAL DATA:  Shortness of breath tachycardia and productive cough. EXAM: PORTABLE CHEST 1 VIEW COMPARISON:  February 22, 2017. FINDINGS: The heart size and mediastinal contours are stable. The heart size is mildly enlarged. There is minimal increased pulmonary interstitium. There is no focal pneumonia or pleural effusion. The visualized skeletal structures are stable. IMPRESSION: Mild cardiomegaly. Minimal increased pulmonary interstitium unchanged. Electronically Signed   By: Abelardo Diesel M.D.   On: 03/02/2017 15:23    Assessment/Plan  1) Lobar pneumonia  Doxycycline 100 mg BID for 7 days with food  Florastor 1 cap BID x 7 days (known hx of diarrhea with this but she has many allergies and has chronic diarrhea due to IBS (not sure if the diarrhea was IBS or med reaction) so we will monitor her symptoms while in rehab and adjust accordingly) xopenex 0.63 mg TID x 72 hrs Atrovent 0.5 mg TID x 72 hrs Oxygen 2L to keep sats >90%  IS 10 breaths QID Would not restart IV, may hydrate orally  2. MDS (myelodysplastic syndrome) ([place]) Followed by Dr. Burr Medico WBC trending down Poor prognosis per Dr. Burr Medico notes, hospice appropriate I discussed this with her daughter [name] and her husband. Ms. Matthew would like hospice and to return home to [place].  Her family feels that she needs skilled care. They would be agreeable to hospice but don't feel she needs it right now while she is in rehab.  Ms. Brienza does not seem to know that  her family wishes for her to transfer to skilled care. She tends to come to rehab for 1-2 days and then leave AMA. If she stays over the weekend we will get therapy involved to assess her level of care.   3. Transaminitis Asymptomatic, unclear etiology Repeat liver function and GGT on Monday Fax labs to oncology for further input (? Medication  effect vs. Liver involvement)  4. Pulmonary emphysema, unspecified emphysema type (HCC) Controlled Continue Symbicort  5. Irritable bowel syndrome with diarrhea Long term issue  Avoid foods that trigger this issue   6. Multifocal atrial tachycardia ([facility]CC) No actual afib documented per cards notes No longer a candidate for anticoagulation  Continue Cardizem and lopressor for rate controlled  7. Frequent falls Multifactorial due to poor intake at home, labile HR an BP, as well as chronic fatigue issues related to MDS Continue walker use and assistance with ambulation Could involve therapy see note #2  8. HTN BP elevated this am but recheck was with in goal of <150/90 No aggressive treatment due to her propensity for falls and hypotension  9. Diastolic CHF Continue ARB therapy and monitor BP Continue lasix 20  Mg qd Monitor electrolytes and weights   Family/ staff Communication: discussed with Mr Kluver and her daughter [name] Labs/tests ordered:  Liver function with GGT monday

## 2017-04-10 DIAGNOSIS — Z79899 Other long term (current) drug therapy: Secondary | ICD-10-CM | POA: Diagnosis not present

## 2017-04-10 DIAGNOSIS — D649 Anemia, unspecified: Secondary | ICD-10-CM | POA: Diagnosis not present

## 2017-04-10 DIAGNOSIS — E860 Dehydration: Secondary | ICD-10-CM | POA: Diagnosis not present

## 2017-04-10 DIAGNOSIS — R740 Nonspecific elevation of levels of transaminase and lactic acid dehydrogenase [LDH]: Secondary | ICD-10-CM | POA: Diagnosis not present

## 2017-04-10 LAB — HEPATIC FUNCTION PANEL
ALT: 133 — AB (ref 7–35)
AST: 67 — AB (ref 13–35)
Alkaline Phosphatase: 379 — AB (ref 25–125)
Bilirubin, Direct: 0.44 — AB (ref 0.01–0.4)
Bilirubin, Total: 0.9

## 2017-04-10 LAB — CBC AND DIFFERENTIAL
HCT: 23 — AB (ref 36–46)
HCT: 23 — AB (ref 36–46)
Hemoglobin: 7.7 — AB (ref 12.0–16.0)
Hemoglobin: 7.7 — AB (ref 12.0–16.0)
Platelets: 356 (ref 150–399)
WBC: 2.5
WBC: 2.5

## 2017-04-11 ENCOUNTER — Telehealth: Payer: Self-pay | Admitting: Hematology

## 2017-04-11 ENCOUNTER — Non-Acute Institutional Stay (SKILLED_NURSING_FACILITY): Payer: Medicare Other | Admitting: Internal Medicine

## 2017-04-11 ENCOUNTER — Encounter: Payer: Self-pay | Admitting: Internal Medicine

## 2017-04-11 DIAGNOSIS — R740 Nonspecific elevation of levels of transaminase and lactic acid dehydrogenase [LDH]: Secondary | ICD-10-CM

## 2017-04-11 DIAGNOSIS — J181 Lobar pneumonia, unspecified organism: Secondary | ICD-10-CM | POA: Diagnosis not present

## 2017-04-11 DIAGNOSIS — I471 Supraventricular tachycardia: Secondary | ICD-10-CM

## 2017-04-11 DIAGNOSIS — R7401 Elevation of levels of liver transaminase levels: Secondary | ICD-10-CM

## 2017-04-11 DIAGNOSIS — K838 Other specified diseases of biliary tract: Secondary | ICD-10-CM

## 2017-04-11 DIAGNOSIS — J449 Chronic obstructive pulmonary disease, unspecified: Secondary | ICD-10-CM

## 2017-04-11 DIAGNOSIS — D469 Myelodysplastic syndrome, unspecified: Secondary | ICD-10-CM

## 2017-04-11 DIAGNOSIS — I5032 Chronic diastolic (congestive) heart failure: Secondary | ICD-10-CM

## 2017-04-11 DIAGNOSIS — I4719 Other supraventricular tachycardia: Secondary | ICD-10-CM

## 2017-04-11 NOTE — Telephone Encounter (Signed)
That's fine. I will inform [facility] that she is due to Aranesp injection 366mcg (every 2 weeks) on 7/26, thanks.   Truitt Merle MD
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
## 2017-04-11 NOTE — Telephone Encounter (Signed)
husdand called to cancel appointment.  Patient is at [facility] and they advised him to cancel her appointment for 7/26 and said that the Dr there would contact Dr Burr Medico

## 2017-04-12 DIAGNOSIS — M6281 Muscle weakness (generalized): Secondary | ICD-10-CM | POA: Diagnosis not present

## 2017-04-12 DIAGNOSIS — D649 Anemia, unspecified: Secondary | ICD-10-CM | POA: Diagnosis not present

## 2017-04-12 DIAGNOSIS — R1011 Right upper quadrant pain: Secondary | ICD-10-CM | POA: Diagnosis not present

## 2017-04-12 DIAGNOSIS — Z79899 Other long term (current) drug therapy: Secondary | ICD-10-CM | POA: Diagnosis not present

## 2017-04-12 LAB — CBC AND DIFFERENTIAL
HCT: 25 — AB (ref 36–46)
Hemoglobin: 7.9 — AB (ref 12.0–16.0)
Platelets: 376 (ref 150–399)
WBC: 2.2

## 2017-04-12 LAB — HEPATIC FUNCTION PANEL
ALT: 85 — AB (ref 7–35)
AST: 32 (ref 13–35)
Alkaline Phosphatase: 333 — AB (ref 25–125)
Bilirubin, Total: 0.6

## 2017-04-13 ENCOUNTER — Other Ambulatory Visit: Payer: Medicare Other

## 2017-04-13 ENCOUNTER — Ambulatory Visit: Payer: Medicare Other

## 2017-04-14 ENCOUNTER — Encounter: Payer: Self-pay | Admitting: Adult Health

## 2017-04-14 ENCOUNTER — Non-Acute Institutional Stay (SKILLED_NURSING_FACILITY): Payer: Medicare Other | Admitting: Adult Health

## 2017-04-14 DIAGNOSIS — I4891 Unspecified atrial fibrillation: Secondary | ICD-10-CM | POA: Diagnosis not present

## 2017-04-14 DIAGNOSIS — J181 Lobar pneumonia, unspecified organism: Secondary | ICD-10-CM

## 2017-04-14 DIAGNOSIS — D630 Anemia in neoplastic disease: Secondary | ICD-10-CM | POA: Diagnosis not present

## 2017-04-14 DIAGNOSIS — I25810 Atherosclerosis of coronary artery bypass graft(s) without angina pectoris: Secondary | ICD-10-CM | POA: Diagnosis not present

## 2017-04-14 DIAGNOSIS — D72818 Other decreased white blood cell count: Secondary | ICD-10-CM | POA: Diagnosis not present

## 2017-04-14 DIAGNOSIS — R54 Age-related physical debility: Secondary | ICD-10-CM | POA: Diagnosis not present

## 2017-04-14 DIAGNOSIS — J90 Pleural effusion, not elsewhere classified: Secondary | ICD-10-CM

## 2017-04-14 DIAGNOSIS — I10 Essential (primary) hypertension: Secondary | ICD-10-CM | POA: Diagnosis not present

## 2017-04-14 DIAGNOSIS — D473 Essential (hemorrhagic) thrombocythemia: Secondary | ICD-10-CM | POA: Diagnosis not present

## 2017-04-14 DIAGNOSIS — M6281 Muscle weakness (generalized): Secondary | ICD-10-CM | POA: Diagnosis not present

## 2017-04-14 DIAGNOSIS — R6889 Other general symptoms and signs: Secondary | ICD-10-CM | POA: Diagnosis not present

## 2017-04-14 DIAGNOSIS — J449 Chronic obstructive pulmonary disease, unspecified: Secondary | ICD-10-CM | POA: Diagnosis not present

## 2017-04-14 DIAGNOSIS — H11149 Conjunctival xerosis, unspecified, unspecified eye: Secondary | ICD-10-CM | POA: Diagnosis not present

## 2017-04-14 DIAGNOSIS — R7401 Elevation of levels of liver transaminase levels: Secondary | ICD-10-CM

## 2017-04-14 DIAGNOSIS — I5030 Unspecified diastolic (congestive) heart failure: Secondary | ICD-10-CM | POA: Diagnosis not present

## 2017-04-14 DIAGNOSIS — R0902 Hypoxemia: Secondary | ICD-10-CM | POA: Diagnosis not present

## 2017-04-14 DIAGNOSIS — K589 Irritable bowel syndrome without diarrhea: Secondary | ICD-10-CM | POA: Diagnosis not present

## 2017-04-14 DIAGNOSIS — J918 Pleural effusion in other conditions classified elsewhere: Secondary | ICD-10-CM | POA: Diagnosis not present

## 2017-04-14 DIAGNOSIS — J189 Pneumonia, unspecified organism: Secondary | ICD-10-CM | POA: Diagnosis not present

## 2017-04-14 DIAGNOSIS — D469 Myelodysplastic syndrome, unspecified: Secondary | ICD-10-CM | POA: Diagnosis not present

## 2017-04-14 DIAGNOSIS — R740 Nonspecific elevation of levels of transaminase and lactic acid dehydrogenase [LDH]: Secondary | ICD-10-CM | POA: Diagnosis not present

## 2017-04-14 DIAGNOSIS — K831 Obstruction of bile duct: Secondary | ICD-10-CM | POA: Diagnosis not present

## 2017-04-14 NOTE — Progress Notes (Signed)
Location:  Occupational psychologist of Service:  SNF (31) Provider:   Cindi Carbon, Perrin [phone]  Leanna Battles, MD  Patient Care Team: Leanna Battles, MD as PCP - General (Internal Medicine) Brand Males, MD as Consulting Physician (Pulmonary Disease) [name], MD as Consulting Physician (Orthopedic Surgery)  Extended Emergency Contact Information Primary Emergency Contact: Mohar,Tom Address: [address]          [place], [place] [address] [name] of [place] Phone: [phone] Mobile Phone: [phone] Relation: Spouse Secondary Emergency Contact: Hendrix,[name]  [place] Mobile Phone: [phone] Relation: Daughter  Code Status:  DNR Goals of care: Advanced Directive information Advanced Directives 04/11/2017  Does Patient Have a Medical Advance Directive? Yes  Type of Paramedic of [place];Out of facility DNR (pink MOST or yellow form)  Does patient want to make changes to medical advance directive? -  Copy of [place] in Chart? Yes  Would patient like information on creating a medical advance directive? -  Pre-existing out of facility DNR order (yellow form or pink MOST form) Yellow form placed in chart (order not valid for inpatient use);Pink MOST form placed in chart (order not valid for inpatient use)     Chief Complaint  Patient presents with  . Acute Visit    f/u liver US and pleural effusion    HPI:  Pt is a 83 y.o. female seen for  F/u regarding a liver US and pleural effusion/pneumonia. A CXR on 7/23 showed right lower lobe infiltrate and small right effusion. She is being treated with 7 days of Doxycycline (see allergy list).  She has some cough but reports decreased sputum production and no fever. She is dependent on 2L of oxygen most of the time due to desaturation and shortness of breath with ambulation by her account She feels  a heaviness in her chest at times that is hard to describe that goes across both sides and seems to come and go without correlation. A follow up CXR 04/14/2017 was ordered which showed bilateral small effusions, resolved right base infiltrate and new mild left base infiltrate.  Transamnitis: A CMP was ordered on 7/23 routinely which showed GGT 692  ALT 133 AST 67 Direct bilirubin 0.440 Alk phos 379. Due to these findings an abd ultrasound was ordered which showed: RUQ ultrasound 04/13/17: large right pleural effusion, post op changes with prior cholecystectomy, abnormal biliary ductal dilation raising the possibility of extra hepatic biliary ductal obstruction, right renal cyst. Common hepatic duct dilated 16 mm She denies any significant pain to the abd and is able to eat and drink without difficulty.   Ms. Adan also has MDS and refused to go for an aranesp injection this past week due to not feeling well. She had decided to forego any additional treatments but has since changed her mind. Her hgb has dropped to 7.7 on 7/23 and was 9.0 on 7/12.   She also takes Agrylin for thrombocytosis. Plt 356,000 She has decided to accept hospice services as well due to MDS and other multiple health problems.   Past Medical History:  Diagnosis Date  . ALLERGIC RHINITIS   . Anxiety   . Asthma   . Atrial fibrillation ([place])   . Back pain of thoracolumbar region    right  . Bronchiectasis   . Colles' fracture of left radius   . COPD (chronic obstructive pulmonary disease) ([place])   . Coronary  artery disease   . Diastolic dysfunction   . Diverticulosis   . Dysrhythmia   . History of pneumonia   . Hx of cardiovascular stress test    a. ETT-MV 1/14: Exercised 4:16, no ECG changes, poor ex tol, ant defect c/w soft tissue atten, no ischemia, EF 75%  . Hx of colonic polyp   . Hypertension   . IBS (irritable bowel syndrome)   . Insomnia   . Multiple rib fractures 10/2008   bilateral  . Pleural effusion, left    . Pruritus   . Pulmonary nodule   . Syncope    Past Surgical History:  Procedure Laterality Date  . ANKLE CLOSED REDUCTION Right 12/11/2014   Procedure: CLOSED REDUCTION ANKLE;  Surgeon: Rod Can, MD;  Location: [facility];  Service: Orthopedics;  Laterality: Right;  . APPENDECTOMY    . BLADDER SUSPENSION  2005   A-P  . CHOLECYSTECTOMY    . COLONOSCOPY  04/06/2012   Procedure: COLONOSCOPY;  Surgeon: Jerene Bears, MD;  Location: [facility];  Service: Gastroenterology;  Laterality: N/A;  . EXTERNAL FIXATION LEG Right 12/12/2014   Procedure: ADJUSTMENT OF EXTERNAL FIXATION  RIGHT ANKLE;  Surgeon: Rod Can, MD;  Location: [facility];  Service: Orthopedics;  Laterality: Right;  . EXTERNAL FIXATION LEG Right 12/11/2014   Procedure: POSSIBLE EXTERNAL FIXATION RIGHT ANKLE;  Surgeon: Rod Can, MD;  Location: [facility];  Service: Orthopedics;  Laterality: Right;  . HARDWARE REMOVAL Right 12/25/2014   Procedure: RIGHT ANKLE REMOVE OF DEEP IMPLANT ANE EXTERNAL FIXATOR ;  Surgeon: [name], MD;  Location: [facility];  Service: Orthopedics;  Laterality: Right;  . ORIF ANKLE FRACTURE Right 12/25/2014   Procedure: OPEN REDUCTION INTERNAL FIXATION (ORIF)TRIMALLEOLAR  ANKLE FRACTURE;  Surgeon: [name], MD;  Location: [facility];  Service: Orthopedics;  Laterality: Right;  . ORIF WRIST FRACTURE Left 2010     Dr. Burney Gauze.  Marland Kitchen POLYPECTOMY    . ROTATOR CUFF REPAIR Right 2006  . TONSILLECTOMY    . TONSILLECTOMY    . TOTAL KNEE ARTHROPLASTY Right 08/2010    Allergies  Allergen Reactions  . Albuterol Sulfate Palpitations  . Levaquin [Levofloxacin In D5w] Other (See Comments)    QT prolongation. Should avoid all flouroquinolones.   . Lactose Intolerance (Gi) Other (See Comments)  . Amoxicillin Other (See Comments)    REACTION: unspecified  . Codeine Other (See Comments)    Can take Hydrocodone  . Penicillins Hives, Itching and Rash    Hard lump and rash Has  patient had a PCN reaction causing immediate rash, facial/tongue/throat swelling, SOB or lightheadedness with hypotension: Yes Has patient had a PCN reaction causing severe rash involving mucus membranes or skin necrosis: No Has patient had a PCN reaction that required hospitalization unknown Has patient had a PCN reaction occurring within the last 10 years: No If all of the above answers are "NO", then may proceed with Cephalosporin use.  . Sulfa Antibiotics Nausea Only  . Sulfonamide Derivatives Nausea Only    Allergies as of 04/14/2017      Reactions   Albuterol Sulfate Palpitations   Levaquin [levofloxacin In D5w] Other (See Comments)   QT prolongation. Should avoid all flouroquinolones.    Doxycycline Diarrhea   Lactose Intolerance (gi) Other (See Comments)   Amoxicillin Other (See Comments)   REACTION: unspecified   Codeine Other (See Comments)   Can take Hydrocodone   Penicillins Hives, Itching, Rash   Hard lump and rash  Has patient had a PCN reaction causing immediate rash, facial/tongue/throat swelling, SOB or lightheadedness with hypotension: Yes Has patient had a PCN reaction causing severe rash involving mucus membranes or skin necrosis: No Has patient had a PCN reaction that required hospitalization unknown Has patient had a PCN reaction occurring within the last 10 years: No If all of the above answers are "NO", then may proceed with Cephalosporin use.   Sulfa Antibiotics Nausea Only   Sulfonamide Derivatives Nausea Only      Medication List       Accurate as of 04/14/17 11:44 AM. Always use your most recent med list.          acetaminophen 500 MG tablet Commonly known as:  TYLENOL Take 1,000 mg by mouth every 6 (six) hours as needed for headache (pain). Not to exceed 3085m daily   anagrelide 1 MG capsule Commonly known as:  AGRYLIN Take 2 tab in the morning, and 1 tab in everning   aspirin EC 81 MG tablet Take 1 tablet (81 mg total) by mouth daily.     bimatoprost 0.01 % Soln Commonly known as:  LUMIGAN Place 1 drop into both eyes at bedtime.   budesonide-formoterol 160-4.5 MCG/ACT inhaler Commonly known as:  SYMBICORT Inhale 2 puffs into the lungs 2 (two) times daily.   clonazePAM 0.5 MG tablet Commonly known as:  KLONOPIN Take 0.5 tablets (0.25 mg total) by mouth 3 (three) times daily as needed (anxiety).   dicyclomine 10 MG capsule Commonly known as:  BENTYL Take 1 capsule (10 mg total) by mouth 3 (three) times daily before meals. IBS   diltiazem 360 MG 24 hr capsule Commonly known as:  CARDIZEM CD Take 1 capsule (360 mg total) by mouth daily.   doxycycline 100 MG EC tablet Commonly known as:  DORYX Take 100 mg by mouth 2 (two) times daily.   feeding supplement (PRO-STAT SUGAR FREE 64) Liqd Take 30 mLs by mouth 2 (two) times daily.   furosemide 20 MG tablet Commonly known as:  LASIX Take 1 tablet (20 mg total) by mouth daily.   iron polysaccharides 150 MG capsule Commonly known as:  NIFEREX Take 150 mg by mouth daily.   levalbuterol 1.25 MG/0.5ML nebulizer solution Commonly known as:  XOPENEX Take 1.25 mg by nebulization every 6 (six) hours as needed for wheezing or shortness of breath.   loperamide 2 MG capsule Commonly known as:  IMODIUM Take 2 mg by mouth as needed for diarrhea or loose stools.   losartan 50 MG tablet Commonly known as:  COZAAR Take 1 tablet (50 mg total) by mouth 2 (two) times daily.   magnesium oxide 400 MG tablet Commonly known as:  MAG-OX Take 400 mg by mouth daily as needed (leg cramps).   metoprolol tartrate 50 MG tablet Commonly known as:  LOPRESSOR Take 1 tablet (50 mg total) by mouth 2 (two) times daily.   multivitamin with minerals Tabs tablet Take 1 tablet by mouth daily.   polyvinyl alcohol 1.4 % ophthalmic solution Commonly known as:  LIQUIFILM TEARS Place 1 drop into both eyes 3 (three) times daily.   saccharomyces boulardii 250 MG capsule Commonly known as:   FLORASTOR Take 250 mg by mouth 2 (two) times daily.   zolpidem 5 MG tablet Commonly known as:  AMBIEN Take 1 tablet (5 mg total) by mouth at bedtime as needed for sleep.       Review of Systems  Constitutional: Positive for activity change and fatigue. Negative  for appetite change, chills, diaphoresis, fever and unexpected weight change.       HOH  HENT: Negative for congestion, rhinorrhea, sneezing, sore throat and trouble swallowing.   Eyes: Negative for visual disturbance.  Respiratory: Positive for cough and shortness of breath. Negative for wheezing.        Has heavy feeling in the chest at times and more short of breath with exertion   Cardiovascular: Negative for chest pain, palpitations and leg swelling.  Gastrointestinal: Positive for diarrhea. Negative for abdominal distention and constipation.  Genitourinary: Negative for difficulty urinating and dysuria.  Musculoskeletal: Positive for gait problem. Negative for arthralgias and back pain.  Skin: Positive for wound. Negative for rash.  Neurological: Positive for weakness. Negative for dizziness, tremors, seizures, syncope, facial asymmetry, speech difficulty, light-headedness, numbness and headaches.  Psychiatric/Behavioral: Negative for agitation, behavioral problems and confusion.    Immunization History  Administered Date(s) Administered  . H1N1 09/30/2008  . Influenza Split 05/30/2012, 07/01/2013  . Influenza Whole 06/19/2009, 06/20/2011  . Influenza, High Dose Seasonal PF 07/01/2013  . Influenza,inj,Quad PF,36+ Mos 06/05/2014  . Pneumococcal Conjugate-13 08/20/2013  . Pneumococcal Polysaccharide-23 11/18/1992, 08/25/2010  . Tetanus 08/20/2013  . Zoster 09/19/2004   Pertinent  Health Maintenance Due  Topic Date Due  . DEXA SCAN  03/15/1993  . INFLUENZA VACCINE  04/19/2017  . PNA vac Low Risk Adult  Completed   Fall Risk  06/06/2014  Falls in the past year? No   Functional Status Survey:    Vitals:    04/14/17 1137  BP: (!) 143/71  Pulse: 86  Resp: 18  Temp: 98.3 F (36.8 C)  SpO2: 93%   There is no height or weight on file to calculate BMI. Physical Exam  Constitutional: She is oriented to person, place, and time. No distress.  HENT:  Head: Normocephalic and atraumatic.  Very HOH  Eyes: Pupils are equal, round, and reactive to light. Conjunctivae are normal. Right eye exhibits no discharge.  Neck: No JVD present.  Cardiovascular: Normal rate and regular rhythm.   No murmur heard. Pulmonary/Chest: Effort normal. No respiratory distress. She has no wheezes.  Decreased both bases, worse on the right  Abdominal: Soft. Bowel sounds are normal. She exhibits no distension. There is no tenderness.  Musculoskeletal: She exhibits no edema or tenderness.  Lymphadenopathy:    She has no cervical adenopathy.  Neurological: She is alert and oriented to person, place, and time.  Skin: Skin is warm and dry. She is not diaphoretic.  Psychiatric: She has a normal mood and affect.  Nursing note and vitals reviewed.   Labs reviewed:  Recent Labs  12/25/16 1550  03/02/17 1504  03/05/17 0326 03/06/17 0226 03/07/17 0333 03/14/17 1238 04/06/17  NA 138  < > 138  < > 137 133* 135 140 138  K 3.7  < > 3.3*  < > 3.2* 3.3* 3.7 4.0 4.5  CL 106  < > 108  < > 101 98* 101  --   --   CO2 23  < > 23  < > _0 --   GLUCOSE 124*  < > 126*  < > 80 94 84 126  --   BUN 23*  < > 24*  < > _1 19.6 16  CREATININE 0.76  0.77  < > 0.81  < > 0.74 0.67 0.67 0.8 0.8  CALCIUM 9.1  < > 8.4*  < > 7.9* 7.8* 7.9* 9.2  --  MG 2.1  --  2.0  --   --   --   --   --   --   < > = values in this interval not displayed.  Recent Labs  03/06/17 0226 03/07/17 0333 03/14/17 1238 04/06/17 04/10/17  AST 14* 17 12 54* 67*  ALT '15 17 15 '$ 171* 133*  ALKPHOS 48 52 67 316* 379*  BILITOT 0.8 0.8 0.47  --   --   PROT 4.8* 5.0* 6.2*  --   --   ALBUMIN 2.7* 2.8* 3.2*  --   --     Recent Labs  03/02/17 1504   03/07/17 0333 03/14/17 1238 03/30/17 1452 04/10/17  WBC 5.9  < > 4.4 3.1* 1.8* 2.5  NEUTROABS 3.8  --   --  2.1 1.1*  --   HGB 9.8*  < > 8.1* 8.9* 9.0* 7.7*  HCT 31.5*  < > 26.8* 28.6* 28.1* 23*  MCV 96.6  < > 97.5 96.6 94.0  --   PLT 292  < > 338 352 276 356  < > = values in this interval not displayed. Lab Results  Component Value Date   TSH 2.712 03/02/2017   Lab Results  Component Value Date   HGBA1C (H) 10/04/2010    5.7 (NOTE)                                                                       According to the ADA Clinical Practice Recommendations for 2011, when HbA1c is used as a screening test:   >=6.5%   Diagnostic of Diabetes Mellitus           (if abnormal result  is confirmed)  5.7-6.4%   Increased risk of developing Diabetes Mellitus  References:Diagnosis and Classification of Diabetes Mellitus,Diabetes AJOI,7867,67(MCNOB 1):S62-S69 and Standards of Medical Care in         Diabetes - 2011,Diabetes SJGG,8366,29  (Suppl 1):S11-S61.   Lab Results  Component Value Date   CHOL 247 (H) 06/05/2014   HDL 75.60 06/05/2014   LDLCALC 156 (H) 06/05/2014   LDLDIRECT 157.6 01/16/2012   TRIG 78.0 06/05/2014   CHOLHDL 3 06/05/2014    Significant Diagnostic Results in last 30 days:  Dg Chest Port 1 View  Result Date: 03/02/2017 CLINICAL DATA:  Shortness of breath tachycardia and productive cough. EXAM: PORTABLE CHEST 1 VIEW COMPARISON:  February 22, 2017. FINDINGS: The heart size and mediastinal contours are stable. The heart size is mildly enlarged. There is minimal increased pulmonary interstitium. There is no focal pneumonia or pleural effusion. The visualized skeletal structures are stable. IMPRESSION: Mild cardiomegaly. Minimal increased pulmonary interstitium unchanged. Electronically Signed   By: Abelardo Diesel M.D.   On: 03/02/2017 15:23    Assessment/Plan  1. Transaminitis With GGT elevation, confirming liver source ?biliary obstruction due to stone We discussed that she  is not a good candidate for aggressive care given her age and other multiple health problems. She also does not wish to have her life prolonged and has accepted hospice.  I discussed her care with Dr. Mariea Clonts and also with her PCP Dr. Sharlett Iles. We all agreed not to pursue this issue further due to her age, debility, and lack of symptoms.   2.  Lobar pneumonia (HCC) Improved cough, needs to complete Doxycycline CXR shows resolved right infiltrate, now small infiltrate on the left. Will not change treatment at this point.   3. Pleural effusion Small bilateral effusion, not enough to drain. May be due to CHF as she does have an elevated BNP Give one dose of Lasix 40 mg with Kdur 20 meq  4. MDS (myelodysplastic syndrome) ([place]) Ms. Dehaven has decided that she may want to continue care with Dr. Burr Medico and received treatment for MDS with Aranesp and Agrylin. She would like to sort out the other issues she is having first and then make her final decision. We are more than happy to set up an apt with Dr. Burr Medico for her injection or give it to her here at [facility] if she chooses to do so.     I spent 1 hour with Ms. Regina and her husband. They are very hard of hearing and wanted me to coordinate with Dr. Sharlett Iles which I did. I spen >50% of this visit in counseling and coordination.  Family/ staff Communication: discussed with resident Mr Croll  Labs/tests ordered: CBC and LFTs next week

## 2017-04-17 DIAGNOSIS — I4891 Unspecified atrial fibrillation: Secondary | ICD-10-CM | POA: Diagnosis not present

## 2017-04-17 DIAGNOSIS — J189 Pneumonia, unspecified organism: Secondary | ICD-10-CM | POA: Diagnosis not present

## 2017-04-17 DIAGNOSIS — D469 Myelodysplastic syndrome, unspecified: Secondary | ICD-10-CM | POA: Diagnosis not present

## 2017-04-17 DIAGNOSIS — J449 Chronic obstructive pulmonary disease, unspecified: Secondary | ICD-10-CM | POA: Diagnosis not present

## 2017-04-17 DIAGNOSIS — I5030 Unspecified diastolic (congestive) heart failure: Secondary | ICD-10-CM | POA: Diagnosis not present

## 2017-04-17 DIAGNOSIS — D649 Anemia, unspecified: Secondary | ICD-10-CM | POA: Diagnosis not present

## 2017-04-17 DIAGNOSIS — Z79899 Other long term (current) drug therapy: Secondary | ICD-10-CM | POA: Diagnosis not present

## 2017-04-17 DIAGNOSIS — I10 Essential (primary) hypertension: Secondary | ICD-10-CM | POA: Diagnosis not present

## 2017-04-17 DIAGNOSIS — I25810 Atherosclerosis of coronary artery bypass graft(s) without angina pectoris: Secondary | ICD-10-CM | POA: Diagnosis not present

## 2017-04-17 LAB — HEPATIC FUNCTION PANEL
ALK PHOS: 178 — AB (ref 25–125)
ALT: 25 (ref 7–35)
ALT: 25 (ref 7–35)
AST: 15 (ref 13–35)
AST: 15 (ref 13–35)
Alkaline Phosphatase: 178 — AB (ref 25–125)
BILIRUBIN DIRECT: 0.2 (ref 0.01–0.4)
Bilirubin, Total: 0.5
Bilirubin, Total: 0.5

## 2017-04-17 LAB — CBC AND DIFFERENTIAL
HCT: 22 — AB (ref 36–46)
Hemoglobin: 7.5 — AB (ref 12.0–16.0)
Platelets: 421 — AB (ref 150–399)
WBC: 5.5

## 2017-04-18 ENCOUNTER — Encounter: Payer: Self-pay | Admitting: Internal Medicine

## 2017-04-18 DIAGNOSIS — I25810 Atherosclerosis of coronary artery bypass graft(s) without angina pectoris: Secondary | ICD-10-CM | POA: Diagnosis not present

## 2017-04-18 DIAGNOSIS — D469 Myelodysplastic syndrome, unspecified: Secondary | ICD-10-CM | POA: Diagnosis not present

## 2017-04-18 DIAGNOSIS — J449 Chronic obstructive pulmonary disease, unspecified: Secondary | ICD-10-CM | POA: Diagnosis not present

## 2017-04-18 DIAGNOSIS — I5030 Unspecified diastolic (congestive) heart failure: Secondary | ICD-10-CM | POA: Diagnosis not present

## 2017-04-18 DIAGNOSIS — I10 Essential (primary) hypertension: Secondary | ICD-10-CM | POA: Diagnosis not present

## 2017-04-18 DIAGNOSIS — I4891 Unspecified atrial fibrillation: Secondary | ICD-10-CM | POA: Diagnosis not present

## 2017-04-19 DIAGNOSIS — J918 Pleural effusion in other conditions classified elsewhere: Secondary | ICD-10-CM | POA: Diagnosis not present

## 2017-04-19 DIAGNOSIS — D630 Anemia in neoplastic disease: Secondary | ICD-10-CM | POA: Diagnosis not present

## 2017-04-19 DIAGNOSIS — I5030 Unspecified diastolic (congestive) heart failure: Secondary | ICD-10-CM | POA: Diagnosis not present

## 2017-04-19 DIAGNOSIS — J449 Chronic obstructive pulmonary disease, unspecified: Secondary | ICD-10-CM | POA: Diagnosis not present

## 2017-04-19 DIAGNOSIS — I25810 Atherosclerosis of coronary artery bypass graft(s) without angina pectoris: Secondary | ICD-10-CM | POA: Diagnosis not present

## 2017-04-19 DIAGNOSIS — D473 Essential (hemorrhagic) thrombocythemia: Secondary | ICD-10-CM | POA: Diagnosis not present

## 2017-04-19 DIAGNOSIS — H11149 Conjunctival xerosis, unspecified, unspecified eye: Secondary | ICD-10-CM | POA: Diagnosis not present

## 2017-04-19 DIAGNOSIS — I10 Essential (primary) hypertension: Secondary | ICD-10-CM | POA: Diagnosis not present

## 2017-04-19 DIAGNOSIS — I4891 Unspecified atrial fibrillation: Secondary | ICD-10-CM | POA: Diagnosis not present

## 2017-04-19 DIAGNOSIS — D72818 Other decreased white blood cell count: Secondary | ICD-10-CM | POA: Diagnosis not present

## 2017-04-19 DIAGNOSIS — D469 Myelodysplastic syndrome, unspecified: Secondary | ICD-10-CM | POA: Diagnosis not present

## 2017-04-19 DIAGNOSIS — K831 Obstruction of bile duct: Secondary | ICD-10-CM | POA: Diagnosis not present

## 2017-04-19 DIAGNOSIS — K589 Irritable bowel syndrome without diarrhea: Secondary | ICD-10-CM | POA: Diagnosis not present

## 2017-04-19 DIAGNOSIS — R54 Age-related physical debility: Secondary | ICD-10-CM | POA: Diagnosis not present

## 2017-04-19 DIAGNOSIS — R0902 Hypoxemia: Secondary | ICD-10-CM | POA: Diagnosis not present

## 2017-04-24 ENCOUNTER — Non-Acute Institutional Stay (SKILLED_NURSING_FACILITY): Payer: Medicare Other | Admitting: Adult Health

## 2017-04-24 ENCOUNTER — Encounter: Payer: Self-pay | Admitting: Adult Health

## 2017-04-24 DIAGNOSIS — J9611 Chronic respiratory failure with hypoxia: Secondary | ICD-10-CM | POA: Diagnosis not present

## 2017-04-24 DIAGNOSIS — I5032 Chronic diastolic (congestive) heart failure: Secondary | ICD-10-CM

## 2017-04-24 DIAGNOSIS — H9201 Otalgia, right ear: Secondary | ICD-10-CM | POA: Diagnosis not present

## 2017-04-24 DIAGNOSIS — J449 Chronic obstructive pulmonary disease, unspecified: Secondary | ICD-10-CM

## 2017-04-24 NOTE — Progress Notes (Signed)
Location:  Occupational psychologist of Service:  SNF (31) Provider:   Cindi Carbon, ANP [place] [phone]  Gayland Curry, DO  Patient Care Team: Gayland Curry, DO as PCP - General (Geriatric Medicine) Brand Males, MD as Consulting Physician (Pulmonary Disease) [name], MD as Consulting Physician (Orthopedic Surgery)  Extended Emergency Contact Information Primary Emergency Contact: Killian,Tom Address: [address]          [place], [place] [address] [name] of [place] Phone: [phone] Mobile Phone: [phone] Relation: Spouse Secondary Emergency Contact: Hendrix,[name]  [place] Mobile Phone: [phone] Relation: Daughter  Code Status:  DNR Goals of care: Advanced Directive information Advanced Directives 04/11/2017  Does Patient Have a Medical Advance Directive? Yes  Type of Paramedic of [place];Out of facility DNR (pink MOST or yellow form)  Does patient want to make changes to medical advance directive? -  Copy of [place] in Chart? Yes  Would patient like information on creating a medical advance directive? -  Pre-existing out of facility DNR order (yellow form or pink MOST form) Yellow form placed in chart (order not valid for inpatient use);Pink MOST form placed in chart (order not valid for inpatient use)     Chief Complaint  Patient presents with  . Acute Visit    right ear pain, SOB    HPI:  Pt is a 83 y.o. female seen for  For complaints of right ear pain and shortness of breath. She is followed by hospice for MDS.  Ear pain: Reported on 8/3, no signs of active infection.  Otic analgesic ordered but changed to hydrocortisone/acetic acid by pharmacy due to drug availability. She has been using it for 3 days and still reports some pain behind her ear but no fever or drainage.   SOB: Hx of COPD July 18, '13 - PFTs: mod-severe  obstructive airway disease, severe diffusion defect - emphysema Has chronic DOE and has been walking shorter distances over time. Now oxygen dependent 2L Also has a hx of Diastolic CHF and takes lasix 20 mg daily. ECHO 12/26/16 EF 60-65%  A CXR on 7/23 showed right lower lobe infiltrate and small right effusion. She is being treated with 7 days of Doxycycline. A follow up CXR 04/14/17 was ordered which showed bilateral small effusions, resolved right base infiltrate and new mild left base infiltrate. She was treated with Lasix 40 mg x 1 dose.  Her symptoms of shortness of breath with exertion have not changed. She forgets to wear oxygen and then gets very short of breath, the anxious causing more shortness of breath (cycle).  She has minimal sputum production or cough, no fever.  She is off symbicort now due to improper technique and hospice will not pay for it.   Has used oxycodone 3 x in the past two weeks for right ear pain and sob.  Xopenex used 3 x in the past two weeks  Wt Readings from Last 3 Encounters:  04/24/17 125 lb 3.2 oz (56.8 kg)  04/11/17 125 lb (56.7 kg)  03/30/17 128 lb 1.6 oz (58.1 kg)    Past Medical History:  Diagnosis Date  . ALLERGIC RHINITIS   . Anxiety   . Asthma   . Atrial fibrillation ([place])   . Back pain of thoracolumbar region    right  . Bronchiectasis   . Colles' fracture of left radius   . COPD (chronic obstructive pulmonary  disease) ([place])   . Coronary artery disease   . Diastolic dysfunction   . Diverticulosis   . Dysrhythmia   . History of pneumonia   . Hx of cardiovascular stress test    a. ETT-MV 1/14: Exercised 4:16, no ECG changes, poor ex tol, ant defect c/w soft tissue atten, no ischemia, EF 75%  . Hx of colonic polyp   . Hypertension   . IBS (irritable bowel syndrome)   . Insomnia   . Multiple rib fractures 10/2008   bilateral  . Pleural effusion, left   . Pruritus   . Pulmonary nodule   . Syncope    Past Surgical History:  Procedure  Laterality Date  . ANKLE CLOSED REDUCTION Right 12/11/2014   Procedure: CLOSED REDUCTION ANKLE;  Surgeon: Rod Can, MD;  Location: [facility];  Service: Orthopedics;  Laterality: Right;  . APPENDECTOMY    . BLADDER SUSPENSION  2005   A-P  . CHOLECYSTECTOMY    . COLONOSCOPY  04/06/2012   Procedure: COLONOSCOPY;  Surgeon: Jerene Bears, MD;  Location: [facility];  Service: Gastroenterology;  Laterality: N/A;  . EXTERNAL FIXATION LEG Right 12/12/2014   Procedure: ADJUSTMENT OF EXTERNAL FIXATION  RIGHT ANKLE;  Surgeon: Rod Can, MD;  Location: [facility];  Service: Orthopedics;  Laterality: Right;  . EXTERNAL FIXATION LEG Right 12/11/2014   Procedure: POSSIBLE EXTERNAL FIXATION RIGHT ANKLE;  Surgeon: Rod Can, MD;  Location: [facility];  Service: Orthopedics;  Laterality: Right;  . HARDWARE REMOVAL Right 12/25/2014   Procedure: RIGHT ANKLE REMOVE OF DEEP IMPLANT ANE EXTERNAL FIXATOR ;  Surgeon: [name], MD;  Location: [facility];  Service: Orthopedics;  Laterality: Right;  . ORIF ANKLE FRACTURE Right 12/25/2014   Procedure: OPEN REDUCTION INTERNAL FIXATION (ORIF)TRIMALLEOLAR  ANKLE FRACTURE;  Surgeon: [name], MD;  Location: [facility];  Service: Orthopedics;  Laterality: Right;  . ORIF WRIST FRACTURE Left 2010     Dr. Burney Gauze.  Marland Kitchen POLYPECTOMY    . ROTATOR CUFF REPAIR Right 2006  . TONSILLECTOMY    . TONSILLECTOMY    . TOTAL KNEE ARTHROPLASTY Right 08/2010    Allergies  Allergen Reactions  . Albuterol Sulfate Palpitations  . Levaquin [Levofloxacin In D5w] Other (See Comments)    QT prolongation. Should avoid all flouroquinolones.   . Lactose Intolerance (Gi) Other (See Comments)  . Amoxicillin Other (See Comments)    REACTION: unspecified  . Codeine Other (See Comments)    Can take Hydrocodone  . Penicillins Hives, Itching and Rash    Hard lump and rash Has patient had a PCN reaction causing immediate rash, facial/tongue/throat swelling, SOB or  lightheadedness with hypotension: Yes Has patient had a PCN reaction causing severe rash involving mucus membranes or skin necrosis: No Has patient had a PCN reaction that required hospitalization unknown Has patient had a PCN reaction occurring within the last 10 years: No If all of the above answers are "NO", then may proceed with Cephalosporin use.  . Sulfa Antibiotics Nausea Only  . Sulfonamide Derivatives Nausea Only    Allergies as of 04/24/2017      Reactions   Albuterol Sulfate Palpitations   Levaquin [levofloxacin In D5w] Other (See Comments)   QT prolongation. Should avoid all flouroquinolones.    Lactose Intolerance (gi) Other (See Comments)   Amoxicillin Other (See Comments)   REACTION: unspecified   Codeine Other (See Comments)   Can take Hydrocodone   Penicillins Hives, Itching, Rash   Hard lump  and rash Has patient had a PCN reaction causing immediate rash, facial/tongue/throat swelling, SOB or lightheadedness with hypotension: Yes Has patient had a PCN reaction causing severe rash involving mucus membranes or skin necrosis: No Has patient had a PCN reaction that required hospitalization unknown Has patient had a PCN reaction occurring within the last 10 years: No If all of the above answers are "NO", then may proceed with Cephalosporin use.   Sulfa Antibiotics Nausea Only   Sulfonamide Derivatives Nausea Only      Medication List       Accurate as of 04/24/17 10:10 AM. Always use your most recent med list.          acetaminophen 500 MG tablet Commonly known as:  TYLENOL Take 1,000 mg by mouth every 6 (six) hours as needed for headache (pain). Not to exceed 3000mg  daily   anagrelide 1 MG capsule Commonly known as:  AGRYLIN Take 2 tab in the morning, and 1 tab in everning   aspirin EC 81 MG tablet Take 1 tablet (81 mg total) by mouth daily.   bimatoprost 0.01 % Soln Commonly known as:  LUMIGAN Place 1 drop into both eyes at bedtime.     budesonide-formoterol 160-4.5 MCG/ACT inhaler Commonly known as:  SYMBICORT Inhale 2 puffs into the lungs 2 (two) times daily.   clonazePAM 0.5 MG tablet Commonly known as:  KLONOPIN Take 0.5 tablets (0.25 mg total) by mouth 3 (three) times daily as needed (anxiety).   dicyclomine 10 MG capsule Commonly known as:  BENTYL Take 1 capsule (10 mg total) by mouth 3 (three) times daily before meals. IBS   diltiazem 360 MG 24 hr capsule Commonly known as:  CARDIZEM CD Take 1 capsule (360 mg total) by mouth daily.   doxycycline 100 MG EC tablet Commonly known as:  DORYX Take 100 mg by mouth 2 (two) times daily.   feeding supplement (PRO-STAT SUGAR FREE 64) Liqd Take 30 mLs by mouth 2 (two) times daily.   furosemide 20 MG tablet Commonly known as:  LASIX Take 1 tablet (20 mg total) by mouth daily.   iron polysaccharides 150 MG capsule Commonly known as:  NIFEREX Take 150 mg by mouth daily.   levalbuterol 1.25 MG/0.5ML nebulizer solution Commonly known as:  XOPENEX Take 1.25 mg by nebulization every 6 (six) hours as needed for wheezing or shortness of breath.   loperamide 2 MG capsule Commonly known as:  IMODIUM Take 2 mg by mouth as needed for diarrhea or loose stools.   losartan 50 MG tablet Commonly known as:  COZAAR Take 1 tablet (50 mg total) by mouth 2 (two) times daily.   magnesium oxide 400 MG tablet Commonly known as:  MAG-OX Take 400 mg by mouth daily as needed (leg cramps).   metoprolol tartrate 50 MG tablet Commonly known as:  LOPRESSOR Take 1 tablet (50 mg total) by mouth 2 (two) times daily.   multivitamin with minerals Tabs tablet Take 1 tablet by mouth daily.   oxycodone 5 MG capsule Commonly known as:  OXY-IR Take 5 mg by mouth every 4 (four) hours as needed.   polyvinyl alcohol 1.4 % ophthalmic solution Commonly known as:  LIQUIFILM TEARS Place 1 drop into both eyes 3 (three) times daily.   saccharomyces boulardii 250 MG capsule Commonly known  as:  FLORASTOR Take 250 mg by mouth 2 (two) times daily.   zolpidem 5 MG tablet Commonly known as:  AMBIEN Take 1 tablet (5 mg total) by mouth  at bedtime as needed for sleep.       Review of Systems  Constitutional: Positive for activity change and fatigue. Negative for appetite change, chills, diaphoresis, fever and unexpected weight change.       HOH  HENT: Positive for ear pain and hearing loss. Negative for congestion, ear discharge, facial swelling, rhinorrhea, sneezing, sore throat and trouble swallowing.        HOH  Eyes: Negative for visual disturbance.  Respiratory: Positive for shortness of breath (with exertion). Negative for cough and wheezing.   Cardiovascular: Negative for chest pain, palpitations and leg swelling.  Gastrointestinal: Positive for diarrhea. Negative for abdominal distention and constipation.  Genitourinary: Negative for difficulty urinating and dysuria.  Musculoskeletal: Positive for gait problem. Negative for arthralgias and back pain.  Skin: Positive for wound. Negative for rash.  Neurological: Positive for weakness. Negative for dizziness, tremors, seizures, syncope, facial asymmetry, speech difficulty, light-headedness, numbness and headaches.  Psychiatric/Behavioral: Negative for agitation, behavioral problems and confusion.    Immunization History  Administered Date(s) Administered  . H1N1 09/30/2008  . Influenza Split 05/30/2012, 07/01/2013  . Influenza Whole 06/19/2009, 06/20/2011  . Influenza, High Dose Seasonal PF 07/01/2013  . Influenza,inj,Quad PF,36+ Mos 06/05/2014  . Pneumococcal Conjugate-13 08/20/2013  . Pneumococcal Polysaccharide-23 11/18/1992, 08/25/2010  . Tetanus 08/20/2013  . Zoster 09/19/2004   Pertinent  Health Maintenance Due  Topic Date Due  . DEXA SCAN  03/15/1993  . INFLUENZA VACCINE  04/19/2017  . PNA vac Low Risk Adult  Completed   Fall Risk  06/06/2014  Falls in the past year? No   Functional Status Survey:      Vitals:   04/24/17 1008  BP: (!) 141/69  Pulse: 80  Resp: (!) 22  Temp: (!) 97.2 F (36.2 C)  SpO2: 97%  Weight: 125 lb 3.2 oz (56.8 kg)   Body mass index is 20.83 kg/m. Physical Exam  Constitutional: She is oriented to person, place, and time. No distress.  HENT:  Head: Normocephalic and atraumatic.  Right Ear: Tympanic membrane, external ear and ear canal normal. Decreased hearing is noted.  Left Ear: Tympanic membrane, external ear and ear canal normal. Decreased hearing is noted.  Mouth/Throat: No oropharyngeal exudate.  Very HOH  Eyes: Pupils are equal, round, and reactive to light. Conjunctivae are normal. Right eye exhibits no discharge.  Neck: No JVD present.  Cardiovascular: Normal rate and regular rhythm.   No murmur heard. Pulmonary/Chest: Effort normal. No respiratory distress. She has no wheezes.  Decreased both bases, worse on the right  Abdominal: Soft. Bowel sounds are normal. She exhibits no distension. There is no tenderness.  Musculoskeletal: She exhibits no edema or tenderness.  Lymphadenopathy:    She has no cervical adenopathy.  Neurological: She is alert and oriented to person, place, and time.  Skin: Skin is warm and dry. She is not diaphoretic.  Psychiatric: She has a normal mood and affect.  Nursing note and vitals reviewed.   Labs reviewed:  Recent Labs  12/25/16 1550  03/02/17 1504  03/05/17 0326 03/06/17 0226 03/07/17 0333 03/14/17 1238 04/06/17  NA 138  < > 138  < > 137 133* 135 140 138  K 3.7  < > 3.3*  < > 3.2* 3.3* 3.7 4.0 4.5  CL 106  < > 108  < > 101 98* 101  --   --   CO2 23  < > 23  < > 29 27 25 23   --   GLUCOSE 124*  < >  126*  < > 80 94 84 126  --   BUN 23*  < > 24*  < > 9 15 18  19.6 16  CREATININE 0.76  0.77  < > 0.81  < > 0.74 0.67 0.67 0.8 0.8  CALCIUM 9.1  < > 8.4*  < > 7.9* 7.8* 7.9* 9.2  --   MG 2.1  --  2.0  --   --   --   --   --   --   < > = values in this interval not displayed.  Recent Labs  03/06/17 0226  03/07/17 0333 03/14/17 1238  04/12/17 0700 04/17/17 04/17/17 0120  AST 14* 17 12  < > 32 15 15  ALT 15 17 15   < > 85* 25 25  ALKPHOS 48 52 67  < > 333* 178* 178*  BILITOT 0.8 0.8 0.47  --   --   --   --   PROT 4.8* 5.0* 6.2*  --   --   --   --   ALBUMIN 2.7* 2.8* 3.2*  --   --   --   --   < > = values in this interval not displayed.  Recent Labs  03/02/17 1504  03/07/17 0333 03/14/17 1238 03/30/17 1452 04/10/17 04/10/17 0600 04/12/17 0700 04/17/17 0120  WBC 5.9  < > 4.4 3.1* 1.8* 2.5 2.5 2.2 5.5  NEUTROABS 3.8  --   --  2.1 1.1*  --   --   --   --   HGB 9.8*  < > 8.1* 8.9* 9.0* 7.7* 7.7* 7.9* 7.5*  HCT 31.5*  < > 26.8* 28.6* 28.1* 23* 23* 25* 22*  MCV 96.6  < > 97.5 96.6 94.0  --   --   --   --   PLT 292  < > 338 352 276 356  --  376 421*  < > = values in this interval not displayed. Lab Results  Component Value Date   TSH 2.712 03/02/2017   Lab Results  Component Value Date   HGBA1C (H) 10/04/2010    5.7 (NOTE)                                                                       According to the ADA Clinical Practice Recommendations for 2011, when HbA1c is used as a screening test:   >=6.5%   Diagnostic of Diabetes Mellitus           (if abnormal result  is confirmed)  5.7-6.4%   Increased risk of developing Diabetes Mellitus  References:Diagnosis and Classification of Diabetes Mellitus,Diabetes WLNL,8921,19(ERDEY 1):S62-S69 and Standards of Medical Care in         Diabetes - 2011,Diabetes CXKG,8185,63  (Suppl 1):S11-S61.   Lab Results  Component Value Date   CHOL 247 (H) 06/05/2014   HDL 75.60 06/05/2014   LDLCALC 156 (H) 06/05/2014   LDLDIRECT 157.6 01/16/2012   TRIG 78.0 06/05/2014   CHOLHDL 3 06/05/2014    Significant Diagnostic Results in last 30 days:  Dg Chest Port 1 View  Result Date: 03/02/2017 CLINICAL DATA:  Shortness of breath tachycardia and productive cough. EXAM: PORTABLE CHEST 1 VIEW COMPARISON:  February 22, 2017. FINDINGS: The heart size and  mediastinal contours are stable. The heart size is mildly enlarged. There is minimal increased pulmonary interstitium. There is no focal pneumonia or pleural effusion. The visualized skeletal structures are stable. IMPRESSION: Mild cardiomegaly. Minimal increased pulmonary interstitium unchanged. Electronically Signed   By: Abelardo Diesel M.D.   On: 03/02/2017 15:23    Assessment/Plan  1) Right ear otalgia No signs of infection, improvement noted in pain Continue Hydrocortisone/Acetic acid QID to right ear to complete 5 days of therapy (2 more days)  2) COPD Her symptoms are most likely due to not wearing oxygen, COPD, and anxiety. Also has anemia which may contribute and is no longer going out for aranesp injection and/followed by hospice.  Xopenex 0.63 mg BID scheduled Atrovent 0.5 mg BID scheduled If no improvement will add pulmicort as well  3) Chronic respiratory failure Oxygen dependent 2L and should wear at all times  4) Diastolic CHF Weight trending down but need more frequent numbers Weekly weights Continue lasix 20 mg qd   Family/ staff Communication: discussed with resident Mr Corti  Labs/tests ordered: CBC in 1 week

## 2017-04-26 DIAGNOSIS — I10 Essential (primary) hypertension: Secondary | ICD-10-CM | POA: Diagnosis not present

## 2017-04-26 DIAGNOSIS — I4891 Unspecified atrial fibrillation: Secondary | ICD-10-CM | POA: Diagnosis not present

## 2017-04-26 DIAGNOSIS — J449 Chronic obstructive pulmonary disease, unspecified: Secondary | ICD-10-CM | POA: Diagnosis not present

## 2017-04-26 DIAGNOSIS — I25810 Atherosclerosis of coronary artery bypass graft(s) without angina pectoris: Secondary | ICD-10-CM | POA: Diagnosis not present

## 2017-04-26 DIAGNOSIS — I5030 Unspecified diastolic (congestive) heart failure: Secondary | ICD-10-CM | POA: Diagnosis not present

## 2017-04-26 DIAGNOSIS — D469 Myelodysplastic syndrome, unspecified: Secondary | ICD-10-CM | POA: Diagnosis not present

## 2017-04-27 ENCOUNTER — Ambulatory Visit: Payer: Medicare Other

## 2017-04-27 ENCOUNTER — Other Ambulatory Visit: Payer: Medicare Other

## 2017-05-01 DIAGNOSIS — I25810 Atherosclerosis of coronary artery bypass graft(s) without angina pectoris: Secondary | ICD-10-CM | POA: Diagnosis not present

## 2017-05-01 DIAGNOSIS — J449 Chronic obstructive pulmonary disease, unspecified: Secondary | ICD-10-CM | POA: Diagnosis not present

## 2017-05-01 DIAGNOSIS — D464 Refractory anemia, unspecified: Secondary | ICD-10-CM | POA: Diagnosis not present

## 2017-05-01 DIAGNOSIS — I10 Essential (primary) hypertension: Secondary | ICD-10-CM | POA: Diagnosis not present

## 2017-05-01 DIAGNOSIS — I5030 Unspecified diastolic (congestive) heart failure: Secondary | ICD-10-CM | POA: Diagnosis not present

## 2017-05-01 DIAGNOSIS — I4891 Unspecified atrial fibrillation: Secondary | ICD-10-CM | POA: Diagnosis not present

## 2017-05-01 DIAGNOSIS — D469 Myelodysplastic syndrome, unspecified: Secondary | ICD-10-CM | POA: Diagnosis not present

## 2017-05-01 LAB — CBC AND DIFFERENTIAL
HCT: 24 — AB (ref 36–46)
Hemoglobin: 8.3 — AB (ref 12.0–16.0)
PLATELETS: 495 — AB (ref 150–399)
WBC: 1.9

## 2017-05-02 DIAGNOSIS — J449 Chronic obstructive pulmonary disease, unspecified: Secondary | ICD-10-CM | POA: Diagnosis not present

## 2017-05-02 DIAGNOSIS — I4891 Unspecified atrial fibrillation: Secondary | ICD-10-CM | POA: Diagnosis not present

## 2017-05-02 DIAGNOSIS — D469 Myelodysplastic syndrome, unspecified: Secondary | ICD-10-CM | POA: Diagnosis not present

## 2017-05-02 DIAGNOSIS — I5030 Unspecified diastolic (congestive) heart failure: Secondary | ICD-10-CM | POA: Diagnosis not present

## 2017-05-02 DIAGNOSIS — I25810 Atherosclerosis of coronary artery bypass graft(s) without angina pectoris: Secondary | ICD-10-CM | POA: Diagnosis not present

## 2017-05-02 DIAGNOSIS — I10 Essential (primary) hypertension: Secondary | ICD-10-CM | POA: Diagnosis not present

## 2017-05-03 DIAGNOSIS — I5030 Unspecified diastolic (congestive) heart failure: Secondary | ICD-10-CM | POA: Diagnosis not present

## 2017-05-03 DIAGNOSIS — I10 Essential (primary) hypertension: Secondary | ICD-10-CM | POA: Diagnosis not present

## 2017-05-03 DIAGNOSIS — I25810 Atherosclerosis of coronary artery bypass graft(s) without angina pectoris: Secondary | ICD-10-CM | POA: Diagnosis not present

## 2017-05-03 DIAGNOSIS — I4891 Unspecified atrial fibrillation: Secondary | ICD-10-CM | POA: Diagnosis not present

## 2017-05-03 DIAGNOSIS — D469 Myelodysplastic syndrome, unspecified: Secondary | ICD-10-CM | POA: Diagnosis not present

## 2017-05-03 DIAGNOSIS — J449 Chronic obstructive pulmonary disease, unspecified: Secondary | ICD-10-CM | POA: Diagnosis not present

## 2017-05-03 DIAGNOSIS — J45998 Other asthma: Secondary | ICD-10-CM | POA: Diagnosis not present

## 2017-05-05 DIAGNOSIS — I5030 Unspecified diastolic (congestive) heart failure: Secondary | ICD-10-CM | POA: Diagnosis not present

## 2017-05-05 DIAGNOSIS — J449 Chronic obstructive pulmonary disease, unspecified: Secondary | ICD-10-CM | POA: Diagnosis not present

## 2017-05-05 DIAGNOSIS — I10 Essential (primary) hypertension: Secondary | ICD-10-CM | POA: Diagnosis not present

## 2017-05-05 DIAGNOSIS — I25810 Atherosclerosis of coronary artery bypass graft(s) without angina pectoris: Secondary | ICD-10-CM | POA: Diagnosis not present

## 2017-05-05 DIAGNOSIS — D469 Myelodysplastic syndrome, unspecified: Secondary | ICD-10-CM | POA: Diagnosis not present

## 2017-05-05 DIAGNOSIS — I4891 Unspecified atrial fibrillation: Secondary | ICD-10-CM | POA: Diagnosis not present

## 2017-05-06 DIAGNOSIS — J449 Chronic obstructive pulmonary disease, unspecified: Secondary | ICD-10-CM | POA: Diagnosis not present

## 2017-05-06 DIAGNOSIS — I25810 Atherosclerosis of coronary artery bypass graft(s) without angina pectoris: Secondary | ICD-10-CM | POA: Diagnosis not present

## 2017-05-06 DIAGNOSIS — I10 Essential (primary) hypertension: Secondary | ICD-10-CM | POA: Diagnosis not present

## 2017-05-06 DIAGNOSIS — I4891 Unspecified atrial fibrillation: Secondary | ICD-10-CM | POA: Diagnosis not present

## 2017-05-06 DIAGNOSIS — D469 Myelodysplastic syndrome, unspecified: Secondary | ICD-10-CM | POA: Diagnosis not present

## 2017-05-06 DIAGNOSIS — I5030 Unspecified diastolic (congestive) heart failure: Secondary | ICD-10-CM | POA: Diagnosis not present

## 2017-05-10 DIAGNOSIS — J449 Chronic obstructive pulmonary disease, unspecified: Secondary | ICD-10-CM | POA: Diagnosis not present

## 2017-05-10 DIAGNOSIS — I5030 Unspecified diastolic (congestive) heart failure: Secondary | ICD-10-CM | POA: Diagnosis not present

## 2017-05-10 DIAGNOSIS — I25810 Atherosclerosis of coronary artery bypass graft(s) without angina pectoris: Secondary | ICD-10-CM | POA: Diagnosis not present

## 2017-05-10 DIAGNOSIS — D469 Myelodysplastic syndrome, unspecified: Secondary | ICD-10-CM | POA: Diagnosis not present

## 2017-05-10 DIAGNOSIS — I4891 Unspecified atrial fibrillation: Secondary | ICD-10-CM | POA: Diagnosis not present

## 2017-05-10 DIAGNOSIS — I10 Essential (primary) hypertension: Secondary | ICD-10-CM | POA: Diagnosis not present

## 2017-05-11 ENCOUNTER — Other Ambulatory Visit: Payer: Medicare Other

## 2017-05-11 ENCOUNTER — Ambulatory Visit: Payer: Medicare Other

## 2017-05-11 ENCOUNTER — Telehealth: Payer: Self-pay

## 2017-05-11 NOTE — Telephone Encounter (Signed)
Quality measure schedule visit list, patient lives at [facility] in skill unit. Dr. Mariea Clonts see's her at the nursing home on regular schedule.

## 2017-05-12 DIAGNOSIS — I4891 Unspecified atrial fibrillation: Secondary | ICD-10-CM | POA: Diagnosis not present

## 2017-05-12 DIAGNOSIS — I5030 Unspecified diastolic (congestive) heart failure: Secondary | ICD-10-CM | POA: Diagnosis not present

## 2017-05-12 DIAGNOSIS — D469 Myelodysplastic syndrome, unspecified: Secondary | ICD-10-CM | POA: Diagnosis not present

## 2017-05-12 DIAGNOSIS — I10 Essential (primary) hypertension: Secondary | ICD-10-CM | POA: Diagnosis not present

## 2017-05-12 DIAGNOSIS — J449 Chronic obstructive pulmonary disease, unspecified: Secondary | ICD-10-CM | POA: Diagnosis not present

## 2017-05-12 DIAGNOSIS — I25810 Atherosclerosis of coronary artery bypass graft(s) without angina pectoris: Secondary | ICD-10-CM | POA: Diagnosis not present

## 2017-05-15 DIAGNOSIS — I4891 Unspecified atrial fibrillation: Secondary | ICD-10-CM | POA: Diagnosis not present

## 2017-05-15 DIAGNOSIS — I5030 Unspecified diastolic (congestive) heart failure: Secondary | ICD-10-CM | POA: Diagnosis not present

## 2017-05-15 DIAGNOSIS — I10 Essential (primary) hypertension: Secondary | ICD-10-CM | POA: Diagnosis not present

## 2017-05-15 DIAGNOSIS — I25810 Atherosclerosis of coronary artery bypass graft(s) without angina pectoris: Secondary | ICD-10-CM | POA: Diagnosis not present

## 2017-05-15 DIAGNOSIS — J449 Chronic obstructive pulmonary disease, unspecified: Secondary | ICD-10-CM | POA: Diagnosis not present

## 2017-05-15 DIAGNOSIS — D469 Myelodysplastic syndrome, unspecified: Secondary | ICD-10-CM | POA: Diagnosis not present

## 2017-05-18 ENCOUNTER — Non-Acute Institutional Stay (SKILLED_NURSING_FACILITY): Payer: Medicare Other | Admitting: Adult Health

## 2017-05-18 ENCOUNTER — Encounter: Payer: Self-pay | Admitting: Adult Health

## 2017-05-18 DIAGNOSIS — K580 Irritable bowel syndrome with diarrhea: Secondary | ICD-10-CM

## 2017-05-18 DIAGNOSIS — D469 Myelodysplastic syndrome, unspecified: Secondary | ICD-10-CM | POA: Diagnosis not present

## 2017-05-18 DIAGNOSIS — I5032 Chronic diastolic (congestive) heart failure: Secondary | ICD-10-CM

## 2017-05-18 DIAGNOSIS — I5030 Unspecified diastolic (congestive) heart failure: Secondary | ICD-10-CM | POA: Diagnosis not present

## 2017-05-18 DIAGNOSIS — I471 Supraventricular tachycardia: Secondary | ICD-10-CM | POA: Diagnosis not present

## 2017-05-18 DIAGNOSIS — I10 Essential (primary) hypertension: Secondary | ICD-10-CM | POA: Diagnosis not present

## 2017-05-18 DIAGNOSIS — I25810 Atherosclerosis of coronary artery bypass graft(s) without angina pectoris: Secondary | ICD-10-CM | POA: Diagnosis not present

## 2017-05-18 DIAGNOSIS — J449 Chronic obstructive pulmonary disease, unspecified: Secondary | ICD-10-CM

## 2017-05-18 DIAGNOSIS — I4891 Unspecified atrial fibrillation: Secondary | ICD-10-CM | POA: Diagnosis not present

## 2017-05-18 NOTE — Progress Notes (Signed)
Location:  [facility]up[facility] psychologist of Service:  SNF (31) Provider:   Cindi Carbon, ANP [place] [phone]  Gayland Curry, DO  Patient Care Team: Gayland Curry, DO as PCP - General (Geriatric Medicine) Brand Males, MD as Consulting Physician (Pulmonary Disease) [name], MD as Consulting Physician (Orthopedic Surgery)  Extended Emergency Contact Information Primary Emergency Contact: Pratte,Tom Address: [address]          [place], [place] [address] [name] of [place] Phone: [phone] Mobile Phone: [phone] Relation: Spouse Secondary Emergency Contact: Hendrix,[name]  [place] Mobile Phone: [phone] Relation: Daughter  Code Status:  DNR Goals of care: Advanced Directive information Advanced Directives 05/18/2017  Does Patient Have a Medical Advance Directive? Yes  Type of Advance Directive Out of facility DNR (pink MOST or yellow form);Living will;Healthcare Power of Attorney  Does patient want to make changes to medical advance directive? -  Copy of [place] in Chart? Yes  Would patient like information on creating a medical advance directive? -  Pre-existing out of facility DNR order (yellow form or pink MOST form) Yellow form placed in chart (order not valid for inpatient use);Pink MOST form placed in chart (order not valid for inpatient use)     Chief Complaint  Patient presents with  . Medical Management of Chronic Issues    HPI:  Pt is a 83 y.o. female seen for medical management of chronic diseases. She previously resided in [place] but now called skilled rehab her home. She came to [place] in July after a bout of pneumonia and weakness. She has MDS and multiple other health problems and decided to forego aggressive measures accepted hospice care.  A follow up CXR showed the following below.  04/14/17: showed  bilateral small effusions, resolved right base infiltrate and new mild left base infiltrate. She was given one dose of 40 mg of lasix and no additional antibiotics and she has done well. Lori [name] is dependent on oxygen 2L and at times forgets to wear it which causes significant SOB and anxiety. She denies any pain, increased cough, or sputum production.  On admission in July she was also found to have transaminitis.  A RUQ ultrasound 04/13/17: post op changes with prior cholecystectomy, abnormal biliary ductal dilation raising the possibility of extra hepatic biliary ductal obstruction, right renal cyst. Common hepatic duct dilated 16 mm. We decided not to act on this study after a discussion with her PCP, Dr. Sharlett Iles due to her goals of care. Since then her AST/ALT has decreased to 15/25 and she has been asymptomatic.  Lori [name] also has MDS and has decided to forego additional treatment. She is no longer going to see Dr. Burr Medico and has refused additional aranesp injections. Now that she is followed by hospice they will no longer cover the Agrylin either. She has discussed all of this with hospice and does not wish to pursue further treatment. Her last WBC was 1.9 on 8/13, Hgb 8.3, Plt 495,000.  IBS: she has bouts of excessive bloating and loose stools but states that this has not bothered her lately. She currently takes Bentyl TID.  Tachycardia: has not had any bouts of this while in rehab, thought to be MAT (afib was not definitely diagnosed.  Rate controlled with Cardizem.    Diastolic CHF: has DOE at baseline which as not changed, no edema, no angina. Echo 12/26/16: Ef 60-65%,  Wt Readings from Last  3 Encounters:  05/18/17 124 lb 9.6 oz (56.5 kg)  04/24/17 125 lb 3.2 oz (56.8 kg)  04/11/17 125 lb (56.7 kg)    Past Medical History:  Diagnosis Date  . ALLERGIC RHINITIS   . Anxiety   . Asthma   . Atrial fibrillation ([place])   . Back pain of thoracolumbar region    right  . Bronchiectasis   .  Colles' fracture of left radius   . COPD (chronic obstructive pulmonary disease) ([place])   . Coronary artery disease   . Diastolic dysfunction   . Diverticulosis   . Dysrhythmia   . History of pneumonia   . Hx of cardiovascular stress test    a. ETT-MV 1/14: Exercised 4:16, no ECG changes, poor ex tol, ant defect c/w soft tissue atten, no ischemia, EF 75%  . Hx of colonic polyp   . Hypertension   . IBS (irritable bowel syndrome)   . Insomnia   . Multiple rib fractures 10/2008   bilateral  . Pleural effusion, left   . Pruritus   . Pulmonary nodule   . Syncope    Past Surgical History:  Procedure Laterality Date  . ANKLE CLOSED REDUCTION Right 12/11/2014   Procedure: CLOSED REDUCTION ANKLE;  Surgeon: Rod Can, MD;  Location: [facility];  Service: Orthopedics;  Laterality: Right;  . APPENDECTOMY    . BLADDER SUSPENSION  2005   A-P  . CHOLECYSTECTOMY    . COLONOSCOPY  04/06/2012   Procedure: COLONOSCOPY;  Surgeon: Jerene Bears, MD;  Location: [facility];  Service: Gastroenterology;  Laterality: N/A;  . EXTERNAL FIXATION LEG Right 12/12/2014   Procedure: ADJUSTMENT OF EXTERNAL FIXATION  RIGHT ANKLE;  Surgeon: Rod Can, MD;  Location: [facility];  Service: Orthopedics;  Laterality: Right;  . EXTERNAL FIXATION LEG Right 12/11/2014   Procedure: POSSIBLE EXTERNAL FIXATION RIGHT ANKLE;  Surgeon: Rod Can, MD;  Location: [facility];  Service: Orthopedics;  Laterality: Right;  . HARDWARE REMOVAL Right 12/25/2014   Procedure: RIGHT ANKLE REMOVE OF DEEP IMPLANT ANE EXTERNAL FIXATOR ;  Surgeon: [name], MD;  Location: [facility];  Service: Orthopedics;  Laterality: Right;  . ORIF ANKLE FRACTURE Right 12/25/2014   Procedure: OPEN REDUCTION INTERNAL FIXATION (ORIF)TRIMALLEOLAR  ANKLE FRACTURE;  Surgeon: [name], MD;  Location: [facility];  Service: Orthopedics;  Laterality: Right;  . ORIF WRIST FRACTURE Left 2010     Dr. Burney Gauze.  Marland Kitchen POLYPECTOMY    . ROTATOR  CUFF REPAIR Right 2006  . TONSILLECTOMY    . TONSILLECTOMY    . TOTAL KNEE ARTHROPLASTY Right 08/2010    Allergies  Allergen Reactions  . Albuterol Sulfate Palpitations  . Levaquin [Levofloxacin In D5w] Other (See Comments)    QT prolongation. Should avoid all flouroquinolones.   . Lactose Intolerance (Gi) Other (See Comments)  . Amoxicillin Other (See Comments)    REACTION: unspecified  . Codeine Other (See Comments)    Can take Hydrocodone  . Penicillins Hives, Itching and Rash    Hard lump and rash Has patient had a PCN reaction causing immediate rash, facial/tongue/throat swelling, SOB or lightheadedness with hypotension: Yes Has patient had a PCN reaction causing severe rash involving mucus membranes or skin necrosis: No Has patient had a PCN reaction that required hospitalization unknown Has patient had a PCN reaction occurring within the last 10 years: No If all of the above answers are "NO", then may proceed with Cephalosporin use.  . Sulfa Antibiotics Nausea Only  .  Sulfonamide Derivatives Nausea Only    Allergies as of 05/18/2017      Reactions   Albuterol Sulfate Palpitations   Levaquin [levofloxacin In D5w] Other (See Comments)   QT prolongation. Should avoid all flouroquinolones.    Lactose Intolerance (gi) Other (See Comments)   Amoxicillin Other (See Comments)   REACTION: unspecified   Codeine Other (See Comments)   Can take Hydrocodone   Penicillins Hives, Itching, Rash   Hard lump and rash Has patient had a PCN reaction causing immediate rash, facial/tongue/throat swelling, SOB or lightheadedness with hypotension: Yes Has patient had a PCN reaction causing severe rash involving mucus membranes or skin necrosis: No Has patient had a PCN reaction that required hospitalization unknown Has patient had a PCN reaction occurring within the last 10 years: No If all of the above answers are "NO", then may proceed with Cephalosporin use.   Sulfa Antibiotics Nausea  Only   Sulfonamide Derivatives Nausea Only      Medication List       Accurate as of 05/18/17 10:51 AM. Always use your most recent med list.          acetaminophen 500 MG tablet Commonly known as:  TYLENOL Take 1,000 mg by mouth 2 (two) times daily as needed for headache (pain). Not to exceed 3000mg  daily   anagrelide 1 MG capsule Commonly known as:  AGRYLIN Take 2 tab in the morning, and 1 tab in everning   aspirin EC 81 MG tablet Take 1 tablet (81 mg total) by mouth daily.   bimatoprost 0.01 % Soln Commonly known as:  LUMIGAN Place 1 drop into both eyes at bedtime.   clonazePAM 0.5 MG tablet Commonly known as:  KLONOPIN Take 0.5 tablets (0.25 mg total) by mouth 3 (three) times daily as needed (anxiety).   dicyclomine 10 MG capsule Commonly known as:  BENTYL Take 1 capsule (10 mg total) by mouth 3 (three) times daily before meals. IBS   diltiazem 360 MG 24 hr capsule Commonly known as:  CARDIZEM CD Take 1 capsule (360 mg total) by mouth daily.   feeding supplement (BOOST BREEZE) Liqd Take 1 Can by mouth 2 (two) times daily.   furosemide 20 MG tablet Commonly known as:  LASIX Take 1 tablet (20 mg total) by mouth daily.   ipratropium 0.02 % nebulizer solution Commonly known as:  ATROVENT Take 0.5 mg by nebulization 2 (two) times daily.   iron polysaccharides 150 MG capsule Commonly known as:  NIFEREX Take 150 mg by mouth daily.   levalbuterol 1.25 MG/0.5ML nebulizer solution Commonly known as:  XOPENEX Take 1.25 mg by nebulization every 6 (six) hours as needed for wheezing or shortness of breath.   loperamide 2 MG capsule Commonly known as:  IMODIUM Take 2 mg by mouth as needed for diarrhea or loose stools.   losartan 50 MG tablet Commonly known as:  COZAAR Take 1 tablet (50 mg total) by mouth 2 (two) times daily.   magnesium oxide 400 MG tablet Commonly known as:  MAG-OX Take 400 mg by mouth daily as needed (leg cramps).   metoprolol tartrate 50  MG tablet Commonly known as:  LOPRESSOR Take 1 tablet (50 mg total) by mouth 2 (two) times daily.   multivitamin with minerals Tabs tablet Take 1 tablet by mouth daily.   oxycodone 5 MG capsule Commonly known as:  OXY-IR Take 5 mg by mouth every 4 (four) hours as needed.   polyvinyl alcohol 1.4 % ophthalmic solution Commonly  known as:  LIQUIFILM TEARS Place 1 drop into both eyes 3 (three) times daily.   zolpidem 5 MG tablet Commonly known as:  AMBIEN Take 1 tablet (5 mg total) by mouth at bedtime as needed for sleep.            Discharge Care Instructions        Start     Ordered   05/18/17 0000  CBC and differential    Comments:  This external order was created through the Results Console.    05/18/17 1050      Review of Systems  Constitutional: Positive for fatigue. Negative for activity change, appetite change, chills, diaphoresis, fever and unexpected weight change.       HOH  HENT: Negative for congestion, rhinorrhea, sneezing, sore throat and trouble swallowing.   Eyes: Negative for visual disturbance.  Respiratory: Positive for shortness of breath. Negative for cough and wheezing.        Has heavy feeling in the chest at times and more short of breath with exertion   Cardiovascular: Negative for chest pain, palpitations and leg swelling.  Gastrointestinal: Negative for abdominal distention, constipation and diarrhea.  Genitourinary: Negative for difficulty urinating and dysuria.  Musculoskeletal: Positive for gait problem. Negative for arthralgias and back pain.  Skin: Negative for rash and wound.  Neurological: Positive for weakness. Negative for dizziness, tremors, seizures, syncope, facial asymmetry, speech difficulty, light-headedness, numbness and headaches.  Psychiatric/Behavioral: Negative for agitation, behavioral problems and confusion.    Immunization History  Administered Date(s) Administered  . H1N1 09/30/2008  . Influenza Split 05/30/2012,  07/01/2013  . Influenza Whole 06/19/2009, 06/20/2011  . Influenza, High Dose Seasonal PF 07/01/2013  . Influenza,inj,Quad PF,6+ Mos 06/05/2014  . Pneumococcal Conjugate-13 08/20/2013  . Pneumococcal Polysaccharide-23 11/18/1992, 08/25/2010  . Tetanus 08/20/2013  . Zoster 09/19/2004   Pertinent  Health Maintenance Due  Topic Date Due  . DEXA SCAN  03/15/1993  . INFLUENZA VACCINE  04/19/2017  . PNA vac Low Risk Adult  Completed   Fall Risk  06/06/2014  Falls in the past year? No   Functional Status Survey:    Vitals:   05/18/17 1039  BP: (!) 107/53  Pulse: 79  Resp: (!) 22  Temp: 98.1 F (36.7 C)  SpO2: 96%  Weight: 124 lb 9.6 oz (56.5 kg)   Body mass index is 20.73 kg/m. Physical Exam  Constitutional: She is oriented to person, place, and time. No distress.  HENT:  Head: Normocephalic and atraumatic.  Very HOH  Eyes: Pupils are equal, round, and reactive to light. Conjunctivae are normal. Right eye exhibits no discharge.  Neck: No JVD present.  Cardiovascular: Normal rate.   No murmur heard. irregular  Pulmonary/Chest: Effort normal. No respiratory distress. She has no wheezes.  Decreased both bases  Abdominal: Soft. Bowel sounds are normal. She exhibits no distension. There is no tenderness.  Musculoskeletal: She exhibits no edema or tenderness.  Lymphadenopathy:    She has no cervical adenopathy.  Neurological: She is alert and oriented to person, place, and time.  Skin: Skin is warm and dry. She is not diaphoretic.  Psychiatric: She has a normal mood and affect.  Nursing note and vitals reviewed.   Labs reviewed:  Recent Labs  12/25/16 1550  03/02/17 1504  03/05/17 0326 03/06/17 0226 03/07/17 0333 03/14/17 1238 04/06/17  NA 138  < > 138  < > 137 133* 135 140 138  K 3.7  < > 3.3*  < > 3.2*  3.3* 3.7 4.0 4.5  CL 106  < > 108  < > 101 98* 101  --   --   CO2 23  < > 23  < > 29 27 25 23   --   GLUCOSE 124*  < > 126*  < > 80 94 84 126  --   BUN 23*  < >  24*  < > 9 15 18  19.6 16  CREATININE 0.76  0.77  < > 0.81  < > 0.74 0.67 0.67 0.8 0.8  CALCIUM 9.1  < > 8.4*  < > 7.9* 7.8* 7.9* 9.2  --   MG 2.1  --  2.0  --   --   --   --   --   --   < > = values in this interval not displayed.  Recent Labs  03/06/17 0226 03/07/17 0333 03/14/17 1238  04/12/17 0700 04/17/17 04/17/17 0120  AST 14* 17 12  < > 32 15 15  ALT 15 17 15   < > 85* 25 25  ALKPHOS 48 52 67  < > 333* 178* 178*  BILITOT 0.8 0.8 0.47  --   --   --   --   PROT 4.8* 5.0* 6.2*  --   --   --   --   ALBUMIN 2.7* 2.8* 3.2*  --   --   --   --   < > = values in this interval not displayed.  Recent Labs  03/02/17 1504  03/07/17 0333 03/14/17 1238 03/30/17 1452  04/12/17 0700 04/17/17 0120 05/01/17  WBC 5.9  < > 4.4 3.1* 1.8*  < > 2.2 5.5 1.9  NEUTROABS 3.8  --   --  2.1 1.1*  --   --   --   --   HGB 9.8*  < > 8.1* 8.9* 9.0*  < > 7.9* 7.5* 8.3*  HCT 31.5*  < > 26.8* 28.6* 28.1*  < > 25* 22* 24*  MCV 96.6  < > 97.5 96.6 94.0  --   --   --   --   PLT 292  < > 338 352 276  < > 376 421* 495*  < > = values in this interval not displayed. Lab Results  Component Value Date   TSH 2.712 03/02/2017   Lab Results  Component Value Date   HGBA1C (H) 10/04/2010    5.7 (NOTE)                                                                       According to the ADA Clinical Practice Recommendations for 2011, when HbA1c is used as a screening test:   >=6.5%   Diagnostic of Diabetes Mellitus           (if abnormal result  is confirmed)  5.7-6.4%   Increased risk of developing Diabetes Mellitus  References:Diagnosis and Classification of Diabetes Mellitus,Diabetes CWCB,7628,31(DVVOH 1):S62-S69 and Standards of Medical Care in         Diabetes - 2011,Diabetes YWVP,7106,26  (Suppl 1):S11-S61.   Lab Results  Component Value Date   CHOL 247 (H) 06/05/2014   HDL 75.60 06/05/2014   LDLCALC 156 (H) 06/05/2014   LDLDIRECT 157.6 01/16/2012   TRIG 78.0  06/05/2014   CHOLHDL 3 06/05/2014     Significant Diagnostic Results in last 30 days:  Dg Chest Port 1 View  Result Date: 03/02/2017 CLINICAL DATA:  Shortness of breath tachycardia and productive cough. EXAM: PORTABLE CHEST 1 VIEW COMPARISON:  February 22, 2017. FINDINGS: The heart size and mediastinal contours are stable. The heart size is mildly enlarged. There is minimal increased pulmonary interstitium. There is no focal pneumonia or pleural effusion. The visualized skeletal structures are stable. IMPRESSION: Mild cardiomegaly. Minimal increased pulmonary interstitium unchanged. Electronically Signed   By: Abelardo Diesel M.D.   On: 03/02/2017 15:23    Assessment/Plan  1. MDS/MPN (myelodysplastic/myeloproliferative neoplasms) ([place]) She has decided not to receive treatment at Dr. Ernestina Penna office, despite this, she is doing quite well and seems to be thriving in the skilled environment. However, the WBC is trending downward and so she is at risk for infection. She has told me many times that she is ready to die and does not wish to have any heroic measures to sustain her life. We will discontinue to Agrylin as it is not covered by hospice and not in line with her goals of care.   2. Multifocal atrial tachycardia (H[facility]) Rate controlled Continue Cardizem 360 mg qd and Lopressor 50 mg BID  3. Chronic obstructive pulmonary disease, unspecified COPD type ([place]) She was previously on Symbicort but this was not covered by hospice and then nurse reported that she did not have appropriate technique for an MDI anyway. At this time she is dependent on 2L of oxygen and takes Xopenex and Atrovent BID. IF necessary we can add Pulmicort in the future.   4. Irritable bowel syndrome with diarrhea No flares Continue Bentyl 10 mg TID  5. Chronic diastolic congestive heart failure ([place]) She appears euvolemic on exam.  Continue lasix 20 mg qd   Family/ staff Communication: discussed with resident/nurse Labs/tests ordered: NA

## 2017-05-19 DIAGNOSIS — I25810 Atherosclerosis of coronary artery bypass graft(s) without angina pectoris: Secondary | ICD-10-CM | POA: Diagnosis not present

## 2017-05-19 DIAGNOSIS — I10 Essential (primary) hypertension: Secondary | ICD-10-CM | POA: Diagnosis not present

## 2017-05-19 DIAGNOSIS — D469 Myelodysplastic syndrome, unspecified: Secondary | ICD-10-CM | POA: Diagnosis not present

## 2017-05-19 DIAGNOSIS — I4891 Unspecified atrial fibrillation: Secondary | ICD-10-CM | POA: Diagnosis not present

## 2017-05-19 DIAGNOSIS — I5030 Unspecified diastolic (congestive) heart failure: Secondary | ICD-10-CM | POA: Diagnosis not present

## 2017-05-19 DIAGNOSIS — J449 Chronic obstructive pulmonary disease, unspecified: Secondary | ICD-10-CM | POA: Diagnosis not present

## 2017-05-20 DIAGNOSIS — K589 Irritable bowel syndrome without diarrhea: Secondary | ICD-10-CM | POA: Diagnosis not present

## 2017-05-20 DIAGNOSIS — H11149 Conjunctival xerosis, unspecified, unspecified eye: Secondary | ICD-10-CM | POA: Diagnosis not present

## 2017-05-20 DIAGNOSIS — I5030 Unspecified diastolic (congestive) heart failure: Secondary | ICD-10-CM | POA: Diagnosis not present

## 2017-05-20 DIAGNOSIS — D469 Myelodysplastic syndrome, unspecified: Secondary | ICD-10-CM | POA: Diagnosis not present

## 2017-05-20 DIAGNOSIS — I10 Essential (primary) hypertension: Secondary | ICD-10-CM | POA: Diagnosis not present

## 2017-05-20 DIAGNOSIS — R54 Age-related physical debility: Secondary | ICD-10-CM | POA: Diagnosis not present

## 2017-05-20 DIAGNOSIS — D72818 Other decreased white blood cell count: Secondary | ICD-10-CM | POA: Diagnosis not present

## 2017-05-20 DIAGNOSIS — I25810 Atherosclerosis of coronary artery bypass graft(s) without angina pectoris: Secondary | ICD-10-CM | POA: Diagnosis not present

## 2017-05-20 DIAGNOSIS — I4891 Unspecified atrial fibrillation: Secondary | ICD-10-CM | POA: Diagnosis not present

## 2017-05-20 DIAGNOSIS — J449 Chronic obstructive pulmonary disease, unspecified: Secondary | ICD-10-CM | POA: Diagnosis not present

## 2017-05-20 DIAGNOSIS — R0902 Hypoxemia: Secondary | ICD-10-CM | POA: Diagnosis not present

## 2017-05-20 DIAGNOSIS — K831 Obstruction of bile duct: Secondary | ICD-10-CM | POA: Diagnosis not present

## 2017-05-20 DIAGNOSIS — D630 Anemia in neoplastic disease: Secondary | ICD-10-CM | POA: Diagnosis not present

## 2017-05-20 DIAGNOSIS — D473 Essential (hemorrhagic) thrombocythemia: Secondary | ICD-10-CM | POA: Diagnosis not present

## 2017-05-20 DIAGNOSIS — J918 Pleural effusion in other conditions classified elsewhere: Secondary | ICD-10-CM | POA: Diagnosis not present

## 2017-05-23 DIAGNOSIS — I5030 Unspecified diastolic (congestive) heart failure: Secondary | ICD-10-CM | POA: Diagnosis not present

## 2017-05-23 DIAGNOSIS — I25810 Atherosclerosis of coronary artery bypass graft(s) without angina pectoris: Secondary | ICD-10-CM | POA: Diagnosis not present

## 2017-05-23 DIAGNOSIS — D469 Myelodysplastic syndrome, unspecified: Secondary | ICD-10-CM | POA: Diagnosis not present

## 2017-05-23 DIAGNOSIS — I4891 Unspecified atrial fibrillation: Secondary | ICD-10-CM | POA: Diagnosis not present

## 2017-05-23 DIAGNOSIS — I10 Essential (primary) hypertension: Secondary | ICD-10-CM | POA: Diagnosis not present

## 2017-05-23 DIAGNOSIS — J449 Chronic obstructive pulmonary disease, unspecified: Secondary | ICD-10-CM | POA: Diagnosis not present

## 2017-05-24 DIAGNOSIS — I5030 Unspecified diastolic (congestive) heart failure: Secondary | ICD-10-CM | POA: Diagnosis not present

## 2017-05-24 DIAGNOSIS — I10 Essential (primary) hypertension: Secondary | ICD-10-CM | POA: Diagnosis not present

## 2017-05-24 DIAGNOSIS — J449 Chronic obstructive pulmonary disease, unspecified: Secondary | ICD-10-CM | POA: Diagnosis not present

## 2017-05-24 DIAGNOSIS — I4891 Unspecified atrial fibrillation: Secondary | ICD-10-CM | POA: Diagnosis not present

## 2017-05-24 DIAGNOSIS — I25810 Atherosclerosis of coronary artery bypass graft(s) without angina pectoris: Secondary | ICD-10-CM | POA: Diagnosis not present

## 2017-05-24 DIAGNOSIS — D469 Myelodysplastic syndrome, unspecified: Secondary | ICD-10-CM | POA: Diagnosis not present

## 2017-05-25 ENCOUNTER — Other Ambulatory Visit: Payer: Medicare Other

## 2017-05-25 ENCOUNTER — Ambulatory Visit: Payer: Medicare Other

## 2017-05-25 ENCOUNTER — Ambulatory Visit: Payer: Medicare Other | Admitting: Hematology

## 2017-05-26 ENCOUNTER — Encounter: Payer: Self-pay | Admitting: Adult Health

## 2017-05-26 ENCOUNTER — Non-Acute Institutional Stay (SKILLED_NURSING_FACILITY): Payer: Medicare Other | Admitting: Adult Health

## 2017-05-26 DIAGNOSIS — L309 Dermatitis, unspecified: Secondary | ICD-10-CM | POA: Diagnosis not present

## 2017-05-26 DIAGNOSIS — I4891 Unspecified atrial fibrillation: Secondary | ICD-10-CM | POA: Diagnosis not present

## 2017-05-26 DIAGNOSIS — I10 Essential (primary) hypertension: Secondary | ICD-10-CM | POA: Diagnosis not present

## 2017-05-26 DIAGNOSIS — I25810 Atherosclerosis of coronary artery bypass graft(s) without angina pectoris: Secondary | ICD-10-CM | POA: Diagnosis not present

## 2017-05-26 DIAGNOSIS — D469 Myelodysplastic syndrome, unspecified: Secondary | ICD-10-CM | POA: Diagnosis not present

## 2017-05-26 DIAGNOSIS — J449 Chronic obstructive pulmonary disease, unspecified: Secondary | ICD-10-CM | POA: Diagnosis not present

## 2017-05-26 DIAGNOSIS — I5030 Unspecified diastolic (congestive) heart failure: Secondary | ICD-10-CM | POA: Diagnosis not present

## 2017-05-26 NOTE — Progress Notes (Signed)
Location:  [facility] of Service:  SNF (31) Provider:   Cindi Carbon, ANP [place] [phone]   Gayland Curry, DO  Patient Care Team: Gayland Curry, DO as PCP - General (Geriatric Medicine) Brand Males, MD as Consulting Physician (Pulmonary Disease) [name], MD as Consulting Physician (Orthopedic Surgery)  Extended Emergency Contact Information Primary Emergency Contact: Esses,Tom Address: [address]          [place], [place] [address] [name] of [place] Phone: [phone] Mobile Phone: [phone] Relation: Spouse Secondary Emergency Contact: Hendrix,[name]  [place] Mobile Phone: [phone] Relation: Daughter  Code Status: DNR Goals of care: Advanced Directive information Advanced Directives 05/18/2017  Does Patient Have a Medical Advance Directive? Yes  Type of Advance Directive Out of facility DNR (pink MOST or yellow form);Living will;Healthcare Power of Attorney  Does patient want to make changes to medical advance directive? -  Copy of [place] in Chart? Yes  Would patient like information on creating a medical advance directive? -  Pre-existing out of facility DNR order (yellow form or pink MOST form) Yellow form placed in chart (order not valid for inpatient use);Pink MOST form placed in chart (order not valid for inpatient use)     Chief Complaint  Patient presents with  . Acute Visit    itchy rash    HPI:  Pt is a 83 y.o. female seen today for an acute visit for itchy rash present for the past 24 hrs to both legs. There is dry itchy skin with erythema, no warmth or drainage. She has not changed detergents or lotions. She is requesting cream to help with this problem. No wheezing, SOB. No change in medication.   Past Medical History:  Diagnosis Date  . ALLERGIC RHINITIS   . Anxiety   . Asthma   . Atrial fibrillation ([place])   . Back pain of  thoracolumbar region    right  . Bronchiectasis   . Colles' fracture of left radius   . COPD (chronic obstructive pulmonary disease) ([place])   . Coronary artery disease   . Diastolic dysfunction   . Diverticulosis   . Dysrhythmia   . History of pneumonia   . Hx of cardiovascular stress test    a. ETT-MV 1/14: Exercised 4:16, no ECG changes, poor ex tol, ant defect c/w soft tissue atten, no ischemia, EF 75%  . Hx of colonic polyp   . Hypertension   . IBS (irritable bowel syndrome)   . Insomnia   . Multiple rib fractures 10/2008   bilateral  . Pleural effusion, left   . Pruritus   . Pulmonary nodule   . Syncope    Past Surgical History:  Procedure Laterality Date  . ANKLE CLOSED REDUCTION Right 12/11/2014   Procedure: CLOSED REDUCTION ANKLE;  Surgeon: Rod Can, MD;  Location: [facility];  Service: Orthopedics;  Laterality: Right;  . APPENDECTOMY    . BLADDER SUSPENSION  2005   A-P  . CHOLECYSTECTOMY    . COLONOSCOPY  04/06/2012   Procedure: COLONOSCOPY;  Surgeon: Jerene Bears, MD;  Location: [facility];  Service: Gastroenterology;  Laterality: N/A;  . EXTERNAL FIXATION LEG Right 12/12/2014   Procedure: ADJUSTMENT OF EXTERNAL FIXATION  RIGHT ANKLE;  Surgeon: Rod Can, MD;  Location: [facility];  Service: Orthopedics;  Laterality: Right;  . EXTERNAL FIXATION LEG Right 12/11/2014   Procedure: POSSIBLE EXTERNAL FIXATION RIGHT ANKLE;  Surgeon: Aaron Edelman  Swinteck, MD;  Location: [facility];  Service: Orthopedics;  Laterality: Right;  . HARDWARE REMOVAL Right 12/25/2014   Procedure: RIGHT ANKLE REMOVE OF DEEP IMPLANT ANE EXTERNAL FIXATOR ;  Surgeon: [name], MD;  Location: [facility];  Service: Orthopedics;  Laterality: Right;  . ORIF ANKLE FRACTURE Right 12/25/2014   Procedure: OPEN REDUCTION INTERNAL FIXATION (ORIF)TRIMALLEOLAR  ANKLE FRACTURE;  Surgeon: [name], MD;  Location: [facility];  Service: Orthopedics;  Laterality: Right;  . ORIF WRIST FRACTURE Left  2010     Dr. Burney Gauze.  Marland Kitchen POLYPECTOMY    . ROTATOR CUFF REPAIR Right 2006  . TONSILLECTOMY    . TONSILLECTOMY    . TOTAL KNEE ARTHROPLASTY Right 08/2010    Allergies  Allergen Reactions  . Albuterol Sulfate Palpitations  . Levaquin [Levofloxacin In D5w] Other (See Comments)    QT prolongation. Should avoid all flouroquinolones.   . Lactose Intolerance (Gi) Other (See Comments)  . Amoxicillin Other (See Comments)    REACTION: unspecified  . Codeine Other (See Comments)    Can take Hydrocodone  . Penicillins Hives, Itching and Rash    Hard lump and rash Has patient had a PCN reaction causing immediate rash, facial/tongue/throat swelling, SOB or lightheadedness with hypotension: Yes Has patient had a PCN reaction causing severe rash involving mucus membranes or skin necrosis: No Has patient had a PCN reaction that required hospitalization unknown Has patient had a PCN reaction occurring within the last 10 years: No If all of the above answers are "NO", then may proceed with Cephalosporin use.  . Sulfa Antibiotics Nausea Only  . Sulfonamide Derivatives Nausea Only    Outpatient Encounter Prescriptions as of 05/26/2017  Medication Sig  . acetaminophen (TYLENOL) 500 MG tablet Take 1,000 mg by mouth 2 (two) times daily as needed for headache (pain). Not to exceed 3000mg  daily  . anagrelide (AGRYLIN) 1 MG capsule Take 2 tab in the morning, and 1 tab in everning  . aspirin EC 81 MG tablet Take 1 tablet (81 mg total) by mouth daily.  . bimatoprost (LUMIGAN) 0.01 % SOLN Place 1 drop into both eyes at bedtime.  . clonazePAM (KLONOPIN) 0.5 MG tablet Take 0.5 tablets (0.25 mg total) by mouth 3 (three) times daily as needed (anxiety).  Marland Kitchen dicyclomine (BENTYL) 10 MG capsule Take 1 capsule (10 mg total) by mouth 3 (three) times daily before meals. IBS  . diltiazem (CARDIZEM CD) 360 MG 24 hr capsule Take 1 capsule (360 mg total) by mouth daily.  . furosemide (LASIX) 20 MG tablet Take 1 tablet (20  mg total) by mouth daily.  Marland Kitchen ipratropium (ATROVENT) 0.02 % nebulizer solution Take 0.5 mg by nebulization 2 (two) times daily.  . iron polysaccharides (NIFEREX) 150 MG capsule Take 150 mg by mouth daily.  Marland Kitchen levalbuterol (XOPENEX) 1.25 MG/0.5ML nebulizer solution Take 1.25 mg by nebulization every 6 (six) hours as needed for wheezing or shortness of breath. (Patient taking differently: Take 1.25 mg by nebulization 2 (two) times daily. )  . loperamide (IMODIUM) 2 MG capsule Take 2 mg by mouth as needed for diarrhea or loose stools.   Marland Kitchen losartan (COZAAR) 50 MG tablet Take 1 tablet (50 mg total) by mouth 2 (two) times daily.  . magnesium oxide (MAG-OX) 400 MG tablet Take 400 mg by mouth daily as needed (leg cramps).  . metoprolol tartrate (LOPRESSOR) 50 MG tablet Take 1 tablet (50 mg total) by mouth 2 (two) times daily.  . Multiple  Vitamin (MULTIVITAMIN WITH MINERALS) TABS tablet Take 1 tablet by mouth daily.  . Nutritional Supplements (FEEDING SUPPLEMENT, BOOST BREEZE,) LIQD Take 1 Can by mouth 2 (two) times daily.  Marland Kitchen oxycodone (OXY-IR) 5 MG capsule Take 5 mg by mouth every 4 (four) hours as needed.  . polyvinyl alcohol (LIQUIFILM TEARS) 1.4 % ophthalmic solution Place 1 drop into both eyes 3 (three) times daily.  Marland Kitchen zolpidem (AMBIEN) 5 MG tablet Take 1 tablet (5 mg total) by mouth at bedtime as needed for sleep.   No facility-administered encounter medications on file as of 05/26/2017.     Review of Systems  Constitutional: Negative for activity change, appetite change, chills, diaphoresis, fatigue and fever.  Skin: Positive for rash.    Immunization History  Administered Date(s) Administered  . H1N1 09/30/2008  . Influenza Split 05/30/2012, 07/01/2013  . Influenza Whole 06/19/2009, 06/20/2011  . Influenza, High Dose Seasonal PF 07/01/2013  . Influenza,inj,Quad PF,6+ Mos 06/05/2014  . Pneumococcal Conjugate-13 08/20/2013  . Pneumococcal Polysaccharide-23 11/18/1992, 08/25/2010  . Tetanus  08/20/2013  . Zoster 09/19/2004   Pertinent  Health Maintenance Due  Topic Date Due  . DEXA SCAN  03/15/1993  . INFLUENZA VACCINE  04/19/2017  . PNA vac Low Risk Adult  Completed   Fall Risk  06/06/2014  Falls in the past year? No   Functional Status Survey:    Vitals:   05/26/17 1243  BP: (!) 140/50  Pulse: 82  Resp: 20  Temp: 98.2 F (36.8 C)  SpO2: 91%   There is no height or weight on file to calculate BMI. Physical Exam  Constitutional: No distress.  Skin: Rash (mily macular erythematous rash to thighs and calves. ) noted. She is not diaphoretic.    Labs reviewed:  Recent Labs  12/25/16 1550  03/02/17 1504  03/05/17 0326 03/06/17 0226 03/07/17 0333 03/14/17 1238 04/06/17  NA 138  < > 138  < > 137 133* 135 140 138  K 3.7  < > 3.3*  < > 3.2* 3.3* 3.7 4.0 4.5  CL 106  < > 108  < > 101 98* 101  --   --   CO2 23  < > 23  < > 29 27 25 23   --   GLUCOSE 124*  < > 126*  < > 80 94 84 126  --   BUN 23*  < > 24*  < > 9 15 18  19.6 16  CREATININE 0.76  0.77  < > 0.81  < > 0.74 0.67 0.67 0.8 0.8  CALCIUM 9.1  < > 8.4*  < > 7.9* 7.8* 7.9* 9.2  --   MG 2.1  --  2.0  --   --   --   --   --   --   < > = values in this interval not displayed.  Recent Labs  03/06/17 0226 03/07/17 0333 03/14/17 1238  04/12/17 0700 04/17/17 04/17/17 0120  AST 14* 17 12  < > 32 15 15  ALT 15 17 15   < > 85* 25 25  ALKPHOS 48 52 67  < > 333* 178* 178*  BILITOT 0.8 0.8 0.47  --   --   --   --   PROT 4.8* 5.0* 6.2*  --   --   --   --   ALBUMIN 2.7* 2.8* 3.2*  --   --   --   --   < > = values in this interval not displayed.  Recent  Labs  03/02/17 1504  03/07/17 0333 03/14/17 1238 03/30/17 1452  04/12/17 0700 04/17/17 0120 05/01/17  WBC 5.9  < > 4.4 3.1* 1.8*  < > 2.2 5.5 1.9  NEUTROABS 3.8  --   --  2.1 1.1*  --   --   --   --   HGB 9.8*  < > 8.1* 8.9* 9.0*  < > 7.9* 7.5* 8.3*  HCT 31.5*  < > 26.8* 28.6* 28.1*  < > 25* 22* 24*  MCV 96.6  < > 97.5 96.6 94.0  --   --   --   --     PLT 292  < > 338 352 276  < > 376 421* 495*  < > = values in this interval not displayed. Lab Results  Component Value Date   TSH 2.712 03/02/2017   Lab Results  Component Value Date   HGBA1C (H) 10/04/2010    5.7 (NOTE)                                                                       According to the ADA Clinical Practice Recommendations for 2011, when HbA1c is used as a screening test:   >=6.5%   Diagnostic of Diabetes Mellitus           (if abnormal result  is confirmed)  5.7-6.4%   Increased risk of developing Diabetes Mellitus  References:Diagnosis and Classification of Diabetes Mellitus,Diabetes SVXB,9390,30(SPQZR 1):S62-S69 and Standards of Medical Care in         Diabetes - 2011,Diabetes AQTM,2263,33  (Suppl 1):S11-S61.   Lab Results  Component Value Date   CHOL 247 (H) 06/05/2014   HDL 75.60 06/05/2014   LDLCALC 156 (H) 06/05/2014   LDLDIRECT 157.6 01/16/2012   TRIG 78.0 06/05/2014   CHOLHDL 3 06/05/2014    Significant Diagnostic Results in last 30 days:  No results found.  Assessment/Plan   1. Dermatitis Triamcinolone 0.1 % BID scheduled    Family/ staff Communication: discussed  Labs/tests ordered:  NA

## 2017-05-31 DIAGNOSIS — I4891 Unspecified atrial fibrillation: Secondary | ICD-10-CM | POA: Diagnosis not present

## 2017-05-31 DIAGNOSIS — J449 Chronic obstructive pulmonary disease, unspecified: Secondary | ICD-10-CM | POA: Diagnosis not present

## 2017-05-31 DIAGNOSIS — D469 Myelodysplastic syndrome, unspecified: Secondary | ICD-10-CM | POA: Diagnosis not present

## 2017-05-31 DIAGNOSIS — I25810 Atherosclerosis of coronary artery bypass graft(s) without angina pectoris: Secondary | ICD-10-CM | POA: Diagnosis not present

## 2017-05-31 DIAGNOSIS — I5030 Unspecified diastolic (congestive) heart failure: Secondary | ICD-10-CM | POA: Diagnosis not present

## 2017-05-31 DIAGNOSIS — I10 Essential (primary) hypertension: Secondary | ICD-10-CM | POA: Diagnosis not present

## 2017-06-07 DIAGNOSIS — I10 Essential (primary) hypertension: Secondary | ICD-10-CM | POA: Diagnosis not present

## 2017-06-07 DIAGNOSIS — I25810 Atherosclerosis of coronary artery bypass graft(s) without angina pectoris: Secondary | ICD-10-CM | POA: Diagnosis not present

## 2017-06-07 DIAGNOSIS — D469 Myelodysplastic syndrome, unspecified: Secondary | ICD-10-CM | POA: Diagnosis not present

## 2017-06-07 DIAGNOSIS — I5030 Unspecified diastolic (congestive) heart failure: Secondary | ICD-10-CM | POA: Diagnosis not present

## 2017-06-07 DIAGNOSIS — I4891 Unspecified atrial fibrillation: Secondary | ICD-10-CM | POA: Diagnosis not present

## 2017-06-07 DIAGNOSIS — J449 Chronic obstructive pulmonary disease, unspecified: Secondary | ICD-10-CM | POA: Diagnosis not present

## 2017-06-19 DIAGNOSIS — D473 Essential (hemorrhagic) thrombocythemia: Secondary | ICD-10-CM | POA: Diagnosis not present

## 2017-06-19 DIAGNOSIS — R0902 Hypoxemia: Secondary | ICD-10-CM | POA: Diagnosis not present

## 2017-06-19 DIAGNOSIS — K831 Obstruction of bile duct: Secondary | ICD-10-CM | POA: Diagnosis not present

## 2017-06-19 DIAGNOSIS — J918 Pleural effusion in other conditions classified elsewhere: Secondary | ICD-10-CM | POA: Diagnosis not present

## 2017-06-19 DIAGNOSIS — H11149 Conjunctival xerosis, unspecified, unspecified eye: Secondary | ICD-10-CM | POA: Diagnosis not present

## 2017-06-19 DIAGNOSIS — D469 Myelodysplastic syndrome, unspecified: Secondary | ICD-10-CM | POA: Diagnosis not present

## 2017-06-19 DIAGNOSIS — I4891 Unspecified atrial fibrillation: Secondary | ICD-10-CM | POA: Diagnosis not present

## 2017-06-19 DIAGNOSIS — I10 Essential (primary) hypertension: Secondary | ICD-10-CM | POA: Diagnosis not present

## 2017-06-19 DIAGNOSIS — K589 Irritable bowel syndrome without diarrhea: Secondary | ICD-10-CM | POA: Diagnosis not present

## 2017-06-19 DIAGNOSIS — D72818 Other decreased white blood cell count: Secondary | ICD-10-CM | POA: Diagnosis not present

## 2017-06-19 DIAGNOSIS — I25810 Atherosclerosis of coronary artery bypass graft(s) without angina pectoris: Secondary | ICD-10-CM | POA: Diagnosis not present

## 2017-06-19 DIAGNOSIS — I5030 Unspecified diastolic (congestive) heart failure: Secondary | ICD-10-CM | POA: Diagnosis not present

## 2017-06-19 DIAGNOSIS — J449 Chronic obstructive pulmonary disease, unspecified: Secondary | ICD-10-CM | POA: Diagnosis not present

## 2017-06-19 DIAGNOSIS — R54 Age-related physical debility: Secondary | ICD-10-CM | POA: Diagnosis not present

## 2017-06-19 DIAGNOSIS — D630 Anemia in neoplastic disease: Secondary | ICD-10-CM | POA: Diagnosis not present

## 2017-06-21 DIAGNOSIS — J449 Chronic obstructive pulmonary disease, unspecified: Secondary | ICD-10-CM | POA: Diagnosis not present

## 2017-06-21 DIAGNOSIS — I25810 Atherosclerosis of coronary artery bypass graft(s) without angina pectoris: Secondary | ICD-10-CM | POA: Diagnosis not present

## 2017-06-21 DIAGNOSIS — I4891 Unspecified atrial fibrillation: Secondary | ICD-10-CM | POA: Diagnosis not present

## 2017-06-21 DIAGNOSIS — I5030 Unspecified diastolic (congestive) heart failure: Secondary | ICD-10-CM | POA: Diagnosis not present

## 2017-06-21 DIAGNOSIS — I10 Essential (primary) hypertension: Secondary | ICD-10-CM | POA: Diagnosis not present

## 2017-06-21 DIAGNOSIS — D469 Myelodysplastic syndrome, unspecified: Secondary | ICD-10-CM | POA: Diagnosis not present

## 2017-06-30 DIAGNOSIS — I25810 Atherosclerosis of coronary artery bypass graft(s) without angina pectoris: Secondary | ICD-10-CM | POA: Diagnosis not present

## 2017-06-30 DIAGNOSIS — I10 Essential (primary) hypertension: Secondary | ICD-10-CM | POA: Diagnosis not present

## 2017-06-30 DIAGNOSIS — D469 Myelodysplastic syndrome, unspecified: Secondary | ICD-10-CM | POA: Diagnosis not present

## 2017-06-30 DIAGNOSIS — J449 Chronic obstructive pulmonary disease, unspecified: Secondary | ICD-10-CM | POA: Diagnosis not present

## 2017-06-30 DIAGNOSIS — I4891 Unspecified atrial fibrillation: Secondary | ICD-10-CM | POA: Diagnosis not present

## 2017-06-30 DIAGNOSIS — I5030 Unspecified diastolic (congestive) heart failure: Secondary | ICD-10-CM | POA: Diagnosis not present

## 2017-07-07 DIAGNOSIS — I5030 Unspecified diastolic (congestive) heart failure: Secondary | ICD-10-CM | POA: Diagnosis not present

## 2017-07-07 DIAGNOSIS — I4891 Unspecified atrial fibrillation: Secondary | ICD-10-CM | POA: Diagnosis not present

## 2017-07-07 DIAGNOSIS — J449 Chronic obstructive pulmonary disease, unspecified: Secondary | ICD-10-CM | POA: Diagnosis not present

## 2017-07-07 DIAGNOSIS — D469 Myelodysplastic syndrome, unspecified: Secondary | ICD-10-CM | POA: Diagnosis not present

## 2017-07-07 DIAGNOSIS — I25810 Atherosclerosis of coronary artery bypass graft(s) without angina pectoris: Secondary | ICD-10-CM | POA: Diagnosis not present

## 2017-07-07 DIAGNOSIS — I10 Essential (primary) hypertension: Secondary | ICD-10-CM | POA: Diagnosis not present

## 2017-07-09 DIAGNOSIS — I25810 Atherosclerosis of coronary artery bypass graft(s) without angina pectoris: Secondary | ICD-10-CM | POA: Diagnosis not present

## 2017-07-09 DIAGNOSIS — I4891 Unspecified atrial fibrillation: Secondary | ICD-10-CM | POA: Diagnosis not present

## 2017-07-09 DIAGNOSIS — I10 Essential (primary) hypertension: Secondary | ICD-10-CM | POA: Diagnosis not present

## 2017-07-09 DIAGNOSIS — J449 Chronic obstructive pulmonary disease, unspecified: Secondary | ICD-10-CM | POA: Diagnosis not present

## 2017-07-09 DIAGNOSIS — D469 Myelodysplastic syndrome, unspecified: Secondary | ICD-10-CM | POA: Diagnosis not present

## 2017-07-09 DIAGNOSIS — I5030 Unspecified diastolic (congestive) heart failure: Secondary | ICD-10-CM | POA: Diagnosis not present

## 2017-07-14 DIAGNOSIS — J449 Chronic obstructive pulmonary disease, unspecified: Secondary | ICD-10-CM | POA: Diagnosis not present

## 2017-07-14 DIAGNOSIS — I10 Essential (primary) hypertension: Secondary | ICD-10-CM | POA: Diagnosis not present

## 2017-07-14 DIAGNOSIS — I4891 Unspecified atrial fibrillation: Secondary | ICD-10-CM | POA: Diagnosis not present

## 2017-07-14 DIAGNOSIS — I25810 Atherosclerosis of coronary artery bypass graft(s) without angina pectoris: Secondary | ICD-10-CM | POA: Diagnosis not present

## 2017-07-14 DIAGNOSIS — I5030 Unspecified diastolic (congestive) heart failure: Secondary | ICD-10-CM | POA: Diagnosis not present

## 2017-07-14 DIAGNOSIS — D469 Myelodysplastic syndrome, unspecified: Secondary | ICD-10-CM | POA: Diagnosis not present

## 2017-07-18 ENCOUNTER — Encounter: Payer: Self-pay | Admitting: Internal Medicine

## 2017-07-18 NOTE — Progress Notes (Signed)
Patient ID: Lori [name], female   DOB: November 28, 1925, 83 y.o.   MRN: [id number]  Location:  [facility] Room Number: [place], [place], DO  Patient Care Team: Gayland Curry, DO as PCP - General (Geriatric Medicine) Brand Males, MD as Consulting Physician (Pulmonary Disease) [name], MD as Consulting Physician (Orthopedic Surgery)  Extended Emergency Contact Information Primary Emergency Contact: Meaders,Tom Address: [address]          [place], [place] [address] [name] of [place] Phone: [phone] Mobile Phone: [phone] Relation: Spouse Secondary Emergency Contact: Hendrix,[name]  [place] Mobile Phone: [phone] Relation: Daughter  Goals of care: Advanced Directive information Advanced Directives 07/18/2017  Does Patient Have a Medical Advance Directive? Yes  Type of Advance Directive Out of facility DNR (pink MOST or yellow form);Healthcare Power of Attorney  Does patient want to make changes to medical advance directive? -  Copy of [place] in Chart? Yes  Would patient like information on creating a medical advance directive? -  Pre-existing out of facility DNR order (yellow form or pink MOST form) Yellow form placed in chart (order not valid for inpatient use);Pink MOST form placed in chart (order not valid for inpatient use)     Chief Complaint  Patient presents with  . Medical Management of Chronic Issues    routine visit    HPI:  Pt is a 83 y.o. female seen today for medical management of chronic diseases.     Past Medical History:  Diagnosis Date  . ALLERGIC RHINITIS   . Anxiety   . Asthma   . Atrial fibrillation ([facility])   . Back pain of thoracolumbar region    right  . Bronchiectasis   . Colles' fracture of left radius   . COPD (chronic obstructive pulmonary disease) ([facility])   . Coronary artery disease   . Diastolic dysfunction   . Diverticulosis   .  Dysrhythmia   . History of pneumonia   . Hx of cardiovascular stress test    a. ETT-MV 1/14: Exercised 4:16, no ECG changes, poor ex tol, ant defect c/w soft tissue atten, no ischemia, EF 75%  . Hx of colonic polyp   . Hypertension   . IBS (irritable bowel syndrome)   . Insomnia   . Multiple rib fractures 10/2008   bilateral  . Pleural effusion, left   . Pruritus   . Pulmonary nodule   . Syncope    Past Surgical History:  Procedure Laterality Date  . ANKLE CLOSED REDUCTION Right 12/11/2014   Procedure: CLOSED REDUCTION ANKLE;  Surgeon: Rod Can, MD;  Location: [facility];  Service: Orthopedics;  Laterality: Right;  . APPENDECTOMY    . BLADDER SUSPENSION  2005   A-P  . CHOLECYSTECTOMY    . COLONOSCOPY  04/06/2012   Procedure: COLONOSCOPY;  Surgeon: Jerene Bears, MD;  [facility];  Service: Gastroenterology;  Laterality: N/A;  . EXTERNAL FIXATION LEG Right 12/12/2014   Procedure: ADJUSTMENT OF EXTERNAL FIXATION  RIGHT ANKLE;  Surgeon: Rod Can, MD;  Location: [facility];  Service: Orthopedics;  Laterality: Right;  . EXTERNAL FIXATION LEG Right 12/11/2014   Procedure: POSSIBLE EXTERNAL FIXATION RIGHT ANKLE;  Surgeon: Rod Can, MD;  Location: [facility];  Service: Orthopedics;  Laterality: Right;  . HARDWARE REMOVAL Right 12/25/2014   Procedure: RIGHT ANKLE REMOVE OF DEEP IMPLANT ANE EXTERNAL FIXATOR ;  Surgeon: [name], MD;  [facility];  Service: Orthopedics;  Laterality: Right;  . ORIF ANKLE FRACTURE Right 12/25/2014   Procedure: OPEN REDUCTION INTERNAL FIXATION (ORIF)TRIMALLEOLAR  ANKLE FRACTURE;  Surgeon: [name], MD;  Location: [facility];  Service: Orthopedics;  Laterality: Right;  . ORIF WRIST FRACTURE Left 2010     Dr. Burney Gauze.  Marland Kitchen POLYPECTOMY    . ROTATOR CUFF REPAIR Right 2006  . TONSILLECTOMY    . TONSILLECTOMY    . TOTAL KNEE ARTHROPLASTY Right 08/2010    Allergies  Allergen Reactions  . Albuterol Sulfate  Palpitations  . Levaquin [Levofloxacin In D5w] Other (See Comments)    QT prolongation. Should avoid all flouroquinolones.   . Lactose Intolerance (Gi) Other (See Comments)  . Amoxicillin Other (See Comments)    REACTION: unspecified  . Codeine Other (See Comments)    Can take Hydrocodone  . Penicillins Hives, Itching and Rash    Hard lump and rash Has patient had a PCN reaction causing immediate rash, facial/tongue/throat swelling, SOB or lightheadedness with hypotension: Yes Has patient had a PCN reaction causing severe rash involving mucus membranes or skin necrosis: No Has patient had a PCN reaction that required hospitalization unknown Has patient had a PCN reaction occurring within the last 10 years: No If all of the above answers are "NO", then may proceed with Cephalosporin use.  . Sulfa Antibiotics Nausea Only  . Sulfonamide Derivatives Nausea Only    Outpatient Encounter Prescriptions as of 07/18/2017  Medication Sig  . acetaminophen (TYLENOL) 500 MG tablet Take 1,000 mg by mouth 2 (two) times daily as needed for headache (pain). Not to exceed 3000mg  daily  . aspirin EC 81 MG tablet Take 1 tablet (81 mg total) by mouth daily.  . bimatoprost (LUMIGAN) 0.01 % SOLN Place 1 drop into both eyes at bedtime.  . cetirizine (ZYRTEC) 10 MG tablet Take 10 mg by mouth daily.  . clonazePAM (KLONOPIN) 0.5 MG tablet Take 0.5 tablets (0.25 mg total) by mouth 3 (three) times daily as needed (anxiety).  Marland Kitchen dicyclomine (BENTYL) 10 MG capsule Take 1 capsule (10 mg total) by mouth 3 (three) times daily before meals. IBS  . diltiazem (CARDIZEM CD) 360 MG 24 hr capsule Take 1 capsule (360 mg total) by mouth daily.  . Emollient (EUCERIN) lotion Apply topically as needed for dry skin.  . furosemide (LASIX) 20 MG tablet Take 1 tablet (20 mg total) by mouth daily.  . hydroxypropyl methylcellulose / hypromellose (ISOPTO TEARS / GONIOVISC) 2.5 % ophthalmic solution Place 1 drop into both eyes 3 (three)  times daily.  Marland Kitchen ipratropium (ATROVENT) 0.02 % nebulizer solution Take 0.5 mg by nebulization 2 (two) times daily.  . iron polysaccharides (NIFEREX) 150 MG capsule Take 150 mg by mouth daily.  Marland Kitchen levalbuterol (XOPENEX) 1.25 MG/0.5ML nebulizer solution Take 1.25 mg by nebulization every 6 (six) hours as needed for wheezing or shortness of breath.  . loperamide (IMODIUM) 2 MG capsule Take 2 mg by mouth as needed for diarrhea or loose stools.   Marland Kitchen losartan (COZAAR) 50 MG tablet Take 1 tablet (50 mg total) by mouth 2 (two) times daily.  . magnesium oxide (MAG-OX) 400 MG tablet Take 400 mg by mouth daily as needed (leg cramps).  . metoprolol tartrate (LOPRESSOR) 50 MG tablet Take 1 tablet (50 mg total) by mouth 2 (two) times daily.  . Multiple Vitamin (MULTIVITAMIN WITH MINERALS) TABS tablet Take 1 tablet by mouth daily.  . Nutritional Supplements (FEEDING SUPPLEMENT, BOOST BREEZE,) LIQD Take  1 Can by mouth 2 (two) times daily.  Marland Kitchen oxycodone (OXY-IR) 5 MG capsule Take 5 mg by mouth every 4 (four) hours as needed.  . zolpidem (AMBIEN) 5 MG tablet Take 1 tablet (5 mg total) by mouth at bedtime as needed for sleep.  . [DISCONTINUED] anagrelide (AGRYLIN) 1 MG capsule Take 2 tab in the morning, and 1 tab in everning  . [DISCONTINUED] polyvinyl alcohol (LIQUIFILM TEARS) 1.4 % ophthalmic solution Place 1 drop into both eyes 3 (three) times daily.   No facility-administered encounter medications on file as of 07/18/2017.     Review of Systems  Immunization History  Administered Date(s) Administered  . H1N1 09/30/2008  . Influenza Split 05/30/2012, 07/01/2013  . Influenza Whole 06/19/2009, 06/20/2011  . Influenza, High Dose Seasonal PF 07/01/2013  . Influenza,inj,Quad PF,6+ Mos 06/05/2014  . Pneumococcal Conjugate-13 08/20/2013  . Pneumococcal Polysaccharide-23 11/18/1992, 08/25/2010  . Tetanus 08/20/2013  . Zoster 09/19/2004   Pertinent  Health Maintenance Due  Topic Date Due  . DEXA SCAN   03/15/1993  . INFLUENZA VACCINE  04/19/2017  . PNA vac Low Risk Adult  Completed   Fall Risk  06/06/2014  Falls in the past year? No   Functional Status Survey:    Vitals:   07/18/17 1403  BP: 122/63  Pulse: 67  Resp: 18  Temp: (!) 97 F (36.1 C)  TempSrc: Oral  SpO2: 93%  Weight: 130 lb (59 kg)   Body mass index is 21.63 kg/m. Physical Exam  Labs reviewed:  Recent Labs  12/25/16 1550  03/02/17 1504  03/05/17 0326 03/06/17 0226 03/07/17 0333 03/14/17 1238 04/06/17  NA 138  < > 138  < > 137 133* 135 140 138  K 3.7  < > 3.3*  < > 3.2* 3.3* 3.7 4.0 4.5  CL 106  < > 108  < > 101 98* 101  --   --   CO2 23  < > 23  < > 29 27 25 23   --   GLUCOSE 124*  < > 126*  < > 80 94 84 126  --   BUN 23*  < > 24*  < > 9 15 18  19.6 16  CREATININE 0.76  0.77  < > 0.81  < > 0.74 0.67 0.67 0.8 0.8  CALCIUM 9.1  < > 8.4*  < > 7.9* 7.8* 7.9* 9.2  --   MG 2.1  --  2.0  --   --   --   --   --   --   < > = values in this interval not displayed.  Recent Labs  03/06/17 0226 03/07/17 0333 03/14/17 1238  04/12/17 0700 04/17/17 04/17/17 0120  AST 14* 17 12  < > 32 15 15  ALT 15 17 15   < > 85* 25 25  ALKPHOS 48 52 67  < > 333* 178* 178*  BILITOT 0.8 0.8 0.47  --   --   --   --   PROT 4.8* 5.0* 6.2*  --   --   --   --   ALBUMIN 2.7* 2.8* 3.2*  --   --   --   --   < > = values in this interval not displayed.  Recent Labs  03/02/17 1504  03/07/17 0333 03/14/17 1238 03/30/17 1452  04/12/17 0700 04/17/17 0120 05/01/17  WBC 5.9  < > 4.4 3.1* 1.8*  < > 2.2 5.5 1.9  NEUTROABS 3.8  --   --  2.1 1.1*  --   --   --   --   HGB 9.8*  < > 8.1* 8.9* 9.0*  < > 7.9* 7.5* 8.3*  HCT 31.5*  < > 26.8* 28.6* 28.1*  < > 25* 22* 24*  MCV 96.6  < > 97.5 96.6 94.0  --   --   --   --   PLT 292  < > 338 352 276  < > 376 421* 495*  < > = values in this interval not displayed. Lab Results  Component Value Date   TSH 2.712 03/02/2017   Lab Results  Component Value Date   HGBA1C (H) 10/04/2010     5.7 (NOTE)                                                                       According to the ADA Clinical Practice Recommendations for 2011, when HbA1c is used as a screening test:   >=6.5%   Diagnostic of Diabetes Mellitus           (if abnormal result  is confirmed)  5.7-6.4%   Increased risk of developing Diabetes Mellitus  References:Diagnosis and Classification of Diabetes Mellitus,Diabetes [id number],0258,52(DPOEU 1):S62-S69 and Standards of Medical Care in         Diabetes - 2011,Diabetes MPNT,6144,31  (Suppl 1):S11-S61.   Lab Results  Component Value Date   CHOL 247 (H) 06/05/2014   HDL 75.60 06/05/2014   LDLCALC 156 (H) 06/05/2014   LDLDIRECT 157.6 01/16/2012   TRIG 78.0 06/05/2014   CHOLHDL 3 06/05/2014

## 2017-07-19 DIAGNOSIS — I4891 Unspecified atrial fibrillation: Secondary | ICD-10-CM | POA: Diagnosis not present

## 2017-07-19 DIAGNOSIS — I25810 Atherosclerosis of coronary artery bypass graft(s) without angina pectoris: Secondary | ICD-10-CM | POA: Diagnosis not present

## 2017-07-19 DIAGNOSIS — D469 Myelodysplastic syndrome, unspecified: Secondary | ICD-10-CM | POA: Diagnosis not present

## 2017-07-19 DIAGNOSIS — I5030 Unspecified diastolic (congestive) heart failure: Secondary | ICD-10-CM | POA: Diagnosis not present

## 2017-07-19 DIAGNOSIS — I10 Essential (primary) hypertension: Secondary | ICD-10-CM | POA: Diagnosis not present

## 2017-07-19 DIAGNOSIS — J449 Chronic obstructive pulmonary disease, unspecified: Secondary | ICD-10-CM | POA: Diagnosis not present

## 2017-07-20 ENCOUNTER — Telehealth: Payer: Self-pay | Admitting: Nurse Practitioner

## 2017-07-20 DIAGNOSIS — K831 Obstruction of bile duct: Secondary | ICD-10-CM | POA: Diagnosis not present

## 2017-07-20 DIAGNOSIS — K589 Irritable bowel syndrome without diarrhea: Secondary | ICD-10-CM | POA: Diagnosis not present

## 2017-07-20 DIAGNOSIS — D473 Essential (hemorrhagic) thrombocythemia: Secondary | ICD-10-CM | POA: Diagnosis not present

## 2017-07-20 DIAGNOSIS — H11149 Conjunctival xerosis, unspecified, unspecified eye: Secondary | ICD-10-CM | POA: Diagnosis not present

## 2017-07-20 DIAGNOSIS — D72818 Other decreased white blood cell count: Secondary | ICD-10-CM | POA: Diagnosis not present

## 2017-07-20 DIAGNOSIS — D630 Anemia in neoplastic disease: Secondary | ICD-10-CM | POA: Diagnosis not present

## 2017-07-20 DIAGNOSIS — I25810 Atherosclerosis of coronary artery bypass graft(s) without angina pectoris: Secondary | ICD-10-CM | POA: Diagnosis not present

## 2017-07-20 DIAGNOSIS — D469 Myelodysplastic syndrome, unspecified: Secondary | ICD-10-CM | POA: Diagnosis not present

## 2017-07-20 DIAGNOSIS — I10 Essential (primary) hypertension: Secondary | ICD-10-CM | POA: Diagnosis not present

## 2017-07-20 DIAGNOSIS — R54 Age-related physical debility: Secondary | ICD-10-CM | POA: Diagnosis not present

## 2017-07-20 DIAGNOSIS — I5030 Unspecified diastolic (congestive) heart failure: Secondary | ICD-10-CM | POA: Diagnosis not present

## 2017-07-20 DIAGNOSIS — J918 Pleural effusion in other conditions classified elsewhere: Secondary | ICD-10-CM | POA: Diagnosis not present

## 2017-07-20 DIAGNOSIS — R0902 Hypoxemia: Secondary | ICD-10-CM | POA: Diagnosis not present

## 2017-07-20 DIAGNOSIS — J449 Chronic obstructive pulmonary disease, unspecified: Secondary | ICD-10-CM | POA: Diagnosis not present

## 2017-07-20 DIAGNOSIS — I4891 Unspecified atrial fibrillation: Secondary | ICD-10-CM | POA: Diagnosis not present

## 2017-07-20 NOTE — Telephone Encounter (Signed)
New message  Pt verbalized that he want his wife to have an appt with Cecille Rubin at the same day and time as him for 07/25/2017

## 2017-07-21 DIAGNOSIS — I10 Essential (primary) hypertension: Secondary | ICD-10-CM | POA: Diagnosis not present

## 2017-07-21 DIAGNOSIS — J449 Chronic obstructive pulmonary disease, unspecified: Secondary | ICD-10-CM | POA: Diagnosis not present

## 2017-07-21 DIAGNOSIS — I4891 Unspecified atrial fibrillation: Secondary | ICD-10-CM | POA: Diagnosis not present

## 2017-07-21 DIAGNOSIS — I25810 Atherosclerosis of coronary artery bypass graft(s) without angina pectoris: Secondary | ICD-10-CM | POA: Diagnosis not present

## 2017-07-21 DIAGNOSIS — I5030 Unspecified diastolic (congestive) heart failure: Secondary | ICD-10-CM | POA: Diagnosis not present

## 2017-07-21 DIAGNOSIS — D469 Myelodysplastic syndrome, unspecified: Secondary | ICD-10-CM | POA: Diagnosis not present

## 2017-07-24 NOTE — Telephone Encounter (Signed)
lvm that pt's request cannot be made at this time.  Call back to discuss further.

## 2017-07-28 DIAGNOSIS — I25810 Atherosclerosis of coronary artery bypass graft(s) without angina pectoris: Secondary | ICD-10-CM | POA: Diagnosis not present

## 2017-07-28 DIAGNOSIS — I4891 Unspecified atrial fibrillation: Secondary | ICD-10-CM | POA: Diagnosis not present

## 2017-07-28 DIAGNOSIS — I5030 Unspecified diastolic (congestive) heart failure: Secondary | ICD-10-CM | POA: Diagnosis not present

## 2017-07-28 DIAGNOSIS — D469 Myelodysplastic syndrome, unspecified: Secondary | ICD-10-CM | POA: Diagnosis not present

## 2017-07-28 DIAGNOSIS — J449 Chronic obstructive pulmonary disease, unspecified: Secondary | ICD-10-CM | POA: Diagnosis not present

## 2017-07-28 DIAGNOSIS — I10 Essential (primary) hypertension: Secondary | ICD-10-CM | POA: Diagnosis not present

## 2017-07-31 ENCOUNTER — Non-Acute Institutional Stay: Payer: Medicare Other | Admitting: Adult Health

## 2017-07-31 ENCOUNTER — Encounter: Payer: Self-pay | Admitting: Adult Health

## 2017-07-31 DIAGNOSIS — R635 Abnormal weight gain: Secondary | ICD-10-CM

## 2017-07-31 DIAGNOSIS — I5033 Acute on chronic diastolic (congestive) heart failure: Secondary | ICD-10-CM | POA: Diagnosis not present

## 2017-07-31 NOTE — Progress Notes (Signed)
Location:  Occupational psychologist of Service:  SNF (31) Provider:   Cindi Carbon, ANP [place] [phone]   Gayland Curry, DO  Patient Care Team: Gayland Curry, DO as PCP - General (Geriatric Medicine) Brand Males, MD as Consulting Physician (Pulmonary Disease) [name], MD as Consulting Physician (Orthopedic Surgery)  Extended Emergency Contact Information Primary Emergency Contact: Pensinger,Tom Address: [place], [place] [address] [name] [place] Phone: [phone] Mobile Phone: [phone] Relation: Spouse Secondary Emergency Contact: Hendrix,[name]  [place] Mobile Phone: [phone] Relation: Daughter  Code Status:  DNR Goals of care: Advanced Directive information Advanced Directives 07/18/2017  Does Patient Have a Medical Advance Directive? Yes  Type of Advance Directive Out of facility DNR (pink MOST or yellow form);Healthcare Power of Attorney  Does patient want to make changes to medical advance directive? -  Copy of [place] in Chart? Yes  Would patient like information on creating a medical advance directive? -  Pre-existing out of facility DNR order (yellow form or pink MOST form) Yellow form placed in chart (order not valid for inpatient use);Pink MOST form placed in chart (order not valid for inpatient use)     Chief Complaint  Patient presents with  . Acute Visit    weigh gain    HPI:  Pt is a 83 y.o. female seen today for an acute visit for weight gain and increased shortness of breath.  Currently followed by hospice due to MDS.  Weighs are as follows 124, 127, 130, 133 over a period of 6 weeks.  She has increased edema in both legs but refuses to wear compression hose. She is oxygen dependent at 2L sats unchanged in the 90's.   [name] states that when she walks to the dining area she is short of breath but does well with shorter  distances. This is slightly worse from her baseline.  Refuses boost due to weight gain.   Past Medical History:  Diagnosis Date  . ALLERGIC RHINITIS   . Anxiety   . Asthma   . Atrial fibrillation ([place])   . Back pain of thoracolumbar region    right  . Bronchiectasis   . Colles' fracture of left radius   . COPD (chronic obstructive pulmonary disease) ([place])   . Coronary artery disease   . Diastolic dysfunction   . Diverticulosis   . Dysrhythmia   . History of pneumonia   . Hx of cardiovascular stress test    a. ETT-MV 1/14: Exercised 4:16, no ECG changes, poor ex tol, ant defect c/w soft tissue atten, no ischemia, EF 75%  . Hx of colonic polyp   . Hypertension   . IBS (irritable bowel syndrome)   . Insomnia   . Multiple rib fractures 10/2008   bilateral  . Pleural effusion, left   . Pruritus   . Pulmonary nodule   . Syncope    Past Surgical History:  Procedure Laterality Date  . APPENDECTOMY    . BLADDER SUSPENSION  2005   A-P  . CHOLECYSTECTOMY    . ORIF WRIST FRACTURE Left 2010     Dr. Burney Gauze.  Marland Kitchen POLYPECTOMY    . ROTATOR CUFF REPAIR Right 2006  . TONSILLECTOMY    . TONSILLECTOMY    . TOTAL KNEE ARTHROPLASTY Right 08/2010    Allergies  Allergen Reactions  . Albuterol Sulfate Palpitations  .  Levaquin [Levofloxacin In D5w] Other (See Comments)    QT prolongation. Should avoid all flouroquinolones.   . Lactose Intolerance (Gi) Other (See Comments)  . Amoxicillin Other (See Comments)    REACTION: unspecified  . Codeine Other (See Comments)    Can take Hydrocodone  . Penicillins Hives, Itching and Rash    Hard lump and rash Has patient had a PCN reaction causing immediate rash, facial/tongue/throat swelling, SOB or lightheadedness with hypotension: Yes Has patient had a PCN reaction causing severe rash involving mucus membranes or skin necrosis: No Has patient had a PCN reaction that required hospitalization unknown Has patient had a PCN reaction occurring  within the last 10 years: No If all of the above answers are "NO", then may proceed with Cephalosporin use.  . Sulfa Antibiotics Nausea Only  . Sulfonamide Derivatives Nausea Only    Outpatient Encounter Medications as of 07/31/2017  Medication Sig  . acetaminophen (TYLENOL) 500 MG tablet Take 1,000 mg by mouth 2 (two) times daily as needed for headache (pain). Not to exceed 3000mg  daily  . aspirin EC 81 MG tablet Take 1 tablet (81 mg total) by mouth daily.  . bimatoprost (LUMIGAN) 0.01 % SOLN Place 1 drop into both eyes at bedtime.  . cetirizine (ZYRTEC) 10 MG tablet Take 10 mg by mouth daily.  . clonazePAM (KLONOPIN) 0.5 MG tablet Take 0.5 tablets (0.25 mg total) by mouth 3 (three) times daily as needed (anxiety).  Marland Kitchen dicyclomine (BENTYL) 10 MG capsule Take 1 capsule (10 mg total) by mouth 3 (three) times daily before meals. IBS  . diltiazem (CARDIZEM CD) 360 MG 24 hr capsule Take 1 capsule (360 mg total) by mouth daily.  . Emollient (EUCERIN) lotion Apply topically as needed for dry skin.  . furosemide (LASIX) 20 MG tablet Take 1 tablet (20 mg total) by mouth daily.  . hydroxypropyl methylcellulose / hypromellose (ISOPTO TEARS / GONIOVISC) 2.5 % ophthalmic solution Place 1 drop into both eyes 3 (three) times daily.  Marland Kitchen ipratropium (ATROVENT) 0.02 % nebulizer solution Take 0.5 mg by nebulization 2 (two) times daily.  . iron polysaccharides (NIFEREX) 150 MG capsule Take 150 mg by mouth daily.  Marland Kitchen levalbuterol (XOPENEX) 1.25 MG/0.5ML nebulizer solution Take 1.25 mg by nebulization every 6 (six) hours as needed for wheezing or shortness of breath.  . loperamide (IMODIUM) 2 MG capsule Take 2 mg by mouth as needed for diarrhea or loose stools.   Marland Kitchen losartan (COZAAR) 50 MG tablet Take 1 tablet (50 mg total) by mouth 2 (two) times daily.  . magnesium oxide (MAG-OX) 400 MG tablet Take 400 mg by mouth daily as needed (leg cramps).  . metoprolol tartrate (LOPRESSOR) 50 MG tablet Take 1 tablet (50 mg  total) by mouth 2 (two) times daily.  . Multiple Vitamin (MULTIVITAMIN WITH MINERALS) TABS tablet Take 1 tablet by mouth daily.  Marland Kitchen oxycodone (OXY-IR) 5 MG capsule Take 5 mg by mouth every 4 (four) hours as needed.  . zolpidem (AMBIEN) 5 MG tablet Take 1 tablet (5 mg total) by mouth at bedtime as needed for sleep.  . [DISCONTINUED] Nutritional Supplements (FEEDING SUPPLEMENT, BOOST BREEZE,) LIQD Take 1 Can by mouth 2 (two) times daily.   No facility-administered encounter medications on file as of 07/31/2017.     Review of Systems  Constitutional: Positive for unexpected weight change. Negative for activity change, appetite change, chills, diaphoresis, fatigue and fever.  HENT: Negative for congestion.   Respiratory: Positive for cough (dry) and shortness of  breath (with exertion). Negative for wheezing.   Cardiovascular: Positive for leg swelling. Negative for chest pain and palpitations.  Gastrointestinal: Negative for abdominal distention, abdominal pain, constipation and diarrhea.  Genitourinary: Negative for difficulty urinating and dysuria.  Musculoskeletal: Positive for gait problem (uses walker). Negative for arthralgias, back pain, joint swelling and myalgias.  Neurological: Negative for dizziness, tremors, seizures, syncope, facial asymmetry, speech difficulty, weakness, light-headedness, numbness and headaches.  Psychiatric/Behavioral: Negative for agitation, behavioral problems and confusion.    Immunization History  Administered Date(s) Administered  . H1N1 09/30/2008  . Influenza Split 05/30/2012, 07/01/2013  . Influenza Whole 06/19/2009, 06/20/2011  . Influenza, High Dose Seasonal PF 07/01/2013  . Influenza,inj,Quad PF,6+ Mos 06/05/2014  . Pneumococcal Conjugate-13 08/20/2013  . Pneumococcal Polysaccharide-23 11/18/1992, 08/25/2010  . Tetanus 08/20/2013  . Zoster 09/19/2004   Pertinent  Health Maintenance Due  Topic Date Due  . DEXA SCAN  03/15/1993  . INFLUENZA  VACCINE  04/19/2017  . PNA vac Low Risk Adult  Completed   Fall Risk  06/06/2014  Falls in the past year? No   Functional Status Survey:    Vitals:   07/31/17 1040  BP: (!) 143/65  Pulse: 71  Resp: 18  Temp: (!) 97.5 F (36.4 C)  SpO2: 96%  Weight: 133 lb (60.3 kg)   Body mass index is 22.13 kg/m. Physical Exam  Constitutional: She is oriented to person, place, and time. No distress.  HENT:  Head: Normocephalic and atraumatic.  Neck: No JVD present.  Cardiovascular: Normal rate and regular rhythm.  No murmur heard. BLE edema +2.    Pulmonary/Chest: Effort normal. No respiratory distress. She has wheezes (slight exp to RUL).  Decreased bases, worse on the right  Abdominal: Soft. Bowel sounds are normal.  Neurological: She is alert and oriented to person, place, and time.  Skin: Skin is warm and dry. She is not diaphoretic.  Healing skin tear to left ant shin   Psychiatric: She has a normal mood and affect.    Labs reviewed: Recent Labs    12/25/16 1550  03/02/17 1504  03/05/17 0326 03/06/17 0226 03/07/17 0333 03/14/17 1238 04/06/17  NA 138   < > 138   < > 137 133* 135 140 138  K 3.7   < > 3.3*   < > 3.2* 3.3* 3.7 4.0 4.5  CL 106   < > 108   < > 101 98* 101  --   --   CO2 23   < > 23   < > 29 27 25 23   --   GLUCOSE 124*   < > 126*   < > 80 94 84 126  --   BUN 23*   < > 24*   < > 9 15 18  19.6 16  CREATININE 0.76  0.77   < > 0.81   < > 0.74 0.67 0.67 0.8 0.8  CALCIUM 9.1   < > 8.4*   < > 7.9* 7.8* 7.9* 9.2  --   MG 2.1  --  2.0  --   --   --   --   --   --    < > = values in this interval not displayed.   Recent Labs    03/06/17 0226 03/07/17 0333 03/14/17 1238  04/12/17 0700 04/17/17 04/17/17 0120  AST 14* 17 12   < > 32 15 15  ALT 15 17 15    < > 85* 25 25  ALKPHOS 48 52 67   < >  333* 178* 178*  BILITOT 0.8 0.8 0.47  --   --   --   --   PROT 4.8* 5.0* 6.2*  --   --   --   --   ALBUMIN 2.7* 2.8* 3.2*  --   --   --   --    < > = values in this  interval not displayed.   Recent Labs    03/02/17 1504  03/07/17 0333 03/14/17 1238 03/30/17 1452  04/12/17 0700 04/17/17 0120 05/01/17  WBC 5.9   < > 4.4 3.1* 1.8*   < > 2.2 5.5 1.9  NEUTROABS 3.8  --   --  2.1 1.1*  --   --   --   --   HGB 9.8*   < > 8.1* 8.9* 9.0*   < > 7.9* 7.5* 8.3*  HCT 31.5*   < > 26.8* 28.6* 28.1*   < > 25* 22* 24*  MCV 96.6   < > 97.5 96.6 94.0  --   --   --   --   PLT 292   < > 338 352 276   < > 376 421* 495*   < > = values in this interval not displayed.   Lab Results  Component Value Date   TSH 2.712 03/02/2017   Lab Results  Component Value Date   HGBA1C (H) 10/04/2010    5.7 (NOTE)                                                                       According to the ADA Clinical Practice Recommendations for 2011, when HbA1c is used as a screening test:   >=6.5%   Diagnostic of Diabetes Mellitus           (if abnormal result  is confirmed)  5.7-6.4%   Increased risk of developing Diabetes Mellitus  References:Diagnosis and Classification of Diabetes Mellitus,Diabetes WLNL,8921,19(ERDEY 1):S62-S69 and Standards of Medical Care in         Diabetes - 2011,Diabetes CXKG,8185,63  (Suppl 1):S11-S61.   Lab Results  Component Value Date   CHOL 247 (H) 06/05/2014   HDL 75.60 06/05/2014   LDLCALC 156 (H) 06/05/2014   LDLDIRECT 157.6 01/16/2012   TRIG 78.0 06/05/2014   CHOLHDL 3 06/05/2014    Significant Diagnostic Results in last 30 days:  No results found.  Assessment/Plan  1. Acute on chronic diastolic CHF (congestive heart failure) (HCC) Increased lasix 40 mg qd for 3 days with Kdur 20 meq for 3 days  Then return to baseline of 20 mg of lasix qd  2. Weight gain Due to #1 Discontinue boost      Family/ staff Communication: discussed with staff/resident  Labs/tests ordered: NA, followed by hospice

## 2017-08-01 DIAGNOSIS — D469 Myelodysplastic syndrome, unspecified: Secondary | ICD-10-CM | POA: Diagnosis not present

## 2017-08-01 DIAGNOSIS — I25810 Atherosclerosis of coronary artery bypass graft(s) without angina pectoris: Secondary | ICD-10-CM | POA: Diagnosis not present

## 2017-08-01 DIAGNOSIS — I5030 Unspecified diastolic (congestive) heart failure: Secondary | ICD-10-CM | POA: Diagnosis not present

## 2017-08-01 DIAGNOSIS — J449 Chronic obstructive pulmonary disease, unspecified: Secondary | ICD-10-CM | POA: Diagnosis not present

## 2017-08-01 DIAGNOSIS — Z961 Presence of intraocular lens: Secondary | ICD-10-CM | POA: Diagnosis not present

## 2017-08-01 DIAGNOSIS — I10 Essential (primary) hypertension: Secondary | ICD-10-CM | POA: Diagnosis not present

## 2017-08-01 DIAGNOSIS — H524 Presbyopia: Secondary | ICD-10-CM | POA: Diagnosis not present

## 2017-08-01 DIAGNOSIS — H401132 Primary open-angle glaucoma, bilateral, moderate stage: Secondary | ICD-10-CM | POA: Diagnosis not present

## 2017-08-01 DIAGNOSIS — I4891 Unspecified atrial fibrillation: Secondary | ICD-10-CM | POA: Diagnosis not present

## 2017-08-08 DIAGNOSIS — I10 Essential (primary) hypertension: Secondary | ICD-10-CM | POA: Diagnosis not present

## 2017-08-08 DIAGNOSIS — I4891 Unspecified atrial fibrillation: Secondary | ICD-10-CM | POA: Diagnosis not present

## 2017-08-08 DIAGNOSIS — I5030 Unspecified diastolic (congestive) heart failure: Secondary | ICD-10-CM | POA: Diagnosis not present

## 2017-08-08 DIAGNOSIS — J449 Chronic obstructive pulmonary disease, unspecified: Secondary | ICD-10-CM | POA: Diagnosis not present

## 2017-08-08 DIAGNOSIS — D469 Myelodysplastic syndrome, unspecified: Secondary | ICD-10-CM | POA: Diagnosis not present

## 2017-08-08 DIAGNOSIS — I25810 Atherosclerosis of coronary artery bypass graft(s) without angina pectoris: Secondary | ICD-10-CM | POA: Diagnosis not present

## 2017-08-08 NOTE — Progress Notes (Signed)
This encounter was created in error - please disregard.

## 2017-08-14 DIAGNOSIS — J449 Chronic obstructive pulmonary disease, unspecified: Secondary | ICD-10-CM | POA: Diagnosis not present

## 2017-08-14 DIAGNOSIS — I5030 Unspecified diastolic (congestive) heart failure: Secondary | ICD-10-CM | POA: Diagnosis not present

## 2017-08-14 DIAGNOSIS — I4891 Unspecified atrial fibrillation: Secondary | ICD-10-CM | POA: Diagnosis not present

## 2017-08-14 DIAGNOSIS — I25810 Atherosclerosis of coronary artery bypass graft(s) without angina pectoris: Secondary | ICD-10-CM | POA: Diagnosis not present

## 2017-08-14 DIAGNOSIS — I10 Essential (primary) hypertension: Secondary | ICD-10-CM | POA: Diagnosis not present

## 2017-08-14 DIAGNOSIS — D469 Myelodysplastic syndrome, unspecified: Secondary | ICD-10-CM | POA: Diagnosis not present

## 2017-08-17 DIAGNOSIS — I5030 Unspecified diastolic (congestive) heart failure: Secondary | ICD-10-CM | POA: Diagnosis not present

## 2017-08-17 DIAGNOSIS — I4891 Unspecified atrial fibrillation: Secondary | ICD-10-CM | POA: Diagnosis not present

## 2017-08-17 DIAGNOSIS — D469 Myelodysplastic syndrome, unspecified: Secondary | ICD-10-CM | POA: Diagnosis not present

## 2017-08-17 DIAGNOSIS — I25810 Atherosclerosis of coronary artery bypass graft(s) without angina pectoris: Secondary | ICD-10-CM | POA: Diagnosis not present

## 2017-08-17 DIAGNOSIS — J449 Chronic obstructive pulmonary disease, unspecified: Secondary | ICD-10-CM | POA: Diagnosis not present

## 2017-08-17 DIAGNOSIS — I10 Essential (primary) hypertension: Secondary | ICD-10-CM | POA: Diagnosis not present

## 2017-08-19 DIAGNOSIS — I25810 Atherosclerosis of coronary artery bypass graft(s) without angina pectoris: Secondary | ICD-10-CM | POA: Diagnosis not present

## 2017-08-19 DIAGNOSIS — J918 Pleural effusion in other conditions classified elsewhere: Secondary | ICD-10-CM | POA: Diagnosis not present

## 2017-08-19 DIAGNOSIS — D469 Myelodysplastic syndrome, unspecified: Secondary | ICD-10-CM | POA: Diagnosis not present

## 2017-08-19 DIAGNOSIS — K831 Obstruction of bile duct: Secondary | ICD-10-CM | POA: Diagnosis not present

## 2017-08-19 DIAGNOSIS — J449 Chronic obstructive pulmonary disease, unspecified: Secondary | ICD-10-CM | POA: Diagnosis not present

## 2017-08-19 DIAGNOSIS — I4891 Unspecified atrial fibrillation: Secondary | ICD-10-CM | POA: Diagnosis not present

## 2017-08-19 DIAGNOSIS — D72818 Other decreased white blood cell count: Secondary | ICD-10-CM | POA: Diagnosis not present

## 2017-08-19 DIAGNOSIS — I5030 Unspecified diastolic (congestive) heart failure: Secondary | ICD-10-CM | POA: Diagnosis not present

## 2017-08-19 DIAGNOSIS — H11149 Conjunctival xerosis, unspecified, unspecified eye: Secondary | ICD-10-CM | POA: Diagnosis not present

## 2017-08-19 DIAGNOSIS — R0902 Hypoxemia: Secondary | ICD-10-CM | POA: Diagnosis not present

## 2017-08-19 DIAGNOSIS — K589 Irritable bowel syndrome without diarrhea: Secondary | ICD-10-CM | POA: Diagnosis not present

## 2017-08-19 DIAGNOSIS — D630 Anemia in neoplastic disease: Secondary | ICD-10-CM | POA: Diagnosis not present

## 2017-08-19 DIAGNOSIS — D473 Essential (hemorrhagic) thrombocythemia: Secondary | ICD-10-CM | POA: Diagnosis not present

## 2017-08-19 DIAGNOSIS — I10 Essential (primary) hypertension: Secondary | ICD-10-CM | POA: Diagnosis not present

## 2017-08-19 DIAGNOSIS — R54 Age-related physical debility: Secondary | ICD-10-CM | POA: Diagnosis not present

## 2017-08-21 ENCOUNTER — Ambulatory Visit: Payer: Medicare Other | Admitting: Nurse Practitioner
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
## 2017-08-22 ENCOUNTER — Encounter: Payer: Self-pay | Admitting: Nurse Practitioner

## 2017-08-24 DIAGNOSIS — I4891 Unspecified atrial fibrillation: Secondary | ICD-10-CM | POA: Diagnosis not present

## 2017-08-24 DIAGNOSIS — I5030 Unspecified diastolic (congestive) heart failure: Secondary | ICD-10-CM | POA: Diagnosis not present

## 2017-08-24 DIAGNOSIS — I10 Essential (primary) hypertension: Secondary | ICD-10-CM | POA: Diagnosis not present

## 2017-08-24 DIAGNOSIS — J449 Chronic obstructive pulmonary disease, unspecified: Secondary | ICD-10-CM | POA: Diagnosis not present

## 2017-08-24 DIAGNOSIS — D469 Myelodysplastic syndrome, unspecified: Secondary | ICD-10-CM | POA: Diagnosis not present

## 2017-08-24 DIAGNOSIS — I25810 Atherosclerosis of coronary artery bypass graft(s) without angina pectoris: Secondary | ICD-10-CM | POA: Diagnosis not present

## 2017-08-25 DIAGNOSIS — J449 Chronic obstructive pulmonary disease, unspecified: Secondary | ICD-10-CM | POA: Diagnosis not present

## 2017-08-25 DIAGNOSIS — D469 Myelodysplastic syndrome, unspecified: Secondary | ICD-10-CM | POA: Diagnosis not present

## 2017-08-25 DIAGNOSIS — I4891 Unspecified atrial fibrillation: Secondary | ICD-10-CM | POA: Diagnosis not present

## 2017-08-25 DIAGNOSIS — I5030 Unspecified diastolic (congestive) heart failure: Secondary | ICD-10-CM | POA: Diagnosis not present

## 2017-08-25 DIAGNOSIS — I25810 Atherosclerosis of coronary artery bypass graft(s) without angina pectoris: Secondary | ICD-10-CM | POA: Diagnosis not present

## 2017-08-25 DIAGNOSIS — I10 Essential (primary) hypertension: Secondary | ICD-10-CM | POA: Diagnosis not present

## 2017-08-30 DIAGNOSIS — J449 Chronic obstructive pulmonary disease, unspecified: Secondary | ICD-10-CM | POA: Diagnosis not present

## 2017-08-30 DIAGNOSIS — I25810 Atherosclerosis of coronary artery bypass graft(s) without angina pectoris: Secondary | ICD-10-CM | POA: Diagnosis not present

## 2017-08-30 DIAGNOSIS — I4891 Unspecified atrial fibrillation: Secondary | ICD-10-CM | POA: Diagnosis not present

## 2017-08-30 DIAGNOSIS — I10 Essential (primary) hypertension: Secondary | ICD-10-CM | POA: Diagnosis not present

## 2017-08-30 DIAGNOSIS — I5030 Unspecified diastolic (congestive) heart failure: Secondary | ICD-10-CM | POA: Diagnosis not present

## 2017-08-30 DIAGNOSIS — D469 Myelodysplastic syndrome, unspecified: Secondary | ICD-10-CM | POA: Diagnosis not present

## 2017-09-08 DIAGNOSIS — I10 Essential (primary) hypertension: Secondary | ICD-10-CM | POA: Diagnosis not present

## 2017-09-08 DIAGNOSIS — I5030 Unspecified diastolic (congestive) heart failure: Secondary | ICD-10-CM | POA: Diagnosis not present

## 2017-09-08 DIAGNOSIS — I25810 Atherosclerosis of coronary artery bypass graft(s) without angina pectoris: Secondary | ICD-10-CM | POA: Diagnosis not present

## 2017-09-08 DIAGNOSIS — I4891 Unspecified atrial fibrillation: Secondary | ICD-10-CM | POA: Diagnosis not present

## 2017-09-08 DIAGNOSIS — J449 Chronic obstructive pulmonary disease, unspecified: Secondary | ICD-10-CM | POA: Diagnosis not present

## 2017-09-08 DIAGNOSIS — D469 Myelodysplastic syndrome, unspecified: Secondary | ICD-10-CM | POA: Diagnosis not present

## 2017-09-13 ENCOUNTER — Other Ambulatory Visit (HOSPITAL_COMMUNITY): Payer: Self-pay | Admitting: Internal Medicine

## 2017-09-13 DIAGNOSIS — I4891 Unspecified atrial fibrillation: Secondary | ICD-10-CM | POA: Diagnosis not present

## 2017-09-13 DIAGNOSIS — D469 Myelodysplastic syndrome, unspecified: Secondary | ICD-10-CM | POA: Diagnosis not present

## 2017-09-13 DIAGNOSIS — I25810 Atherosclerosis of coronary artery bypass graft(s) without angina pectoris: Secondary | ICD-10-CM | POA: Diagnosis not present

## 2017-09-13 DIAGNOSIS — I10 Essential (primary) hypertension: Secondary | ICD-10-CM | POA: Diagnosis not present

## 2017-09-13 DIAGNOSIS — I5030 Unspecified diastolic (congestive) heart failure: Secondary | ICD-10-CM | POA: Diagnosis not present

## 2017-09-13 DIAGNOSIS — R1310 Dysphagia, unspecified: Secondary | ICD-10-CM

## 2017-09-13 DIAGNOSIS — J449 Chronic obstructive pulmonary disease, unspecified: Secondary | ICD-10-CM | POA: Diagnosis not present

## 2017-09-18 ENCOUNTER — Other Ambulatory Visit: Payer: Self-pay | Admitting: Internal Medicine

## 2017-09-18 DIAGNOSIS — R0989 Other specified symptoms and signs involving the circulatory and respiratory systems: Secondary | ICD-10-CM

## 2017-09-19 DIAGNOSIS — R0902 Hypoxemia: Secondary | ICD-10-CM | POA: Diagnosis not present

## 2017-09-19 DIAGNOSIS — D72818 Other decreased white blood cell count: Secondary | ICD-10-CM | POA: Diagnosis not present

## 2017-09-19 DIAGNOSIS — I4891 Unspecified atrial fibrillation: Secondary | ICD-10-CM | POA: Diagnosis not present

## 2017-09-19 DIAGNOSIS — D473 Essential (hemorrhagic) thrombocythemia: Secondary | ICD-10-CM | POA: Diagnosis not present

## 2017-09-19 DIAGNOSIS — J918 Pleural effusion in other conditions classified elsewhere: Secondary | ICD-10-CM | POA: Diagnosis not present

## 2017-09-19 DIAGNOSIS — D630 Anemia in neoplastic disease: Secondary | ICD-10-CM | POA: Diagnosis not present

## 2017-09-19 DIAGNOSIS — I25810 Atherosclerosis of coronary artery bypass graft(s) without angina pectoris: Secondary | ICD-10-CM | POA: Diagnosis not present

## 2017-09-19 DIAGNOSIS — R54 Age-related physical debility: Secondary | ICD-10-CM | POA: Diagnosis not present

## 2017-09-19 DIAGNOSIS — J449 Chronic obstructive pulmonary disease, unspecified: Secondary | ICD-10-CM | POA: Diagnosis not present

## 2017-09-19 DIAGNOSIS — K831 Obstruction of bile duct: Secondary | ICD-10-CM | POA: Diagnosis not present

## 2017-09-19 DIAGNOSIS — I5030 Unspecified diastolic (congestive) heart failure: Secondary | ICD-10-CM | POA: Diagnosis not present

## 2017-09-19 DIAGNOSIS — D469 Myelodysplastic syndrome, unspecified: Secondary | ICD-10-CM | POA: Diagnosis not present

## 2017-09-19 DIAGNOSIS — K589 Irritable bowel syndrome without diarrhea: Secondary | ICD-10-CM | POA: Diagnosis not present

## 2017-09-19 DIAGNOSIS — I10 Essential (primary) hypertension: Secondary | ICD-10-CM | POA: Diagnosis not present

## 2017-09-19 DIAGNOSIS — H11149 Conjunctival xerosis, unspecified, unspecified eye: Secondary | ICD-10-CM | POA: Diagnosis not present

## 2017-09-20 DIAGNOSIS — I5030 Unspecified diastolic (congestive) heart failure: Secondary | ICD-10-CM | POA: Diagnosis not present

## 2017-09-20 DIAGNOSIS — I10 Essential (primary) hypertension: Secondary | ICD-10-CM | POA: Diagnosis not present

## 2017-09-20 DIAGNOSIS — I4891 Unspecified atrial fibrillation: Secondary | ICD-10-CM | POA: Diagnosis not present

## 2017-09-20 DIAGNOSIS — J449 Chronic obstructive pulmonary disease, unspecified: Secondary | ICD-10-CM | POA: Diagnosis not present

## 2017-09-20 DIAGNOSIS — I25810 Atherosclerosis of coronary artery bypass graft(s) without angina pectoris: Secondary | ICD-10-CM | POA: Diagnosis not present

## 2017-09-20 DIAGNOSIS — D469 Myelodysplastic syndrome, unspecified: Secondary | ICD-10-CM | POA: Diagnosis not present

## 2017-09-27 ENCOUNTER — Ambulatory Visit (HOSPITAL_COMMUNITY)
Admission: RE | Admit: 2017-09-27 | Discharge: 2017-09-27 | Disposition: A | Discharge status: Home / Self Care | Payer: Medicare Other | Source: Ambulatory Visit | Attending: Internal Medicine | Admitting: Internal Medicine

## 2017-09-27 DIAGNOSIS — K222 Esophageal obstruction: Secondary | ICD-10-CM | POA: Diagnosis not present

## 2017-09-27 DIAGNOSIS — K449 Diaphragmatic hernia without obstruction or gangrene: Secondary | ICD-10-CM | POA: Diagnosis not present

## 2017-09-27 DIAGNOSIS — R1310 Dysphagia, unspecified: Secondary | ICD-10-CM

## 2017-09-27 DIAGNOSIS — R0989 Other specified symptoms and signs involving the circulatory and respiratory systems: Secondary | ICD-10-CM

## 2017-09-29 DIAGNOSIS — C946 Myelodysplastic disease, not classified: Secondary | ICD-10-CM | POA: Diagnosis not present

## 2017-09-29 DIAGNOSIS — R2681 Unsteadiness on feet: Secondary | ICD-10-CM | POA: Diagnosis not present

## 2017-09-29 DIAGNOSIS — I10 Essential (primary) hypertension: Secondary | ICD-10-CM | POA: Diagnosis not present

## 2017-09-29 DIAGNOSIS — Z6822 Body mass index (BMI) 22.0-22.9, adult: Secondary | ICD-10-CM | POA: Diagnosis not present

## 2017-09-29 DIAGNOSIS — J449 Chronic obstructive pulmonary disease, unspecified: Secondary | ICD-10-CM | POA: Diagnosis not present

## 2017-09-29 DIAGNOSIS — I5032 Chronic diastolic (congestive) heart failure: Secondary | ICD-10-CM | POA: Diagnosis not present

## 2017-09-29 DIAGNOSIS — D469 Myelodysplastic syndrome, unspecified: Secondary | ICD-10-CM | POA: Diagnosis not present

## 2017-09-29 DIAGNOSIS — F418 Other specified anxiety disorders: Secondary | ICD-10-CM | POA: Diagnosis not present

## 2017-09-29 DIAGNOSIS — I480 Paroxysmal atrial fibrillation: Secondary | ICD-10-CM | POA: Diagnosis not present

## 2017-09-29 DIAGNOSIS — I5030 Unspecified diastolic (congestive) heart failure: Secondary | ICD-10-CM | POA: Diagnosis not present

## 2017-09-29 DIAGNOSIS — I4891 Unspecified atrial fibrillation: Secondary | ICD-10-CM | POA: Diagnosis not present

## 2017-09-29 DIAGNOSIS — I25810 Atherosclerosis of coronary artery bypass graft(s) without angina pectoris: Secondary | ICD-10-CM | POA: Diagnosis not present

## 2017-09-29 DIAGNOSIS — H9193 Unspecified hearing loss, bilateral: Secondary | ICD-10-CM | POA: Diagnosis not present

## 2017-10-04 DIAGNOSIS — I25810 Atherosclerosis of coronary artery bypass graft(s) without angina pectoris: Secondary | ICD-10-CM | POA: Diagnosis not present

## 2017-10-04 DIAGNOSIS — I5030 Unspecified diastolic (congestive) heart failure: Secondary | ICD-10-CM | POA: Diagnosis not present

## 2017-10-04 DIAGNOSIS — J449 Chronic obstructive pulmonary disease, unspecified: Secondary | ICD-10-CM | POA: Diagnosis not present

## 2017-10-04 DIAGNOSIS — I4891 Unspecified atrial fibrillation: Secondary | ICD-10-CM | POA: Diagnosis not present

## 2017-10-04 DIAGNOSIS — I10 Essential (primary) hypertension: Secondary | ICD-10-CM | POA: Diagnosis not present

## 2017-10-04 DIAGNOSIS — D469 Myelodysplastic syndrome, unspecified: Secondary | ICD-10-CM | POA: Diagnosis not present

## 2017-10-11 DIAGNOSIS — I4891 Unspecified atrial fibrillation: Secondary | ICD-10-CM | POA: Diagnosis not present

## 2017-10-11 DIAGNOSIS — I25810 Atherosclerosis of coronary artery bypass graft(s) without angina pectoris: Secondary | ICD-10-CM | POA: Diagnosis not present

## 2017-10-11 DIAGNOSIS — J449 Chronic obstructive pulmonary disease, unspecified: Secondary | ICD-10-CM | POA: Diagnosis not present

## 2017-10-11 DIAGNOSIS — I10 Essential (primary) hypertension: Secondary | ICD-10-CM | POA: Diagnosis not present

## 2017-10-11 DIAGNOSIS — D469 Myelodysplastic syndrome, unspecified: Secondary | ICD-10-CM | POA: Diagnosis not present

## 2017-10-11 DIAGNOSIS — I5030 Unspecified diastolic (congestive) heart failure: Secondary | ICD-10-CM | POA: Diagnosis not present

## 2017-10-12 DIAGNOSIS — I4891 Unspecified atrial fibrillation: Secondary | ICD-10-CM | POA: Diagnosis not present

## 2017-10-12 DIAGNOSIS — I25810 Atherosclerosis of coronary artery bypass graft(s) without angina pectoris: Secondary | ICD-10-CM | POA: Diagnosis not present

## 2017-10-12 DIAGNOSIS — I10 Essential (primary) hypertension: Secondary | ICD-10-CM | POA: Diagnosis not present

## 2017-10-12 DIAGNOSIS — D469 Myelodysplastic syndrome, unspecified: Secondary | ICD-10-CM | POA: Diagnosis not present

## 2017-10-12 DIAGNOSIS — I5030 Unspecified diastolic (congestive) heart failure: Secondary | ICD-10-CM | POA: Diagnosis not present

## 2017-10-12 DIAGNOSIS — J449 Chronic obstructive pulmonary disease, unspecified: Secondary | ICD-10-CM | POA: Diagnosis not present

## 2017-10-20 DIAGNOSIS — I4891 Unspecified atrial fibrillation: Secondary | ICD-10-CM | POA: Diagnosis not present

## 2017-10-20 DIAGNOSIS — D630 Anemia in neoplastic disease: Secondary | ICD-10-CM | POA: Diagnosis not present

## 2017-10-20 DIAGNOSIS — R54 Age-related physical debility: Secondary | ICD-10-CM | POA: Diagnosis not present

## 2017-10-20 DIAGNOSIS — I5030 Unspecified diastolic (congestive) heart failure: Secondary | ICD-10-CM | POA: Diagnosis not present

## 2017-10-20 DIAGNOSIS — D473 Essential (hemorrhagic) thrombocythemia: Secondary | ICD-10-CM | POA: Diagnosis not present

## 2017-10-20 DIAGNOSIS — J918 Pleural effusion in other conditions classified elsewhere: Secondary | ICD-10-CM | POA: Diagnosis not present

## 2017-10-20 DIAGNOSIS — I10 Essential (primary) hypertension: Secondary | ICD-10-CM | POA: Diagnosis not present

## 2017-10-20 DIAGNOSIS — K219 Gastro-esophageal reflux disease without esophagitis: Secondary | ICD-10-CM | POA: Diagnosis not present

## 2017-10-20 DIAGNOSIS — J449 Chronic obstructive pulmonary disease, unspecified: Secondary | ICD-10-CM | POA: Diagnosis not present

## 2017-10-20 DIAGNOSIS — D72818 Other decreased white blood cell count: Secondary | ICD-10-CM | POA: Diagnosis not present

## 2017-10-20 DIAGNOSIS — R0902 Hypoxemia: Secondary | ICD-10-CM | POA: Diagnosis not present

## 2017-10-20 DIAGNOSIS — K589 Irritable bowel syndrome without diarrhea: Secondary | ICD-10-CM | POA: Diagnosis not present

## 2017-10-20 DIAGNOSIS — I25810 Atherosclerosis of coronary artery bypass graft(s) without angina pectoris: Secondary | ICD-10-CM | POA: Diagnosis not present

## 2017-10-20 DIAGNOSIS — K831 Obstruction of bile duct: Secondary | ICD-10-CM | POA: Diagnosis not present

## 2017-10-20 DIAGNOSIS — H11149 Conjunctival xerosis, unspecified, unspecified eye: Secondary | ICD-10-CM | POA: Diagnosis not present

## 2017-10-20 DIAGNOSIS — D469 Myelodysplastic syndrome, unspecified: Secondary | ICD-10-CM | POA: Diagnosis not present

## 2017-10-24 DIAGNOSIS — I10 Essential (primary) hypertension: Secondary | ICD-10-CM | POA: Diagnosis not present

## 2017-10-24 DIAGNOSIS — I5030 Unspecified diastolic (congestive) heart failure: Secondary | ICD-10-CM | POA: Diagnosis not present

## 2017-10-24 DIAGNOSIS — D469 Myelodysplastic syndrome, unspecified: Secondary | ICD-10-CM | POA: Diagnosis not present

## 2017-10-24 DIAGNOSIS — I25810 Atherosclerosis of coronary artery bypass graft(s) without angina pectoris: Secondary | ICD-10-CM | POA: Diagnosis not present

## 2017-10-24 DIAGNOSIS — I4891 Unspecified atrial fibrillation: Secondary | ICD-10-CM | POA: Diagnosis not present

## 2017-10-24 DIAGNOSIS — J449 Chronic obstructive pulmonary disease, unspecified: Secondary | ICD-10-CM | POA: Diagnosis not present

## 2017-10-25 DIAGNOSIS — D469 Myelodysplastic syndrome, unspecified: Secondary | ICD-10-CM | POA: Diagnosis not present

## 2017-10-25 DIAGNOSIS — I25810 Atherosclerosis of coronary artery bypass graft(s) without angina pectoris: Secondary | ICD-10-CM | POA: Diagnosis not present

## 2017-10-25 DIAGNOSIS — I4891 Unspecified atrial fibrillation: Secondary | ICD-10-CM | POA: Diagnosis not present

## 2017-10-25 DIAGNOSIS — I10 Essential (primary) hypertension: Secondary | ICD-10-CM | POA: Diagnosis not present

## 2017-10-25 DIAGNOSIS — J449 Chronic obstructive pulmonary disease, unspecified: Secondary | ICD-10-CM | POA: Diagnosis not present

## 2017-10-25 DIAGNOSIS — I5030 Unspecified diastolic (congestive) heart failure: Secondary | ICD-10-CM | POA: Diagnosis not present

## 2017-11-01 DIAGNOSIS — G4483 Primary cough headache: Secondary | ICD-10-CM | POA: Diagnosis not present

## 2017-11-01 DIAGNOSIS — D469 Myelodysplastic syndrome, unspecified: Secondary | ICD-10-CM | POA: Diagnosis not present

## 2017-11-01 DIAGNOSIS — I5030 Unspecified diastolic (congestive) heart failure: Secondary | ICD-10-CM | POA: Diagnosis not present

## 2017-11-01 DIAGNOSIS — R05 Cough: Secondary | ICD-10-CM | POA: Diagnosis not present

## 2017-11-01 DIAGNOSIS — I10 Essential (primary) hypertension: Secondary | ICD-10-CM | POA: Diagnosis not present

## 2017-11-01 DIAGNOSIS — J449 Chronic obstructive pulmonary disease, unspecified: Secondary | ICD-10-CM | POA: Diagnosis not present

## 2017-11-01 DIAGNOSIS — I4891 Unspecified atrial fibrillation: Secondary | ICD-10-CM | POA: Diagnosis not present

## 2017-11-01 DIAGNOSIS — I25810 Atherosclerosis of coronary artery bypass graft(s) without angina pectoris: Secondary | ICD-10-CM | POA: Diagnosis not present

## 2017-11-02 DIAGNOSIS — I5030 Unspecified diastolic (congestive) heart failure: Secondary | ICD-10-CM | POA: Diagnosis not present

## 2017-11-02 DIAGNOSIS — I4891 Unspecified atrial fibrillation: Secondary | ICD-10-CM | POA: Diagnosis not present

## 2017-11-02 DIAGNOSIS — I10 Essential (primary) hypertension: Secondary | ICD-10-CM | POA: Diagnosis not present

## 2017-11-02 DIAGNOSIS — I25810 Atherosclerosis of coronary artery bypass graft(s) without angina pectoris: Secondary | ICD-10-CM | POA: Diagnosis not present

## 2017-11-02 DIAGNOSIS — D469 Myelodysplastic syndrome, unspecified: Secondary | ICD-10-CM | POA: Diagnosis not present

## 2017-11-02 DIAGNOSIS — J449 Chronic obstructive pulmonary disease, unspecified: Secondary | ICD-10-CM | POA: Diagnosis not present

## 2017-11-07 DIAGNOSIS — I25810 Atherosclerosis of coronary artery bypass graft(s) without angina pectoris: Secondary | ICD-10-CM | POA: Diagnosis not present

## 2017-11-07 DIAGNOSIS — I5030 Unspecified diastolic (congestive) heart failure: Secondary | ICD-10-CM | POA: Diagnosis not present

## 2017-11-07 DIAGNOSIS — D469 Myelodysplastic syndrome, unspecified: Secondary | ICD-10-CM | POA: Diagnosis not present

## 2017-11-07 DIAGNOSIS — J449 Chronic obstructive pulmonary disease, unspecified: Secondary | ICD-10-CM | POA: Diagnosis not present

## 2017-11-07 DIAGNOSIS — I10 Essential (primary) hypertension: Secondary | ICD-10-CM | POA: Diagnosis not present

## 2017-11-07 DIAGNOSIS — I4891 Unspecified atrial fibrillation: Secondary | ICD-10-CM | POA: Diagnosis not present

## 2017-11-08 DIAGNOSIS — J449 Chronic obstructive pulmonary disease, unspecified: Secondary | ICD-10-CM | POA: Diagnosis not present

## 2017-11-08 DIAGNOSIS — I4891 Unspecified atrial fibrillation: Secondary | ICD-10-CM | POA: Diagnosis not present

## 2017-11-08 DIAGNOSIS — I5030 Unspecified diastolic (congestive) heart failure: Secondary | ICD-10-CM | POA: Diagnosis not present

## 2017-11-08 DIAGNOSIS — D469 Myelodysplastic syndrome, unspecified: Secondary | ICD-10-CM | POA: Diagnosis not present

## 2017-11-08 DIAGNOSIS — I25810 Atherosclerosis of coronary artery bypass graft(s) without angina pectoris: Secondary | ICD-10-CM | POA: Diagnosis not present

## 2017-11-08 DIAGNOSIS — I10 Essential (primary) hypertension: Secondary | ICD-10-CM | POA: Diagnosis not present

## 2017-11-14 DIAGNOSIS — I25810 Atherosclerosis of coronary artery bypass graft(s) without angina pectoris: Secondary | ICD-10-CM | POA: Diagnosis not present

## 2017-11-14 DIAGNOSIS — J449 Chronic obstructive pulmonary disease, unspecified: Secondary | ICD-10-CM | POA: Diagnosis not present

## 2017-11-14 DIAGNOSIS — D469 Myelodysplastic syndrome, unspecified: Secondary | ICD-10-CM | POA: Diagnosis not present

## 2017-11-14 DIAGNOSIS — I5030 Unspecified diastolic (congestive) heart failure: Secondary | ICD-10-CM | POA: Diagnosis not present

## 2017-11-14 DIAGNOSIS — I4891 Unspecified atrial fibrillation: Secondary | ICD-10-CM | POA: Diagnosis not present

## 2017-11-14 DIAGNOSIS — I10 Essential (primary) hypertension: Secondary | ICD-10-CM | POA: Diagnosis not present

## 2017-11-17 DIAGNOSIS — D473 Essential (hemorrhagic) thrombocythemia: Secondary | ICD-10-CM | POA: Diagnosis not present

## 2017-11-17 DIAGNOSIS — K219 Gastro-esophageal reflux disease without esophagitis: Secondary | ICD-10-CM | POA: Diagnosis not present

## 2017-11-17 DIAGNOSIS — I25810 Atherosclerosis of coronary artery bypass graft(s) without angina pectoris: Secondary | ICD-10-CM | POA: Diagnosis not present

## 2017-11-17 DIAGNOSIS — K589 Irritable bowel syndrome without diarrhea: Secondary | ICD-10-CM | POA: Diagnosis not present

## 2017-11-17 DIAGNOSIS — H11149 Conjunctival xerosis, unspecified, unspecified eye: Secondary | ICD-10-CM | POA: Diagnosis not present

## 2017-11-17 DIAGNOSIS — I4891 Unspecified atrial fibrillation: Secondary | ICD-10-CM | POA: Diagnosis not present

## 2017-11-17 DIAGNOSIS — K831 Obstruction of bile duct: Secondary | ICD-10-CM | POA: Diagnosis not present

## 2017-11-17 DIAGNOSIS — J449 Chronic obstructive pulmonary disease, unspecified: Secondary | ICD-10-CM | POA: Diagnosis not present

## 2017-11-17 DIAGNOSIS — I10 Essential (primary) hypertension: Secondary | ICD-10-CM | POA: Diagnosis not present

## 2017-11-17 DIAGNOSIS — R0902 Hypoxemia: Secondary | ICD-10-CM | POA: Diagnosis not present

## 2017-11-17 DIAGNOSIS — D469 Myelodysplastic syndrome, unspecified: Secondary | ICD-10-CM | POA: Diagnosis not present

## 2017-11-17 DIAGNOSIS — R54 Age-related physical debility: Secondary | ICD-10-CM | POA: Diagnosis not present

## 2017-11-17 DIAGNOSIS — D630 Anemia in neoplastic disease: Secondary | ICD-10-CM | POA: Diagnosis not present

## 2017-11-17 DIAGNOSIS — D72818 Other decreased white blood cell count: Secondary | ICD-10-CM | POA: Diagnosis not present

## 2017-11-17 DIAGNOSIS — I5030 Unspecified diastolic (congestive) heart failure: Secondary | ICD-10-CM | POA: Diagnosis not present

## 2017-11-17 DIAGNOSIS — J918 Pleural effusion in other conditions classified elsewhere: Secondary | ICD-10-CM | POA: Diagnosis not present

## 2017-11-20 DIAGNOSIS — I5030 Unspecified diastolic (congestive) heart failure: Secondary | ICD-10-CM | POA: Diagnosis not present

## 2017-11-20 DIAGNOSIS — I25810 Atherosclerosis of coronary artery bypass graft(s) without angina pectoris: Secondary | ICD-10-CM | POA: Diagnosis not present

## 2017-11-20 DIAGNOSIS — D469 Myelodysplastic syndrome, unspecified: Secondary | ICD-10-CM | POA: Diagnosis not present

## 2017-11-20 DIAGNOSIS — I4891 Unspecified atrial fibrillation: Secondary | ICD-10-CM | POA: Diagnosis not present

## 2017-11-20 DIAGNOSIS — J449 Chronic obstructive pulmonary disease, unspecified: Secondary | ICD-10-CM | POA: Diagnosis not present

## 2017-11-20 DIAGNOSIS — I10 Essential (primary) hypertension: Secondary | ICD-10-CM | POA: Diagnosis not present

## 2017-11-22 DIAGNOSIS — I25810 Atherosclerosis of coronary artery bypass graft(s) without angina pectoris: Secondary | ICD-10-CM | POA: Diagnosis not present

## 2017-11-22 DIAGNOSIS — I5030 Unspecified diastolic (congestive) heart failure: Secondary | ICD-10-CM | POA: Diagnosis not present

## 2017-11-22 DIAGNOSIS — D469 Myelodysplastic syndrome, unspecified: Secondary | ICD-10-CM | POA: Diagnosis not present

## 2017-11-22 DIAGNOSIS — I10 Essential (primary) hypertension: Secondary | ICD-10-CM | POA: Diagnosis not present

## 2017-11-22 DIAGNOSIS — I4891 Unspecified atrial fibrillation: Secondary | ICD-10-CM | POA: Diagnosis not present

## 2017-11-22 DIAGNOSIS — J449 Chronic obstructive pulmonary disease, unspecified: Secondary | ICD-10-CM | POA: Diagnosis not present

## 2017-12-01 DIAGNOSIS — D469 Myelodysplastic syndrome, unspecified: Secondary | ICD-10-CM | POA: Diagnosis not present

## 2017-12-01 DIAGNOSIS — I5030 Unspecified diastolic (congestive) heart failure: Secondary | ICD-10-CM | POA: Diagnosis not present

## 2017-12-01 DIAGNOSIS — I4891 Unspecified atrial fibrillation: Secondary | ICD-10-CM | POA: Diagnosis not present

## 2017-12-01 DIAGNOSIS — I25810 Atherosclerosis of coronary artery bypass graft(s) without angina pectoris: Secondary | ICD-10-CM | POA: Diagnosis not present

## 2017-12-01 DIAGNOSIS — I10 Essential (primary) hypertension: Secondary | ICD-10-CM | POA: Diagnosis not present

## 2017-12-01 DIAGNOSIS — J449 Chronic obstructive pulmonary disease, unspecified: Secondary | ICD-10-CM | POA: Diagnosis not present

## 2017-12-04 DIAGNOSIS — I25810 Atherosclerosis of coronary artery bypass graft(s) without angina pectoris: Secondary | ICD-10-CM | POA: Diagnosis not present

## 2017-12-04 DIAGNOSIS — I5030 Unspecified diastolic (congestive) heart failure: Secondary | ICD-10-CM | POA: Diagnosis not present

## 2017-12-04 DIAGNOSIS — D469 Myelodysplastic syndrome, unspecified: Secondary | ICD-10-CM | POA: Diagnosis not present

## 2017-12-04 DIAGNOSIS — I4891 Unspecified atrial fibrillation: Secondary | ICD-10-CM | POA: Diagnosis not present

## 2017-12-04 DIAGNOSIS — J449 Chronic obstructive pulmonary disease, unspecified: Secondary | ICD-10-CM | POA: Diagnosis not present

## 2017-12-04 DIAGNOSIS — I10 Essential (primary) hypertension: Secondary | ICD-10-CM | POA: Diagnosis not present

## 2017-12-08 DIAGNOSIS — I5030 Unspecified diastolic (congestive) heart failure: Secondary | ICD-10-CM | POA: Diagnosis not present

## 2017-12-08 DIAGNOSIS — I4891 Unspecified atrial fibrillation: Secondary | ICD-10-CM | POA: Diagnosis not present

## 2017-12-08 DIAGNOSIS — D469 Myelodysplastic syndrome, unspecified: Secondary | ICD-10-CM | POA: Diagnosis not present

## 2017-12-08 DIAGNOSIS — I10 Essential (primary) hypertension: Secondary | ICD-10-CM | POA: Diagnosis not present

## 2017-12-08 DIAGNOSIS — J449 Chronic obstructive pulmonary disease, unspecified: Secondary | ICD-10-CM | POA: Diagnosis not present

## 2017-12-08 DIAGNOSIS — I25810 Atherosclerosis of coronary artery bypass graft(s) without angina pectoris: Secondary | ICD-10-CM | POA: Diagnosis not present

## 2017-12-15 DIAGNOSIS — I10 Essential (primary) hypertension: Secondary | ICD-10-CM | POA: Diagnosis not present

## 2017-12-15 DIAGNOSIS — I4891 Unspecified atrial fibrillation: Secondary | ICD-10-CM | POA: Diagnosis not present

## 2017-12-15 DIAGNOSIS — J449 Chronic obstructive pulmonary disease, unspecified: Secondary | ICD-10-CM | POA: Diagnosis not present

## 2017-12-15 DIAGNOSIS — I25810 Atherosclerosis of coronary artery bypass graft(s) without angina pectoris: Secondary | ICD-10-CM | POA: Diagnosis not present

## 2017-12-15 DIAGNOSIS — I5030 Unspecified diastolic (congestive) heart failure: Secondary | ICD-10-CM | POA: Diagnosis not present

## 2017-12-15 DIAGNOSIS — D469 Myelodysplastic syndrome, unspecified: Secondary | ICD-10-CM | POA: Diagnosis not present

## 2017-12-18 ENCOUNTER — Encounter: Payer: Self-pay | Admitting: Internal Medicine

## 2017-12-18 DIAGNOSIS — I4891 Unspecified atrial fibrillation: Secondary | ICD-10-CM | POA: Diagnosis not present

## 2017-12-18 DIAGNOSIS — R54 Age-related physical debility: Secondary | ICD-10-CM | POA: Diagnosis not present

## 2017-12-18 DIAGNOSIS — D630 Anemia in neoplastic disease: Secondary | ICD-10-CM | POA: Diagnosis not present

## 2017-12-18 DIAGNOSIS — K831 Obstruction of bile duct: Secondary | ICD-10-CM | POA: Diagnosis not present

## 2017-12-18 DIAGNOSIS — K219 Gastro-esophageal reflux disease without esophagitis: Secondary | ICD-10-CM | POA: Diagnosis not present

## 2017-12-18 DIAGNOSIS — J918 Pleural effusion in other conditions classified elsewhere: Secondary | ICD-10-CM | POA: Diagnosis not present

## 2017-12-18 DIAGNOSIS — R0902 Hypoxemia: Secondary | ICD-10-CM | POA: Diagnosis not present

## 2017-12-18 DIAGNOSIS — I5030 Unspecified diastolic (congestive) heart failure: Secondary | ICD-10-CM | POA: Diagnosis not present

## 2017-12-18 DIAGNOSIS — J449 Chronic obstructive pulmonary disease, unspecified: Secondary | ICD-10-CM | POA: Diagnosis not present

## 2017-12-18 DIAGNOSIS — K589 Irritable bowel syndrome without diarrhea: Secondary | ICD-10-CM | POA: Diagnosis not present

## 2017-12-18 DIAGNOSIS — I25810 Atherosclerosis of coronary artery bypass graft(s) without angina pectoris: Secondary | ICD-10-CM | POA: Diagnosis not present

## 2017-12-18 DIAGNOSIS — D72818 Other decreased white blood cell count: Secondary | ICD-10-CM | POA: Diagnosis not present

## 2017-12-18 DIAGNOSIS — H11149 Conjunctival xerosis, unspecified, unspecified eye: Secondary | ICD-10-CM | POA: Diagnosis not present

## 2017-12-18 DIAGNOSIS — D473 Essential (hemorrhagic) thrombocythemia: Secondary | ICD-10-CM | POA: Diagnosis not present

## 2017-12-18 DIAGNOSIS — I10 Essential (primary) hypertension: Secondary | ICD-10-CM | POA: Diagnosis not present

## 2017-12-18 DIAGNOSIS — D469 Myelodysplastic syndrome, unspecified: Secondary | ICD-10-CM | POA: Diagnosis not present

## 2017-12-19 DIAGNOSIS — D469 Myelodysplastic syndrome, unspecified: Secondary | ICD-10-CM | POA: Diagnosis not present

## 2017-12-19 DIAGNOSIS — J449 Chronic obstructive pulmonary disease, unspecified: Secondary | ICD-10-CM | POA: Diagnosis not present

## 2017-12-19 DIAGNOSIS — I10 Essential (primary) hypertension: Secondary | ICD-10-CM | POA: Diagnosis not present

## 2017-12-19 DIAGNOSIS — I25810 Atherosclerosis of coronary artery bypass graft(s) without angina pectoris: Secondary | ICD-10-CM | POA: Diagnosis not present

## 2017-12-19 DIAGNOSIS — I5030 Unspecified diastolic (congestive) heart failure: Secondary | ICD-10-CM | POA: Diagnosis not present

## 2017-12-19 DIAGNOSIS — I4891 Unspecified atrial fibrillation: Secondary | ICD-10-CM | POA: Diagnosis not present

## 2017-12-20 DIAGNOSIS — J449 Chronic obstructive pulmonary disease, unspecified: Secondary | ICD-10-CM | POA: Diagnosis not present

## 2017-12-20 DIAGNOSIS — D469 Myelodysplastic syndrome, unspecified: Secondary | ICD-10-CM | POA: Diagnosis not present

## 2017-12-20 DIAGNOSIS — I5030 Unspecified diastolic (congestive) heart failure: Secondary | ICD-10-CM | POA: Diagnosis not present

## 2017-12-20 DIAGNOSIS — I10 Essential (primary) hypertension: Secondary | ICD-10-CM | POA: Diagnosis not present

## 2017-12-20 DIAGNOSIS — I4891 Unspecified atrial fibrillation: Secondary | ICD-10-CM | POA: Diagnosis not present

## 2017-12-20 DIAGNOSIS — I25810 Atherosclerosis of coronary artery bypass graft(s) without angina pectoris: Secondary | ICD-10-CM | POA: Diagnosis not present

## 2017-12-25 DIAGNOSIS — I5030 Unspecified diastolic (congestive) heart failure: Secondary | ICD-10-CM | POA: Diagnosis not present

## 2017-12-25 DIAGNOSIS — D469 Myelodysplastic syndrome, unspecified: Secondary | ICD-10-CM | POA: Diagnosis not present

## 2017-12-25 DIAGNOSIS — I4891 Unspecified atrial fibrillation: Secondary | ICD-10-CM | POA: Diagnosis not present

## 2017-12-25 DIAGNOSIS — I25810 Atherosclerosis of coronary artery bypass graft(s) without angina pectoris: Secondary | ICD-10-CM | POA: Diagnosis not present

## 2017-12-25 DIAGNOSIS — J449 Chronic obstructive pulmonary disease, unspecified: Secondary | ICD-10-CM | POA: Diagnosis not present

## 2017-12-25 DIAGNOSIS — I10 Essential (primary) hypertension: Secondary | ICD-10-CM | POA: Diagnosis not present

## 2018-01-02 DIAGNOSIS — I10 Essential (primary) hypertension: Secondary | ICD-10-CM | POA: Diagnosis not present

## 2018-01-02 DIAGNOSIS — I25810 Atherosclerosis of coronary artery bypass graft(s) without angina pectoris: Secondary | ICD-10-CM | POA: Diagnosis not present

## 2018-01-02 DIAGNOSIS — I4891 Unspecified atrial fibrillation: Secondary | ICD-10-CM | POA: Diagnosis not present

## 2018-01-02 DIAGNOSIS — J449 Chronic obstructive pulmonary disease, unspecified: Secondary | ICD-10-CM | POA: Diagnosis not present

## 2018-01-02 DIAGNOSIS — I5030 Unspecified diastolic (congestive) heart failure: Secondary | ICD-10-CM | POA: Diagnosis not present

## 2018-01-02 DIAGNOSIS — D469 Myelodysplastic syndrome, unspecified: Secondary | ICD-10-CM | POA: Diagnosis not present

## 2018-01-04 DIAGNOSIS — I5030 Unspecified diastolic (congestive) heart failure: Secondary | ICD-10-CM | POA: Diagnosis not present

## 2018-01-04 DIAGNOSIS — J449 Chronic obstructive pulmonary disease, unspecified: Secondary | ICD-10-CM | POA: Diagnosis not present

## 2018-01-04 DIAGNOSIS — D469 Myelodysplastic syndrome, unspecified: Secondary | ICD-10-CM | POA: Diagnosis not present

## 2018-01-04 DIAGNOSIS — I10 Essential (primary) hypertension: Secondary | ICD-10-CM | POA: Diagnosis not present

## 2018-01-04 DIAGNOSIS — I25810 Atherosclerosis of coronary artery bypass graft(s) without angina pectoris: Secondary | ICD-10-CM | POA: Diagnosis not present

## 2018-01-04 DIAGNOSIS — I4891 Unspecified atrial fibrillation: Secondary | ICD-10-CM | POA: Diagnosis not present

## 2018-01-08 DIAGNOSIS — Z961 Presence of intraocular lens: Secondary | ICD-10-CM | POA: Diagnosis not present

## 2018-01-08 DIAGNOSIS — H52203 Unspecified astigmatism, bilateral: Secondary | ICD-10-CM | POA: Diagnosis not present

## 2018-01-08 DIAGNOSIS — H401132 Primary open-angle glaucoma, bilateral, moderate stage: Secondary | ICD-10-CM | POA: Diagnosis not present

## 2018-01-11 DIAGNOSIS — I10 Essential (primary) hypertension: Secondary | ICD-10-CM | POA: Diagnosis not present

## 2018-01-11 DIAGNOSIS — D469 Myelodysplastic syndrome, unspecified: Secondary | ICD-10-CM | POA: Diagnosis not present

## 2018-01-11 DIAGNOSIS — I4891 Unspecified atrial fibrillation: Secondary | ICD-10-CM | POA: Diagnosis not present

## 2018-01-11 DIAGNOSIS — I25810 Atherosclerosis of coronary artery bypass graft(s) without angina pectoris: Secondary | ICD-10-CM | POA: Diagnosis not present

## 2018-01-11 DIAGNOSIS — I5030 Unspecified diastolic (congestive) heart failure: Secondary | ICD-10-CM | POA: Diagnosis not present

## 2018-01-11 DIAGNOSIS — J449 Chronic obstructive pulmonary disease, unspecified: Secondary | ICD-10-CM | POA: Diagnosis not present

## 2018-01-15 DIAGNOSIS — J449 Chronic obstructive pulmonary disease, unspecified: Secondary | ICD-10-CM | POA: Diagnosis not present

## 2018-01-15 DIAGNOSIS — I4891 Unspecified atrial fibrillation: Secondary | ICD-10-CM | POA: Diagnosis not present

## 2018-01-15 DIAGNOSIS — I5030 Unspecified diastolic (congestive) heart failure: Secondary | ICD-10-CM | POA: Diagnosis not present

## 2018-01-15 DIAGNOSIS — I25810 Atherosclerosis of coronary artery bypass graft(s) without angina pectoris: Secondary | ICD-10-CM | POA: Diagnosis not present

## 2018-01-15 DIAGNOSIS — I10 Essential (primary) hypertension: Secondary | ICD-10-CM | POA: Diagnosis not present

## 2018-01-15 DIAGNOSIS — D469 Myelodysplastic syndrome, unspecified: Secondary | ICD-10-CM | POA: Diagnosis not present

## 2018-01-16 DIAGNOSIS — D649 Anemia, unspecified: Secondary | ICD-10-CM | POA: Diagnosis not present

## 2018-01-16 DIAGNOSIS — Z79899 Other long term (current) drug therapy: Secondary | ICD-10-CM | POA: Diagnosis not present

## 2018-01-16 LAB — BASIC METABOLIC PANEL
BUN: 28 — AB (ref 4–21)
Creatinine: 0.8 (ref 0.5–1.1)
Glucose: 81
Potassium: 5.2 (ref 3.4–5.3)
Sodium: 137 (ref 137–147)

## 2018-01-16 LAB — CBC AND DIFFERENTIAL
HCT: 28 — AB (ref 36–46)
Hemoglobin: 9.3 — AB (ref 12.0–16.0)
Platelets: 1028 — AB (ref 150–399)
WBC: 2.6

## 2018-01-16 LAB — HEPATIC FUNCTION PANEL
ALT: 9 (ref 7–35)
AST: 31 (ref 13–35)
Alkaline Phosphatase: 90 (ref 25–125)
Bilirubin, Total: 3

## 2018-01-17 DIAGNOSIS — K219 Gastro-esophageal reflux disease without esophagitis: Secondary | ICD-10-CM | POA: Diagnosis not present

## 2018-01-17 DIAGNOSIS — J449 Chronic obstructive pulmonary disease, unspecified: Secondary | ICD-10-CM | POA: Diagnosis not present

## 2018-01-17 DIAGNOSIS — K831 Obstruction of bile duct: Secondary | ICD-10-CM | POA: Diagnosis not present

## 2018-01-17 DIAGNOSIS — D630 Anemia in neoplastic disease: Secondary | ICD-10-CM | POA: Diagnosis not present

## 2018-01-17 DIAGNOSIS — D72818 Other decreased white blood cell count: Secondary | ICD-10-CM | POA: Diagnosis not present

## 2018-01-17 DIAGNOSIS — I10 Essential (primary) hypertension: Secondary | ICD-10-CM | POA: Diagnosis not present

## 2018-01-17 DIAGNOSIS — I25810 Atherosclerosis of coronary artery bypass graft(s) without angina pectoris: Secondary | ICD-10-CM | POA: Diagnosis not present

## 2018-01-17 DIAGNOSIS — R54 Age-related physical debility: Secondary | ICD-10-CM | POA: Diagnosis not present

## 2018-01-17 DIAGNOSIS — R0902 Hypoxemia: Secondary | ICD-10-CM | POA: Diagnosis not present

## 2018-01-17 DIAGNOSIS — D473 Essential (hemorrhagic) thrombocythemia: Secondary | ICD-10-CM | POA: Diagnosis not present

## 2018-01-17 DIAGNOSIS — I4891 Unspecified atrial fibrillation: Secondary | ICD-10-CM | POA: Diagnosis not present

## 2018-01-17 DIAGNOSIS — I5030 Unspecified diastolic (congestive) heart failure: Secondary | ICD-10-CM | POA: Diagnosis not present

## 2018-01-17 DIAGNOSIS — H11149 Conjunctival xerosis, unspecified, unspecified eye: Secondary | ICD-10-CM | POA: Diagnosis not present

## 2018-01-17 DIAGNOSIS — J918 Pleural effusion in other conditions classified elsewhere: Secondary | ICD-10-CM | POA: Diagnosis not present

## 2018-01-17 DIAGNOSIS — K589 Irritable bowel syndrome without diarrhea: Secondary | ICD-10-CM | POA: Diagnosis not present

## 2018-01-17 DIAGNOSIS — D469 Myelodysplastic syndrome, unspecified: Secondary | ICD-10-CM | POA: Diagnosis not present

## 2018-01-21 DIAGNOSIS — I25810 Atherosclerosis of coronary artery bypass graft(s) without angina pectoris: Secondary | ICD-10-CM | POA: Diagnosis not present

## 2018-01-21 DIAGNOSIS — D469 Myelodysplastic syndrome, unspecified: Secondary | ICD-10-CM | POA: Diagnosis not present

## 2018-01-21 DIAGNOSIS — I5030 Unspecified diastolic (congestive) heart failure: Secondary | ICD-10-CM | POA: Diagnosis not present

## 2018-01-21 DIAGNOSIS — J449 Chronic obstructive pulmonary disease, unspecified: Secondary | ICD-10-CM | POA: Diagnosis not present

## 2018-01-21 DIAGNOSIS — I10 Essential (primary) hypertension: Secondary | ICD-10-CM | POA: Diagnosis not present

## 2018-01-21 DIAGNOSIS — I4891 Unspecified atrial fibrillation: Secondary | ICD-10-CM | POA: Diagnosis not present

## 2018-01-24 DIAGNOSIS — I10 Essential (primary) hypertension: Secondary | ICD-10-CM | POA: Diagnosis not present

## 2018-01-24 DIAGNOSIS — I25810 Atherosclerosis of coronary artery bypass graft(s) without angina pectoris: Secondary | ICD-10-CM | POA: Diagnosis not present

## 2018-01-24 DIAGNOSIS — I5030 Unspecified diastolic (congestive) heart failure: Secondary | ICD-10-CM | POA: Diagnosis not present

## 2018-01-24 DIAGNOSIS — D469 Myelodysplastic syndrome, unspecified: Secondary | ICD-10-CM | POA: Diagnosis not present

## 2018-01-24 DIAGNOSIS — I4891 Unspecified atrial fibrillation: Secondary | ICD-10-CM | POA: Diagnosis not present

## 2018-01-24 DIAGNOSIS — J449 Chronic obstructive pulmonary disease, unspecified: Secondary | ICD-10-CM | POA: Diagnosis not present

## 2018-01-26 DIAGNOSIS — J449 Chronic obstructive pulmonary disease, unspecified: Secondary | ICD-10-CM | POA: Diagnosis not present

## 2018-01-26 DIAGNOSIS — D469 Myelodysplastic syndrome, unspecified: Secondary | ICD-10-CM | POA: Diagnosis not present

## 2018-01-26 DIAGNOSIS — N390 Urinary tract infection, site not specified: Secondary | ICD-10-CM | POA: Diagnosis not present

## 2018-01-26 DIAGNOSIS — I25810 Atherosclerosis of coronary artery bypass graft(s) without angina pectoris: Secondary | ICD-10-CM | POA: Diagnosis not present

## 2018-01-26 DIAGNOSIS — R319 Hematuria, unspecified: Secondary | ICD-10-CM | POA: Diagnosis not present

## 2018-01-26 DIAGNOSIS — I5030 Unspecified diastolic (congestive) heart failure: Secondary | ICD-10-CM | POA: Diagnosis not present

## 2018-01-26 DIAGNOSIS — I10 Essential (primary) hypertension: Secondary | ICD-10-CM | POA: Diagnosis not present

## 2018-01-26 DIAGNOSIS — I4891 Unspecified atrial fibrillation: Secondary | ICD-10-CM | POA: Diagnosis not present

## 2018-01-29 DIAGNOSIS — D469 Myelodysplastic syndrome, unspecified: Secondary | ICD-10-CM | POA: Diagnosis not present

## 2018-01-29 DIAGNOSIS — I5030 Unspecified diastolic (congestive) heart failure: Secondary | ICD-10-CM | POA: Diagnosis not present

## 2018-01-29 DIAGNOSIS — I25810 Atherosclerosis of coronary artery bypass graft(s) without angina pectoris: Secondary | ICD-10-CM | POA: Diagnosis not present

## 2018-01-29 DIAGNOSIS — I10 Essential (primary) hypertension: Secondary | ICD-10-CM | POA: Diagnosis not present

## 2018-01-29 DIAGNOSIS — I4891 Unspecified atrial fibrillation: Secondary | ICD-10-CM | POA: Diagnosis not present

## 2018-01-29 DIAGNOSIS — J449 Chronic obstructive pulmonary disease, unspecified: Secondary | ICD-10-CM | POA: Diagnosis not present

## 2018-02-02 DIAGNOSIS — I5030 Unspecified diastolic (congestive) heart failure: Secondary | ICD-10-CM | POA: Diagnosis not present

## 2018-02-02 DIAGNOSIS — J449 Chronic obstructive pulmonary disease, unspecified: Secondary | ICD-10-CM | POA: Diagnosis not present

## 2018-02-02 DIAGNOSIS — D469 Myelodysplastic syndrome, unspecified: Secondary | ICD-10-CM | POA: Diagnosis not present

## 2018-02-02 DIAGNOSIS — I4891 Unspecified atrial fibrillation: Secondary | ICD-10-CM | POA: Diagnosis not present

## 2018-02-02 DIAGNOSIS — I10 Essential (primary) hypertension: Secondary | ICD-10-CM | POA: Diagnosis not present

## 2018-02-02 DIAGNOSIS — I25810 Atherosclerosis of coronary artery bypass graft(s) without angina pectoris: Secondary | ICD-10-CM | POA: Diagnosis not present

## 2018-02-08 DIAGNOSIS — I5030 Unspecified diastolic (congestive) heart failure: Secondary | ICD-10-CM | POA: Diagnosis not present

## 2018-02-08 DIAGNOSIS — J449 Chronic obstructive pulmonary disease, unspecified: Secondary | ICD-10-CM | POA: Diagnosis not present

## 2018-02-08 DIAGNOSIS — I4891 Unspecified atrial fibrillation: Secondary | ICD-10-CM | POA: Diagnosis not present

## 2018-02-08 DIAGNOSIS — D469 Myelodysplastic syndrome, unspecified: Secondary | ICD-10-CM | POA: Diagnosis not present

## 2018-02-08 DIAGNOSIS — I10 Essential (primary) hypertension: Secondary | ICD-10-CM | POA: Diagnosis not present

## 2018-02-08 DIAGNOSIS — I25810 Atherosclerosis of coronary artery bypass graft(s) without angina pectoris: Secondary | ICD-10-CM | POA: Diagnosis not present

## 2018-02-13 DIAGNOSIS — I5030 Unspecified diastolic (congestive) heart failure: Secondary | ICD-10-CM | POA: Diagnosis not present

## 2018-02-13 DIAGNOSIS — I25810 Atherosclerosis of coronary artery bypass graft(s) without angina pectoris: Secondary | ICD-10-CM | POA: Diagnosis not present

## 2018-02-13 DIAGNOSIS — I4891 Unspecified atrial fibrillation: Secondary | ICD-10-CM | POA: Diagnosis not present

## 2018-02-13 DIAGNOSIS — J449 Chronic obstructive pulmonary disease, unspecified: Secondary | ICD-10-CM | POA: Diagnosis not present

## 2018-02-13 DIAGNOSIS — I10 Essential (primary) hypertension: Secondary | ICD-10-CM | POA: Diagnosis not present

## 2018-02-13 DIAGNOSIS — D469 Myelodysplastic syndrome, unspecified: Secondary | ICD-10-CM | POA: Diagnosis not present

## 2018-02-16 DIAGNOSIS — D469 Myelodysplastic syndrome, unspecified: Secondary | ICD-10-CM | POA: Diagnosis not present

## 2018-02-16 DIAGNOSIS — I10 Essential (primary) hypertension: Secondary | ICD-10-CM | POA: Diagnosis not present

## 2018-02-16 DIAGNOSIS — I25810 Atherosclerosis of coronary artery bypass graft(s) without angina pectoris: Secondary | ICD-10-CM | POA: Diagnosis not present

## 2018-02-16 DIAGNOSIS — I5030 Unspecified diastolic (congestive) heart failure: Secondary | ICD-10-CM | POA: Diagnosis not present

## 2018-02-16 DIAGNOSIS — J449 Chronic obstructive pulmonary disease, unspecified: Secondary | ICD-10-CM | POA: Diagnosis not present

## 2018-02-16 DIAGNOSIS — I4891 Unspecified atrial fibrillation: Secondary | ICD-10-CM | POA: Diagnosis not present

## 2018-02-17 DIAGNOSIS — I10 Essential (primary) hypertension: Secondary | ICD-10-CM | POA: Diagnosis not present

## 2018-02-17 DIAGNOSIS — D72818 Other decreased white blood cell count: Secondary | ICD-10-CM | POA: Diagnosis not present

## 2018-02-17 DIAGNOSIS — K831 Obstruction of bile duct: Secondary | ICD-10-CM | POA: Diagnosis not present

## 2018-02-17 DIAGNOSIS — K219 Gastro-esophageal reflux disease without esophagitis: Secondary | ICD-10-CM | POA: Diagnosis not present

## 2018-02-17 DIAGNOSIS — D473 Essential (hemorrhagic) thrombocythemia: Secondary | ICD-10-CM | POA: Diagnosis not present

## 2018-02-17 DIAGNOSIS — R0902 Hypoxemia: Secondary | ICD-10-CM | POA: Diagnosis not present

## 2018-02-17 DIAGNOSIS — J449 Chronic obstructive pulmonary disease, unspecified: Secondary | ICD-10-CM | POA: Diagnosis not present

## 2018-02-17 DIAGNOSIS — R54 Age-related physical debility: Secondary | ICD-10-CM | POA: Diagnosis not present

## 2018-02-17 DIAGNOSIS — H11149 Conjunctival xerosis, unspecified, unspecified eye: Secondary | ICD-10-CM | POA: Diagnosis not present

## 2018-02-17 DIAGNOSIS — D630 Anemia in neoplastic disease: Secondary | ICD-10-CM | POA: Diagnosis not present

## 2018-02-17 DIAGNOSIS — I25810 Atherosclerosis of coronary artery bypass graft(s) without angina pectoris: Secondary | ICD-10-CM | POA: Diagnosis not present

## 2018-02-17 DIAGNOSIS — K589 Irritable bowel syndrome without diarrhea: Secondary | ICD-10-CM | POA: Diagnosis not present

## 2018-02-17 DIAGNOSIS — I4891 Unspecified atrial fibrillation: Secondary | ICD-10-CM | POA: Diagnosis not present

## 2018-02-17 DIAGNOSIS — J918 Pleural effusion in other conditions classified elsewhere: Secondary | ICD-10-CM | POA: Diagnosis not present

## 2018-02-17 DIAGNOSIS — I5030 Unspecified diastolic (congestive) heart failure: Secondary | ICD-10-CM | POA: Diagnosis not present

## 2018-02-17 DIAGNOSIS — D469 Myelodysplastic syndrome, unspecified: Secondary | ICD-10-CM | POA: Diagnosis not present

## 2018-02-19 DIAGNOSIS — D469 Myelodysplastic syndrome, unspecified: Secondary | ICD-10-CM | POA: Diagnosis not present

## 2018-02-19 DIAGNOSIS — I25810 Atherosclerosis of coronary artery bypass graft(s) without angina pectoris: Secondary | ICD-10-CM | POA: Diagnosis not present

## 2018-02-19 DIAGNOSIS — J449 Chronic obstructive pulmonary disease, unspecified: Secondary | ICD-10-CM | POA: Diagnosis not present

## 2018-02-19 DIAGNOSIS — I10 Essential (primary) hypertension: Secondary | ICD-10-CM | POA: Diagnosis not present

## 2018-02-19 DIAGNOSIS — I5030 Unspecified diastolic (congestive) heart failure: Secondary | ICD-10-CM | POA: Diagnosis not present

## 2018-02-19 DIAGNOSIS — I4891 Unspecified atrial fibrillation: Secondary | ICD-10-CM | POA: Diagnosis not present

## 2018-02-23 DIAGNOSIS — I5030 Unspecified diastolic (congestive) heart failure: Secondary | ICD-10-CM | POA: Diagnosis not present

## 2018-02-23 DIAGNOSIS — I10 Essential (primary) hypertension: Secondary | ICD-10-CM | POA: Diagnosis not present

## 2018-02-23 DIAGNOSIS — J449 Chronic obstructive pulmonary disease, unspecified: Secondary | ICD-10-CM | POA: Diagnosis not present

## 2018-02-23 DIAGNOSIS — I25810 Atherosclerosis of coronary artery bypass graft(s) without angina pectoris: Secondary | ICD-10-CM | POA: Diagnosis not present

## 2018-02-23 DIAGNOSIS — I4891 Unspecified atrial fibrillation: Secondary | ICD-10-CM | POA: Diagnosis not present

## 2018-02-23 DIAGNOSIS — D469 Myelodysplastic syndrome, unspecified: Secondary | ICD-10-CM | POA: Diagnosis not present

## 2018-02-27 DIAGNOSIS — J449 Chronic obstructive pulmonary disease, unspecified: Secondary | ICD-10-CM | POA: Diagnosis not present

## 2018-02-27 DIAGNOSIS — I10 Essential (primary) hypertension: Secondary | ICD-10-CM | POA: Diagnosis not present

## 2018-02-27 DIAGNOSIS — I5030 Unspecified diastolic (congestive) heart failure: Secondary | ICD-10-CM | POA: Diagnosis not present

## 2018-02-27 DIAGNOSIS — D469 Myelodysplastic syndrome, unspecified: Secondary | ICD-10-CM | POA: Diagnosis not present

## 2018-02-27 DIAGNOSIS — I4891 Unspecified atrial fibrillation: Secondary | ICD-10-CM | POA: Diagnosis not present

## 2018-02-27 DIAGNOSIS — I25810 Atherosclerosis of coronary artery bypass graft(s) without angina pectoris: Secondary | ICD-10-CM | POA: Diagnosis not present

## 2018-02-28 DIAGNOSIS — I10 Essential (primary) hypertension: Secondary | ICD-10-CM | POA: Diagnosis not present

## 2018-02-28 DIAGNOSIS — I25810 Atherosclerosis of coronary artery bypass graft(s) without angina pectoris: Secondary | ICD-10-CM | POA: Diagnosis not present

## 2018-02-28 DIAGNOSIS — D469 Myelodysplastic syndrome, unspecified: Secondary | ICD-10-CM | POA: Diagnosis not present

## 2018-02-28 DIAGNOSIS — I5030 Unspecified diastolic (congestive) heart failure: Secondary | ICD-10-CM | POA: Diagnosis not present

## 2018-02-28 DIAGNOSIS — J449 Chronic obstructive pulmonary disease, unspecified: Secondary | ICD-10-CM | POA: Diagnosis not present

## 2018-02-28 DIAGNOSIS — I4891 Unspecified atrial fibrillation: Secondary | ICD-10-CM | POA: Diagnosis not present

## 2018-03-01 ENCOUNTER — Ambulatory Visit: Payer: Medicare Other | Admitting: Internal Medicine

## 2018-03-01 DIAGNOSIS — I4891 Unspecified atrial fibrillation: Secondary | ICD-10-CM | POA: Diagnosis not present

## 2018-03-01 DIAGNOSIS — J449 Chronic obstructive pulmonary disease, unspecified: Secondary | ICD-10-CM | POA: Diagnosis not present

## 2018-03-01 DIAGNOSIS — I5030 Unspecified diastolic (congestive) heart failure: Secondary | ICD-10-CM | POA: Diagnosis not present

## 2018-03-01 DIAGNOSIS — I10 Essential (primary) hypertension: Secondary | ICD-10-CM | POA: Diagnosis not present

## 2018-03-01 DIAGNOSIS — I25810 Atherosclerosis of coronary artery bypass graft(s) without angina pectoris: Secondary | ICD-10-CM | POA: Diagnosis not present

## 2018-03-01 DIAGNOSIS — D469 Myelodysplastic syndrome, unspecified: Secondary | ICD-10-CM | POA: Diagnosis not present

## 2018-03-02 DIAGNOSIS — D469 Myelodysplastic syndrome, unspecified: Secondary | ICD-10-CM | POA: Diagnosis not present

## 2018-03-02 DIAGNOSIS — I10 Essential (primary) hypertension: Secondary | ICD-10-CM | POA: Diagnosis not present

## 2018-03-02 DIAGNOSIS — I5030 Unspecified diastolic (congestive) heart failure: Secondary | ICD-10-CM | POA: Diagnosis not present

## 2018-03-02 DIAGNOSIS — I25810 Atherosclerosis of coronary artery bypass graft(s) without angina pectoris: Secondary | ICD-10-CM | POA: Diagnosis not present

## 2018-03-02 DIAGNOSIS — I4891 Unspecified atrial fibrillation: Secondary | ICD-10-CM | POA: Diagnosis not present

## 2018-03-02 DIAGNOSIS — J449 Chronic obstructive pulmonary disease, unspecified: Secondary | ICD-10-CM | POA: Diagnosis not present

## 2018-03-10 DIAGNOSIS — D469 Myelodysplastic syndrome, unspecified: Secondary | ICD-10-CM | POA: Diagnosis not present

## 2018-03-10 DIAGNOSIS — I25810 Atherosclerosis of coronary artery bypass graft(s) without angina pectoris: Secondary | ICD-10-CM | POA: Diagnosis not present

## 2018-03-10 DIAGNOSIS — I10 Essential (primary) hypertension: Secondary | ICD-10-CM | POA: Diagnosis not present

## 2018-03-10 DIAGNOSIS — J449 Chronic obstructive pulmonary disease, unspecified: Secondary | ICD-10-CM | POA: Diagnosis not present

## 2018-03-10 DIAGNOSIS — I4891 Unspecified atrial fibrillation: Secondary | ICD-10-CM | POA: Diagnosis not present

## 2018-03-10 DIAGNOSIS — I5030 Unspecified diastolic (congestive) heart failure: Secondary | ICD-10-CM | POA: Diagnosis not present

## 2018-03-12 DIAGNOSIS — I10 Essential (primary) hypertension: Secondary | ICD-10-CM | POA: Diagnosis not present

## 2018-03-12 DIAGNOSIS — I5030 Unspecified diastolic (congestive) heart failure: Secondary | ICD-10-CM | POA: Diagnosis not present

## 2018-03-12 DIAGNOSIS — I4891 Unspecified atrial fibrillation: Secondary | ICD-10-CM | POA: Diagnosis not present

## 2018-03-12 DIAGNOSIS — J449 Chronic obstructive pulmonary disease, unspecified: Secondary | ICD-10-CM | POA: Diagnosis not present

## 2018-03-12 DIAGNOSIS — I25810 Atherosclerosis of coronary artery bypass graft(s) without angina pectoris: Secondary | ICD-10-CM | POA: Diagnosis not present

## 2018-03-12 DIAGNOSIS — D469 Myelodysplastic syndrome, unspecified: Secondary | ICD-10-CM | POA: Diagnosis not present

## 2018-03-13 DIAGNOSIS — D469 Myelodysplastic syndrome, unspecified: Secondary | ICD-10-CM | POA: Diagnosis not present

## 2018-03-13 DIAGNOSIS — I25810 Atherosclerosis of coronary artery bypass graft(s) without angina pectoris: Secondary | ICD-10-CM | POA: Diagnosis not present

## 2018-03-13 DIAGNOSIS — I10 Essential (primary) hypertension: Secondary | ICD-10-CM | POA: Diagnosis not present

## 2018-03-13 DIAGNOSIS — I4891 Unspecified atrial fibrillation: Secondary | ICD-10-CM | POA: Diagnosis not present

## 2018-03-13 DIAGNOSIS — J449 Chronic obstructive pulmonary disease, unspecified: Secondary | ICD-10-CM | POA: Diagnosis not present

## 2018-03-13 DIAGNOSIS — I5030 Unspecified diastolic (congestive) heart failure: Secondary | ICD-10-CM | POA: Diagnosis not present

## 2018-03-16 ENCOUNTER — Encounter: Payer: Self-pay | Admitting: *Deleted

## 2018-03-16 DIAGNOSIS — I25810 Atherosclerosis of coronary artery bypass graft(s) without angina pectoris: Secondary | ICD-10-CM | POA: Diagnosis not present

## 2018-03-16 DIAGNOSIS — I5030 Unspecified diastolic (congestive) heart failure: Secondary | ICD-10-CM | POA: Diagnosis not present

## 2018-03-16 DIAGNOSIS — J449 Chronic obstructive pulmonary disease, unspecified: Secondary | ICD-10-CM | POA: Diagnosis not present

## 2018-03-16 DIAGNOSIS — I10 Essential (primary) hypertension: Secondary | ICD-10-CM | POA: Diagnosis not present

## 2018-03-16 DIAGNOSIS — D469 Myelodysplastic syndrome, unspecified: Secondary | ICD-10-CM | POA: Diagnosis not present

## 2018-03-16 DIAGNOSIS — I4891 Unspecified atrial fibrillation: Secondary | ICD-10-CM | POA: Diagnosis not present

## 2018-03-19 DIAGNOSIS — H11149 Conjunctival xerosis, unspecified, unspecified eye: Secondary | ICD-10-CM | POA: Diagnosis not present

## 2018-03-19 DIAGNOSIS — R54 Age-related physical debility: Secondary | ICD-10-CM | POA: Diagnosis not present

## 2018-03-19 DIAGNOSIS — D72818 Other decreased white blood cell count: Secondary | ICD-10-CM | POA: Diagnosis not present

## 2018-03-19 DIAGNOSIS — I5030 Unspecified diastolic (congestive) heart failure: Secondary | ICD-10-CM | POA: Diagnosis not present

## 2018-03-19 DIAGNOSIS — D469 Myelodysplastic syndrome, unspecified: Secondary | ICD-10-CM | POA: Diagnosis not present

## 2018-03-19 DIAGNOSIS — I10 Essential (primary) hypertension: Secondary | ICD-10-CM | POA: Diagnosis not present

## 2018-03-19 DIAGNOSIS — I4891 Unspecified atrial fibrillation: Secondary | ICD-10-CM | POA: Diagnosis not present

## 2018-03-19 DIAGNOSIS — J449 Chronic obstructive pulmonary disease, unspecified: Secondary | ICD-10-CM | POA: Diagnosis not present

## 2018-03-19 DIAGNOSIS — K589 Irritable bowel syndrome without diarrhea: Secondary | ICD-10-CM | POA: Diagnosis not present

## 2018-03-19 DIAGNOSIS — I25810 Atherosclerosis of coronary artery bypass graft(s) without angina pectoris: Secondary | ICD-10-CM | POA: Diagnosis not present

## 2018-03-19 DIAGNOSIS — R0902 Hypoxemia: Secondary | ICD-10-CM | POA: Diagnosis not present

## 2018-03-19 DIAGNOSIS — K219 Gastro-esophageal reflux disease without esophagitis: Secondary | ICD-10-CM | POA: Diagnosis not present

## 2018-03-19 DIAGNOSIS — J918 Pleural effusion in other conditions classified elsewhere: Secondary | ICD-10-CM | POA: Diagnosis not present

## 2018-03-19 DIAGNOSIS — D630 Anemia in neoplastic disease: Secondary | ICD-10-CM | POA: Diagnosis not present

## 2018-03-19 DIAGNOSIS — D473 Essential (hemorrhagic) thrombocythemia: Secondary | ICD-10-CM | POA: Diagnosis not present

## 2018-03-19 DIAGNOSIS — K831 Obstruction of bile duct: Secondary | ICD-10-CM | POA: Diagnosis not present

## 2018-03-23 DIAGNOSIS — I4891 Unspecified atrial fibrillation: Secondary | ICD-10-CM | POA: Diagnosis not present

## 2018-03-23 DIAGNOSIS — D469 Myelodysplastic syndrome, unspecified: Secondary | ICD-10-CM | POA: Diagnosis not present

## 2018-03-23 DIAGNOSIS — J449 Chronic obstructive pulmonary disease, unspecified: Secondary | ICD-10-CM | POA: Diagnosis not present

## 2018-03-23 DIAGNOSIS — I10 Essential (primary) hypertension: Secondary | ICD-10-CM | POA: Diagnosis not present

## 2018-03-23 DIAGNOSIS — I5030 Unspecified diastolic (congestive) heart failure: Secondary | ICD-10-CM | POA: Diagnosis not present

## 2018-03-23 DIAGNOSIS — I25810 Atherosclerosis of coronary artery bypass graft(s) without angina pectoris: Secondary | ICD-10-CM | POA: Diagnosis not present

## 2018-03-25 DIAGNOSIS — I4891 Unspecified atrial fibrillation: Secondary | ICD-10-CM | POA: Diagnosis not present

## 2018-03-25 DIAGNOSIS — J449 Chronic obstructive pulmonary disease, unspecified: Secondary | ICD-10-CM | POA: Diagnosis not present

## 2018-03-25 DIAGNOSIS — I25810 Atherosclerosis of coronary artery bypass graft(s) without angina pectoris: Secondary | ICD-10-CM | POA: Diagnosis not present

## 2018-03-25 DIAGNOSIS — I5030 Unspecified diastolic (congestive) heart failure: Secondary | ICD-10-CM | POA: Diagnosis not present

## 2018-03-25 DIAGNOSIS — I10 Essential (primary) hypertension: Secondary | ICD-10-CM | POA: Diagnosis not present

## 2018-03-25 DIAGNOSIS — D469 Myelodysplastic syndrome, unspecified: Secondary | ICD-10-CM | POA: Diagnosis not present

## 2018-03-28 DIAGNOSIS — L089 Local infection of the skin and subcutaneous tissue, unspecified: Secondary | ICD-10-CM | POA: Diagnosis not present

## 2018-03-29 ENCOUNTER — Encounter

## 2018-03-29 ENCOUNTER — Encounter: Payer: Self-pay | Admitting: Internal Medicine

## 2018-03-29 ENCOUNTER — Ambulatory Visit (INDEPENDENT_AMBULATORY_CARE_PROVIDER_SITE_OTHER): Admitting: Internal Medicine

## 2018-03-29 VITALS — BP 102/60 | HR 92 | Ht 64.57 in | Wt 129.0 lb

## 2018-03-29 DIAGNOSIS — R1310 Dysphagia, unspecified: Secondary | ICD-10-CM

## 2018-03-29 DIAGNOSIS — J449 Chronic obstructive pulmonary disease, unspecified: Secondary | ICD-10-CM | POA: Diagnosis not present

## 2018-03-29 DIAGNOSIS — D469 Myelodysplastic syndrome, unspecified: Secondary | ICD-10-CM | POA: Diagnosis not present

## 2018-03-29 DIAGNOSIS — I5030 Unspecified diastolic (congestive) heart failure: Secondary | ICD-10-CM | POA: Diagnosis not present

## 2018-03-29 DIAGNOSIS — Z860100 Personal history of colon polyps, unspecified: Secondary | ICD-10-CM

## 2018-03-29 DIAGNOSIS — K222 Esophageal obstruction: Secondary | ICD-10-CM | POA: Diagnosis not present

## 2018-03-29 DIAGNOSIS — I10 Essential (primary) hypertension: Secondary | ICD-10-CM | POA: Diagnosis not present

## 2018-03-29 DIAGNOSIS — R1319 Other dysphagia: Secondary | ICD-10-CM

## 2018-03-29 DIAGNOSIS — I4891 Unspecified atrial fibrillation: Secondary | ICD-10-CM | POA: Diagnosis not present

## 2018-03-29 DIAGNOSIS — I25810 Atherosclerosis of coronary artery bypass graft(s) without angina pectoris: Secondary | ICD-10-CM | POA: Diagnosis not present

## 2018-03-29 NOTE — Patient Instructions (Signed)
You have been scheduled for an endoscopy. Please follow written instructions given to you at your visit today. If you use inhalers (even only as needed), please bring them with you on the day of your procedure. Your physician has requested that you go to www.startemmi.com and enter the access code given to you at your visit today. This web site gives a general overview about your procedure. However, you should still follow specific instructions given to you by our office regarding your preparation for the procedure.  If you are age 63 or older, your body mass index should be between 23-30. Your Body mass index is 21.76 kg/m. If this is out of the aforementioned range listed, please consider follow up with your Primary Care Provider.  If you are age 27 or younger, your body mass index should be between 19-25. Your Body mass index is 21.76 kg/m. If this is out of the aformentioned range listed, please consider follow up with your Primary Care Provider.

## 2018-03-30 ENCOUNTER — Encounter: Payer: Self-pay | Admitting: Internal Medicine

## 2018-03-30 NOTE — Progress Notes (Signed)
Patient ID: Lori [name], female   DOB: November 15, 1925, 84 y.o.   MRN: [id number] HPI: [name] is a 84 year old female with a past medical history of adenomatous colon polyps, IBS, GERD with esophageal stricture, atrial fibrillation, COPD on oxygen who is seen in consult at the request of Dr. Philip Aspen to discuss esophageal dysphagia.  She is here alone today.  She is known to me from 2013 when she had a colonoscopy performed for change in bowel habit.  A large sessile serrated polyp was found in the ascending colon and she was referred to Dr. Mont Dutton at [facility] where a series of colonoscopies were performed for endoscopic mucosal resection.  Her last colonoscopy was in April 2014 where there was no residual polyp found in the ascending colon.  She reports most recently she is having what she feels to be progressive solid food dysphagia.  She has issues with eating particularly meat such as steak and bread.  She feels that food sticks in her mid chest and needs to be forced down with fluids.  She denies odynophagia.  No trouble with liquid food dysphagia.  Denies nausea and vomiting.  Denies abdominal pain.  Does report dark stools but notes that she is on oral iron.  Occasional loose stools which is normal for her.  She reminds me that she is not interested in any further colonoscopies.  She did have a barium esophagram performed in January as well as a speech and swallow evaluation.  Barium esophagram, reviewed today, showed a smooth stricture in the distal esophagus impeding the barium tablet transiently.  There was also a small hiatus hernia.  I was able to see an upper endoscopy performed by Dr. Verl Blalock on 11/27/2006 which showed a small hiatus hernia but was otherwise normal.  It does not appear dilation was performed at this time  The patient not wearing oxygen today has oxygen with her and states she does not wear it "as much as I probably should".   Past Medical History:  Diagnosis Date   . ALLERGIC RHINITIS   . Anxiety   . Asthma   . Atrial fibrillation ([place])   . Back pain of thoracolumbar region    right  . Bronchiectasis   . Colles' fracture of left radius   . COPD (chronic obstructive pulmonary disease) ([place])   . Coronary artery disease   . Diastolic dysfunction   . Diverticulosis   . Dysrhythmia   . Esophageal stricture   . Hiatal hernia   . History of pneumonia   . Hx of cardiovascular stress test    a. ETT-MV 1/14: Exercised 4:16, no ECG changes, poor ex tol, ant defect c/w soft tissue atten, no ischemia, EF 75%  . Hx of colonic polyp   . Hyperlipidemia   . Hypertension   . IBS (irritable bowel syndrome)   . Insomnia   . Multiple rib fractures 10/2008   bilateral  . Pleural effusion, left   . Pruritus   . Pulmonary nodule   . Syncope     Past Surgical History:  Procedure Laterality Date  . ANKLE CLOSED REDUCTION Right 12/11/2014   Procedure: CLOSED REDUCTION ANKLE;  Surgeon: Rod Can, MD;  Location: [place];  Service: Orthopedics;  Laterality: Right;  . APPENDECTOMY    . BLADDER SUSPENSION  2005   A-P  . CHOLECYSTECTOMY    . COLONOSCOPY  04/06/2012   Procedure: COLONOSCOPY;  Surgeon: Jerene Bears, MD;  [facility];  Service: Gastroenterology;  Laterality: N/A;  . EXTERNAL FIXATION LEG Right 12/12/2014   Procedure: ADJUSTMENT OF EXTERNAL FIXATION  RIGHT ANKLE;  Surgeon: Rod Can, MD;  Location: [place];  Service: Orthopedics;  Laterality: Right;  . EXTERNAL FIXATION LEG Right 12/11/2014   Procedure: POSSIBLE EXTERNAL FIXATION RIGHT ANKLE;  Surgeon: Rod Can, MD;  Location: [place];  Service: Orthopedics;  Laterality: Right;  . HARDWARE REMOVAL Right 12/25/2014   Procedure: RIGHT ANKLE REMOVE OF DEEP IMPLANT ANE EXTERNAL FIXATOR ;  Surgeon: [name], MD;  Location: [place];  Service: Orthopedics;  Laterality: Right;  . ORIF ANKLE FRACTURE Right 12/25/2014   Procedure: OPEN REDUCTION INTERNAL FIXATION  (ORIF)TRIMALLEOLAR  ANKLE FRACTURE;  Surgeon: [name], MD;  Location: [place];  Service: Orthopedics;  Laterality: Right;  . ORIF WRIST FRACTURE Left 2010     Dr. Burney Gauze.  Marland Kitchen POLYPECTOMY    . ROTATOR CUFF REPAIR Right 2006  . TONSILLECTOMY    . TOTAL KNEE ARTHROPLASTY Right 08/2010    Outpatient Medications Prior to Visit  Medication Sig Dispense Refill  . acetaminophen (TYLENOL) 500 MG tablet Take 1,000 mg by mouth 2 (two) times daily as needed for headache (pain). Not to exceed 3000mg  daily    . aspirin EC 81 MG tablet Take 1 tablet (81 mg total) by mouth daily. 90 tablet 3  . bimatoprost (LUMIGAN) 0.01 % SOLN Place 1 drop into both eyes at bedtime. 2.5 mL 3  . cetirizine (ZYRTEC) 10 MG tablet Take 10 mg by mouth daily.    . clonazePAM (KLONOPIN) 0.5 MG tablet Take 0.5 tablets (0.25 mg total) by mouth 3 (three) times daily as needed (anxiety). 15 tablet 0  . dicyclomine (BENTYL) 10 MG capsule Take 1 capsule (10 mg total) by mouth 3 (three) times daily before meals. IBS 90 capsule 3  . diltiazem (CARDIZEM CD) 360 MG 24 hr capsule Take 1 capsule (360 mg total) by mouth daily. 30 capsule 0  . Emollient (EUCERIN) lotion Apply topically as needed for dry skin.    . furosemide (LASIX) 20 MG tablet Take 1 tablet (20 mg total) by mouth daily. 30 tablet 0  . hydroxypropyl methylcellulose / hypromellose (ISOPTO TEARS / GONIOVISC) 2.5 % ophthalmic solution Place 1 drop into both eyes 3 (three) times daily.    Marland Kitchen ipratropium (ATROVENT) 0.02 % nebulizer solution Take 0.5 mg by nebulization 2 (two) times daily.    . iron polysaccharides (NIFEREX) 150 MG capsule Take 150 mg by mouth daily.    Marland Kitchen levalbuterol (XOPENEX) 1.25 MG/0.5ML nebulizer solution Take 1.25 mg by nebulization every 6 (six) hours as needed for wheezing or shortness of breath. 1 each 12  . loperamide (IMODIUM) 2 MG capsule Take 2 mg by mouth as needed for diarrhea or loose stools.     Marland Kitchen losartan (COZAAR) 50 MG  tablet Take 1 tablet (50 mg total) by mouth 2 (two) times daily. 60 tablet 0  . magnesium oxide (MAG-OX) 400 MG tablet Take 400 mg by mouth daily as needed (leg cramps).    . metoprolol tartrate (LOPRESSOR) 50 MG tablet Take 1 tablet (50 mg total) by mouth 2 (two) times daily. 60 tablet 0  . Multiple Vitamin (MULTIVITAMIN WITH MINERALS) TABS tablet Take 1 tablet by mouth daily.    Marland Kitchen oxycodone (OXY-IR) 5 MG capsule Take 5 mg by mouth every 4 (four) hours as needed.    . zolpidem (AMBIEN) 5 MG tablet Take 1 tablet (5 mg total) by mouth  at bedtime as needed for sleep. 30 tablet 5   No facility-administered medications prior to visit.     Allergies  Allergen Reactions  . Albuterol Sulfate Palpitations  . Levaquin [Levofloxacin In D5w] Other (See Comments)    QT prolongation. Should avoid all flouroquinolones.   . Lactose Intolerance (Gi) Other (See Comments)  . Amoxicillin Other (See Comments)    REACTION: unspecified  . Codeine Other (See Comments)    Can take Hydrocodone  . Penicillins Hives, Itching and Rash    Hard lump and rash Has patient had a PCN reaction causing immediate rash, facial/tongue/throat swelling, SOB or lightheadedness with hypotension: Yes Has patient had a PCN reaction causing severe rash involving mucus membranes or skin necrosis: No Has patient had a PCN reaction that required hospitalization unknown Has patient had a PCN reaction occurring within the last 10 years: No If all of the above answers are "NO", then may proceed with Cephalosporin use.  . Sulfa Antibiotics Nausea Only  . Sulfonamide Derivatives Nausea Only    Family History  Problem Relation Age of Onset  . Heart disease Mother   . Rheumatic fever Mother   . Bipolar disorder Daughter   . Pneumonia Father   . Colon cancer Neg Hx     Social History   Tobacco Use  . Smoking status: Former Smoker    Packs/day: 0.30    Years: 10.00    Pack years: 3.00    Types: Cigarettes    Last attempt to  quit: 09/19/1948    Years since quitting: 69.5  . Smokeless tobacco: Never Used  Substance Use Topics  . Alcohol use: No    Comment: rarely  . Drug use: No    ROS: As per history of present illness, otherwise negative  BP 102/60   Pulse 92   Ht 5' 4.57" (1.64 m)   Wt 129 lb (58.5 kg)   BMI 21.76 kg/m  Constitutional: Well-developed and well-nourished. No distress. HEENT: Normocephalic and atraumatic. Conjunctivae are normal.  No scleral icterus. Neck: Neck supple. Trachea midline. Cardiovascular: Normal rate, regular rhythm and intact distal pulses. 2/6 SEM Pulmonary/chest: Effort normal and breath sounds normal. No wheezing, rales or rhonchi. Abdominal: Soft, nontender, nondistended. Bowel sounds active throughout.  Extremities: no clubbing, cyanosis, or edema Neurological: Alert and oriented to person place and time. Skin: Skin is warm and dry.  Psychiatric: Normal mood and affect. Behavior is normal.  RELEVANT LABS AND IMAGING: ESOPHOGRAM / BARIUM SWALLOW / BARIUM TABLET STUDY   TECHNIQUE: Combined double contrast and single contrast examination performed using effervescent crystals, thick barium liquid, and thin barium liquid. The patient was observed with fluoroscopy swallowing a 13 mm barium sulphate tablet.   FLUOROSCOPY TIME:  Fluoroscopy Time:  1 minute 42 second   Radiation Exposure Index (if provided by the fluoroscopic device):   Number of Acquired Spot Images: 0   COMPARISON:  None.   FINDINGS: Mild decreased esophageal peristalsis. Small hiatal hernia. Mild narrowing distal esophagus with a benign appearance. This is at the GE junction and is smooth. Barium tablet initially held up at this level but after approximately 1 minute and additional water, barium tablet did pass into the stomach.   Small hiatal hernia without reflux identified.   IMPRESSION: Small hiatal hernia. Mild benign stricture at the GE junction. Barium tablet initially was slow to  pass this area but after approximately 1 minute and additional water, the tablet passed into the stomach.     Electronically  Signed   By: Franchot Gallo M.D.   On: 09/27/2017 14:16    ASSESSMENT/PLAN: 84 year old female with a past medical history of adenomatous colon polyps, IBS, GERD with esophageal stricture, atrial fibrillation, COPD on oxygen who is seen in consult at the request of Dr. Philip Aspen to discuss esophageal dysphagia.   1.  Esophageal dysphagia with esophageal stricture --patient is requesting upper endoscopy with dilation, which is reasonable given her solid food dysphagia.  We discussed the risk, benefits and alternatives and she is agreeable and wishes to proceed.  This will need to be performed in the outpatient hospital setting due to her home oxygen use and medical comorbidities.  2.  History of adenomatous colon polyps --no polyps at the time of surveillance colonoscopy in 2014.  Future screening/surveillance procedures deferred due to advanced age.  This is also the patient's wish to not perform future colonoscopies for this indication.      TS:[id number], Quillian Quince, [place], [place] [id number]

## 2018-04-10 DIAGNOSIS — D469 Myelodysplastic syndrome, unspecified: Secondary | ICD-10-CM | POA: Diagnosis not present

## 2018-04-10 DIAGNOSIS — I10 Essential (primary) hypertension: Secondary | ICD-10-CM | POA: Diagnosis not present

## 2018-04-10 DIAGNOSIS — J449 Chronic obstructive pulmonary disease, unspecified: Secondary | ICD-10-CM | POA: Diagnosis not present

## 2018-04-10 DIAGNOSIS — I4891 Unspecified atrial fibrillation: Secondary | ICD-10-CM | POA: Diagnosis not present

## 2018-04-10 DIAGNOSIS — I25810 Atherosclerosis of coronary artery bypass graft(s) without angina pectoris: Secondary | ICD-10-CM | POA: Diagnosis not present

## 2018-04-10 DIAGNOSIS — I5030 Unspecified diastolic (congestive) heart failure: Secondary | ICD-10-CM | POA: Diagnosis not present

## 2018-04-13 DIAGNOSIS — I5030 Unspecified diastolic (congestive) heart failure: Secondary | ICD-10-CM | POA: Diagnosis not present

## 2018-04-13 DIAGNOSIS — I25810 Atherosclerosis of coronary artery bypass graft(s) without angina pectoris: Secondary | ICD-10-CM | POA: Diagnosis not present

## 2018-04-13 DIAGNOSIS — I10 Essential (primary) hypertension: Secondary | ICD-10-CM | POA: Diagnosis not present

## 2018-04-13 DIAGNOSIS — I4891 Unspecified atrial fibrillation: Secondary | ICD-10-CM | POA: Diagnosis not present

## 2018-04-13 DIAGNOSIS — D469 Myelodysplastic syndrome, unspecified: Secondary | ICD-10-CM | POA: Diagnosis not present

## 2018-04-13 DIAGNOSIS — J449 Chronic obstructive pulmonary disease, unspecified: Secondary | ICD-10-CM | POA: Diagnosis not present

## 2018-04-19 DIAGNOSIS — D72818 Other decreased white blood cell count: Secondary | ICD-10-CM | POA: Diagnosis not present

## 2018-04-19 DIAGNOSIS — D469 Myelodysplastic syndrome, unspecified: Secondary | ICD-10-CM | POA: Diagnosis not present

## 2018-04-19 DIAGNOSIS — I5030 Unspecified diastolic (congestive) heart failure: Secondary | ICD-10-CM | POA: Diagnosis not present

## 2018-04-19 DIAGNOSIS — J449 Chronic obstructive pulmonary disease, unspecified: Secondary | ICD-10-CM | POA: Diagnosis not present

## 2018-04-19 DIAGNOSIS — R0902 Hypoxemia: Secondary | ICD-10-CM | POA: Diagnosis not present

## 2018-04-19 DIAGNOSIS — I4891 Unspecified atrial fibrillation: Secondary | ICD-10-CM | POA: Diagnosis not present

## 2018-04-19 DIAGNOSIS — J918 Pleural effusion in other conditions classified elsewhere: Secondary | ICD-10-CM | POA: Diagnosis not present

## 2018-04-19 DIAGNOSIS — K589 Irritable bowel syndrome without diarrhea: Secondary | ICD-10-CM | POA: Diagnosis not present

## 2018-04-19 DIAGNOSIS — K831 Obstruction of bile duct: Secondary | ICD-10-CM | POA: Diagnosis not present

## 2018-04-19 DIAGNOSIS — K219 Gastro-esophageal reflux disease without esophagitis: Secondary | ICD-10-CM | POA: Diagnosis not present

## 2018-04-19 DIAGNOSIS — R54 Age-related physical debility: Secondary | ICD-10-CM | POA: Diagnosis not present

## 2018-04-19 DIAGNOSIS — D630 Anemia in neoplastic disease: Secondary | ICD-10-CM | POA: Diagnosis not present

## 2018-04-19 DIAGNOSIS — I10 Essential (primary) hypertension: Secondary | ICD-10-CM | POA: Diagnosis not present

## 2018-04-19 DIAGNOSIS — H11149 Conjunctival xerosis, unspecified, unspecified eye: Secondary | ICD-10-CM | POA: Diagnosis not present

## 2018-04-19 DIAGNOSIS — I25810 Atherosclerosis of coronary artery bypass graft(s) without angina pectoris: Secondary | ICD-10-CM | POA: Diagnosis not present

## 2018-04-19 DIAGNOSIS — D473 Essential (hemorrhagic) thrombocythemia: Secondary | ICD-10-CM | POA: Diagnosis not present

## 2018-04-20 ENCOUNTER — Encounter: Payer: Self-pay | Admitting: Internal Medicine

## 2018-04-20 DIAGNOSIS — I10 Essential (primary) hypertension: Secondary | ICD-10-CM | POA: Diagnosis not present

## 2018-04-20 DIAGNOSIS — I5030 Unspecified diastolic (congestive) heart failure: Secondary | ICD-10-CM | POA: Diagnosis not present

## 2018-04-20 DIAGNOSIS — D469 Myelodysplastic syndrome, unspecified: Secondary | ICD-10-CM | POA: Diagnosis not present

## 2018-04-20 DIAGNOSIS — J449 Chronic obstructive pulmonary disease, unspecified: Secondary | ICD-10-CM | POA: Diagnosis not present

## 2018-04-20 DIAGNOSIS — I25810 Atherosclerosis of coronary artery bypass graft(s) without angina pectoris: Secondary | ICD-10-CM | POA: Diagnosis not present

## 2018-04-20 DIAGNOSIS — I4891 Unspecified atrial fibrillation: Secondary | ICD-10-CM | POA: Diagnosis not present

## 2018-04-20 NOTE — Progress Notes (Signed)
A user error has taken place: encounter opened in error, closed for administrative reasons.

## 2018-04-21 DIAGNOSIS — J449 Chronic obstructive pulmonary disease, unspecified: Secondary | ICD-10-CM | POA: Diagnosis not present

## 2018-04-21 DIAGNOSIS — D469 Myelodysplastic syndrome, unspecified: Secondary | ICD-10-CM | POA: Diagnosis not present

## 2018-04-21 DIAGNOSIS — I25810 Atherosclerosis of coronary artery bypass graft(s) without angina pectoris: Secondary | ICD-10-CM | POA: Diagnosis not present

## 2018-04-21 DIAGNOSIS — I4891 Unspecified atrial fibrillation: Secondary | ICD-10-CM | POA: Diagnosis not present

## 2018-04-21 DIAGNOSIS — I5030 Unspecified diastolic (congestive) heart failure: Secondary | ICD-10-CM | POA: Diagnosis not present

## 2018-04-21 DIAGNOSIS — I10 Essential (primary) hypertension: Secondary | ICD-10-CM | POA: Diagnosis not present

## 2018-04-23 DIAGNOSIS — I10 Essential (primary) hypertension: Secondary | ICD-10-CM | POA: Diagnosis not present

## 2018-04-23 DIAGNOSIS — D469 Myelodysplastic syndrome, unspecified: Secondary | ICD-10-CM | POA: Diagnosis not present

## 2018-04-23 DIAGNOSIS — I5030 Unspecified diastolic (congestive) heart failure: Secondary | ICD-10-CM | POA: Diagnosis not present

## 2018-04-23 DIAGNOSIS — I25810 Atherosclerosis of coronary artery bypass graft(s) without angina pectoris: Secondary | ICD-10-CM | POA: Diagnosis not present

## 2018-04-23 DIAGNOSIS — J449 Chronic obstructive pulmonary disease, unspecified: Secondary | ICD-10-CM | POA: Diagnosis not present

## 2018-04-23 DIAGNOSIS — I4891 Unspecified atrial fibrillation: Secondary | ICD-10-CM | POA: Diagnosis not present

## 2018-04-26 DIAGNOSIS — D469 Myelodysplastic syndrome, unspecified: Secondary | ICD-10-CM | POA: Diagnosis not present

## 2018-04-26 DIAGNOSIS — I5030 Unspecified diastolic (congestive) heart failure: Secondary | ICD-10-CM | POA: Diagnosis not present

## 2018-04-26 DIAGNOSIS — I4891 Unspecified atrial fibrillation: Secondary | ICD-10-CM | POA: Diagnosis not present

## 2018-04-26 DIAGNOSIS — I25810 Atherosclerosis of coronary artery bypass graft(s) without angina pectoris: Secondary | ICD-10-CM | POA: Diagnosis not present

## 2018-04-26 DIAGNOSIS — I10 Essential (primary) hypertension: Secondary | ICD-10-CM | POA: Diagnosis not present

## 2018-04-26 DIAGNOSIS — J449 Chronic obstructive pulmonary disease, unspecified: Secondary | ICD-10-CM | POA: Diagnosis not present

## 2018-04-27 ENCOUNTER — Telehealth: Payer: Self-pay | Admitting: Internal Medicine

## 2018-04-27 NOTE — Telephone Encounter (Signed)
Pt's daughter Elvin So called to cancel pt's EGD scheduled on 05/22/18 at [facility]. She said that they have decided not to have procedure at this time. She will cb to r/s.

## 2018-04-30 NOTE — Telephone Encounter (Signed)
Procedure cancelled as requested.

## 2018-05-03 DIAGNOSIS — J9611 Chronic respiratory failure with hypoxia: Secondary | ICD-10-CM | POA: Diagnosis not present

## 2018-05-03 DIAGNOSIS — F411 Generalized anxiety disorder: Secondary | ICD-10-CM | POA: Diagnosis not present

## 2018-05-03 DIAGNOSIS — R278 Other lack of coordination: Secondary | ICD-10-CM | POA: Diagnosis not present

## 2018-05-03 DIAGNOSIS — M6389 Disorders of muscle in diseases classified elsewhere, multiple sites: Secondary | ICD-10-CM | POA: Diagnosis not present

## 2018-05-03 DIAGNOSIS — R296 Repeated falls: Secondary | ICD-10-CM | POA: Diagnosis not present

## 2018-05-03 DIAGNOSIS — I5032 Chronic diastolic (congestive) heart failure: Secondary | ICD-10-CM | POA: Diagnosis not present

## 2018-05-04 DIAGNOSIS — D469 Myelodysplastic syndrome, unspecified: Secondary | ICD-10-CM | POA: Diagnosis not present

## 2018-05-04 DIAGNOSIS — J449 Chronic obstructive pulmonary disease, unspecified: Secondary | ICD-10-CM | POA: Diagnosis not present

## 2018-05-04 DIAGNOSIS — I25810 Atherosclerosis of coronary artery bypass graft(s) without angina pectoris: Secondary | ICD-10-CM | POA: Diagnosis not present

## 2018-05-04 DIAGNOSIS — I5030 Unspecified diastolic (congestive) heart failure: Secondary | ICD-10-CM | POA: Diagnosis not present

## 2018-05-04 DIAGNOSIS — I10 Essential (primary) hypertension: Secondary | ICD-10-CM | POA: Diagnosis not present

## 2018-05-04 DIAGNOSIS — I4891 Unspecified atrial fibrillation: Secondary | ICD-10-CM | POA: Diagnosis not present

## 2018-05-07 DIAGNOSIS — M6389 Disorders of muscle in diseases classified elsewhere, multiple sites: Secondary | ICD-10-CM | POA: Diagnosis not present

## 2018-05-07 DIAGNOSIS — F411 Generalized anxiety disorder: Secondary | ICD-10-CM | POA: Diagnosis not present

## 2018-05-07 DIAGNOSIS — I5032 Chronic diastolic (congestive) heart failure: Secondary | ICD-10-CM | POA: Diagnosis not present

## 2018-05-07 DIAGNOSIS — R278 Other lack of coordination: Secondary | ICD-10-CM | POA: Diagnosis not present

## 2018-05-07 DIAGNOSIS — R296 Repeated falls: Secondary | ICD-10-CM | POA: Diagnosis not present

## 2018-05-07 DIAGNOSIS — J9611 Chronic respiratory failure with hypoxia: Secondary | ICD-10-CM | POA: Diagnosis not present

## 2018-05-08 DIAGNOSIS — D469 Myelodysplastic syndrome, unspecified: Secondary | ICD-10-CM | POA: Diagnosis not present

## 2018-05-08 DIAGNOSIS — M6389 Disorders of muscle in diseases classified elsewhere, multiple sites: Secondary | ICD-10-CM | POA: Diagnosis not present

## 2018-05-08 DIAGNOSIS — R296 Repeated falls: Secondary | ICD-10-CM | POA: Diagnosis not present

## 2018-05-08 DIAGNOSIS — J9611 Chronic respiratory failure with hypoxia: Secondary | ICD-10-CM | POA: Diagnosis not present

## 2018-05-08 DIAGNOSIS — I4891 Unspecified atrial fibrillation: Secondary | ICD-10-CM | POA: Diagnosis not present

## 2018-05-08 DIAGNOSIS — I5032 Chronic diastolic (congestive) heart failure: Secondary | ICD-10-CM | POA: Diagnosis not present

## 2018-05-08 DIAGNOSIS — F411 Generalized anxiety disorder: Secondary | ICD-10-CM | POA: Diagnosis not present

## 2018-05-08 DIAGNOSIS — I10 Essential (primary) hypertension: Secondary | ICD-10-CM | POA: Diagnosis not present

## 2018-05-08 DIAGNOSIS — I25810 Atherosclerosis of coronary artery bypass graft(s) without angina pectoris: Secondary | ICD-10-CM | POA: Diagnosis not present

## 2018-05-08 DIAGNOSIS — I5030 Unspecified diastolic (congestive) heart failure: Secondary | ICD-10-CM | POA: Diagnosis not present

## 2018-05-08 DIAGNOSIS — R278 Other lack of coordination: Secondary | ICD-10-CM | POA: Diagnosis not present

## 2018-05-08 DIAGNOSIS — J449 Chronic obstructive pulmonary disease, unspecified: Secondary | ICD-10-CM | POA: Diagnosis not present

## 2018-05-09 DIAGNOSIS — I10 Essential (primary) hypertension: Secondary | ICD-10-CM | POA: Diagnosis not present

## 2018-05-09 DIAGNOSIS — I4891 Unspecified atrial fibrillation: Secondary | ICD-10-CM | POA: Diagnosis not present

## 2018-05-09 DIAGNOSIS — I5030 Unspecified diastolic (congestive) heart failure: Secondary | ICD-10-CM | POA: Diagnosis not present

## 2018-05-09 DIAGNOSIS — D469 Myelodysplastic syndrome, unspecified: Secondary | ICD-10-CM | POA: Diagnosis not present

## 2018-05-09 DIAGNOSIS — J449 Chronic obstructive pulmonary disease, unspecified: Secondary | ICD-10-CM | POA: Diagnosis not present

## 2018-05-09 DIAGNOSIS — I25810 Atherosclerosis of coronary artery bypass graft(s) without angina pectoris: Secondary | ICD-10-CM | POA: Diagnosis not present

## 2018-05-11 DIAGNOSIS — M6389 Disorders of muscle in diseases classified elsewhere, multiple sites: Secondary | ICD-10-CM | POA: Diagnosis not present

## 2018-05-11 DIAGNOSIS — I5030 Unspecified diastolic (congestive) heart failure: Secondary | ICD-10-CM | POA: Diagnosis not present

## 2018-05-11 DIAGNOSIS — I5032 Chronic diastolic (congestive) heart failure: Secondary | ICD-10-CM | POA: Diagnosis not present

## 2018-05-11 DIAGNOSIS — J9611 Chronic respiratory failure with hypoxia: Secondary | ICD-10-CM | POA: Diagnosis not present

## 2018-05-11 DIAGNOSIS — J449 Chronic obstructive pulmonary disease, unspecified: Secondary | ICD-10-CM | POA: Diagnosis not present

## 2018-05-11 DIAGNOSIS — F411 Generalized anxiety disorder: Secondary | ICD-10-CM | POA: Diagnosis not present

## 2018-05-11 DIAGNOSIS — R278 Other lack of coordination: Secondary | ICD-10-CM | POA: Diagnosis not present

## 2018-05-11 DIAGNOSIS — I25810 Atherosclerosis of coronary artery bypass graft(s) without angina pectoris: Secondary | ICD-10-CM | POA: Diagnosis not present

## 2018-05-11 DIAGNOSIS — I10 Essential (primary) hypertension: Secondary | ICD-10-CM | POA: Diagnosis not present

## 2018-05-11 DIAGNOSIS — D469 Myelodysplastic syndrome, unspecified: Secondary | ICD-10-CM | POA: Diagnosis not present

## 2018-05-11 DIAGNOSIS — I4891 Unspecified atrial fibrillation: Secondary | ICD-10-CM | POA: Diagnosis not present

## 2018-05-11 DIAGNOSIS — R296 Repeated falls: Secondary | ICD-10-CM | POA: Diagnosis not present

## 2018-05-16 DIAGNOSIS — J9611 Chronic respiratory failure with hypoxia: Secondary | ICD-10-CM | POA: Diagnosis not present

## 2018-05-16 DIAGNOSIS — F411 Generalized anxiety disorder: Secondary | ICD-10-CM | POA: Diagnosis not present

## 2018-05-16 DIAGNOSIS — M6389 Disorders of muscle in diseases classified elsewhere, multiple sites: Secondary | ICD-10-CM | POA: Diagnosis not present

## 2018-05-16 DIAGNOSIS — R278 Other lack of coordination: Secondary | ICD-10-CM | POA: Diagnosis not present

## 2018-05-16 DIAGNOSIS — R296 Repeated falls: Secondary | ICD-10-CM | POA: Diagnosis not present

## 2018-05-16 DIAGNOSIS — I5032 Chronic diastolic (congestive) heart failure: Secondary | ICD-10-CM | POA: Diagnosis not present

## 2018-05-17 DIAGNOSIS — I10 Essential (primary) hypertension: Secondary | ICD-10-CM | POA: Diagnosis not present

## 2018-05-17 DIAGNOSIS — I4891 Unspecified atrial fibrillation: Secondary | ICD-10-CM | POA: Diagnosis not present

## 2018-05-17 DIAGNOSIS — I5030 Unspecified diastolic (congestive) heart failure: Secondary | ICD-10-CM | POA: Diagnosis not present

## 2018-05-17 DIAGNOSIS — J449 Chronic obstructive pulmonary disease, unspecified: Secondary | ICD-10-CM | POA: Diagnosis not present

## 2018-05-17 DIAGNOSIS — I25810 Atherosclerosis of coronary artery bypass graft(s) without angina pectoris: Secondary | ICD-10-CM | POA: Diagnosis not present

## 2018-05-17 DIAGNOSIS — D469 Myelodysplastic syndrome, unspecified: Secondary | ICD-10-CM | POA: Diagnosis not present

## 2018-05-20 DIAGNOSIS — D473 Essential (hemorrhagic) thrombocythemia: Secondary | ICD-10-CM | POA: Diagnosis not present

## 2018-05-20 DIAGNOSIS — K589 Irritable bowel syndrome without diarrhea: Secondary | ICD-10-CM | POA: Diagnosis not present

## 2018-05-20 DIAGNOSIS — I5030 Unspecified diastolic (congestive) heart failure: Secondary | ICD-10-CM | POA: Diagnosis not present

## 2018-05-20 DIAGNOSIS — I4891 Unspecified atrial fibrillation: Secondary | ICD-10-CM | POA: Diagnosis not present

## 2018-05-20 DIAGNOSIS — J918 Pleural effusion in other conditions classified elsewhere: Secondary | ICD-10-CM | POA: Diagnosis not present

## 2018-05-20 DIAGNOSIS — J449 Chronic obstructive pulmonary disease, unspecified: Secondary | ICD-10-CM | POA: Diagnosis not present

## 2018-05-20 DIAGNOSIS — I25810 Atherosclerosis of coronary artery bypass graft(s) without angina pectoris: Secondary | ICD-10-CM | POA: Diagnosis not present

## 2018-05-20 DIAGNOSIS — R0902 Hypoxemia: Secondary | ICD-10-CM | POA: Diagnosis not present

## 2018-05-20 DIAGNOSIS — H11149 Conjunctival xerosis, unspecified, unspecified eye: Secondary | ICD-10-CM | POA: Diagnosis not present

## 2018-05-20 DIAGNOSIS — K219 Gastro-esophageal reflux disease without esophagitis: Secondary | ICD-10-CM | POA: Diagnosis not present

## 2018-05-20 DIAGNOSIS — R54 Age-related physical debility: Secondary | ICD-10-CM | POA: Diagnosis not present

## 2018-05-20 DIAGNOSIS — D72818 Other decreased white blood cell count: Secondary | ICD-10-CM | POA: Diagnosis not present

## 2018-05-20 DIAGNOSIS — D469 Myelodysplastic syndrome, unspecified: Secondary | ICD-10-CM | POA: Diagnosis not present

## 2018-05-20 DIAGNOSIS — I10 Essential (primary) hypertension: Secondary | ICD-10-CM | POA: Diagnosis not present

## 2018-05-20 DIAGNOSIS — D630 Anemia in neoplastic disease: Secondary | ICD-10-CM | POA: Diagnosis not present

## 2018-05-20 DIAGNOSIS — K831 Obstruction of bile duct: Secondary | ICD-10-CM | POA: Diagnosis not present

## 2018-05-22 ENCOUNTER — Encounter (HOSPITAL_COMMUNITY): Payer: Self-pay

## 2018-05-22 ENCOUNTER — Ambulatory Visit (HOSPITAL_COMMUNITY): Admit: 2018-05-22 | Payer: Medicare Other | Admitting: Internal Medicine

## 2018-05-22 DIAGNOSIS — F411 Generalized anxiety disorder: Secondary | ICD-10-CM | POA: Diagnosis not present

## 2018-05-22 DIAGNOSIS — I5030 Unspecified diastolic (congestive) heart failure: Secondary | ICD-10-CM | POA: Diagnosis not present

## 2018-05-22 DIAGNOSIS — I5032 Chronic diastolic (congestive) heart failure: Secondary | ICD-10-CM | POA: Diagnosis not present

## 2018-05-22 DIAGNOSIS — I10 Essential (primary) hypertension: Secondary | ICD-10-CM | POA: Diagnosis not present

## 2018-05-22 DIAGNOSIS — J449 Chronic obstructive pulmonary disease, unspecified: Secondary | ICD-10-CM | POA: Diagnosis not present

## 2018-05-22 DIAGNOSIS — J9611 Chronic respiratory failure with hypoxia: Secondary | ICD-10-CM | POA: Diagnosis not present

## 2018-05-22 DIAGNOSIS — R296 Repeated falls: Secondary | ICD-10-CM | POA: Diagnosis not present

## 2018-05-22 DIAGNOSIS — D469 Myelodysplastic syndrome, unspecified: Secondary | ICD-10-CM | POA: Diagnosis not present

## 2018-05-22 DIAGNOSIS — I4891 Unspecified atrial fibrillation: Secondary | ICD-10-CM | POA: Diagnosis not present

## 2018-05-22 DIAGNOSIS — R278 Other lack of coordination: Secondary | ICD-10-CM | POA: Diagnosis not present

## 2018-05-22 DIAGNOSIS — I25810 Atherosclerosis of coronary artery bypass graft(s) without angina pectoris: Secondary | ICD-10-CM | POA: Diagnosis not present

## 2018-05-22 DIAGNOSIS — M6389 Disorders of muscle in diseases classified elsewhere, multiple sites: Secondary | ICD-10-CM | POA: Diagnosis not present

## 2018-05-22 SURGERY — ESOPHAGOGASTRODUODENOSCOPY (EGD) WITH PROPOFOL
Anesthesia: Monitor Anesthesia Care

## 2018-05-23 DIAGNOSIS — I10 Essential (primary) hypertension: Secondary | ICD-10-CM | POA: Diagnosis not present

## 2018-05-23 DIAGNOSIS — D469 Myelodysplastic syndrome, unspecified: Secondary | ICD-10-CM | POA: Diagnosis not present

## 2018-05-23 DIAGNOSIS — I5032 Chronic diastolic (congestive) heart failure: Secondary | ICD-10-CM | POA: Diagnosis not present

## 2018-05-23 DIAGNOSIS — J449 Chronic obstructive pulmonary disease, unspecified: Secondary | ICD-10-CM | POA: Diagnosis not present

## 2018-05-23 DIAGNOSIS — I5030 Unspecified diastolic (congestive) heart failure: Secondary | ICD-10-CM | POA: Diagnosis not present

## 2018-05-23 DIAGNOSIS — M6389 Disorders of muscle in diseases classified elsewhere, multiple sites: Secondary | ICD-10-CM | POA: Diagnosis not present

## 2018-05-23 DIAGNOSIS — I25810 Atherosclerosis of coronary artery bypass graft(s) without angina pectoris: Secondary | ICD-10-CM | POA: Diagnosis not present

## 2018-05-23 DIAGNOSIS — R278 Other lack of coordination: Secondary | ICD-10-CM | POA: Diagnosis not present

## 2018-05-23 DIAGNOSIS — I4891 Unspecified atrial fibrillation: Secondary | ICD-10-CM | POA: Diagnosis not present

## 2018-05-23 DIAGNOSIS — R296 Repeated falls: Secondary | ICD-10-CM | POA: Diagnosis not present

## 2018-05-23 DIAGNOSIS — F411 Generalized anxiety disorder: Secondary | ICD-10-CM | POA: Diagnosis not present

## 2018-05-23 DIAGNOSIS — J9611 Chronic respiratory failure with hypoxia: Secondary | ICD-10-CM | POA: Diagnosis not present

## 2018-05-25 DIAGNOSIS — I4891 Unspecified atrial fibrillation: Secondary | ICD-10-CM | POA: Diagnosis not present

## 2018-05-25 DIAGNOSIS — I10 Essential (primary) hypertension: Secondary | ICD-10-CM | POA: Diagnosis not present

## 2018-05-25 DIAGNOSIS — I5032 Chronic diastolic (congestive) heart failure: Secondary | ICD-10-CM | POA: Diagnosis not present

## 2018-05-25 DIAGNOSIS — R278 Other lack of coordination: Secondary | ICD-10-CM | POA: Diagnosis not present

## 2018-05-25 DIAGNOSIS — F411 Generalized anxiety disorder: Secondary | ICD-10-CM | POA: Diagnosis not present

## 2018-05-25 DIAGNOSIS — I5030 Unspecified diastolic (congestive) heart failure: Secondary | ICD-10-CM | POA: Diagnosis not present

## 2018-05-25 DIAGNOSIS — J449 Chronic obstructive pulmonary disease, unspecified: Secondary | ICD-10-CM | POA: Diagnosis not present

## 2018-05-25 DIAGNOSIS — R296 Repeated falls: Secondary | ICD-10-CM | POA: Diagnosis not present

## 2018-05-25 DIAGNOSIS — M6389 Disorders of muscle in diseases classified elsewhere, multiple sites: Secondary | ICD-10-CM | POA: Diagnosis not present

## 2018-05-25 DIAGNOSIS — D469 Myelodysplastic syndrome, unspecified: Secondary | ICD-10-CM | POA: Diagnosis not present

## 2018-05-25 DIAGNOSIS — I25810 Atherosclerosis of coronary artery bypass graft(s) without angina pectoris: Secondary | ICD-10-CM | POA: Diagnosis not present

## 2018-05-25 DIAGNOSIS — J9611 Chronic respiratory failure with hypoxia: Secondary | ICD-10-CM | POA: Diagnosis not present

## 2018-05-28 DIAGNOSIS — R278 Other lack of coordination: Secondary | ICD-10-CM | POA: Diagnosis not present

## 2018-05-28 DIAGNOSIS — M6389 Disorders of muscle in diseases classified elsewhere, multiple sites: Secondary | ICD-10-CM | POA: Diagnosis not present

## 2018-05-28 DIAGNOSIS — J9611 Chronic respiratory failure with hypoxia: Secondary | ICD-10-CM | POA: Diagnosis not present

## 2018-05-28 DIAGNOSIS — R296 Repeated falls: Secondary | ICD-10-CM | POA: Diagnosis not present

## 2018-05-28 DIAGNOSIS — I5032 Chronic diastolic (congestive) heart failure: Secondary | ICD-10-CM | POA: Diagnosis not present

## 2018-05-28 DIAGNOSIS — F411 Generalized anxiety disorder: Secondary | ICD-10-CM | POA: Diagnosis not present

## 2018-05-31 DIAGNOSIS — D469 Myelodysplastic syndrome, unspecified: Secondary | ICD-10-CM | POA: Diagnosis not present

## 2018-05-31 DIAGNOSIS — I4891 Unspecified atrial fibrillation: Secondary | ICD-10-CM | POA: Diagnosis not present

## 2018-05-31 DIAGNOSIS — I5030 Unspecified diastolic (congestive) heart failure: Secondary | ICD-10-CM | POA: Diagnosis not present

## 2018-05-31 DIAGNOSIS — J449 Chronic obstructive pulmonary disease, unspecified: Secondary | ICD-10-CM | POA: Diagnosis not present

## 2018-05-31 DIAGNOSIS — I10 Essential (primary) hypertension: Secondary | ICD-10-CM | POA: Diagnosis not present

## 2018-05-31 DIAGNOSIS — I25810 Atherosclerosis of coronary artery bypass graft(s) without angina pectoris: Secondary | ICD-10-CM | POA: Diagnosis not present

## 2018-06-01 DIAGNOSIS — R278 Other lack of coordination: Secondary | ICD-10-CM | POA: Diagnosis not present

## 2018-06-01 DIAGNOSIS — I5032 Chronic diastolic (congestive) heart failure: Secondary | ICD-10-CM | POA: Diagnosis not present

## 2018-06-01 DIAGNOSIS — J9611 Chronic respiratory failure with hypoxia: Secondary | ICD-10-CM | POA: Diagnosis not present

## 2018-06-01 DIAGNOSIS — R296 Repeated falls: Secondary | ICD-10-CM | POA: Diagnosis not present

## 2018-06-01 DIAGNOSIS — F411 Generalized anxiety disorder: Secondary | ICD-10-CM | POA: Diagnosis not present

## 2018-06-01 DIAGNOSIS — M6389 Disorders of muscle in diseases classified elsewhere, multiple sites: Secondary | ICD-10-CM | POA: Diagnosis not present

## 2018-06-04 DIAGNOSIS — M6389 Disorders of muscle in diseases classified elsewhere, multiple sites: Secondary | ICD-10-CM | POA: Diagnosis not present

## 2018-06-04 DIAGNOSIS — R278 Other lack of coordination: Secondary | ICD-10-CM | POA: Diagnosis not present

## 2018-06-04 DIAGNOSIS — F411 Generalized anxiety disorder: Secondary | ICD-10-CM | POA: Diagnosis not present

## 2018-06-04 DIAGNOSIS — R296 Repeated falls: Secondary | ICD-10-CM | POA: Diagnosis not present

## 2018-06-04 DIAGNOSIS — I5032 Chronic diastolic (congestive) heart failure: Secondary | ICD-10-CM | POA: Diagnosis not present

## 2018-06-04 DIAGNOSIS — J9611 Chronic respiratory failure with hypoxia: Secondary | ICD-10-CM | POA: Diagnosis not present

## 2018-06-05 ENCOUNTER — Encounter: Payer: Self-pay | Admitting: Internal Medicine

## 2018-06-05 ENCOUNTER — Non-Acute Institutional Stay (SKILLED_NURSING_FACILITY): Payer: Medicare Other | Admitting: Internal Medicine

## 2018-06-05 DIAGNOSIS — J9611 Chronic respiratory failure with hypoxia: Secondary | ICD-10-CM | POA: Diagnosis not present

## 2018-06-05 DIAGNOSIS — D469 Myelodysplastic syndrome, unspecified: Secondary | ICD-10-CM | POA: Diagnosis not present

## 2018-06-05 DIAGNOSIS — I5032 Chronic diastolic (congestive) heart failure: Secondary | ICD-10-CM | POA: Diagnosis not present

## 2018-06-05 DIAGNOSIS — J449 Chronic obstructive pulmonary disease, unspecified: Secondary | ICD-10-CM | POA: Diagnosis not present

## 2018-06-05 DIAGNOSIS — H401130 Primary open-angle glaucoma, bilateral, stage unspecified: Secondary | ICD-10-CM | POA: Diagnosis not present

## 2018-06-05 DIAGNOSIS — D708 Other neutropenia: Secondary | ICD-10-CM

## 2018-06-05 DIAGNOSIS — H9113 Presbycusis, bilateral: Secondary | ICD-10-CM | POA: Diagnosis not present

## 2018-06-05 NOTE — Progress Notes (Signed)
Patient ID: Lori [name], female   DOB: 1926-06-28, 84 y.o.   MRN: [id number]  Location:  [place] Room Number: 458 Place of Service:  SNF (31) Provider:   Gayland Curry, DO  Patient Care Team: Gayland Curry, DO as PCP - General (Geriatric Medicine) Brand Males, MD as Consulting Physician (Pulmonary Disease) [name], MD as Consulting Physician (Orthopedic Surgery)  No emergency contact information on file.  Code Status:  DNR, MOST, hospice Goals of care: Advanced Directive information Advanced Directives 06/05/2018  Does Patient Have a Medical Advance Directive? Yes  Type of Paramedic of [place];Living will;Out of facility DNR (pink MOST or yellow form)  Does patient want to make changes to medical advance directive? No - Patient declined  Copy of [place] in Chart? Yes  Would patient like information on creating a medical advance directive? -  Pre-existing out of facility DNR order (yellow form or pink MOST form) Yellow form placed in chart (order not valid for inpatient use);Pink MOST form placed in chart (order not valid for inpatient use)     Chief Complaint  Patient presents with  . Medical Management of Chronic Issues    routine visit    HPI:  Pt is a 84 y.o. female seen today for medical management of chronic diseases.  She has switched back to [facility] from Dr. Philip Aspen.  Met with Karma Greaser today.  She is doing very well.  She reports no pain.  She's amazed how long she has lived and how well she has done.  She remains on hospice care for her myelodysplastic syndrome and COPD.  She does get dyspneic on exertion, but walks quite a bit.  She is embarrassed when she's had syncopal episodes.  These have been worked up in the past with a holter monitor with no clear cause.    She'd been on anagrelide for her MDS through hematology, but opted to stop it due to the prolonged time it took to  get the infusions and how it did not help her to feel any better.  Discussed role was to prevent clotting with her high platelet counts.    Past Medical History:  Diagnosis Date  . ALLERGIC RHINITIS   . Anxiety   . Asthma   . Atrial fibrillation ([place])   . Back pain of thoracolumbar region    right  . Bronchiectasis   . Colles' fracture of left radius   . COPD (chronic obstructive pulmonary disease) ([place])   . Coronary artery disease   . Diastolic dysfunction   . Diverticulosis   . Dysrhythmia   . Esophageal stricture   . Hiatal hernia   . History of pneumonia   . Hx of cardiovascular stress test    a. ETT-MV 1/14: Exercised 4:16, no ECG changes, poor ex tol, ant defect c/w soft tissue atten, no ischemia, EF 75%  . Hx of colonic polyp   . Hyperlipidemia   . Hypertension   . IBS (irritable bowel syndrome)   . Insomnia   . Multiple rib fractures 10/2008   bilateral  . Pleural effusion, left   . Pruritus   . Pulmonary nodule   . Syncope    Past Surgical History:  Procedure Laterality Date  . ANKLE CLOSED REDUCTION Right 12/11/2014   Procedure: CLOSED REDUCTION ANKLE;  Surgeon: Rod Can, MD;  Location: [place];  Service: Orthopedics;  Laterality: Right;  . APPENDECTOMY    .  BLADDER SUSPENSION  2005   A-P  . CHOLECYSTECTOMY    . COLONOSCOPY  04/06/2012   Procedure: COLONOSCOPY;  Surgeon: Jerene Bears, MD;  [facility];  Service: Gastroenterology;  Laterality: N/A;  . EXTERNAL FIXATION LEG Right 12/12/2014   Procedure: ADJUSTMENT OF EXTERNAL FIXATION  RIGHT ANKLE;  Surgeon: Rod Can, MD;  Location: [place];  Service: Orthopedics;  Laterality: Right;  . EXTERNAL FIXATION LEG Right 12/11/2014   Procedure: POSSIBLE EXTERNAL FIXATION RIGHT ANKLE;  Surgeon: Rod Can, MD;  Location: [place];  Service: Orthopedics;  Laterality: Right;  . HARDWARE REMOVAL Right 12/25/2014   Procedure: RIGHT ANKLE REMOVE OF DEEP IMPLANT ANE EXTERNAL FIXATOR ;  Surgeon: [name], MD;   Location: [place];  Service: Orthopedics;  Laterality: Right;  . ORIF ANKLE FRACTURE Right 12/25/2014   Procedure: OPEN REDUCTION INTERNAL FIXATION (ORIF)TRIMALLEOLAR  ANKLE FRACTURE;  Surgeon: [name], MD;  Location: [place];  Service: Orthopedics;  Laterality: Right;  . ORIF WRIST FRACTURE Left 2010     Dr. Burney Gauze.  Marland Kitchen POLYPECTOMY    . ROTATOR CUFF REPAIR Right 2006  . TONSILLECTOMY    . TOTAL KNEE ARTHROPLASTY Right 08/2010    Allergies  Allergen Reactions  . Albuterol Sulfate Palpitations  . Levaquin [Levofloxacin In D5w] Other (See Comments)    QT prolongation. Should avoid all flouroquinolones.   . Lactose Intolerance (Gi) Other (See Comments)  . Amoxicillin Other (See Comments)    REACTION: unspecified  . Codeine Other (See Comments)    Can take Hydrocodone  . Penicillins Hives, Itching and Rash    Hard lump and rash Has patient had a PCN reaction causing immediate rash, facial/tongue/throat swelling, SOB or lightheadedness with hypotension: Yes Has patient had a PCN reaction causing severe rash involving mucus membranes or skin necrosis: No Has patient had a PCN reaction that required hospitalization unknown Has patient had a PCN reaction occurring within the last 10 years: No If all of the above answers are "NO", then may proceed with Cephalosporin use.  . Sulfa Antibiotics Nausea Only  . Sulfonamide Derivatives Nausea Only    Outpatient Encounter Medications as of 06/05/2018  Medication Sig  . acetaminophen (TYLENOL) 500 MG tablet Take 1,000 mg by mouth 2 (two) times daily as needed for headache (pain). Not to exceed 3041m daily  . aspirin EC 81 MG tablet Take 1 tablet (81 mg total) by mouth daily.  . bimatoprost (LUMIGAN) 0.01 % SOLN Place 1 drop into both eyes at bedtime.  . cetirizine (ZYRTEC) 10 MG tablet Take 10 mg by mouth daily.  .Marland Kitchendicyclomine (BENTYL) 10 MG capsule Take 1 capsule (10 mg total) by mouth 3 (three) times  daily before meals. IBS  . diltiazem (CARDIZEM CD) 360 MG 24 hr capsule Take 1 capsule (360 mg total) by mouth daily.  . Emollient (EUCERIN) lotion Apply topically as needed for dry skin.  . furosemide (LASIX) 20 MG tablet Take 1 tablet (20 mg total) by mouth daily.  . hydroxypropyl methylcellulose / hypromellose (ISOPTO TEARS / GONIOVISC) 2.5 % ophthalmic solution Place 1 drop into both eyes 3 (three) times daily.  .Marland Kitchenipratropium (ATROVENT) 0.02 % nebulizer solution Take 0.5 mg by nebulization 2 (two) times daily.  . iron polysaccharides (NIFEREX) 150 MG capsule Take 150 mg by mouth daily.  .Marland Kitchenlevalbuterol (XOPENEX) 1.25 MG/0.5ML nebulizer solution Take 1.25 mg by nebulization every 6 (six) hours as needed for wheezing or shortness of breath.  .Marland Kitchen  loperamide (IMODIUM) 2 MG capsule Take 2 mg by mouth as needed for diarrhea or loose stools.   . magnesium oxide (MAG-OX) 400 MG tablet Take 400 mg by mouth daily as needed (leg cramps).  . Menthol, Topical Analgesic, (BIOFREEZE) 4 % GEL Apply topically 2 (two) times daily. To right shoulder  . Multiple Vitamin (MULTIVITAMIN WITH MINERALS) TABS tablet Take 1 tablet by mouth daily.  Marland Kitchen oxycodone (OXY-IR) 5 MG capsule Take 5 mg by mouth every 4 (four) hours as needed.  . potassium chloride SA (K-DUR,KLOR-CON) 20 MEQ tablet Take 20 mEq by mouth daily.  . ranitidine (ZANTAC) 150 MG tablet Take 150 mg by mouth 2 (two) times daily.  Marland Kitchen zolpidem (AMBIEN) 5 MG tablet Take 1 tablet (5 mg total) by mouth at bedtime as needed for sleep.   No facility-administered encounter medications on file as of 06/05/2018.     Review of Systems  Constitutional: Positive for fatigue. Negative for activity change, appetite change, chills and fever.       Takes a nap and feels renewed  HENT: Positive for hearing loss.   Eyes: Negative for visual disturbance.  Respiratory: Positive for shortness of breath. Negative for chest tightness and wheezing.   Gastrointestinal: Negative for  abdominal pain, constipation, diarrhea and nausea.  Genitourinary: Negative for dysuria.  Musculoskeletal: Positive for gait problem. Negative for arthralgias, back pain and joint swelling.  Neurological: Positive for weakness. Negative for dizziness.  Psychiatric/Behavioral: Negative for confusion. The patient is nervous/anxious.     Immunization History  Administered Date(s) Administered  . H1N1 09/30/2008  . Influenza Split 05/30/2012, 07/01/2013  . Influenza Whole 06/19/2009, 06/20/2011  . Influenza, High Dose Seasonal PF 07/01/2013  . Influenza,inj,Quad PF,6+ Mos 06/05/2014  . Influenza-Unspecified 07/19/2017  . Pneumococcal Conjugate-13 08/20/2013  . Pneumococcal Polysaccharide-23 11/18/1992, 08/25/2010  . Tetanus 08/20/2013  . Zoster 09/19/2004  . Zoster Recombinat (Shingrix) 10/20/2017   Pertinent  Health Maintenance Due  Topic Date Due  . DEXA SCAN  03/15/1993  . INFLUENZA VACCINE  04/19/2018  . PNA vac Low Risk Adult  Completed   Fall Risk  06/06/2014  Falls in the past year? No   Functional Status Survey:    Vitals:   06/05/18 1346  BP: 136/70  Pulse: 88  Resp: 20  Temp: (!) 97.2 F (36.2 C)  TempSrc: Oral  SpO2: 99%  Weight: 130 lb (59 kg)  Height: 5' 4.5" (1.638 m)   Body mass index is 21.97 kg/m. Physical Exam  Constitutional: She is oriented to person, place, and time. She appears well-developed. No distress.  HENT:  Extremely HOH, but replaced battery in hearing aid with improvement during visit  Cardiovascular: Normal rate, regular rhythm, normal heart sounds and intact distal pulses.  Pulmonary/Chest: Effort normal and breath sounds normal. She has no rales.  Abdominal: Bowel sounds are normal.  Musculoskeletal: Normal range of motion.  Uses rollator walker to ambulate, also uses power chair for longer distances; visits husband twice a day in [place]  Neurological: She is alert and oriented to person, place, and time.  Skin: Skin is warm and dry.    Ecchymoses/senile purpura of hands and arms  Psychiatric: She has a normal mood and affect.    Labs reviewed: Recent Labs    01/16/18 0737  NA 137  K 5.2  BUN 28*  CREATININE 0.8   Recent Labs    01/16/18 0737  AST 31  ALT 9  ALKPHOS 90   Recent Labs  01/16/18 0737  WBC 2.6  HGB 9.3*  HCT 28*  PLT 1,028*   Lab Results  Component Value Date   TSH 2.712 03/02/2017   Lab Results  Component Value Date   HGBA1C (H) 10/04/2010    5.7 (NOTE)                                                                       According to the ADA Clinical Practice Recommendations for 2011, when HbA1c is used as a screening test:   >=6.5%   Diagnostic of Diabetes Mellitus           (if abnormal result  is confirmed)  5.7-6.4%   Increased risk of developing Diabetes Mellitus  References:Diagnosis and Classification of Diabetes Mellitus,Diabetes YPPJ,0932,67(TIWPY 1):S62-S69 and Standards of Medical Care in         Diabetes - 2011,Diabetes Care,2011,34  (Suppl 1):S11-S61.   Lab Results  Component Value Date   CHOL 247 (H) 06/05/2014   HDL 75.60 06/05/2014   LDLCALC 156 (H) 06/05/2014   LDLDIRECT 157.6 01/16/2012   TRIG 78.0 06/05/2014   CHOLHDL 3 06/05/2014   Assessment/Plan 1. MDS/MPN (myelodysplastic/myeloproliferative neoplasms) ([place]) -reviewed with pt purpose of prior infusion to decrease stroke/clot risk, but she's clear she does not want to sit two hours for an infusion that does not actually make her feel better -reports quality is good as it is and doesn't want to change anything -continues on hospice care  2. Other neutropenia ([place]) -ongoing, last PMN 1.4 with wbc 2.4, hgb 8/8 and plts 645  3. Chronic respiratory failure with hypoxia (HCC) -cont oxygen when needed   4. Chronic diastolic congestive heart failure (HCC) -cont lasix 71m, stable from this perspective  5. COPD mixed type ([place] -cont atrovent nebs, xopenex nebs and prn oxygen for comfort  6. Primary  open angle glaucoma of both eyes, unspecified glaucoma stage -saw ophtho, Dr. HMatthew Sarason 4/23 and on lumigan  7. Presbycusis of both ears -hearing is very poor even with hearing aids and hard to communicate with her at times, she gets frustrated; turned out today that hearing aid needed new functional battery  Family/ staff Communication: discussed with snf nurse  Labs/tests ordered:  No new  Elsye Mccollister L. Shaden Lacher, D.O. [facility] [address] [place] [id number] Phone (Mon-Fri 8am-5pm):  [phone] Call:  [phone]& follow prompts after 5pm & weekends Office Phone:  [phone] Fax:  [phone]

## 2018-06-07 DIAGNOSIS — I4891 Unspecified atrial fibrillation: Secondary | ICD-10-CM | POA: Diagnosis not present

## 2018-06-07 DIAGNOSIS — I5030 Unspecified diastolic (congestive) heart failure: Secondary | ICD-10-CM | POA: Diagnosis not present

## 2018-06-07 DIAGNOSIS — J449 Chronic obstructive pulmonary disease, unspecified: Secondary | ICD-10-CM | POA: Diagnosis not present

## 2018-06-07 DIAGNOSIS — I25810 Atherosclerosis of coronary artery bypass graft(s) without angina pectoris: Secondary | ICD-10-CM | POA: Diagnosis not present

## 2018-06-07 DIAGNOSIS — I10 Essential (primary) hypertension: Secondary | ICD-10-CM | POA: Diagnosis not present

## 2018-06-07 DIAGNOSIS — D469 Myelodysplastic syndrome, unspecified: Secondary | ICD-10-CM | POA: Diagnosis not present

## 2018-06-08 DIAGNOSIS — I5032 Chronic diastolic (congestive) heart failure: Secondary | ICD-10-CM | POA: Diagnosis not present

## 2018-06-08 DIAGNOSIS — F411 Generalized anxiety disorder: Secondary | ICD-10-CM | POA: Diagnosis not present

## 2018-06-08 DIAGNOSIS — M6389 Disorders of muscle in diseases classified elsewhere, multiple sites: Secondary | ICD-10-CM | POA: Diagnosis not present

## 2018-06-08 DIAGNOSIS — R296 Repeated falls: Secondary | ICD-10-CM | POA: Diagnosis not present

## 2018-06-08 DIAGNOSIS — R278 Other lack of coordination: Secondary | ICD-10-CM | POA: Diagnosis not present

## 2018-06-08 DIAGNOSIS — J9611 Chronic respiratory failure with hypoxia: Secondary | ICD-10-CM | POA: Diagnosis not present

## 2018-06-11 ENCOUNTER — Encounter: Payer: Self-pay | Admitting: Adult Health

## 2018-06-11 ENCOUNTER — Non-Acute Institutional Stay (SKILLED_NURSING_FACILITY): Payer: Medicare Other | Admitting: Adult Health

## 2018-06-11 DIAGNOSIS — D649 Anemia, unspecified: Secondary | ICD-10-CM | POA: Diagnosis not present

## 2018-06-11 DIAGNOSIS — J9611 Chronic respiratory failure with hypoxia: Secondary | ICD-10-CM | POA: Diagnosis not present

## 2018-06-11 DIAGNOSIS — R55 Syncope and collapse: Secondary | ICD-10-CM | POA: Diagnosis not present

## 2018-06-11 DIAGNOSIS — I482 Chronic atrial fibrillation, unspecified: Secondary | ICD-10-CM

## 2018-06-11 DIAGNOSIS — I5032 Chronic diastolic (congestive) heart failure: Secondary | ICD-10-CM | POA: Diagnosis not present

## 2018-06-11 DIAGNOSIS — I10 Essential (primary) hypertension: Secondary | ICD-10-CM | POA: Diagnosis not present

## 2018-06-11 DIAGNOSIS — I5030 Unspecified diastolic (congestive) heart failure: Secondary | ICD-10-CM | POA: Diagnosis not present

## 2018-06-11 DIAGNOSIS — J449 Chronic obstructive pulmonary disease, unspecified: Secondary | ICD-10-CM | POA: Diagnosis not present

## 2018-06-11 DIAGNOSIS — I25810 Atherosclerosis of coronary artery bypass graft(s) without angina pectoris: Secondary | ICD-10-CM | POA: Diagnosis not present

## 2018-06-11 DIAGNOSIS — Z79899 Other long term (current) drug therapy: Secondary | ICD-10-CM | POA: Diagnosis not present

## 2018-06-11 DIAGNOSIS — R296 Repeated falls: Secondary | ICD-10-CM | POA: Diagnosis not present

## 2018-06-11 DIAGNOSIS — M6389 Disorders of muscle in diseases classified elsewhere, multiple sites: Secondary | ICD-10-CM | POA: Diagnosis not present

## 2018-06-11 DIAGNOSIS — D469 Myelodysplastic syndrome, unspecified: Secondary | ICD-10-CM | POA: Diagnosis not present

## 2018-06-11 DIAGNOSIS — F411 Generalized anxiety disorder: Secondary | ICD-10-CM | POA: Diagnosis not present

## 2018-06-11 DIAGNOSIS — I4891 Unspecified atrial fibrillation: Secondary | ICD-10-CM | POA: Diagnosis not present

## 2018-06-11 DIAGNOSIS — R278 Other lack of coordination: Secondary | ICD-10-CM | POA: Diagnosis not present

## 2018-06-11 LAB — CBC AND DIFFERENTIAL
HCT: 27 — AB (ref 36–46)
Hemoglobin: 8.8 — AB (ref 12.0–16.0)
NEUTROS ABS: 1
PLATELETS: 645 — AB (ref 150–399)
WBC: 2.4

## 2018-06-11 NOTE — Progress Notes (Signed)
Location:  Occupational psychologist of Service:  SNF (31) Provider:   Cindi Carbon, ANP [place] [phone]  Gayland Curry, DO  Patient Care Team: Gayland Curry, DO as PCP - General (Geriatric Medicine) Brand Males, MD as Consulting Physician (Pulmonary Disease) [name], MD as Consulting Physician (Orthopedic Surgery)  No emergency contact information on file.  Code Status:  DNR Goals of care: Advanced Directive information Advanced Directives 06/05/2018  Does Patient Have a Medical Advance Directive? Yes  Type of Paramedic of [place];Living will;Out of facility DNR (pink MOST or yellow form)  Does patient want to make changes to medical advance directive? No - Patient declined  Copy of [place] in Chart? Yes  Would patient like information on creating a medical advance directive? -  Pre-existing out of facility DNR order (yellow form or pink MOST form) Yellow form placed in chart (order not valid for inpatient use);Pink MOST form placed in chart (order not valid for inpatient use)     Chief Complaint  Patient presents with  . Acute Visit    syncope    HPI:  Lori [name] is a 84 y.o. female seen today for syncope. This is a recurrent problem over the course of a few years by the resident's account and in review of the epic chart. She has not had any episodes in the past month per [facility] records.  She is followed by hospice but despite this is doing quite well most days. She recently switched to [facility] from Dr. Philip Aspen. She has a hx of CHF, MPD, afib, COPD, asthma, oxygen dependent, memory loss, anxiety, back pain, esophageal stricture, among others. She reports she was at lunch standing up in the buffet line in the [facility] dining room and passed out. Her Bp was 67/50 but went up to 115/58 after lying down. HR stayed in the 70-80 range. Her BP typically runs on the low normal side and her pulse runs  in the 80s. See orthostatics below.  She is on 40mg  of lasix daily due to edema and a hx of diastolic CHF. She currently denies any issues with cough or sob. She does have chronic DOE but can go all the way down the hall at a very fast pace with minimal sob as long as she is wearing oxygen. The nurse reports that the lasix was increased from 20 to 40 due to edema but I don't have any recent records to review about his issue at this time. Echo EF 60-65% with no wall motion abnormalities. Not abe to test for diastolic dysfunction due to afib. Noted moderately increased PA pressure of 49 mmHg.    Sitting 116/71 92, standing 95 1130/60, lying 86 127/62  She states she is very concerned about passing out because it is embarrassing and she would like it to stop if possible She states "I am ready to die, don't worry about that, I just want to stop passing out".    Past Medical History:  Diagnosis Date  . ALLERGIC RHINITIS   . Anxiety   . Asthma   . Atrial fibrillation ([place])   . Back pain of thoracolumbar region    right  . Bronchiectasis   . Colles' fracture of left radius   . COPD (chronic obstructive pulmonary disease) ([place])   . Coronary artery disease   . Diastolic dysfunction   . Diverticulosis   . Dysrhythmia   . Esophageal stricture   .  Hiatal hernia   . History of pneumonia   . Hx of cardiovascular stress test    a. ETT-MV 1/14: Exercised 4:16, no ECG changes, poor ex tol, ant defect c/w soft tissue atten, no ischemia, EF 75%  . Hx of colonic polyp   . Hyperlipidemia   . Hypertension   . IBS (irritable bowel syndrome)   . Insomnia   . Multiple rib fractures 10/2008   bilateral  . Pleural effusion, left   . Pruritus   . Pulmonary nodule   . Syncope    Past Surgical History:  Procedure Laterality Date  . ANKLE CLOSED REDUCTION Right 12/11/2014   Procedure: CLOSED REDUCTION ANKLE;  Surgeon: Rod Can, MD;  Location: [place];  Service: Orthopedics;  Laterality: Right;  .  APPENDECTOMY    . BLADDER SUSPENSION  2005   A-P  . CHOLECYSTECTOMY    . COLONOSCOPY  04/06/2012   Procedure: COLONOSCOPY;  Surgeon: Jerene Bears, MD;  [facility];  Service: Gastroenterology;  Laterality: N/A;  . EXTERNAL FIXATION LEG Right 12/12/2014   Procedure: ADJUSTMENT OF EXTERNAL FIXATION  RIGHT ANKLE;  Surgeon: Rod Can, MD;  Location: [place];  Service: Orthopedics;  Laterality: Right;  . EXTERNAL FIXATION LEG Right 12/11/2014   Procedure: POSSIBLE EXTERNAL FIXATION RIGHT ANKLE;  Surgeon: Rod Can, MD;  Location: [place];  Service: Orthopedics;  Laterality: Right;  . HARDWARE REMOVAL Right 12/25/2014   Procedure: RIGHT ANKLE REMOVE OF DEEP IMPLANT ANE EXTERNAL FIXATOR ;  Surgeon: [name], MD;  Location: [place];  Service: Orthopedics;  Laterality: Right;  . ORIF ANKLE FRACTURE Right 12/25/2014   Procedure: OPEN REDUCTION INTERNAL FIXATION (ORIF)TRIMALLEOLAR  ANKLE FRACTURE;  Surgeon: [name], MD;  Location: [place];  Service: Orthopedics;  Laterality: Right;  . ORIF WRIST FRACTURE Left 2010     Dr. Burney Gauze.  Marland Kitchen POLYPECTOMY    . ROTATOR CUFF REPAIR Right 2006  . TONSILLECTOMY    . TOTAL KNEE ARTHROPLASTY Right 08/2010    Allergies  Allergen Reactions  . Albuterol Sulfate Palpitations  . Levaquin [Levofloxacin In D5w] Other (See Comments)    QT prolongation. Should avoid all flouroquinolones.   . Lactose Intolerance (Gi) Other (See Comments)  . Amoxicillin Other (See Comments)    REACTION: unspecified  . Codeine Other (See Comments)    Can take Hydrocodone  . Penicillins Hives, Itching and Rash    Hard lump and rash Has patient had a PCN reaction causing immediate rash, facial/tongue/throat swelling, SOB or lightheadedness with hypotension: Yes Has patient had a PCN reaction causing severe rash involving mucus membranes or skin necrosis: No Has patient had a PCN reaction that required hospitalization unknown Has  patient had a PCN reaction occurring within the last 10 years: No If all of the above answers are "NO", then may proceed with Cephalosporin use.  . Sulfa Antibiotics Nausea Only  . Sulfonamide Derivatives Nausea Only    Outpatient Encounter Medications as of 06/11/2018  Medication Sig  . acetaminophen (TYLENOL) 500 MG tablet Take 1,000 mg by mouth 2 (two) times daily as needed for headache (pain). Not to exceed 3000mg  daily  . aspirin EC 81 MG tablet Take 1 tablet (81 mg total) by mouth daily.  . bimatoprost (LUMIGAN) 0.01 % SOLN Place 1 drop into both eyes at bedtime.  . cetirizine (ZYRTEC) 10 MG tablet Take 10 mg by mouth daily.  Marland Kitchen dicyclomine (BENTYL) 10 MG capsule Take 1 capsule (10 mg total)  by mouth 3 (three) times daily before meals. IBS  . diltiazem (CARDIZEM CD) 360 MG 24 hr capsule Take 1 capsule (360 mg total) by mouth daily.  . Emollient (EUCERIN) lotion Apply topically as needed for dry skin.  . furosemide (LASIX) 40 MG tablet Take 40 mg by mouth daily.  Marland Kitchen ipratropium (ATROVENT) 0.02 % nebulizer solution Take 0.5 mg by nebulization 2 (two) times daily.  . iron polysaccharides (NIFEREX) 150 MG capsule Take 150 mg by mouth daily.  Marland Kitchen levalbuterol (XOPENEX) 1.25 MG/0.5ML nebulizer solution Take 1.25 mg by nebulization every 6 (six) hours as needed for wheezing or shortness of breath.  . loperamide (IMODIUM) 2 MG capsule Take 2 mg by mouth as needed for diarrhea or loose stools.   . magnesium oxide (MAG-OX) 400 MG tablet Take 400 mg by mouth daily as needed (leg cramps).  . Menthol, Topical Analgesic, (BIOFREEZE) 4 % GEL Apply topically 2 (two) times daily. To right shoulder  . Multiple Vitamin (MULTIVITAMIN WITH MINERALS) TABS tablet Take 1 tablet by mouth daily.  Marland Kitchen oxycodone (OXY-IR) 5 MG capsule Take 5 mg by mouth every 4 (four) hours as needed.  . potassium chloride SA (K-DUR,KLOR-CON) 20 MEQ tablet Take 20 mEq by mouth daily.  . ranitidine (ZANTAC) 150 MG tablet Take 150 mg by  mouth 2 (two) times daily.  Marland Kitchen zolpidem (AMBIEN) 5 MG tablet Take 1 tablet (5 mg total) by mouth at bedtime as needed for sleep. (Patient taking differently: Take 5 mg by mouth at bedtime. )  . [DISCONTINUED] furosemide (LASIX) 20 MG tablet Take 1 tablet (20 mg total) by mouth daily.  . [DISCONTINUED] hydroxypropyl methylcellulose / hypromellose (ISOPTO TEARS / GONIOVISC) 2.5 % ophthalmic solution Place 1 drop into both eyes 3 (three) times daily.   No facility-administered encounter medications on file as of 06/11/2018.     Review of Systems  Constitutional: Negative for activity change, appetite change, chills, diaphoresis, fatigue, fever and unexpected weight change.  HENT: Negative for congestion.   Respiratory: Positive for cough (occasional ) and shortness of breath. Negative for wheezing.   Cardiovascular: Negative for chest pain, palpitations and leg swelling.  Gastrointestinal: Negative for abdominal distention, abdominal pain, constipation and diarrhea.  Genitourinary: Negative for difficulty urinating and dysuria.  Musculoskeletal: Positive for gait problem. Negative for arthralgias, back pain, joint swelling and myalgias.  Neurological: Positive for syncope. Negative for dizziness, tremors, seizures, facial asymmetry, speech difficulty, weakness, light-headedness, numbness and headaches.  Psychiatric/Behavioral: Negative for agitation, behavioral problems and confusion.       Memory loss    Immunization History  Administered Date(s) Administered  . H1N1 09/30/2008  . Influenza Split 05/30/2012, 07/01/2013  . Influenza Whole 06/19/2009, 06/20/2011  . Influenza, High Dose Seasonal PF 07/01/2013  . Influenza,inj,Quad PF,6+ Mos 06/05/2014  . Influenza-Unspecified 07/19/2017  . Pneumococcal Conjugate-13 08/20/2013  . Pneumococcal Polysaccharide-23 11/18/1992, 08/25/2010  . Tetanus 08/20/2013  . Zoster 09/19/2004  . Zoster Recombinat (Shingrix) 10/20/2017   Pertinent  Health  Maintenance Due  Topic Date Due  . DEXA SCAN  03/15/1993  . INFLUENZA VACCINE  04/19/2018  . PNA vac Low Risk Adult  Completed   Fall Risk  06/06/2014  Falls in the past year? No   Functional Status Survey:    Vitals:   06/11/18 1207  BP: 127/62  Pulse: 88  Resp: 20  Temp: (!) 97 F (36.1 C)  SpO2: 95%  Weight: 130 lb (59 kg)   Body mass index is 21.97 kg/m. Physical  Exam  Constitutional: She is oriented to person, place, and time. No distress.  HENT:  Head: Normocephalic and atraumatic.  Neck: No JVD present.  Cardiovascular: Normal rate and regular rhythm.  No murmur heard. Pulmonary/Chest: Effort normal and breath sounds normal. No respiratory distress. She has no wheezes.  Abdominal: Soft. Bowel sounds are normal. She exhibits no distension.  Neurological: She is alert and oriented to person, place, and time.  Skin: Skin is warm and dry. She is not diaphoretic.  Psychiatric: She has a normal mood and affect.  Nursing note and vitals reviewed.   Labs reviewed: Recent Labs    01/16/18 0737  NA 137  K 5.2  BUN 28*  CREATININE 0.8   Recent Labs    01/16/18 0737  AST 31  ALT 9  ALKPHOS 90   Recent Labs    01/16/18 0737  WBC 2.6  HGB 9.3*  HCT 28*  PLT 1,028*   Lab Results  Component Value Date   TSH 2.712 03/02/2017   Lab Results  Component Value Date   HGBA1C (H) 10/04/2010    5.7 (NOTE)                                                                       According to the ADA Clinical Practice Recommendations for 2011, when HbA1c is used as a screening test:   >=6.5%   Diagnostic of Diabetes Mellitus           (if abnormal result  is confirmed)  5.7-6.4%   Increased risk of developing Diabetes Mellitus  References:Diagnosis and Classification of Diabetes Mellitus,Diabetes [id number],3382,50(NLZJQ 1):S62-S69 and Standards of Medical Care in         Diabetes - 2011,Diabetes Care,2011,34  (Suppl 1):S11-S61.   Lab Results  Component Value Date   CHOL  247 (H) 06/05/2014   HDL 75.60 06/05/2014   LDLCALC 156 (H) 06/05/2014   LDLDIRECT 157.6 01/16/2012   TRIG 78.0 06/05/2014   CHOLHDL 3 06/05/2014    Significant Diagnostic Results in last 30 days:  No results found.  Assessment/Plan   1. Syncope Decrease Cardizem to 240 mg p.o.qd due to syncope with 67/50 HR regular for today's visit at 60. Monitor pulse qd x 1 week and report Stay hydrated Recommended compression hose but she refused If she does have low bp her she should be placed in bed with her head down and her feet up Avoid standing for prolonged periods, rise slowly, and avoid ambulating too fast Check BMP and CBC  Continue current dose of lasix but would consider dose reduction  Resident is followed by hospice and wishes to avoid aggressive care but would like treatment for reversible conditions.   2. Afib Rate remains controlled and regular See above Not on anticoagulation due to falls and goals of care.   Family/ staff Communication: discussed with Lori [name] and her husband Lori [name]  Labs/tests ordered:  CBC BMP

## 2018-06-12 DIAGNOSIS — J9611 Chronic respiratory failure with hypoxia: Secondary | ICD-10-CM | POA: Diagnosis not present

## 2018-06-12 DIAGNOSIS — H401130 Primary open-angle glaucoma, bilateral, stage unspecified: Secondary | ICD-10-CM | POA: Insufficient documentation

## 2018-06-12 DIAGNOSIS — R296 Repeated falls: Secondary | ICD-10-CM | POA: Diagnosis not present

## 2018-06-12 DIAGNOSIS — M6389 Disorders of muscle in diseases classified elsewhere, multiple sites: Secondary | ICD-10-CM | POA: Diagnosis not present

## 2018-06-12 DIAGNOSIS — I5032 Chronic diastolic (congestive) heart failure: Secondary | ICD-10-CM | POA: Diagnosis not present

## 2018-06-12 DIAGNOSIS — R278 Other lack of coordination: Secondary | ICD-10-CM | POA: Diagnosis not present

## 2018-06-12 DIAGNOSIS — D708 Other neutropenia: Secondary | ICD-10-CM | POA: Insufficient documentation

## 2018-06-12 DIAGNOSIS — F411 Generalized anxiety disorder: Secondary | ICD-10-CM | POA: Diagnosis not present

## 2018-06-15 ENCOUNTER — Non-Acute Institutional Stay (SKILLED_NURSING_FACILITY): Payer: Medicare Other | Admitting: Adult Health

## 2018-06-15 ENCOUNTER — Encounter: Payer: Self-pay | Admitting: Adult Health

## 2018-06-15 DIAGNOSIS — I482 Chronic atrial fibrillation, unspecified: Secondary | ICD-10-CM

## 2018-06-15 DIAGNOSIS — I10 Essential (primary) hypertension: Secondary | ICD-10-CM | POA: Diagnosis not present

## 2018-06-15 DIAGNOSIS — D469 Myelodysplastic syndrome, unspecified: Secondary | ICD-10-CM | POA: Diagnosis not present

## 2018-06-15 DIAGNOSIS — I5030 Unspecified diastolic (congestive) heart failure: Secondary | ICD-10-CM | POA: Diagnosis not present

## 2018-06-15 DIAGNOSIS — H60391 Other infective otitis externa, right ear: Secondary | ICD-10-CM

## 2018-06-15 DIAGNOSIS — I4891 Unspecified atrial fibrillation: Secondary | ICD-10-CM | POA: Diagnosis not present

## 2018-06-15 DIAGNOSIS — I25810 Atherosclerosis of coronary artery bypass graft(s) without angina pectoris: Secondary | ICD-10-CM | POA: Diagnosis not present

## 2018-06-15 DIAGNOSIS — J449 Chronic obstructive pulmonary disease, unspecified: Secondary | ICD-10-CM | POA: Diagnosis not present

## 2018-06-15 NOTE — Progress Notes (Signed)
Location:  Occupational psychologist of Service:  SNF (31) Provider:   Cindi Carbon, ANP [place] [phone]   Gayland Curry, DO  Patient Care Team: Gayland Curry, DO as PCP - General (Geriatric Medicine) Brand Males, MD as Consulting Physician (Pulmonary Disease) [name], MD as Consulting Physician (Orthopedic Surgery) Ralene Bathe, MD as Consulting Physician (Ophthalmology) Pyrtle, Lajuan Lines, MD as Consulting Physician (Gastroenterology) Royal Hawthorn, NP as Nurse Practitioner (Nurse Practitioner) Court Joy, MD as Consulting Physician (Internal Medicine)  No emergency contact information on file.  Code Status:  DNR Goals of care: Advanced Directive information Advanced Directives 06/05/2018  Does Patient Have a Medical Advance Directive? Yes  Type of Paramedic of [place];Living will;Out of facility DNR (pink MOST or yellow form)  Does patient want to make changes to medical advance directive? No - Patient declined  Copy of [place] in Chart? Yes  Would patient like information on creating a medical advance directive? -  Pre-existing out of facility DNR order (yellow form or pink MOST form) Yellow form placed in chart (order not valid for inpatient use);Pink MOST form placed in chart (order not valid for inpatient use)     Chief Complaint  Patient presents with  . Acute Visit    rigfht ear pain    HPI:  Pt is a 84 y.o. female seen today for an acute visit for right ear pain present for 1 week. There is no associated fever, drainage, cough, sore throat, or sob. She has a hx of severe hearing loss and wears hearing aides in both ears.  She has a hx of afib and Cardizem was reduced earlier this week to 240 mg due to a syncopal episode with low bp but since the reduction she has felt weaker and her HR has ranged 93-100.  Past Medical History:  Diagnosis Date  .  ALLERGIC RHINITIS   . Anxiety   . Asthma   . Atrial fibrillation ([place])   . Back pain of thoracolumbar region    right  . Bronchiectasis   . Colles' fracture of left radius   . COPD (chronic obstructive pulmonary disease) ([place])   . Coronary artery disease   . Diastolic dysfunction   . Diverticulosis   . Dysrhythmia   . Esophageal stricture   . Hiatal hernia   . History of pneumonia   . Hx of cardiovascular stress test    a. ETT-MV 1/14: Exercised 4:16, no ECG changes, poor ex tol, ant defect c/w soft tissue atten, no ischemia, EF 75%  . Hx of colonic polyp   . Hyperlipidemia   . Hypertension   . IBS (irritable bowel syndrome)   . Insomnia   . Multiple rib fractures 10/2008   bilateral  . Pleural effusion, left   . Pruritus   . Pulmonary nodule   . Syncope    Past Surgical History:  Procedure Laterality Date  . ANKLE CLOSED REDUCTION Right 12/11/2014   Procedure: CLOSED REDUCTION ANKLE;  Surgeon: Rod Can, MD;  Location: [place];  Service: Orthopedics;  Laterality: Right;  . APPENDECTOMY    . BLADDER SUSPENSION  2005   A-P  . CHOLECYSTECTOMY    . COLONOSCOPY  04/06/2012   Procedure: COLONOSCOPY;  Surgeon: Jerene Bears, MD;  [facility];  Service: Gastroenterology;  Laterality: N/A;  . EXTERNAL FIXATION LEG Right 12/12/2014   Procedure: ADJUSTMENT OF EXTERNAL FIXATION  RIGHT ANKLE;  Surgeon:  Rod Can, MD;  Location: [place];  Service: Orthopedics;  Laterality: Right;  . EXTERNAL FIXATION LEG Right 12/11/2014   Procedure: POSSIBLE EXTERNAL FIXATION RIGHT ANKLE;  Surgeon: Rod Can, MD;  Location: [place];  Service: Orthopedics;  Laterality: Right;  . HARDWARE REMOVAL Right 12/25/2014   Procedure: RIGHT ANKLE REMOVE OF DEEP IMPLANT ANE EXTERNAL FIXATOR ;  Surgeon: [name], MD;  Location: [place];  Service: Orthopedics;  Laterality: Right;  . ORIF ANKLE FRACTURE Right 12/25/2014   Procedure: OPEN REDUCTION INTERNAL FIXATION  (ORIF)TRIMALLEOLAR  ANKLE FRACTURE;  Surgeon: [name], MD;  Location: [place];  Service: Orthopedics;  Laterality: Right;  . ORIF WRIST FRACTURE Left 2010     Dr. Burney Gauze.  Marland Kitchen POLYPECTOMY    . ROTATOR CUFF REPAIR Right 2006  . TONSILLECTOMY    . TOTAL KNEE ARTHROPLASTY Right 08/2010    Allergies  Allergen Reactions  . Albuterol Sulfate Palpitations  . Levaquin [Levofloxacin In D5w] Other (See Comments)    QT prolongation. Should avoid all flouroquinolones.   . Lactose Intolerance (Gi) Other (See Comments)  . Amoxicillin Other (See Comments)    REACTION: unspecified  . Codeine Other (See Comments)    Can take Hydrocodone  . Penicillins Hives, Itching and Rash    Hard lump and rash Has patient had a PCN reaction causing immediate rash, facial/tongue/throat swelling, SOB or lightheadedness with hypotension: Yes Has patient had a PCN reaction causing severe rash involving mucus membranes or skin necrosis: No Has patient had a PCN reaction that required hospitalization unknown Has patient had a PCN reaction occurring within the last 10 years: No If all of the above answers are "NO", then may proceed with Cephalosporin use.  . Sulfa Antibiotics Nausea Only  . Sulfonamide Derivatives Nausea Only    Outpatient Encounter Medications as of 06/15/2018  Medication Sig  . acetaminophen (TYLENOL) 500 MG tablet Take 1,000 mg by mouth 2 (two) times daily as needed for headache (pain). Not to exceed 3000mg  daily  . aspirin EC 81 MG tablet Take 1 tablet (81 mg total) by mouth daily.  . bimatoprost (LUMIGAN) 0.01 % SOLN Place 1 drop into both eyes at bedtime.  . cetirizine (ZYRTEC) 10 MG tablet Take 10 mg by mouth daily.  Marland Kitchen dicyclomine (BENTYL) 10 MG capsule Take 1 capsule (10 mg total) by mouth 3 (three) times daily before meals. IBS  . Emollient (EUCERIN) lotion Apply topically as needed for dry skin.  . furosemide (LASIX) 40 MG tablet Take 40 mg by mouth daily.  Marland Kitchen  ipratropium (ATROVENT) 0.02 % nebulizer solution Take 0.5 mg by nebulization 2 (two) times daily.  . iron polysaccharides (NIFEREX) 150 MG capsule Take 150 mg by mouth daily.  Marland Kitchen levalbuterol (XOPENEX) 1.25 MG/0.5ML nebulizer solution Take 1.25 mg by nebulization every 6 (six) hours as needed for wheezing or shortness of breath.  . loperamide (IMODIUM) 2 MG capsule Take 2 mg by mouth as needed for diarrhea or loose stools.   . magnesium oxide (MAG-OX) 400 MG tablet Take 400 mg by mouth daily as needed (leg cramps).  . Menthol, Topical Analgesic, (BIOFREEZE) 4 % GEL Apply topically 2 (two) times daily. To right shoulder  . Multiple Vitamin (MULTIVITAMIN WITH MINERALS) TABS tablet Take 1 tablet by mouth daily.  Marland Kitchen oxycodone (OXY-IR) 5 MG capsule Take 5 mg by mouth every 4 (four) hours as needed.  . potassium chloride SA (K-DUR,KLOR-CON) 20 MEQ tablet Take 20 mEq by mouth daily.  Marland Kitchen  ranitidine (ZANTAC) 150 MG tablet Take 150 mg by mouth 2 (two) times daily.  Marland Kitchen zolpidem (AMBIEN) 5 MG tablet Take 1 tablet (5 mg total) by mouth at bedtime as needed for sleep. (Patient taking differently: Take 5 mg by mouth at bedtime. )  . [DISCONTINUED] diltiazem (CARDIZEM CD) 360 MG 24 hr capsule Take 1 capsule (360 mg total) by mouth daily.   No facility-administered encounter medications on file as of 06/15/2018.     Review of Systems  Constitutional: Positive for fatigue. Negative for activity change, appetite change, chills, diaphoresis, fever and unexpected weight change.  HENT: Positive for ear pain and hearing loss. Negative for congestion, ear discharge, facial swelling, mouth sores, nosebleeds, postnasal drip, rhinorrhea, sinus pressure, sinus pain and trouble swallowing.   Respiratory: Positive for shortness of breath (chronic with exertion). Negative for cough and wheezing.   Cardiovascular: Negative for chest pain, palpitations and leg swelling.  Gastrointestinal: Negative for abdominal distention, abdominal  pain, constipation and diarrhea.  Genitourinary: Negative for difficulty urinating and dysuria.  Musculoskeletal: Positive for gait problem (uses walker). Negative for arthralgias, back pain, joint swelling and myalgias.  Neurological: Positive for syncope (hx of). Negative for dizziness, tremors, seizures, facial asymmetry, speech difficulty, weakness, light-headedness, numbness and headaches.  Psychiatric/Behavioral: Negative for agitation, behavioral problems and confusion.       Memory loss    Immunization History  Administered Date(s) Administered  . H1N1 09/30/2008  . Influenza Split 05/30/2012, 07/01/2013  . Influenza Whole 06/19/2009, 06/20/2011  . Influenza, High Dose Seasonal PF 07/01/2013  . Influenza,inj,Quad PF,6+ Mos 06/05/2014  . Influenza-Unspecified 07/19/2017  . Pneumococcal Conjugate-13 08/20/2013  . Pneumococcal Polysaccharide-23 11/18/1992, 08/25/2010  . Tetanus 08/20/2013  . Zoster 09/19/2004  . Zoster Recombinat (Shingrix) 10/20/2017   Pertinent  Health Maintenance Due  Topic Date Due  . DEXA SCAN  03/15/1993  . INFLUENZA VACCINE  04/19/2018  . PNA vac Low Risk Adult  Completed   Fall Risk  06/06/2014  Falls in the past year? No   Functional Status Survey:    Vitals:   06/15/18 1049  BP: (!) 120/52  Pulse: 93  Resp: 20  Temp: (!) 97.4 F (36.3 C)  SpO2: 99%   There is no height or weight on file to calculate BMI. Physical Exam  Constitutional: She is oriented to person, place, and time. No distress.  HENT:  Head: Normocephalic and atraumatic.  Right Ear: No lacerations. There is drainage, swelling and tenderness. No foreign bodies. Tympanic membrane is not perforated. No middle ear effusion. Decreased hearing is noted.  Left Ear: External ear normal. Decreased hearing is noted.  Nose: Nose normal.  Mouth/Throat: Oropharynx is clear and moist. No oropharyngeal exudate.  Neck: No JVD present.  Cardiovascular: Normal rate.  No murmur  heard. Irregular no edema  Pulmonary/Chest: Effort normal and breath sounds normal. No respiratory distress. She has no wheezes.  Neurological: She is alert and oriented to person, place, and time.  Skin: Skin is warm and dry. She is not diaphoretic.  Psychiatric: She has a normal mood and affect.    Labs reviewed: Recent Labs    01/16/18 0737  NA 137  K 5.2  BUN 28*  CREATININE 0.8   Recent Labs    01/16/18 0737  AST 31  ALT 9  ALKPHOS 90   Recent Labs    01/16/18 0737  WBC 2.6  HGB 9.3*  HCT 28*  PLT 1,028*   Lab Results  Component Value Date  TSH 2.712 03/02/2017   Lab Results  Component Value Date   HGBA1C (H) 10/04/2010    5.7 (NOTE)                                                                       According to the ADA Clinical Practice Recommendations for 2011, when HbA1c is used as a screening test:   >=6.5%   Diagnostic of Diabetes Mellitus           (if abnormal result  is confirmed)  5.7-6.4%   Increased risk of developing Diabetes Mellitus  References:Diagnosis and Classification of Diabetes Mellitus,Diabetes ZGYF,7494,49(QPRFF 1):S62-S69 and Standards of Medical Care in         Diabetes - 2011,Diabetes Care,2011,34  (Suppl 1):S11-S61.   Lab Results  Component Value Date   CHOL 247 (H) 06/05/2014   HDL 75.60 06/05/2014   LDLCALC 156 (H) 06/05/2014   LDLDIRECT 157.6 01/16/2012   TRIG 78.0 06/05/2014   CHOLHDL 3 06/05/2014    Significant Diagnostic Results in last 30 days:  No results found.  Assessment/Plan 1. Other infective acute otitis externa of right ear Corticosporin 4 gtts to right ear TID x 7 days Apply and place cotton ball and then keep her on her left side x 15 min Nurses to keep hearing aides clean  2. Afib Cardizem increased back to 360 mg due to feelings of weakness and HR range 93-100    Family/ staff Communication: nurse, residnet  Labs/tests ordered: NA

## 2018-06-19 DIAGNOSIS — I5030 Unspecified diastolic (congestive) heart failure: Secondary | ICD-10-CM | POA: Diagnosis not present

## 2018-06-19 DIAGNOSIS — K219 Gastro-esophageal reflux disease without esophagitis: Secondary | ICD-10-CM | POA: Diagnosis not present

## 2018-06-19 DIAGNOSIS — I4891 Unspecified atrial fibrillation: Secondary | ICD-10-CM | POA: Diagnosis not present

## 2018-06-19 DIAGNOSIS — J918 Pleural effusion in other conditions classified elsewhere: Secondary | ICD-10-CM | POA: Diagnosis not present

## 2018-06-19 DIAGNOSIS — I10 Essential (primary) hypertension: Secondary | ICD-10-CM | POA: Diagnosis not present

## 2018-06-19 DIAGNOSIS — D630 Anemia in neoplastic disease: Secondary | ICD-10-CM | POA: Diagnosis not present

## 2018-06-19 DIAGNOSIS — K831 Obstruction of bile duct: Secondary | ICD-10-CM | POA: Diagnosis not present

## 2018-06-19 DIAGNOSIS — I25810 Atherosclerosis of coronary artery bypass graft(s) without angina pectoris: Secondary | ICD-10-CM | POA: Diagnosis not present

## 2018-06-19 DIAGNOSIS — K589 Irritable bowel syndrome without diarrhea: Secondary | ICD-10-CM | POA: Diagnosis not present

## 2018-06-19 DIAGNOSIS — J449 Chronic obstructive pulmonary disease, unspecified: Secondary | ICD-10-CM | POA: Diagnosis not present

## 2018-06-19 DIAGNOSIS — D72818 Other decreased white blood cell count: Secondary | ICD-10-CM | POA: Diagnosis not present

## 2018-06-19 DIAGNOSIS — R54 Age-related physical debility: Secondary | ICD-10-CM | POA: Diagnosis not present

## 2018-06-19 DIAGNOSIS — R0902 Hypoxemia: Secondary | ICD-10-CM | POA: Diagnosis not present

## 2018-06-19 DIAGNOSIS — D469 Myelodysplastic syndrome, unspecified: Secondary | ICD-10-CM | POA: Diagnosis not present

## 2018-06-19 DIAGNOSIS — H11149 Conjunctival xerosis, unspecified, unspecified eye: Secondary | ICD-10-CM | POA: Diagnosis not present

## 2018-06-19 DIAGNOSIS — D473 Essential (hemorrhagic) thrombocythemia: Secondary | ICD-10-CM | POA: Diagnosis not present

## 2018-06-19 IMAGING — CR DG CHEST 2V
2 series · 2 of 2 positions shown · non-contrast
Comparison: May 29, 2015

CLINICAL DATA: Witnessed loss of consciousness.

EXAM:
CHEST  2 VIEW

[chest lat]
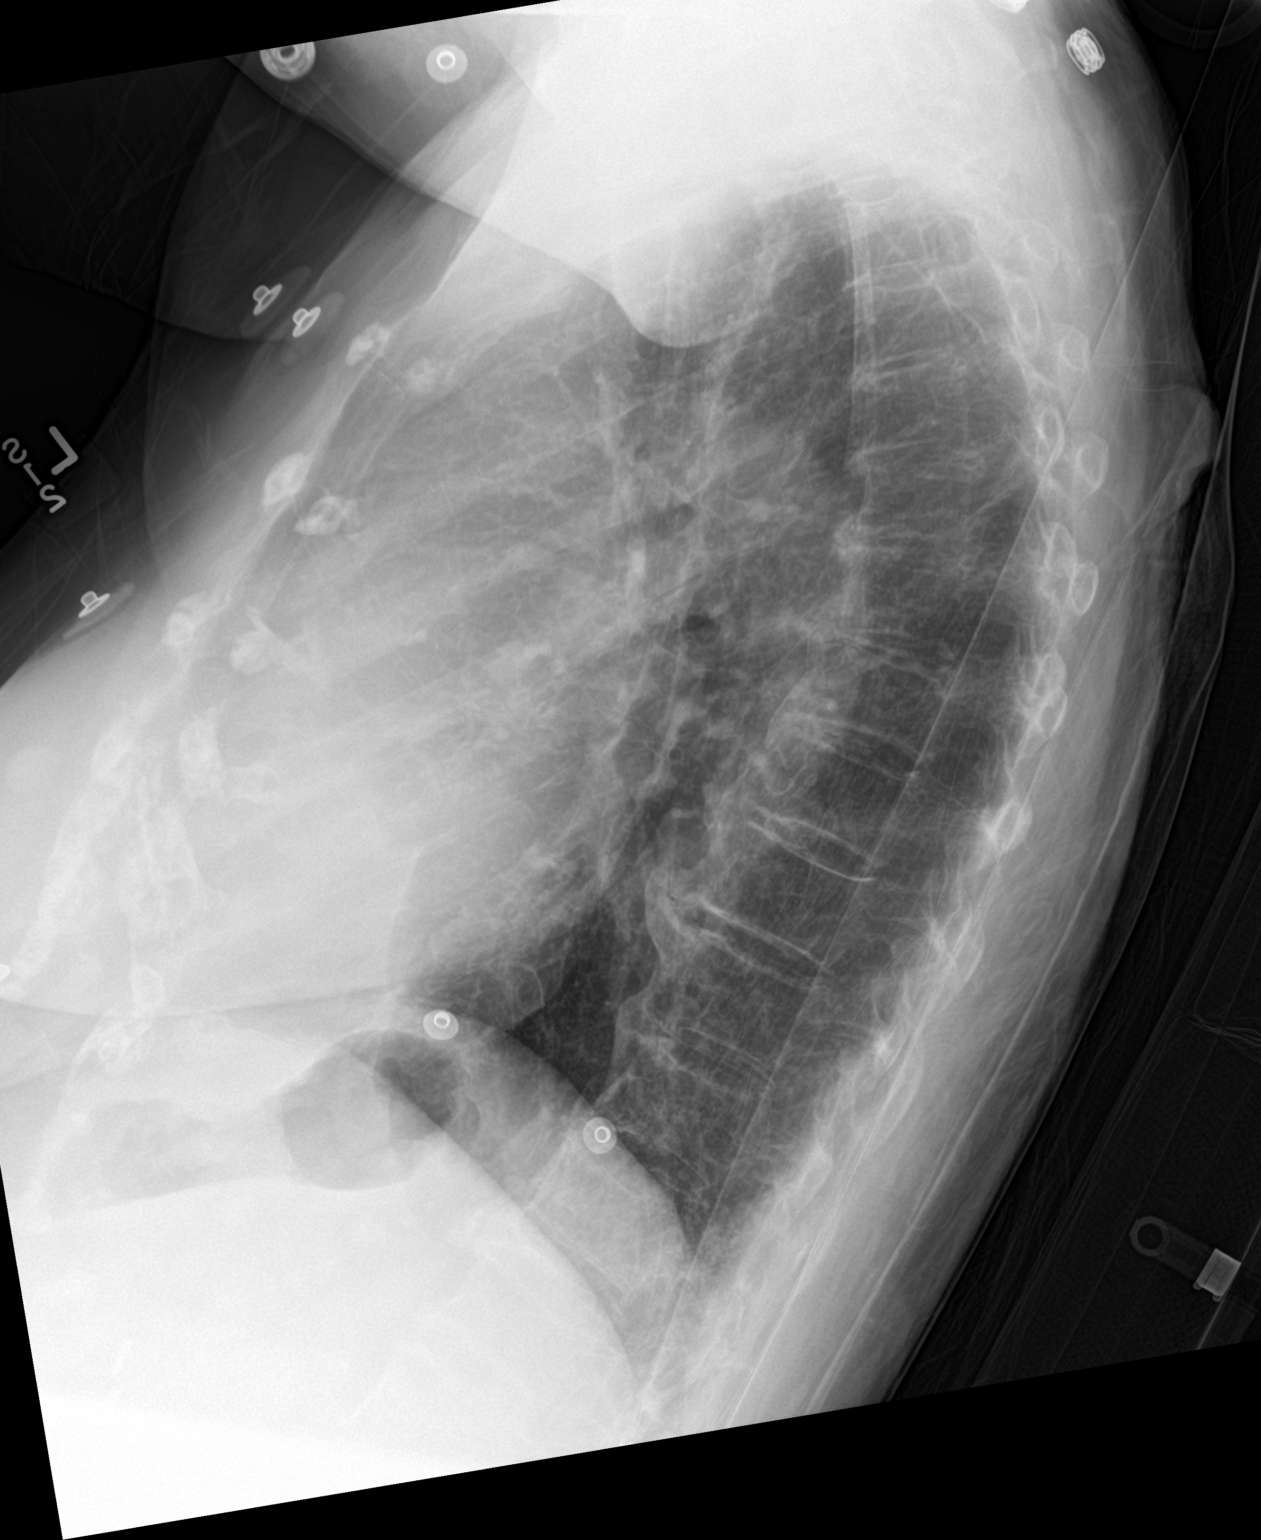

[chest ap]
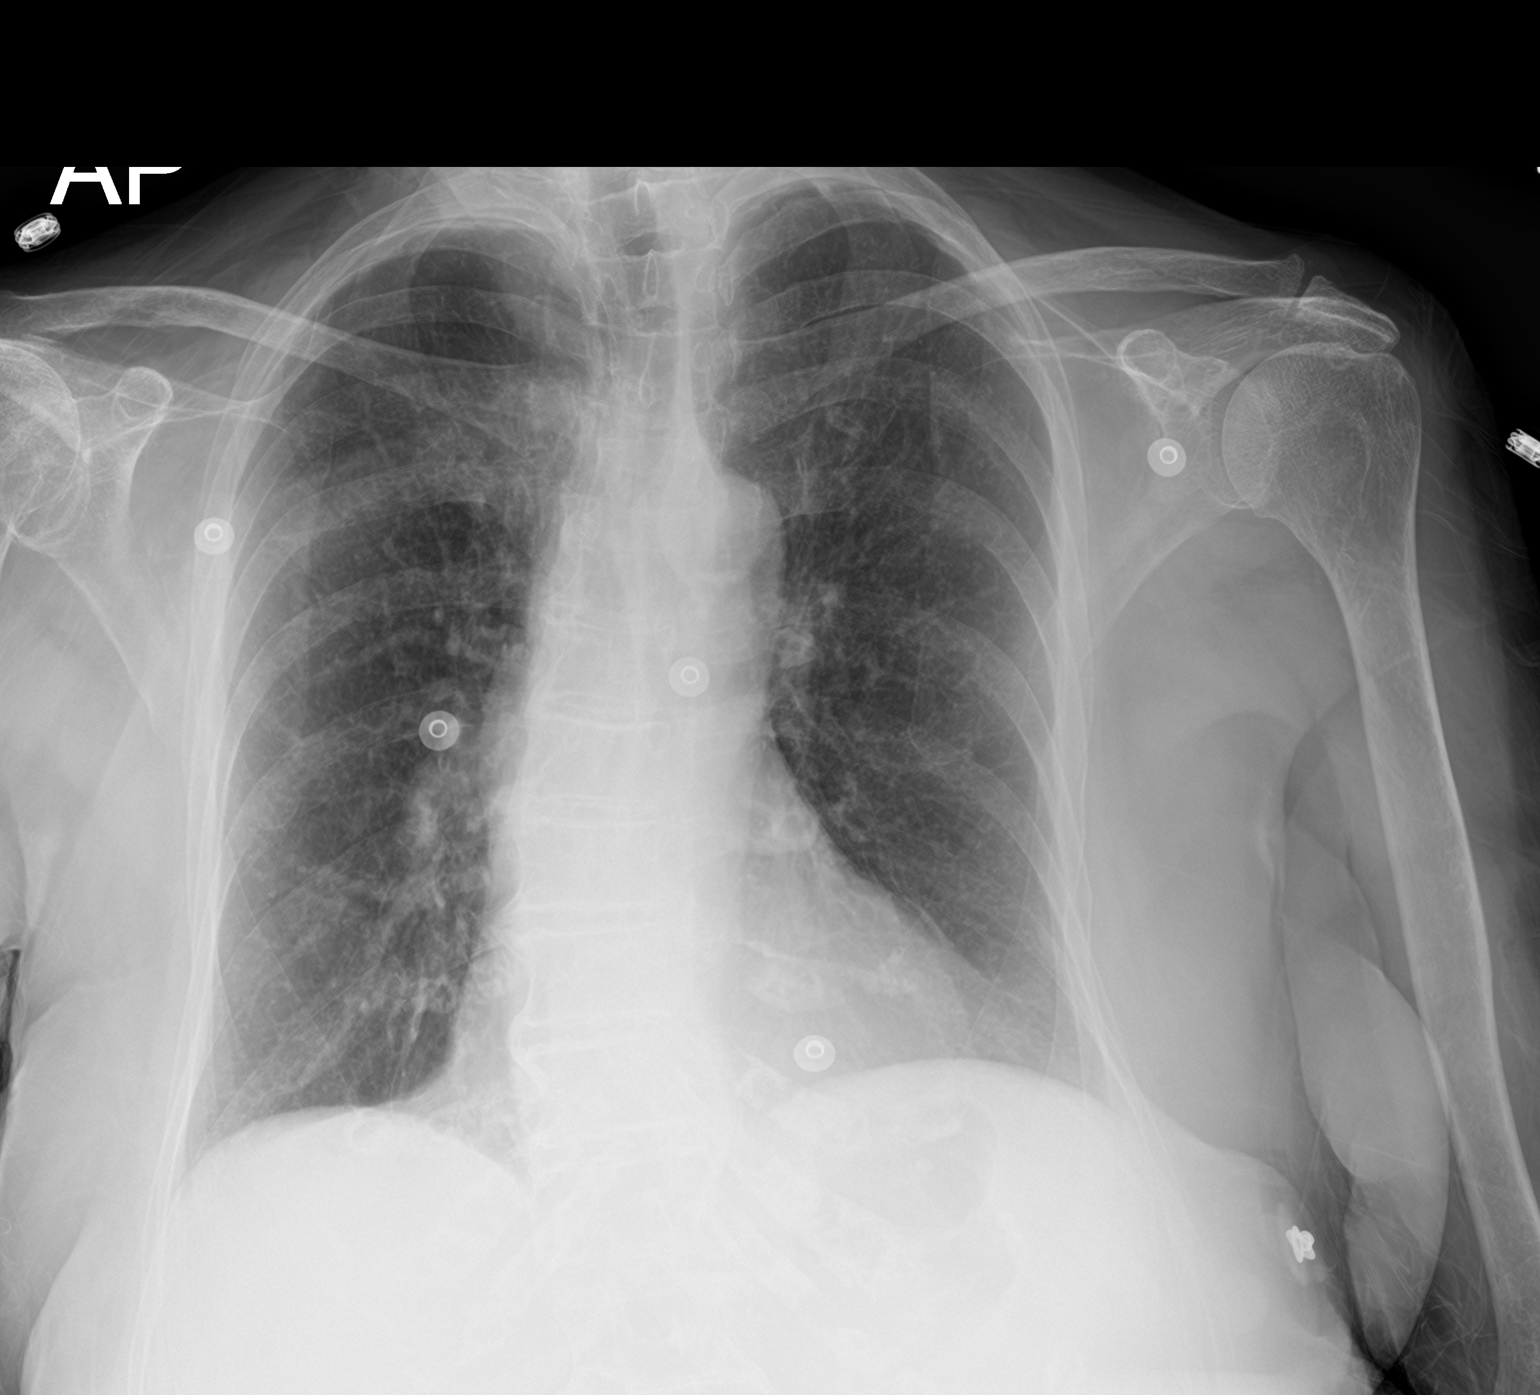

[2 of 2 positions shown; findings below may reference images not displayed]

FINDINGS: The heart size and mediastinal contours are within normal limits.
Both lungs are clear. The visualized skeletal structures are
unremarkable.
IMPRESSION: No active cardiopulmonary disease.

## 2018-06-23 DIAGNOSIS — I10 Essential (primary) hypertension: Secondary | ICD-10-CM | POA: Diagnosis not present

## 2018-06-23 DIAGNOSIS — I25810 Atherosclerosis of coronary artery bypass graft(s) without angina pectoris: Secondary | ICD-10-CM | POA: Diagnosis not present

## 2018-06-23 DIAGNOSIS — J449 Chronic obstructive pulmonary disease, unspecified: Secondary | ICD-10-CM | POA: Diagnosis not present

## 2018-06-23 DIAGNOSIS — I4891 Unspecified atrial fibrillation: Secondary | ICD-10-CM | POA: Diagnosis not present

## 2018-06-23 DIAGNOSIS — D469 Myelodysplastic syndrome, unspecified: Secondary | ICD-10-CM | POA: Diagnosis not present

## 2018-06-23 DIAGNOSIS — I5030 Unspecified diastolic (congestive) heart failure: Secondary | ICD-10-CM | POA: Diagnosis not present

## 2018-06-28 DIAGNOSIS — I25810 Atherosclerosis of coronary artery bypass graft(s) without angina pectoris: Secondary | ICD-10-CM | POA: Diagnosis not present

## 2018-06-28 DIAGNOSIS — D469 Myelodysplastic syndrome, unspecified: Secondary | ICD-10-CM | POA: Diagnosis not present

## 2018-06-28 DIAGNOSIS — I5030 Unspecified diastolic (congestive) heart failure: Secondary | ICD-10-CM | POA: Diagnosis not present

## 2018-06-28 DIAGNOSIS — I4891 Unspecified atrial fibrillation: Secondary | ICD-10-CM | POA: Diagnosis not present

## 2018-06-28 DIAGNOSIS — J449 Chronic obstructive pulmonary disease, unspecified: Secondary | ICD-10-CM | POA: Diagnosis not present

## 2018-06-28 DIAGNOSIS — I10 Essential (primary) hypertension: Secondary | ICD-10-CM | POA: Diagnosis not present

## 2018-07-04 DIAGNOSIS — I5030 Unspecified diastolic (congestive) heart failure: Secondary | ICD-10-CM | POA: Diagnosis not present

## 2018-07-04 DIAGNOSIS — D469 Myelodysplastic syndrome, unspecified: Secondary | ICD-10-CM | POA: Diagnosis not present

## 2018-07-04 DIAGNOSIS — I25810 Atherosclerosis of coronary artery bypass graft(s) without angina pectoris: Secondary | ICD-10-CM | POA: Diagnosis not present

## 2018-07-04 DIAGNOSIS — J449 Chronic obstructive pulmonary disease, unspecified: Secondary | ICD-10-CM | POA: Diagnosis not present

## 2018-07-04 DIAGNOSIS — I4891 Unspecified atrial fibrillation: Secondary | ICD-10-CM | POA: Diagnosis not present

## 2018-07-04 DIAGNOSIS — I10 Essential (primary) hypertension: Secondary | ICD-10-CM | POA: Diagnosis not present

## 2018-07-05 DIAGNOSIS — I25810 Atherosclerosis of coronary artery bypass graft(s) without angina pectoris: Secondary | ICD-10-CM | POA: Diagnosis not present

## 2018-07-05 DIAGNOSIS — I10 Essential (primary) hypertension: Secondary | ICD-10-CM | POA: Diagnosis not present

## 2018-07-05 DIAGNOSIS — I4891 Unspecified atrial fibrillation: Secondary | ICD-10-CM | POA: Diagnosis not present

## 2018-07-05 DIAGNOSIS — J449 Chronic obstructive pulmonary disease, unspecified: Secondary | ICD-10-CM | POA: Diagnosis not present

## 2018-07-05 DIAGNOSIS — I5030 Unspecified diastolic (congestive) heart failure: Secondary | ICD-10-CM | POA: Diagnosis not present

## 2018-07-05 DIAGNOSIS — D469 Myelodysplastic syndrome, unspecified: Secondary | ICD-10-CM | POA: Diagnosis not present

## 2018-07-10 DIAGNOSIS — I10 Essential (primary) hypertension: Secondary | ICD-10-CM | POA: Diagnosis not present

## 2018-07-10 DIAGNOSIS — I5030 Unspecified diastolic (congestive) heart failure: Secondary | ICD-10-CM | POA: Diagnosis not present

## 2018-07-10 DIAGNOSIS — J449 Chronic obstructive pulmonary disease, unspecified: Secondary | ICD-10-CM | POA: Diagnosis not present

## 2018-07-10 DIAGNOSIS — I25810 Atherosclerosis of coronary artery bypass graft(s) without angina pectoris: Secondary | ICD-10-CM | POA: Diagnosis not present

## 2018-07-10 DIAGNOSIS — D469 Myelodysplastic syndrome, unspecified: Secondary | ICD-10-CM | POA: Diagnosis not present

## 2018-07-10 DIAGNOSIS — I4891 Unspecified atrial fibrillation: Secondary | ICD-10-CM | POA: Diagnosis not present

## 2018-07-11 DIAGNOSIS — J449 Chronic obstructive pulmonary disease, unspecified: Secondary | ICD-10-CM | POA: Diagnosis not present

## 2018-07-11 DIAGNOSIS — I25810 Atherosclerosis of coronary artery bypass graft(s) without angina pectoris: Secondary | ICD-10-CM | POA: Diagnosis not present

## 2018-07-11 DIAGNOSIS — D469 Myelodysplastic syndrome, unspecified: Secondary | ICD-10-CM | POA: Diagnosis not present

## 2018-07-11 DIAGNOSIS — I4891 Unspecified atrial fibrillation: Secondary | ICD-10-CM | POA: Diagnosis not present

## 2018-07-11 DIAGNOSIS — I10 Essential (primary) hypertension: Secondary | ICD-10-CM | POA: Diagnosis not present

## 2018-07-11 DIAGNOSIS — I5030 Unspecified diastolic (congestive) heart failure: Secondary | ICD-10-CM | POA: Diagnosis not present

## 2018-07-12 ENCOUNTER — Non-Acute Institutional Stay (SKILLED_NURSING_FACILITY): Payer: Medicare Other | Admitting: Adult Health

## 2018-07-12 DIAGNOSIS — D469 Myelodysplastic syndrome, unspecified: Secondary | ICD-10-CM

## 2018-07-12 DIAGNOSIS — R5383 Other fatigue: Secondary | ICD-10-CM

## 2018-07-12 DIAGNOSIS — J811 Chronic pulmonary edema: Secondary | ICD-10-CM | POA: Diagnosis not present

## 2018-07-12 DIAGNOSIS — M2550 Pain in unspecified joint: Secondary | ICD-10-CM | POA: Diagnosis not present

## 2018-07-12 DIAGNOSIS — I5032 Chronic diastolic (congestive) heart failure: Secondary | ICD-10-CM

## 2018-07-12 DIAGNOSIS — I4821 Permanent atrial fibrillation: Secondary | ICD-10-CM | POA: Diagnosis not present

## 2018-07-12 NOTE — Progress Notes (Signed)
Location:  Occupational psychologist of Service:  SNF (31) Provider:   Cindi Carbon, ANP [place] [phone]   Gayland Curry, DO  Patient Care Team: Gayland Curry, DO as PCP - General (Geriatric Medicine) Brand Males, MD as Consulting Physician (Pulmonary Disease) [name], MD as Consulting Physician (Orthopedic Surgery) Ralene Bathe, MD as Consulting Physician (Ophthalmology) Pyrtle, Lajuan Lines, MD as Consulting Physician (Gastroenterology) Royal Hawthorn, NP as Nurse Practitioner (Nurse Practitioner) Court Joy, MD as Consulting Physician (Internal Medicine)  No emergency contact information on file.  Code Status:  DNR Goals of care: Advanced Directive information Advanced Directives 06/05/2018  Does Patient Have a Medical Advance Directive? Yes  Type of Paramedic of [place];Living will;Out of facility DNR (pink MOST or yellow form)  Does patient want to make changes to medical advance directive? No - Patient declined  Copy of [place] in Chart? Yes  Would patient like information on creating a medical advance directive? -  Pre-existing out of facility DNR order (yellow form or pink MOST form) Yellow form placed in chart (order not valid for inpatient use);Pink MOST form placed in chart (order not valid for inpatient use)     Chief Complaint  Patient presents with  . Acute Visit    joint pain, weakness    HPI:  Pt is a 84 y.o. female seen today for an acute visit for joint pain and weakness. She is followed by hospice for MPD, CHF, afib, and COPD. She has been in bed the past 4 days and decided to wear briefs so she did not have to get up frequently to go to the bathroom.  She has felt joint pain in both shoulders, hips, and knees. She feels tired and does not want to leave her room. She has not had any chest pain, sob, cough, runny nose, dysuria, bladder pain, fever,  decreased 02 sats etc.  The resident and her husband are concerned that she may have pna.   Staff also report that she has become more confused and has had bad dreams. Ambien was discontinued for this reason and she denies any further bad dreams but staff continue to report increased confusion.   MPD: Hgb 8.8, WBC 2.4, plt 645 on 06/11/18.  No longer on meds and is not followed by oncology any more as her goals of care are comfort based. Despite this she has been very active in the skilled unit typically and participates in activities/goes out to eat etc.  She had felt dizzy and weak last month and she had a syncopal episode and we tried reducing her cardizem to 240 mg but her HR went up so it was returned back to 360.  Later I reduced the lasix to 20 mg questioning if this was causing her dizziness and this seemed to help.   Past Medical History:  Diagnosis Date  . ALLERGIC RHINITIS   . Anxiety   . Asthma   . Atrial fibrillation ([place])   . Back pain of thoracolumbar region    right  . Bronchiectasis   . Colles' fracture of left radius   . COPD (chronic obstructive pulmonary disease) ([place])   . Coronary artery disease   . Diastolic dysfunction   . Diverticulosis   . Dysrhythmia   . Esophageal stricture   . Hiatal hernia   . History of pneumonia   . Hx of cardiovascular stress test    a. ETT-MV 1/14:  Exercised 4:16, no ECG changes, poor ex tol, ant defect c/w soft tissue atten, no ischemia, EF 75%  . Hx of colonic polyp   . Hyperlipidemia   . Hypertension   . IBS (irritable bowel syndrome)   . Insomnia   . Multiple rib fractures 10/2008   bilateral  . Pleural effusion, left   . Pruritus   . Pulmonary nodule   . Syncope    Past Surgical History:  Procedure Laterality Date  . ANKLE CLOSED REDUCTION Right 12/11/2014   Procedure: CLOSED REDUCTION ANKLE;  Surgeon: Rod Can, MD;  Location: [facility];  Service: Orthopedics;  Laterality: Right;  . APPENDECTOMY    . BLADDER  SUSPENSION  2005   A-P  . CHOLECYSTECTOMY    . COLONOSCOPY  04/06/2012   Procedure: COLONOSCOPY;  Surgeon: Jerene Bears, MD;  Location: [facility];  Service: Gastroenterology;  Laterality: N/A;  . EXTERNAL FIXATION LEG Right 12/12/2014   Procedure: ADJUSTMENT OF EXTERNAL FIXATION  RIGHT ANKLE;  Surgeon: Rod Can, MD;  Location: [facility];  Service: Orthopedics;  Laterality: Right;  . EXTERNAL FIXATION LEG Right 12/11/2014   Procedure: POSSIBLE EXTERNAL FIXATION RIGHT ANKLE;  Surgeon: Rod Can, MD;  Location: [facility];  Service: Orthopedics;  Laterality: Right;  . HARDWARE REMOVAL Right 12/25/2014   Procedure: RIGHT ANKLE REMOVE OF DEEP IMPLANT ANE EXTERNAL FIXATOR ;  Surgeon: [name], MD;  Location: [facility];  Service: Orthopedics;  Laterality: Right;  . ORIF ANKLE FRACTURE Right 12/25/2014   Procedure: OPEN REDUCTION INTERNAL FIXATION (ORIF)TRIMALLEOLAR  ANKLE FRACTURE;  Surgeon: [name], MD;  Location: [facility];  Service: Orthopedics;  Laterality: Right;  . ORIF WRIST FRACTURE Left 2010     Dr. Burney Gauze.  Marland Kitchen POLYPECTOMY    . ROTATOR CUFF REPAIR Right 2006  . TONSILLECTOMY    . TOTAL KNEE ARTHROPLASTY Right 08/2010    Allergies  Allergen Reactions  . Albuterol Sulfate Palpitations  . Levaquin [Levofloxacin In D5w] Other (See Comments)    QT prolongation. Should avoid all flouroquinolones.   . Lactose Intolerance (Gi) Other (See Comments)  . Amoxicillin Other (See Comments)    REACTION: unspecified  . Codeine Other (See Comments)    Can take Hydrocodone  . Penicillins Hives, Itching and Rash    Hard lump and rash Has patient had a PCN reaction causing immediate rash, facial/tongue/throat swelling, SOB or lightheadedness with hypotension: Yes Has patient had a PCN reaction causing severe rash involving mucus membranes or skin necrosis: No Has patient had a PCN reaction that required hospitalization unknown Has patient had a PCN reaction  occurring within the last 10 years: No If all of the above answers are "NO", then may proceed with Cephalosporin use.  . Sulfa Antibiotics Nausea Only  . Sulfonamide Derivatives Nausea Only    Outpatient Encounter Medications as of 07/12/2018  Medication Sig  . acetaminophen (TYLENOL) 500 MG tablet Take 1,000 mg by mouth 2 (two) times daily as needed for headache (pain). Not to exceed 3000mg  daily  . aspirin EC 81 MG tablet Take 1 tablet (81 mg total) by mouth daily.  . bimatoprost (LUMIGAN) 0.01 % SOLN Place 1 drop into both eyes at bedtime.  . cetirizine (ZYRTEC) 10 MG tablet Take 10 mg by mouth daily.  Marland Kitchen dicyclomine (BENTYL) 10 MG capsule Take 1 capsule (10 mg total) by mouth 3 (three) times daily before meals. IBS  . diltiazem (CARDIZEM CD) 360 MG 24 hr capsule Take 360 mg  by mouth daily.  . Emollient (EUCERIN) lotion Apply topically as needed for dry skin.  . furosemide (LASIX) 20 MG tablet Take 20 mg by mouth.  Marland Kitchen ipratropium (ATROVENT) 0.02 % nebulizer solution Take 0.5 mg by nebulization 2 (two) times daily.  . iron polysaccharides (NIFEREX) 150 MG capsule Take 150 mg by mouth daily.  Marland Kitchen levalbuterol (XOPENEX) 1.25 MG/0.5ML nebulizer solution Take 1.25 mg by nebulization every 6 (six) hours as needed for wheezing or shortness of breath.  . loperamide (IMODIUM) 2 MG capsule Take 2 mg by mouth as needed for diarrhea or loose stools.   . magnesium oxide (MAG-OX) 400 MG tablet Take 400 mg by mouth daily as needed (leg cramps).  . Menthol, Topical Analgesic, (BIOFREEZE) 4 % GEL Apply topically 2 (two) times daily. To right shoulder  . Multiple Vitamin (MULTIVITAMIN WITH MINERALS) TABS tablet Take 1 tablet by mouth daily.  Marland Kitchen oxycodone (OXY-IR) 5 MG capsule Take 5 mg by mouth every 4 (four) hours as needed.  . potassium chloride (K-DUR) 10 MEQ tablet Take 10 mEq by mouth daily.  . ranitidine (ZANTAC) 150 MG tablet Take 150 mg by mouth 2 (two) times daily.  . [DISCONTINUED] diltiazem  (CARDIZEM CD) 240 MG 24 hr capsule Take 240 mg by mouth daily.  . [DISCONTINUED] furosemide (LASIX) 40 MG tablet Take 40 mg by mouth daily.  . [DISCONTINUED] potassium chloride SA (K-DUR,KLOR-CON) 20 MEQ tablet Take 20 mEq by mouth daily.  . [DISCONTINUED] zolpidem (AMBIEN) 5 MG tablet Take 1 tablet (5 mg total) by mouth at bedtime as needed for sleep. (Patient taking differently: Take 5 mg by mouth at bedtime. )   No facility-administered encounter medications on file as of 07/12/2018.     Review of Systems  Constitutional: Positive for activity change and fatigue. Negative for appetite change, chills, diaphoresis, fever and unexpected weight change.  HENT: Negative for congestion, postnasal drip, rhinorrhea, sore throat and trouble swallowing.   Respiratory: Negative for cough, shortness of breath and wheezing.   Cardiovascular: Negative for chest pain, palpitations and leg swelling.  Gastrointestinal: Negative for abdominal distention, abdominal pain, constipation and diarrhea.  Genitourinary: Negative for difficulty urinating and dysuria.  Musculoskeletal: Positive for arthralgias. Negative for back pain, gait problem, joint swelling and myalgias.  Neurological: Positive for weakness. Negative for dizziness, tremors, seizures, syncope, facial asymmetry, speech difficulty, light-headedness, numbness and headaches.  Psychiatric/Behavioral: Positive for confusion. Negative for agitation and behavioral problems.    Immunization History  Administered Date(s) Administered  . H1N1 09/30/2008  . Influenza Split 05/30/2012, 07/01/2013  . Influenza Whole 06/19/2009, 06/20/2011  . Influenza, High Dose Seasonal PF 07/01/2013  . Influenza,inj,Quad PF,6+ Mos 06/05/2014  . Influenza-Unspecified 07/19/2017  . Pneumococcal Conjugate-13 08/20/2013  . Pneumococcal Polysaccharide-23 11/18/1992, 08/25/2010  . Tetanus 08/20/2013  . Zoster 09/19/2004  . Zoster Recombinat (Shingrix) 10/20/2017    Pertinent  Health Maintenance Due  Topic Date Due  . DEXA SCAN  03/15/1993  . INFLUENZA VACCINE  04/19/2018  . PNA vac Low Risk Adult  Completed   Fall Risk  06/06/2014  Falls in the past year? No   Functional Status Survey:    Vitals:   07/12/18 1448  Weight: 129 lb 9.6 oz (58.8 kg)   Body mass index is 21.9 kg/m.  Wt Readings from Last 3 Encounters:  07/12/18 129 lb 9.6 oz (58.8 kg)  06/11/18 130 lb (59 kg)  06/05/18 130 lb (59 kg)   Physical Exam  Constitutional: She is oriented to person,  place, and time. No distress.  HENT:  Head: Normocephalic and atraumatic.  Right Ear: External ear normal.  Left Ear: External ear normal.  Nose: Nose normal.  Mouth/Throat: Oropharynx is clear and moist. No oropharyngeal exudate.  Eyes: Pupils are equal, round, and reactive to light. Conjunctivae are normal. Right eye exhibits no discharge. Left eye exhibits no discharge.  Neck: Normal range of motion. Neck supple. No JVD present. No tracheal deviation present. No thyromegaly present.  Cardiovascular: Normal rate.  No murmur heard. Irreg, no edema  Pulmonary/Chest: Effort normal. No respiratory distress. She has wheezes. She has rales.  Neurological: She is alert and oriented to person, place, and time. No cranial nerve deficit.  Skin: Skin is warm and dry. She is not diaphoretic.  Psychiatric: She has a normal mood and affect.  Nursing note and vitals reviewed.   Labs reviewed: Recent Labs    01/16/18 0737  NA 137  K 5.2  BUN 28*  CREATININE 0.8   Recent Labs    01/16/18 0737  AST 31  ALT 9  ALKPHOS 90   Recent Labs    01/16/18 0737 06/11/18  WBC 2.6 2.4  NEUTROABS  --  1  HGB 9.3* 8.8*  HCT 28* 27*  PLT 1,028* 645*   Lab Results  Component Value Date   TSH 2.712 03/02/2017   Lab Results  Component Value Date   HGBA1C (H) 10/04/2010    5.7 (NOTE)                                                                       According to the ADA Clinical  Practice Recommendations for 2011, when HbA1c is used as a screening test:   >=6.5%   Diagnostic of Diabetes Mellitus           (if abnormal result  is confirmed)  5.7-6.4%   Increased risk of developing Diabetes Mellitus  References:Diagnosis and Classification of Diabetes Mellitus,Diabetes IBBC,4888,91(QXIHW 1):S62-S69 and Standards of Medical Care in         Diabetes - 2011,Diabetes Care,2011,34  (Suppl 1):S11-S61.   Lab Results  Component Value Date   CHOL 247 (H) 06/05/2014   HDL 75.60 06/05/2014   LDLCALC 156 (H) 06/05/2014   LDLDIRECT 157.6 01/16/2012   TRIG 78.0 06/05/2014   CHOLHDL 3 06/05/2014    Significant Diagnostic Results in last 30 days:  No results found.  Assessment/Plan 1. Generalized joint pain ?if this is due to underlying viral illness or associated with MPD  2. Fatigue, unspecified type See #1 Due to abnormal lung sounds will order CXR  3. Permanent atrial fibrillation Rate controlled with Cardizem 360 mg Not on anticoagulation due to falls  4. Chronic diastolic congestive heart failure (HCC) Compensated, doing well with lasix 20 mg qd  5. MDS/MPN (myelodysplastic/myeloproliferative neoplasms) ([place] Continue to monitor.  Followed by hospice   Family/ staff Communication: resident and staff  Labs/tests ordered:  CBC, CXR 2 view

## 2018-07-13 DIAGNOSIS — D649 Anemia, unspecified: Secondary | ICD-10-CM | POA: Diagnosis not present

## 2018-07-13 DIAGNOSIS — Z79899 Other long term (current) drug therapy: Secondary | ICD-10-CM | POA: Diagnosis not present

## 2018-07-13 LAB — CBC AND DIFFERENTIAL
HCT: 20 — AB (ref 36–46)
Hemoglobin: 6.8 — AB (ref 12.0–16.0)
NEUTROS ABS: 6
Platelets: 382 (ref 150–399)
WBC: 6.8

## 2018-07-16 ENCOUNTER — Encounter: Payer: Self-pay | Admitting: Adult Health

## 2018-07-16 ENCOUNTER — Non-Acute Institutional Stay (SKILLED_NURSING_FACILITY): Payer: Medicare Other | Admitting: Adult Health

## 2018-07-16 DIAGNOSIS — M25512 Pain in left shoulder: Secondary | ICD-10-CM | POA: Diagnosis not present

## 2018-07-16 DIAGNOSIS — F22 Delusional disorders: Secondary | ICD-10-CM | POA: Diagnosis not present

## 2018-07-16 DIAGNOSIS — M25511 Pain in right shoulder: Secondary | ICD-10-CM | POA: Diagnosis not present

## 2018-07-16 DIAGNOSIS — Z79899 Other long term (current) drug therapy: Secondary | ICD-10-CM | POA: Diagnosis not present

## 2018-07-16 DIAGNOSIS — K5901 Slow transit constipation: Secondary | ICD-10-CM

## 2018-07-16 DIAGNOSIS — D469 Myelodysplastic syndrome, unspecified: Secondary | ICD-10-CM | POA: Diagnosis not present

## 2018-07-16 DIAGNOSIS — D649 Anemia, unspecified: Secondary | ICD-10-CM | POA: Diagnosis not present

## 2018-07-16 LAB — CBC AND DIFFERENTIAL
HCT: 22 — AB (ref 36–46)
Hemoglobin: 7.1 — AB (ref 12.0–16.0)
Neutrophils Absolute: 8
Platelets: 431 — AB (ref 150–399)
WBC: 10.3

## 2018-07-16 NOTE — Progress Notes (Signed)
Location:  [facility]up[facility] psychologist of Service:  SNF (31) Provider:   Cindi Carbon, ANP [place] [phone]   Gayland Curry, DO  Patient Care Team: Gayland Curry, DO as PCP - General (Geriatric Medicine) Brand Males, MD as Consulting Physician (Pulmonary Disease) [name], MD as Consulting Physician (Orthopedic Surgery) Ralene Bathe, MD as Consulting Physician (Ophthalmology) Pyrtle, Lajuan Lines, MD as Consulting Physician (Gastroenterology) Royal Hawthorn, NP as Nurse Practitioner (Nurse Practitioner) Court Joy, MD as Consulting Physician (Internal Medicine)  No emergency contact information on file.  Code Status:  DNR Goals of care: Advanced Directive information Advanced Directives 06/05/2018  Does Patient Have a Medical Advance Directive? Yes  Type of Paramedic of [place];Living will;Out of facility DNR (pink MOST or yellow form)  Does patient want to make changes to medical advance directive? No - Patient declined  Copy of [place] in Chart? Yes  Would patient like information on creating a medical advance directive? -  Pre-existing out of facility DNR order (yellow form or pink MOST form) Yellow form placed in chart (order not valid for inpatient use);Pink MOST form placed in chart (order not valid for inpatient use)     Chief Complaint  Patient presents with  . Acute Visit    f/u anemia    HPI:  Pt is a 84 y.o. female seen today for an acute visit for follow up regarding anemia, as well as fatigue and joint pain.  She has had joint pain to both shoulder for 1 week, as well as her neck and hips at times. She has tried oxycodone and tylenol with mild relief. No fever or joint swelling noted.  The nurse reports she has had periods of confusion where she thought she was somewhere she was not  Or saw something that was not there followed by periods of lucidity. CXR  neg MPD: Her Hgb decreased to 6.8 on 10/25 but was repeated after increased iron and was 7.1. Baseline Hgb 8-9. Interestingly her WBC has increased from a baseline 2.4 to 6.8 on 10/25 and now 10.3. Absolute neutrophil count 8.0.  Platelets have decreased over time to 431 but previously ran 645-1,028,000. She was previously on medication for MPD but decided not to take it anymore one year ago as her goals of care were comfort based and she is followed by Hospice.   Past Medical History:  Diagnosis Date  . ALLERGIC RHINITIS   . Anxiety   . Asthma   . Atrial fibrillation ([place])   . Back pain of thoracolumbar region    right  . Bronchiectasis   . Colles' fracture of left radius   . COPD (chronic obstructive pulmonary disease) ([place])   . Coronary artery disease   . Diastolic dysfunction   . Diverticulosis   . Dysrhythmia   . Esophageal stricture   . Hiatal hernia   . History of pneumonia   . Hx of cardiovascular stress test    a. ETT-MV 1/14: Exercised 4:16, no ECG changes, poor ex tol, ant defect c/w soft tissue atten, no ischemia, EF 75%  . Hx of colonic polyp   . Hyperlipidemia   . Hypertension   . IBS (irritable bowel syndrome)   . Insomnia   . Multiple rib fractures 10/2008   bilateral  . Pleural effusion, left   . Pruritus   . Pulmonary nodule   . Syncope    Past Surgical History:  Procedure Laterality  Date  . ANKLE CLOSED REDUCTION Right 12/11/2014   Procedure: CLOSED REDUCTION ANKLE;  Surgeon: Rod Can, MD;  Location: [facility];  Service: Orthopedics;  Laterality: Right;  . APPENDECTOMY    . BLADDER SUSPENSION  2005   A-P  . CHOLECYSTECTOMY    . COLONOSCOPY  04/06/2012   Procedure: COLONOSCOPY;  Surgeon: Jerene Bears, MD;  Location: [facility];  Service: Gastroenterology;  Laterality: N/A;  . EXTERNAL FIXATION LEG Right 12/12/2014   Procedure: ADJUSTMENT OF EXTERNAL FIXATION  RIGHT ANKLE;  Surgeon: Rod Can, MD;  Location: [facility];  Service: Orthopedics;   Laterality: Right;  . EXTERNAL FIXATION LEG Right 12/11/2014   Procedure: POSSIBLE EXTERNAL FIXATION RIGHT ANKLE;  Surgeon: Rod Can, MD;  Location: [facility];  Service: Orthopedics;  Laterality: Right;  . HARDWARE REMOVAL Right 12/25/2014   Procedure: RIGHT ANKLE REMOVE OF DEEP IMPLANT ANE EXTERNAL FIXATOR ;  Surgeon: [name], MD;  Location: [facility];  Service: Orthopedics;  Laterality: Right;  . ORIF ANKLE FRACTURE Right 12/25/2014   Procedure: OPEN REDUCTION INTERNAL FIXATION (ORIF)TRIMALLEOLAR  ANKLE FRACTURE;  Surgeon: [name], MD;  Location: [facility];  Service: Orthopedics;  Laterality: Right;  . ORIF WRIST FRACTURE Left 2010     Dr. Burney Gauze.  Marland Kitchen POLYPECTOMY    . ROTATOR CUFF REPAIR Right 2006  . TONSILLECTOMY    . TOTAL KNEE ARTHROPLASTY Right 08/2010    Allergies  Allergen Reactions  . Albuterol Sulfate Palpitations  . Levaquin [Levofloxacin In D5w] Other (See Comments)    QT prolongation. Should avoid all flouroquinolones.   . Lactose Intolerance (Gi) Other (See Comments)  . Amoxicillin Other (See Comments)    REACTION: unspecified  . Codeine Other (See Comments)    Can take Hydrocodone  . Penicillins Hives, Itching and Rash    Hard lump and rash Has patient had a PCN reaction causing immediate rash, facial/tongue/throat swelling, SOB or lightheadedness with hypotension: Yes Has patient had a PCN reaction causing severe rash involving mucus membranes or skin necrosis: No Has patient had a PCN reaction that required hospitalization unknown Has patient had a PCN reaction occurring within the last 10 years: No If all of the above answers are "NO", then may proceed with Cephalosporin use.  . Sulfa Antibiotics Nausea Only  . Sulfonamide Derivatives Nausea Only    Outpatient Encounter Medications as of 07/16/2018  Medication Sig  . acetaminophen (TYLENOL) 500 MG tablet Take 1,000 mg by mouth 2 (two) times daily as needed for headache  (pain). Not to exceed 3046m daily  . aspirin EC 81 MG tablet Take 1 tablet (81 mg total) by mouth daily.  . bimatoprost (LUMIGAN) 0.01 % SOLN Place 1 drop into both eyes at bedtime.  . cetirizine (ZYRTEC) 10 MG tablet Take 10 mg by mouth daily.  .Marland Kitchendicyclomine (BENTYL) 10 MG capsule Take 1 capsule (10 mg total) by mouth 3 (three) times daily before meals. IBS  . diltiazem (CARDIZEM CD) 360 MG 24 hr capsule Take 360 mg by mouth daily.  . Emollient (EUCERIN) lotion Apply topically as needed for dry skin.  . furosemide (LASIX) 20 MG tablet Take 20 mg by mouth.  .Marland Kitchenipratropium (ATROVENT) 0.02 % nebulizer solution Take 0.5 mg by nebulization 2 (two) times daily.  . iron polysaccharides (NIFEREX) 150 MG capsule Take 150 mg by mouth 3 (three) times daily.   .Marland Kitchenlevalbuterol (XOPENEX) 1.25 MG/0.5ML nebulizer solution Take 1.25 mg by nebulization every 6 (six) hours as  needed for wheezing or shortness of breath.  . loperamide (IMODIUM) 2 MG capsule Take 2 mg by mouth as needed for diarrhea or loose stools.   . magnesium oxide (MAG-OX) 400 MG tablet Take 400 mg by mouth daily as needed (leg cramps).  . Menthol, Topical Analgesic, (BIOFREEZE) 4 % GEL Apply topically 2 (two) times daily. To right shoulder  . Multiple Vitamin (MULTIVITAMIN WITH MINERALS) TABS tablet Take 1 tablet by mouth daily.  Marland Kitchen oxycodone (OXY-IR) 5 MG capsule Take 5 mg by mouth every 4 (four) hours as needed.  . potassium chloride (K-DUR) 10 MEQ tablet Take 10 mEq by mouth daily.  . ranitidine (ZANTAC) 150 MG tablet Take 150 mg by mouth 2 (two) times daily.   No facility-administered encounter medications on file as of 07/16/2018.     Review of Systems  Constitutional: Positive for activity change and fatigue. Negative for appetite change, chills, diaphoresis, fever and unexpected weight change.  HENT: Negative for congestion.   Respiratory: Negative for cough, shortness of breath and wheezing.   Cardiovascular: Negative for chest  pain, palpitations and leg swelling.  Gastrointestinal: Negative for abdominal distention, abdominal pain, constipation and diarrhea.  Genitourinary: Negative for difficulty urinating and dysuria.  Musculoskeletal: Positive for arthralgias, back pain and gait problem. Negative for joint swelling and myalgias.  Neurological: Negative for dizziness, tremors, seizures, syncope, facial asymmetry, speech difficulty, weakness, light-headedness, numbness and headaches.  Psychiatric/Behavioral: Positive for confusion and hallucinations. Negative for agitation and behavioral problems.    Immunization History  Administered Date(s) Administered  . H1N1 09/30/2008  . Influenza Split 05/30/2012, 07/01/2013  . Influenza Whole 06/19/2009, 06/20/2011  . Influenza, High Dose Seasonal PF 07/01/2013  . Influenza,inj,Quad PF,6+ Mos 06/05/2014  . Influenza-Unspecified 07/19/2017  . Pneumococcal Conjugate-13 08/20/2013  . Pneumococcal Polysaccharide-23 11/18/1992, 08/25/2010  . Tetanus 08/20/2013  . Zoster 09/19/2004  . Zoster Recombinat (Shingrix) 10/20/2017   Pertinent  Health Maintenance Due  Topic Date Due  . DEXA SCAN  03/15/1993  . INFLUENZA VACCINE  04/19/2018  . PNA vac Low Risk Adult  Completed   Fall Risk  06/06/2014  Falls in the past year? No   Functional Status Survey:    Vitals:   07/16/18 1440  BP: (!) 106/57  Pulse: 81  Resp: 20  Temp: (!) 97.2 F (36.2 C)  SpO2: 91%   There is no height or weight on file to calculate BMI.   Physical Exam  Constitutional: She is oriented to person, place, and time. No distress.  HENT:  Head: Normocephalic and atraumatic.  Eyes: Pupils are equal, round, and reactive to light. Conjunctivae are normal. Right eye exhibits no discharge. Left eye exhibits no discharge.  Neck: No JVD present. No tracheal deviation present. No thyromegaly present.  Cardiovascular: Normal rate and regular rhythm.  No murmur heard. Pulmonary/Chest: Effort normal  and breath sounds normal. No respiratory distress. She has no wheezes.  Abdominal: Soft. Bowel sounds are normal. She exhibits distension (slightly ).  Musculoskeletal: Normal range of motion. She exhibits no edema, tenderness or deformity.  Lymphadenopathy:    She has no cervical adenopathy.  Neurological: She is alert and oriented to person, place, and time.  Skin: Skin is warm and dry. She is not diaphoretic.  Psychiatric: She has a normal mood and affect.  Nursing note and vitals reviewed.   Labs reviewed: Recent Labs    01/16/18 0737  NA 137  K 5.2  BUN 28*  CREATININE 0.8   Recent Labs  01/16/18 0737  AST 31  ALT 9  ALKPHOS 90   Recent Labs    06/11/18 07/13/18 07/16/18  WBC 2.4 6.8 10.3  NEUTROABS 1 6 8   HGB 8.8* 6.8* 7.1*  HCT 27* 20* 22*  PLT 645* 382 431*   Lab Results  Component Value Date   TSH 2.712 03/02/2017   Lab Results  Component Value Date   HGBA1C (H) 10/04/2010    5.7 (NOTE)                                                                       According to the ADA Clinical Practice Recommendations for 2011, when HbA1c is used as a screening test:   >=6.5%   Diagnostic of Diabetes Mellitus           (if abnormal result  is confirmed)  5.7-6.4%   Increased risk of developing Diabetes Mellitus  References:Diagnosis and Classification of Diabetes Mellitus,Diabetes GBMS,1115,52(CEYEM 1):S62-S69 and Standards of Medical Care in         Diabetes - 2011,Diabetes Care,2011,34  (Suppl 1):S11-S61.   Lab Results  Component Value Date   CHOL 247 (H) 06/05/2014   HDL 75.60 06/05/2014   LDLCALC 156 (H) 06/05/2014   LDLDIRECT 157.6 01/16/2012   TRIG 78.0 06/05/2014   CHOLHDL 3 06/05/2014    Significant Diagnostic Results in last 30 days:  No results found.  Assessment/Plan  1. Delusions ([place]) With a hx of mild cognitive impairment  ?progression in myeloproliferative disorder vs underlying infection Will check UA due to delusions/hallucinations  with left shift  2. MDS/MPN (myelodysplastic/myeloproliferative neoplasms) ([place]) She is not interested in treatment to prolong her life but would like treatment for things that are easily reversible and would make her feel better. She did not want to go back to oncology at this point but may be willing to if there recommendations are needed. Continue hospice care. Check B12, folate, and iron panel  3. Pain of both shoulder joints ? If this is due to #2, underlying infection, or inflammatory process Will check ESR and CRP Schedule tylenol 1000 mg BID and may use oxycodone prn as ordered.   4. Constipation Senokot s 1 tab BID prn    Family/ staff Communication: staff/resident. Attempted to call her husband but I was not able to leave a voicemail  Labs/tests ordered:  CMP ESR CRP iron panel B12 and folate UA C and S

## 2018-07-17 DIAGNOSIS — D649 Anemia, unspecified: Secondary | ICD-10-CM | POA: Diagnosis not present

## 2018-07-17 DIAGNOSIS — I25810 Atherosclerosis of coronary artery bypass graft(s) without angina pectoris: Secondary | ICD-10-CM | POA: Diagnosis not present

## 2018-07-17 DIAGNOSIS — D469 Myelodysplastic syndrome, unspecified: Secondary | ICD-10-CM | POA: Diagnosis not present

## 2018-07-17 DIAGNOSIS — M25549 Pain in joints of unspecified hand: Secondary | ICD-10-CM | POA: Diagnosis not present

## 2018-07-17 DIAGNOSIS — D519 Vitamin B12 deficiency anemia, unspecified: Secondary | ICD-10-CM | POA: Diagnosis not present

## 2018-07-17 DIAGNOSIS — I10 Essential (primary) hypertension: Secondary | ICD-10-CM | POA: Diagnosis not present

## 2018-07-17 DIAGNOSIS — I5030 Unspecified diastolic (congestive) heart failure: Secondary | ICD-10-CM | POA: Diagnosis not present

## 2018-07-17 DIAGNOSIS — E785 Hyperlipidemia, unspecified: Secondary | ICD-10-CM | POA: Diagnosis not present

## 2018-07-17 DIAGNOSIS — I4891 Unspecified atrial fibrillation: Secondary | ICD-10-CM | POA: Diagnosis not present

## 2018-07-17 DIAGNOSIS — M6281 Muscle weakness (generalized): Secondary | ICD-10-CM | POA: Diagnosis not present

## 2018-07-17 DIAGNOSIS — Z79899 Other long term (current) drug therapy: Secondary | ICD-10-CM | POA: Diagnosis not present

## 2018-07-17 DIAGNOSIS — M2550 Pain in unspecified joint: Secondary | ICD-10-CM | POA: Diagnosis not present

## 2018-07-17 DIAGNOSIS — N390 Urinary tract infection, site not specified: Secondary | ICD-10-CM | POA: Diagnosis not present

## 2018-07-17 DIAGNOSIS — R319 Hematuria, unspecified: Secondary | ICD-10-CM | POA: Diagnosis not present

## 2018-07-17 DIAGNOSIS — J449 Chronic obstructive pulmonary disease, unspecified: Secondary | ICD-10-CM | POA: Diagnosis not present

## 2018-07-17 LAB — BASIC METABOLIC PANEL
BUN: 17 (ref 4–21)
Creatinine: 0.7 (ref 0.5–1.1)
GLUCOSE: 136
POTASSIUM: 4.6 (ref 3.4–5.3)
SODIUM: 136 — AB (ref 137–147)

## 2018-07-17 LAB — POCT ERYTHROCYTE SEDIMENTATION RATE, NON-AUTOMATED: Sed Rate: 41

## 2018-07-17 LAB — VITAMIN B12: Vitamin B-12: 2000

## 2018-07-17 LAB — IRON,TIBC AND FERRITIN PANEL: Iron: 31

## 2018-07-20 DIAGNOSIS — I25810 Atherosclerosis of coronary artery bypass graft(s) without angina pectoris: Secondary | ICD-10-CM | POA: Diagnosis not present

## 2018-07-20 DIAGNOSIS — D473 Essential (hemorrhagic) thrombocythemia: Secondary | ICD-10-CM | POA: Diagnosis not present

## 2018-07-20 DIAGNOSIS — J449 Chronic obstructive pulmonary disease, unspecified: Secondary | ICD-10-CM | POA: Diagnosis not present

## 2018-07-20 DIAGNOSIS — D630 Anemia in neoplastic disease: Secondary | ICD-10-CM | POA: Diagnosis not present

## 2018-07-20 DIAGNOSIS — H11149 Conjunctival xerosis, unspecified, unspecified eye: Secondary | ICD-10-CM | POA: Diagnosis not present

## 2018-07-20 DIAGNOSIS — R0902 Hypoxemia: Secondary | ICD-10-CM | POA: Diagnosis not present

## 2018-07-20 DIAGNOSIS — D469 Myelodysplastic syndrome, unspecified: Secondary | ICD-10-CM | POA: Diagnosis not present

## 2018-07-20 DIAGNOSIS — D72818 Other decreased white blood cell count: Secondary | ICD-10-CM | POA: Diagnosis not present

## 2018-07-20 DIAGNOSIS — K589 Irritable bowel syndrome without diarrhea: Secondary | ICD-10-CM | POA: Diagnosis not present

## 2018-07-20 DIAGNOSIS — I4891 Unspecified atrial fibrillation: Secondary | ICD-10-CM | POA: Diagnosis not present

## 2018-07-20 DIAGNOSIS — I5030 Unspecified diastolic (congestive) heart failure: Secondary | ICD-10-CM | POA: Diagnosis not present

## 2018-07-20 DIAGNOSIS — K831 Obstruction of bile duct: Secondary | ICD-10-CM | POA: Diagnosis not present

## 2018-07-20 DIAGNOSIS — R54 Age-related physical debility: Secondary | ICD-10-CM | POA: Diagnosis not present

## 2018-07-20 DIAGNOSIS — J918 Pleural effusion in other conditions classified elsewhere: Secondary | ICD-10-CM | POA: Diagnosis not present

## 2018-07-20 DIAGNOSIS — I10 Essential (primary) hypertension: Secondary | ICD-10-CM | POA: Diagnosis not present

## 2018-07-20 DIAGNOSIS — K219 Gastro-esophageal reflux disease without esophagitis: Secondary | ICD-10-CM | POA: Diagnosis not present

## 2018-07-23 DIAGNOSIS — I25810 Atherosclerosis of coronary artery bypass graft(s) without angina pectoris: Secondary | ICD-10-CM | POA: Diagnosis not present

## 2018-07-23 DIAGNOSIS — I4891 Unspecified atrial fibrillation: Secondary | ICD-10-CM | POA: Diagnosis not present

## 2018-07-23 DIAGNOSIS — D469 Myelodysplastic syndrome, unspecified: Secondary | ICD-10-CM | POA: Diagnosis not present

## 2018-07-23 DIAGNOSIS — J449 Chronic obstructive pulmonary disease, unspecified: Secondary | ICD-10-CM | POA: Diagnosis not present

## 2018-07-23 DIAGNOSIS — I10 Essential (primary) hypertension: Secondary | ICD-10-CM | POA: Diagnosis not present

## 2018-07-23 DIAGNOSIS — I5030 Unspecified diastolic (congestive) heart failure: Secondary | ICD-10-CM | POA: Diagnosis not present

## 2018-07-26 DIAGNOSIS — H401132 Primary open-angle glaucoma, bilateral, moderate stage: Secondary | ICD-10-CM | POA: Diagnosis not present

## 2018-07-26 DIAGNOSIS — H26493 Other secondary cataract, bilateral: Secondary | ICD-10-CM | POA: Diagnosis not present

## 2018-07-26 DIAGNOSIS — Z961 Presence of intraocular lens: Secondary | ICD-10-CM | POA: Diagnosis not present

## 2018-07-27 DIAGNOSIS — I5030 Unspecified diastolic (congestive) heart failure: Secondary | ICD-10-CM | POA: Diagnosis not present

## 2018-07-27 DIAGNOSIS — I25810 Atherosclerosis of coronary artery bypass graft(s) without angina pectoris: Secondary | ICD-10-CM | POA: Diagnosis not present

## 2018-07-27 DIAGNOSIS — I4891 Unspecified atrial fibrillation: Secondary | ICD-10-CM | POA: Diagnosis not present

## 2018-07-27 DIAGNOSIS — J449 Chronic obstructive pulmonary disease, unspecified: Secondary | ICD-10-CM | POA: Diagnosis not present

## 2018-07-27 DIAGNOSIS — D469 Myelodysplastic syndrome, unspecified: Secondary | ICD-10-CM | POA: Diagnosis not present

## 2018-07-27 DIAGNOSIS — I10 Essential (primary) hypertension: Secondary | ICD-10-CM | POA: Diagnosis not present

## 2018-07-30 DIAGNOSIS — H04123 Dry eye syndrome of bilateral lacrimal glands: Secondary | ICD-10-CM | POA: Diagnosis not present

## 2018-07-30 DIAGNOSIS — H52203 Unspecified astigmatism, bilateral: Secondary | ICD-10-CM | POA: Diagnosis not present

## 2018-07-30 DIAGNOSIS — H401133 Primary open-angle glaucoma, bilateral, severe stage: Secondary | ICD-10-CM | POA: Diagnosis not present

## 2018-07-30 DIAGNOSIS — H26493 Other secondary cataract, bilateral: Secondary | ICD-10-CM | POA: Diagnosis not present

## 2018-07-31 DIAGNOSIS — D469 Myelodysplastic syndrome, unspecified: Secondary | ICD-10-CM | POA: Diagnosis not present

## 2018-07-31 DIAGNOSIS — J449 Chronic obstructive pulmonary disease, unspecified: Secondary | ICD-10-CM | POA: Diagnosis not present

## 2018-07-31 DIAGNOSIS — I25810 Atherosclerosis of coronary artery bypass graft(s) without angina pectoris: Secondary | ICD-10-CM | POA: Diagnosis not present

## 2018-07-31 DIAGNOSIS — I10 Essential (primary) hypertension: Secondary | ICD-10-CM | POA: Diagnosis not present

## 2018-07-31 DIAGNOSIS — I4891 Unspecified atrial fibrillation: Secondary | ICD-10-CM | POA: Diagnosis not present

## 2018-07-31 DIAGNOSIS — I5030 Unspecified diastolic (congestive) heart failure: Secondary | ICD-10-CM | POA: Diagnosis not present

## 2018-08-02 DIAGNOSIS — Z79899 Other long term (current) drug therapy: Secondary | ICD-10-CM | POA: Diagnosis not present

## 2018-08-02 DIAGNOSIS — D649 Anemia, unspecified: Secondary | ICD-10-CM | POA: Diagnosis not present

## 2018-08-02 LAB — CBC AND DIFFERENTIAL
HCT: 22 — AB (ref 36–46)
HEMOGLOBIN: 7.2 — AB (ref 12.0–16.0)
Neutrophils Absolute: 3
PLATELETS: 423 — AB (ref 150–399)
WBC: 3.7

## 2018-08-03 ENCOUNTER — Non-Acute Institutional Stay (SKILLED_NURSING_FACILITY): Payer: Medicare Other | Admitting: Adult Health

## 2018-08-03 ENCOUNTER — Encounter: Payer: Self-pay | Admitting: Adult Health

## 2018-08-03 DIAGNOSIS — M25512 Pain in left shoulder: Secondary | ICD-10-CM

## 2018-08-03 DIAGNOSIS — D469 Myelodysplastic syndrome, unspecified: Secondary | ICD-10-CM | POA: Diagnosis not present

## 2018-08-03 DIAGNOSIS — M25511 Pain in right shoulder: Secondary | ICD-10-CM

## 2018-08-03 DIAGNOSIS — R5383 Other fatigue: Secondary | ICD-10-CM

## 2018-08-03 NOTE — Progress Notes (Signed)
Location:  [facility] of Service:  SNF (31) Provider:   Cindi Carbon, ANP [place] [phone]   Gayland Curry, DO  Patient Care Team: Gayland Curry, DO as PCP - General (Geriatric Medicine) Brand Males, MD as Consulting Physician (Pulmonary Disease) [name], MD as Consulting Physician (Orthopedic Surgery) Ralene Bathe, MD as Consulting Physician (Ophthalmology) Pyrtle, Lajuan Lines, MD as Consulting Physician (Gastroenterology) Royal Hawthorn, NP as Nurse Practitioner (Nurse Practitioner) Court Joy, MD as Consulting Physician (Internal Medicine)  No emergency contact information on file.  Code Status:  DNR Goals of care: Advanced Directive information Advanced Directives 06/05/2018  Does Patient Have a Medical Advance Directive? Yes  Type of Paramedic of [place];Living will;Out of facility DNR (pink MOST or yellow form)  Does patient want to make changes to medical advance directive? No - Patient declined  Copy of [place] in Chart? Yes  Would patient like information on creating a medical advance directive? -  Pre-existing out of facility DNR order (yellow form or pink MOST form) Yellow form placed in chart (order not valid for inpatient use);Pink MOST form placed in chart (order not valid for inpatient use)     Chief Complaint  Patient presents with  . Acute Visit    f/u anemia and UTI    HPI:  Pt is a 84 y.o. female seen today for an acute visit for f/u regarding a UTI and anemia. She was seen on 10/28 due to joint pain, fatigue, and delusions. She was diagnosed with a resistant UTI growing E.Coli and was treated with 7 days of macrobid (has multiple allergies). Scheduled tylenol was ordered for the joint pain which was mostly in her shoulders and has improved. She has more energy as well but still does not feel "quite like herself". Staff report the  delusions have improved but she still has occasional periods of confusion in the evening.  Lab work for the joint pain was unrevealing other than of note her Hgb had reduced to  6.8 10/28.   Iron level was 31 with normal MCV. Ferrous sulfate was increased to BID and her Hgb on 11/14 was 7.2.  B12 level was >2000. Previously her platelets ran over 600,000 but currently are 423,000.  She is followed by hospice for a hx of MDS and was followed by Dr. Burr Medico. At one point she was on medication for this but she stopped it and quite going out to the doctor as she felt she was ready to die one year ago. She has done well since that time and almost seemed as though she could "graduate" from hospice care until this recent bout of illness.   Past Medical History:  Diagnosis Date  . ALLERGIC RHINITIS   . Anxiety   . Asthma   . Atrial fibrillation ([place])   . Back pain of thoracolumbar region    right  . Bronchiectasis   . Colles' fracture of left radius   . COPD (chronic obstructive pulmonary disease) ([place])   . Coronary artery disease   . Diastolic dysfunction   . Diverticulosis   . Dysrhythmia   . Esophageal stricture   . Hiatal hernia   . History of pneumonia   . Hx of cardiovascular stress test    a. ETT-MV 1/14: Exercised 4:16, no ECG changes, poor ex tol, ant defect c/w soft tissue atten, no ischemia, EF 75%  . Hx of colonic polyp   .  Hyperlipidemia   . Hypertension   . IBS (irritable bowel syndrome)   . Insomnia   . Multiple rib fractures 10/2008   bilateral  . Pleural effusion, left   . Pruritus   . Pulmonary nodule   . Syncope    Past Surgical History:  Procedure Laterality Date  . ANKLE CLOSED REDUCTION Right 12/11/2014   Procedure: CLOSED REDUCTION ANKLE;  Surgeon: Rod Can, MD;  Location: [facility];  Service: Orthopedics;  Laterality: Right;  . APPENDECTOMY    . BLADDER SUSPENSION  2005   A-P  . CHOLECYSTECTOMY    . COLONOSCOPY  04/06/2012   Procedure: COLONOSCOPY;  Surgeon: Jerene Bears, MD;  Location: [facility];  Service: Gastroenterology;  Laterality: N/A;  . EXTERNAL FIXATION LEG Right 12/12/2014   Procedure: ADJUSTMENT OF EXTERNAL FIXATION  RIGHT ANKLE;  Surgeon: Rod Can, MD;  Location: [facility];  Service: Orthopedics;  Laterality: Right;  . EXTERNAL FIXATION LEG Right 12/11/2014   Procedure: POSSIBLE EXTERNAL FIXATION RIGHT ANKLE;  Surgeon: Rod Can, MD;  Location: [facility];  Service: Orthopedics;  Laterality: Right;  . HARDWARE REMOVAL Right 12/25/2014   Procedure: RIGHT ANKLE REMOVE OF DEEP IMPLANT ANE EXTERNAL FIXATOR ;  Surgeon: [name], MD;  Location: [facility];  Service: Orthopedics;  Laterality: Right;  . ORIF ANKLE FRACTURE Right 12/25/2014   Procedure: OPEN REDUCTION INTERNAL FIXATION (ORIF)TRIMALLEOLAR  ANKLE FRACTURE;  Surgeon: [name], MD;  Location: [facility];  Service: Orthopedics;  Laterality: Right;  . ORIF WRIST FRACTURE Left 2010     Dr. Burney Gauze.  Marland Kitchen POLYPECTOMY    . ROTATOR CUFF REPAIR Right 2006  . TONSILLECTOMY    . TOTAL KNEE ARTHROPLASTY Right 08/2010    Allergies  Allergen Reactions  . Albuterol Sulfate Palpitations  . Levaquin [Levofloxacin In D5w] Other (See Comments)    QT prolongation. Should avoid all flouroquinolones.   . Lactose Intolerance (Gi) Other (See Comments)  . Amoxicillin Other (See Comments)    REACTION: unspecified  . Codeine Other (See Comments)    Can take Hydrocodone  . Penicillins Hives, Itching and Rash    Hard lump and rash Has patient had a PCN reaction causing immediate rash, facial/tongue/throat swelling, SOB or lightheadedness with hypotension: Yes Has patient had a PCN reaction causing severe rash involving mucus membranes or skin necrosis: No Has patient had a PCN reaction that required hospitalization unknown Has patient had a PCN reaction occurring within the last 10 years: No If all of the above answers are "NO", then may proceed with Cephalosporin use.   . Sulfa Antibiotics Nausea Only  . Sulfonamide Derivatives Nausea Only    Outpatient Encounter Medications as of 08/03/2018  Medication Sig  . acetaminophen (TYLENOL) 500 MG tablet Take 1,000 mg by mouth 2 (two) times daily. Not to exceed 3000mg  daily  . aspirin EC 81 MG tablet Take 1 tablet (81 mg total) by mouth daily.  . bimatoprost (LUMIGAN) 0.01 % SOLN Place 1 drop into both eyes at bedtime.  . cetirizine (ZYRTEC) 10 MG tablet Take 10 mg by mouth daily.  Marland Kitchen dicyclomine (BENTYL) 10 MG capsule Take 1 capsule (10 mg total) by mouth 3 (three) times daily before meals. IBS  . diltiazem (CARDIZEM CD) 360 MG 24 hr capsule Take 360 mg by mouth daily.  . Emollient (EUCERIN) lotion Apply topically as needed for dry skin.  . furosemide (LASIX) 20 MG tablet Take 20 mg by mouth daily.   Marland Kitchen ipratropium (  ATROVENT) 0.02 % nebulizer solution Take 0.5 mg by nebulization 2 (two) times daily.  . iron polysaccharides (NIFEREX) 150 MG capsule Take 150 mg by mouth 2 (two) times daily.   Marland Kitchen levalbuterol (XOPENEX) 1.25 MG/0.5ML nebulizer solution Take 1.25 mg by nebulization every 6 (six) hours as needed for wheezing or shortness of breath.  . loperamide (IMODIUM) 2 MG capsule Take 2 mg by mouth as needed for diarrhea or loose stools.   . magnesium oxide (MAG-OX) 400 MG tablet Take 400 mg by mouth daily as needed (leg cramps).  . Menthol, Topical Analgesic, (BIOFREEZE) 4 % GEL Apply topically 2 (two) times daily. To right shoulder  . Multiple Vitamin (MULTIVITAMIN WITH MINERALS) TABS tablet Take 1 tablet by mouth daily.  Marland Kitchen oxycodone (OXY-IR) 5 MG capsule Take 5 mg by mouth every 4 (four) hours as needed.  . potassium chloride (K-DUR) 10 MEQ tablet Take 10 mEq by mouth daily.  . ranitidine (ZANTAC) 150 MG tablet Take 150 mg by mouth 2 (two) times daily.  Marland Kitchen senna (SENOKOT) 8.6 MG tablet Take 1 tablet by mouth 2 (two) times daily as needed for constipation.   No facility-administered encounter medications on file  as of 08/03/2018.     Review of Systems  Constitutional: Positive for activity change and fatigue. Negative for appetite change, chills, diaphoresis, fever and unexpected weight change.  HENT: Negative for congestion.   Respiratory: Negative for cough, shortness of breath and wheezing.   Cardiovascular: Negative for chest pain, palpitations and leg swelling.  Gastrointestinal: Negative for abdominal distention, abdominal pain, constipation and diarrhea.  Genitourinary: Negative for difficulty urinating and dysuria.  Musculoskeletal: Positive for arthralgias and gait problem. Negative for back pain, joint swelling and myalgias.  Neurological: Positive for syncope. Negative for dizziness, tremors, seizures, facial asymmetry, speech difficulty, weakness, light-headedness, numbness and headaches.  Psychiatric/Behavioral: Positive for confusion. Negative for agitation and behavioral problems.    Immunization History  Administered Date(s) Administered  . H1N1 09/30/2008  . Influenza Split 05/30/2012, 07/01/2013  . Influenza Whole 06/19/2009, 06/20/2011  . Influenza, High Dose Seasonal PF 07/01/2013  . Influenza,inj,Quad PF,6+ Mos 06/05/2014, 07/10/2018  . Influenza-Unspecified 07/19/2017  . Pneumococcal Conjugate-13 08/20/2013  . Pneumococcal Polysaccharide-23 11/18/1992, 08/25/2010  . Tetanus 08/20/2013  . Zoster 09/19/2004  . Zoster Recombinat (Shingrix) 10/20/2017   Pertinent  Health Maintenance Due  Topic Date Due  . DEXA SCAN  03/15/1993  . INFLUENZA VACCINE  Completed  . PNA vac Low Risk Adult  Completed   Fall Risk  06/06/2014  Falls in the past year? No   Functional Status Survey:    Vitals:   08/03/18 1050  Weight: 122 lb 3.2 oz (55.4 kg)   Body mass index is 20.65 kg/m. Physical Exam  Constitutional: She is oriented to person, place, and time. No distress.  HENT:  Head: Normocephalic and atraumatic.  Cardiovascular: Normal rate.  No murmur heard. irreg irreg,  no edema  Pulmonary/Chest: Effort normal and breath sounds normal. No stridor. No respiratory distress.  Abdominal: Soft. Bowel sounds are normal. She exhibits no distension. There is no tenderness.  Musculoskeletal: She exhibits no tenderness or deformity.  Neurological: She is alert and oriented to person, place, and time.  Skin: Skin is warm and dry. She is not diaphoretic.  Nursing note and vitals reviewed.   Labs reviewed: Recent Labs    01/16/18 0737 07/17/18  NA 137 136*  K 5.2 4.6  BUN 28* 17  CREATININE 0.8 0.7   Recent Labs  01/16/18 0737  AST 31  ALT 9  ALKPHOS 90   Recent Labs    07/13/18 07/16/18 08/02/18  WBC 6.8 10.3 3.7  NEUTROABS 6 8 3   HGB 6.8* 7.1* 7.2*  HCT 20* 22* 22*  PLT 382 431* 423*   Lab Results  Component Value Date   TSH 2.712 03/02/2017   Lab Results  Component Value Date   HGBA1C (H) 10/04/2010    5.7 (NOTE)                                                                       According to the ADA Clinical Practice Recommendations for 2011, when HbA1c is used as a screening test:   >=6.5%   Diagnostic of Diabetes Mellitus           (if abnormal result  is confirmed)  5.7-6.4%   Increased risk of developing Diabetes Mellitus  References:Diagnosis and Classification of Diabetes Mellitus,Diabetes HIDU,3735,78(XBOER 1):S62-S69 and Standards of Medical Care in         Diabetes - 2011,Diabetes Care,2011,34  (Suppl 1):S11-S61.   Lab Results  Component Value Date   CHOL 247 (H) 06/05/2014   HDL 75.60 06/05/2014   LDLCALC 156 (H) 06/05/2014   LDLDIRECT 157.6 01/16/2012   TRIG 78.0 06/05/2014   CHOLHDL 3 06/05/2014    Significant Diagnostic Results in last 30 days:  No results found.  Assessment/Plan  1. MDS/MPN (myelodysplastic/myeloproliferative neoplasms) ([place]) We discussed her options and decided to refer her back to Dr. Burr Medico for additional recommendations due to reduced Hgb, while her WBC and platelets are fairly stable.  She  does not like to go out regularly as she is in skilled care and it is difficult for to travel. Hopefully we can obtain a recommendation and follow her in the facility with any necessary labs.   2. Fatigue, unspecified type Likely related to #1 No s/s of CHF  3. Pain of both shoulder joints Improved with treatment of UTI and tylenol   Family/ staff Communication: discussed with Ms. Surprenant and her husband  Labs/tests ordered:  NA

## 2018-08-06 ENCOUNTER — Telehealth: Payer: Self-pay

## 2018-08-06 ENCOUNTER — Telehealth: Payer: Self-pay | Admitting: Hematology

## 2018-08-06 NOTE — Telephone Encounter (Signed)
I have sent a scheduling message to set up lab and f/u with me next week.   Truitt Merle MD

## 2018-08-06 NOTE — Telephone Encounter (Signed)
Misty with [facility] calls stating that their doctor wants Dr. Burr Medico to see the patient.  Her hemoglobin is running low, 3 days ago it was 7.2

## 2018-08-06 NOTE — Telephone Encounter (Signed)
Scheduled appt per 11/18 sch message - left message for patient with appt date and time.   

## 2018-08-08 ENCOUNTER — Other Ambulatory Visit: Payer: Self-pay | Admitting: Internal Medicine

## 2018-08-08 DIAGNOSIS — M2550 Pain in unspecified joint: Secondary | ICD-10-CM

## 2018-08-08 MED ORDER — OXYCODONE HCL 5 MG PO CAPS: 5.0000 mg | ORAL_CAPSULE | ORAL | 0 refills | Status: DC | PRN

## 2018-08-09 ENCOUNTER — Other Ambulatory Visit: Payer: Self-pay | Admitting: Adult Health

## 2018-08-09 DIAGNOSIS — J449 Chronic obstructive pulmonary disease, unspecified: Secondary | ICD-10-CM | POA: Diagnosis not present

## 2018-08-09 DIAGNOSIS — I10 Essential (primary) hypertension: Secondary | ICD-10-CM | POA: Diagnosis not present

## 2018-08-09 DIAGNOSIS — I25810 Atherosclerosis of coronary artery bypass graft(s) without angina pectoris: Secondary | ICD-10-CM | POA: Diagnosis not present

## 2018-08-09 DIAGNOSIS — D469 Myelodysplastic syndrome, unspecified: Secondary | ICD-10-CM | POA: Diagnosis not present

## 2018-08-09 DIAGNOSIS — I4891 Unspecified atrial fibrillation: Secondary | ICD-10-CM | POA: Diagnosis not present

## 2018-08-09 DIAGNOSIS — I5030 Unspecified diastolic (congestive) heart failure: Secondary | ICD-10-CM | POA: Diagnosis not present

## 2018-08-09 MED ORDER — MORPHINE SULFATE (CONCENTRATE) 20 MG/ML PO SOLN: 5.0000 mg | ORAL | 0 refills | Status: AC | PRN

## 2018-08-10 ENCOUNTER — Other Ambulatory Visit: Payer: Self-pay

## 2018-08-10 ENCOUNTER — Emergency Department (HOSPITAL_COMMUNITY)

## 2018-08-10 ENCOUNTER — Emergency Department (HOSPITAL_COMMUNITY)
Admission: EM | Admit: 2018-08-10 | Discharge: 2018-08-10 | Disposition: A | Discharge status: Home / Self Care | Attending: Emergency Medicine | Admitting: Emergency Medicine

## 2018-08-10 ENCOUNTER — Encounter (HOSPITAL_COMMUNITY): Payer: Self-pay

## 2018-08-10 DIAGNOSIS — I2510 Atherosclerotic heart disease of native coronary artery without angina pectoris: Secondary | ICD-10-CM | POA: Diagnosis not present

## 2018-08-10 DIAGNOSIS — Z79899 Other long term (current) drug therapy: Secondary | ICD-10-CM | POA: Insufficient documentation

## 2018-08-10 DIAGNOSIS — Z7982 Long term (current) use of aspirin: Secondary | ICD-10-CM | POA: Diagnosis not present

## 2018-08-10 DIAGNOSIS — I110 Hypertensive heart disease with heart failure: Secondary | ICD-10-CM | POA: Diagnosis not present

## 2018-08-10 DIAGNOSIS — N390 Urinary tract infection, site not specified: Secondary | ICD-10-CM | POA: Diagnosis not present

## 2018-08-10 DIAGNOSIS — Z743 Need for continuous supervision: Secondary | ICD-10-CM | POA: Diagnosis not present

## 2018-08-10 DIAGNOSIS — R531 Weakness: Secondary | ICD-10-CM | POA: Diagnosis not present

## 2018-08-10 DIAGNOSIS — I5032 Chronic diastolic (congestive) heart failure: Secondary | ICD-10-CM | POA: Diagnosis not present

## 2018-08-10 DIAGNOSIS — F419 Anxiety disorder, unspecified: Secondary | ICD-10-CM | POA: Diagnosis not present

## 2018-08-10 DIAGNOSIS — I4891 Unspecified atrial fibrillation: Secondary | ICD-10-CM | POA: Diagnosis not present

## 2018-08-10 DIAGNOSIS — I25810 Atherosclerosis of coronary artery bypass graft(s) without angina pectoris: Secondary | ICD-10-CM | POA: Diagnosis not present

## 2018-08-10 DIAGNOSIS — J45909 Unspecified asthma, uncomplicated: Secondary | ICD-10-CM | POA: Diagnosis not present

## 2018-08-10 DIAGNOSIS — M542 Cervicalgia: Secondary | ICD-10-CM | POA: Diagnosis not present

## 2018-08-10 DIAGNOSIS — G4489 Other headache syndrome: Secondary | ICD-10-CM | POA: Diagnosis not present

## 2018-08-10 DIAGNOSIS — Z96651 Presence of right artificial knee joint: Secondary | ICD-10-CM | POA: Insufficient documentation

## 2018-08-10 DIAGNOSIS — Z9049 Acquired absence of other specified parts of digestive tract: Secondary | ICD-10-CM | POA: Diagnosis not present

## 2018-08-10 DIAGNOSIS — G9389 Other specified disorders of brain: Secondary | ICD-10-CM | POA: Diagnosis not present

## 2018-08-10 DIAGNOSIS — I5030 Unspecified diastolic (congestive) heart failure: Secondary | ICD-10-CM | POA: Diagnosis not present

## 2018-08-10 DIAGNOSIS — R51 Headache: Secondary | ICD-10-CM | POA: Diagnosis not present

## 2018-08-10 DIAGNOSIS — Z87891 Personal history of nicotine dependence: Secondary | ICD-10-CM | POA: Insufficient documentation

## 2018-08-10 DIAGNOSIS — D469 Myelodysplastic syndrome, unspecified: Secondary | ICD-10-CM | POA: Diagnosis not present

## 2018-08-10 DIAGNOSIS — J449 Chronic obstructive pulmonary disease, unspecified: Secondary | ICD-10-CM | POA: Insufficient documentation

## 2018-08-10 DIAGNOSIS — R404 Transient alteration of awareness: Secondary | ICD-10-CM | POA: Diagnosis not present

## 2018-08-10 DIAGNOSIS — Z7401 Bed confinement status: Secondary | ICD-10-CM | POA: Diagnosis not present

## 2018-08-10 DIAGNOSIS — M2550 Pain in unspecified joint: Secondary | ICD-10-CM | POA: Diagnosis not present

## 2018-08-10 DIAGNOSIS — I10 Essential (primary) hypertension: Secondary | ICD-10-CM | POA: Diagnosis not present

## 2018-08-10 LAB — BASIC METABOLIC PANEL
Anion gap: 8 (ref 5–15)
BUN: 20 mg/dL (ref 8–23)
CHLORIDE: 104 mmol/L (ref 98–111)
CO2: 23 mmol/L (ref 22–32)
CREATININE: 0.82 mg/dL (ref 0.44–1.00)
Calcium: 8.7 mg/dL — ABNORMAL LOW (ref 8.9–10.3)
GFR calc Af Amer: >60 mL/min (ref >60)
GFR calc non Af Amer: >60 mL/min (ref >60)
GLUCOSE: 128 mg/dL — AB (ref 70–99)
POTASSIUM: 4.2 mmol/L (ref 3.5–5.1)
Sodium: 135 mmol/L (ref 135–145)

## 2018-08-10 LAB — CBC WITH DIFFERENTIAL/PLATELET
BASOS ABS: 0 10*3/uL (ref 0.0–0.1)
Band Neutrophils: 1 %
Basophils Relative: 0 %
EOS ABS: 0.1 10*3/uL (ref 0.0–0.5)
EOS PCT: 1 %
HCT: 23.8 % — ABNORMAL LOW (ref 36.0–46.0)
HEMOGLOBIN: 6.8 g/dL — AB (ref 12.0–15.0)
LYMPHS ABS: 1.3 10*3/uL (ref 0.7–4.0)
LYMPHS PCT: 11 %
MCH: 26.5 pg (ref 26.0–34.0)
MCHC: 28.6 g/dL — AB (ref 30.0–36.0)
MCV: 92.6 fL (ref 80.0–100.0)
METAMYELOCYTES PCT: 2 %
MONO ABS: 0.2 10*3/uL (ref 0.1–1.0)
Monocytes Relative: 2 %
NEUTROS PCT: 83 %
NRBC: 0.2 % (ref 0.0–0.2)
Neutro Abs: 10.1 10*3/uL — ABNORMAL HIGH (ref 1.7–7.7)
Platelets: 224 10*3/uL (ref 150–400)
RBC: 2.57 MIL/uL — ABNORMAL LOW (ref 3.87–5.11)
RDW: 15.8 % — ABNORMAL HIGH (ref 11.5–15.5)
WBC: 12 10*3/uL — ABNORMAL HIGH (ref 4.0–10.5)

## 2018-08-10 LAB — URINALYSIS, ROUTINE W REFLEX MICROSCOPIC
Bilirubin Urine: NEGATIVE
Glucose, UA: NEGATIVE mg/dL
Hgb urine dipstick: NEGATIVE
KETONES UR: NEGATIVE mg/dL
Nitrite: POSITIVE — AB
PH: 5 (ref 5.0–8.0)
PROTEIN: NEGATIVE mg/dL
Specific Gravity, Urine: 1.018 (ref 1.005–1.030)

## 2018-08-10 LAB — I-STAT TROPONIN, ED: Troponin i, poc: 0 ng/mL (ref 0.00–0.08)

## 2018-08-10 MED ORDER — FENTANYL CITRATE (PF) 100 MCG/2ML IJ SOLN
25.0000 ug | Freq: Once | INTRAMUSCULAR | Status: AC
  Administered 2018-08-10: 25 ug via INTRAVENOUS
  Filled 2018-08-10: qty 2

## 2018-08-10 MED ORDER — FENTANYL 25 MCG/HR TD PT72: 25.0000 ug | MEDICATED_PATCH | TRANSDERMAL | Status: DC

## 2018-08-10 MED ORDER — NITROFURANTOIN MONOHYD MACRO 100 MG PO CAPS: 100.0000 mg | ORAL_CAPSULE | Freq: Two times a day (BID) | ORAL | 0 refills | Status: AC

## 2018-08-10 NOTE — ED Notes (Signed)
Pt discharged to [facility] through [facility]. Report called

## 2018-08-10 NOTE — Discharge Instructions (Addendum)
Patient was given a fentanyl patch in the ED for pain management.  Please consult with hospice regarding further pain management.  Patient has a UTI.  She will be started on Macrobid.  Return to the ED immediately for new or worsening symptoms, fevers, chest pain, shortness of breath or any concerns at all.

## 2018-08-10 NOTE — ED Triage Notes (Signed)
Pt BIB [facility] for eval of HA x 5 days. Pain w/ turning neck, stiffness. Pt received morphine at SNF this AM w/ no relief. Pt is currently receiving hospice care through [facility]
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
## 2018-08-10 NOTE — ED Notes (Signed)
Patient transported to CT 

## 2018-08-11 DIAGNOSIS — I10 Essential (primary) hypertension: Secondary | ICD-10-CM | POA: Diagnosis not present

## 2018-08-11 DIAGNOSIS — I4891 Unspecified atrial fibrillation: Secondary | ICD-10-CM | POA: Diagnosis not present

## 2018-08-11 DIAGNOSIS — D469 Myelodysplastic syndrome, unspecified: Secondary | ICD-10-CM | POA: Diagnosis not present

## 2018-08-11 DIAGNOSIS — I25810 Atherosclerosis of coronary artery bypass graft(s) without angina pectoris: Secondary | ICD-10-CM | POA: Diagnosis not present

## 2018-08-11 DIAGNOSIS — I5030 Unspecified diastolic (congestive) heart failure: Secondary | ICD-10-CM | POA: Diagnosis not present

## 2018-08-11 DIAGNOSIS — J449 Chronic obstructive pulmonary disease, unspecified: Secondary | ICD-10-CM | POA: Diagnosis not present

## 2018-08-11 LAB — URINE CULTURE: Culture: >=100000 — AB

## 2018-08-13 ENCOUNTER — Telehealth: Payer: Self-pay

## 2018-08-13 DIAGNOSIS — I5030 Unspecified diastolic (congestive) heart failure: Secondary | ICD-10-CM | POA: Diagnosis not present

## 2018-08-13 DIAGNOSIS — J449 Chronic obstructive pulmonary disease, unspecified: Secondary | ICD-10-CM | POA: Diagnosis not present

## 2018-08-13 DIAGNOSIS — D469 Myelodysplastic syndrome, unspecified: Secondary | ICD-10-CM | POA: Diagnosis not present

## 2018-08-13 DIAGNOSIS — I4891 Unspecified atrial fibrillation: Secondary | ICD-10-CM | POA: Diagnosis not present

## 2018-08-13 DIAGNOSIS — I25810 Atherosclerosis of coronary artery bypass graft(s) without angina pectoris: Secondary | ICD-10-CM | POA: Diagnosis not present

## 2018-08-13 DIAGNOSIS — I10 Essential (primary) hypertension: Secondary | ICD-10-CM | POA: Diagnosis not present

## 2018-08-13 NOTE — Telephone Encounter (Signed)
-----   Message from Truitt Merle, MD sent at 08/13/2018  2:37 PM EST ----- Thanks Malachy Mood. Could you put this note in Epic for records? thanks.  Krista Blue  ----- Message ----- From: Jonnie Finner, RN Sent: 08/13/2018   2:20 PM EST To: Truitt Merle, MD  The nurse from [facility] called back had discussion with the family and NP at the facility.  The family does not want her to have a blood transfusion at this point and also need to cancel the appointment for tomorrow.  Scheduling message was sent.  Malachy Mood  ----- Message ----- From: Truitt Merle, MD Sent: 08/12/2018  10:42 PM EST To: Jonnie Finner, RN  Malachy Mood,  She is scheduled to see me on Tuesday. I noticed that she was seen in ED for neck pain and Hb was 6.8. She was discharged without blood transfusion.   Please call pt or her daughter to see if she wants blood transfusion which I recommend. If she is symptomatic and OK to have blood, please see if we can get her in on Monday or Tuesday for type and cross and blood transfusion. Due to her age, will do 1u over 2-3 hours. If she is coming tomorrow, maybe Lacie can see her, if not, I will see her.   Thanks  Krista Blue

## 2018-08-13 NOTE — Telephone Encounter (Signed)
Attempted to call patient at home number, rings, no answer, no voice mail set up, attempted to call on cell phone left voice message per Dr. Burr Medico patient needs blood transfusion, requested return call.   Attempted to call her husband's cell phone no answer, no voice mail.  Called [facility] (in SNF), spoke with [facility] RN taking care of the patient.  Informed her patient's hemoglobin is low and Dr. Burr Medico recommends 1 unit of blood.  She will speak to the family and she if they want her to get this as she is under Hospice care.  Will call us back.

## 2018-08-13 NOTE — Telephone Encounter (Signed)
Nikki RN with [facility] called back stating the family does not want the patient to have a blood transfusion at this point.  The NP is adjusting some of her medications at this point.  They also requested her appointment for tomorrow be cancelled and not reschedule at this time. Dr. Burr Medico is aware.

## 2018-08-14 ENCOUNTER — Inpatient Hospital Stay: Payer: Medicare Other | Admitting: Hematology

## 2018-08-14 ENCOUNTER — Inpatient Hospital Stay: Payer: Medicare Other

## 2018-08-14 DIAGNOSIS — I25810 Atherosclerosis of coronary artery bypass graft(s) without angina pectoris: Secondary | ICD-10-CM | POA: Diagnosis not present

## 2018-08-14 DIAGNOSIS — I5030 Unspecified diastolic (congestive) heart failure: Secondary | ICD-10-CM | POA: Diagnosis not present

## 2018-08-14 DIAGNOSIS — I10 Essential (primary) hypertension: Secondary | ICD-10-CM | POA: Diagnosis not present

## 2018-08-14 DIAGNOSIS — D469 Myelodysplastic syndrome, unspecified: Secondary | ICD-10-CM | POA: Diagnosis not present

## 2018-08-14 DIAGNOSIS — J449 Chronic obstructive pulmonary disease, unspecified: Secondary | ICD-10-CM | POA: Diagnosis not present

## 2018-08-14 DIAGNOSIS — I4891 Unspecified atrial fibrillation: Secondary | ICD-10-CM | POA: Diagnosis not present

## 2018-08-15 DIAGNOSIS — I10 Essential (primary) hypertension: Secondary | ICD-10-CM | POA: Diagnosis not present

## 2018-08-15 DIAGNOSIS — D469 Myelodysplastic syndrome, unspecified: Secondary | ICD-10-CM | POA: Diagnosis not present

## 2018-08-15 DIAGNOSIS — J449 Chronic obstructive pulmonary disease, unspecified: Secondary | ICD-10-CM | POA: Diagnosis not present

## 2018-08-15 DIAGNOSIS — I5030 Unspecified diastolic (congestive) heart failure: Secondary | ICD-10-CM | POA: Diagnosis not present

## 2018-08-15 DIAGNOSIS — I25810 Atherosclerosis of coronary artery bypass graft(s) without angina pectoris: Secondary | ICD-10-CM | POA: Diagnosis not present

## 2018-08-15 DIAGNOSIS — I4891 Unspecified atrial fibrillation: Secondary | ICD-10-CM | POA: Diagnosis not present

## 2018-08-17 DIAGNOSIS — D469 Myelodysplastic syndrome, unspecified: Secondary | ICD-10-CM | POA: Diagnosis not present

## 2018-08-17 DIAGNOSIS — J449 Chronic obstructive pulmonary disease, unspecified: Secondary | ICD-10-CM | POA: Diagnosis not present

## 2018-08-17 DIAGNOSIS — I4891 Unspecified atrial fibrillation: Secondary | ICD-10-CM | POA: Diagnosis not present

## 2018-08-17 DIAGNOSIS — I10 Essential (primary) hypertension: Secondary | ICD-10-CM | POA: Diagnosis not present

## 2018-08-17 DIAGNOSIS — I5030 Unspecified diastolic (congestive) heart failure: Secondary | ICD-10-CM | POA: Diagnosis not present

## 2018-08-17 DIAGNOSIS — I25810 Atherosclerosis of coronary artery bypass graft(s) without angina pectoris: Secondary | ICD-10-CM | POA: Diagnosis not present

## 2018-08-17 IMAGING — CR DG CHEST 2V
2 series · 2 of 2 positions shown · non-contrast
Comparison: 01/04/2017 CT and CXR

CLINICAL DATA: Dyspnea since 6 p.m. with chest pain.

EXAM:
CHEST  2 VIEW

[w chest pa]
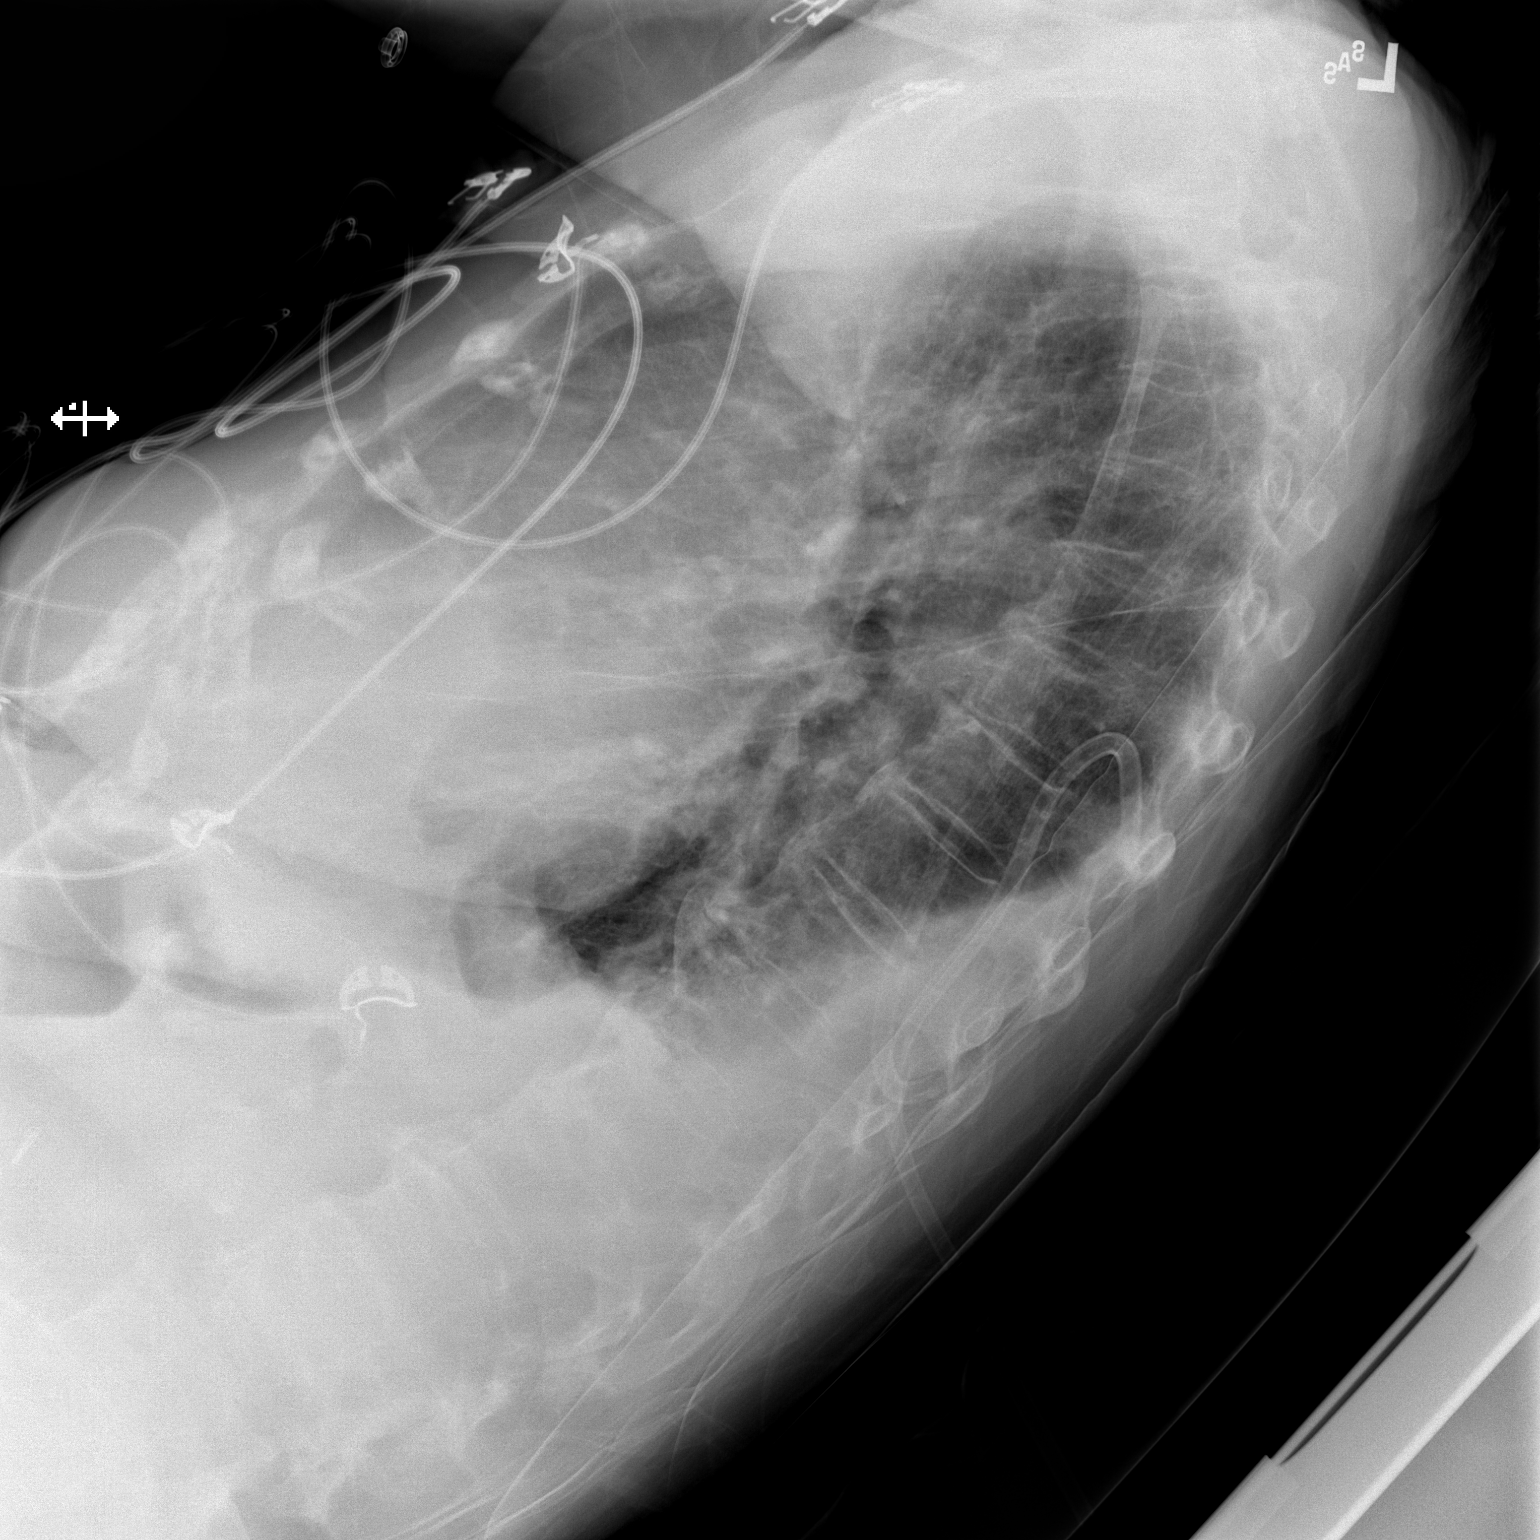

[x chest ap]
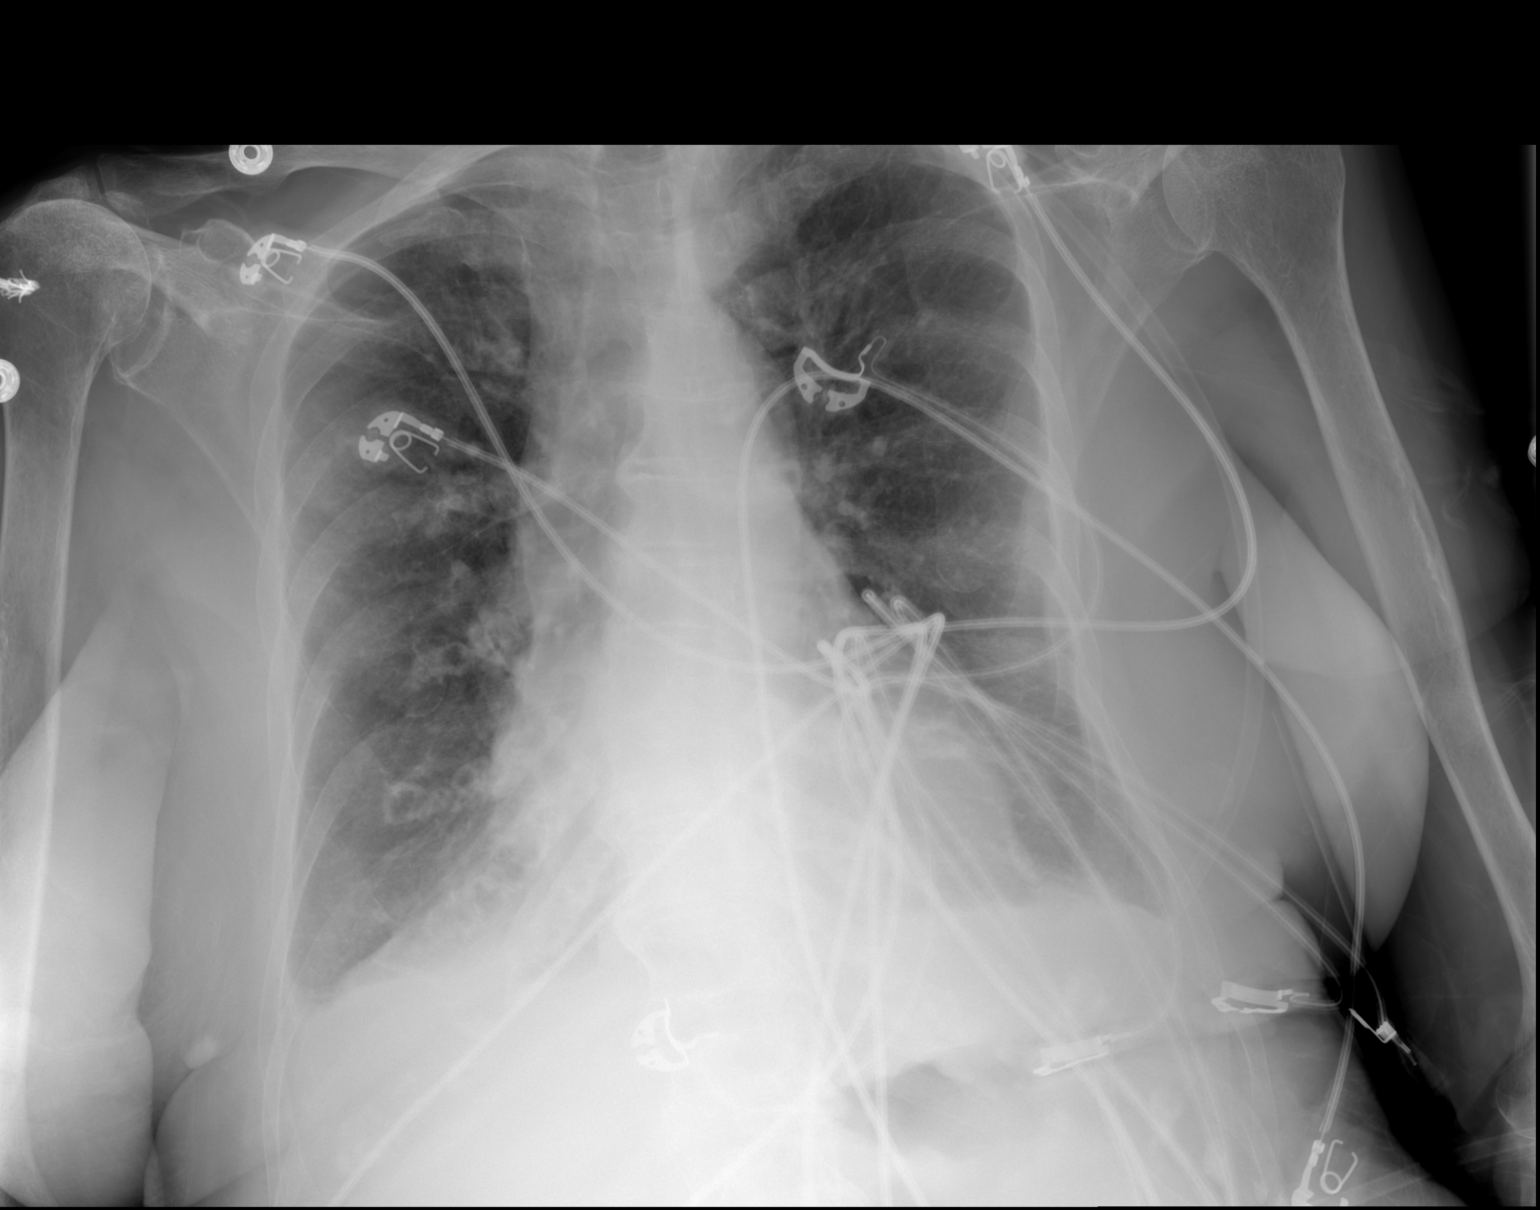

[2 of 2 positions shown; findings below may reference images not displayed]

FINDINGS: The patient is slightly tilted to the right and rotated on current
exam. Heart is borderline enlarged. There is mild aortic
atherosclerosis without aneurysm. Hazy opacities at the lung bases
are in keeping with new layering small to moderate pleural effusions
bilaterally. Hazy appearance of the lungs may also be secondary mild
pulmonary edema. High-riding right humeral head. No acute osseous
abnormality.
IMPRESSION: Findings suggest mild pulmonary edema with new layering small to
moderate posterior pleural effusions. Aortic atherosclerosis.

## 2018-08-20 ENCOUNTER — Encounter: Payer: Self-pay | Admitting: Adult Health

## 2018-08-20 ENCOUNTER — Non-Acute Institutional Stay (SKILLED_NURSING_FACILITY): Payer: Medicare Other | Admitting: Adult Health

## 2018-08-20 DIAGNOSIS — M2550 Pain in unspecified joint: Secondary | ICD-10-CM

## 2018-08-20 DIAGNOSIS — E860 Dehydration: Secondary | ICD-10-CM | POA: Diagnosis not present

## 2018-08-20 DIAGNOSIS — D469 Myelodysplastic syndrome, unspecified: Secondary | ICD-10-CM | POA: Diagnosis not present

## 2018-08-20 DIAGNOSIS — C946 Myelodysplastic disease, not classified: Secondary | ICD-10-CM

## 2018-08-20 DIAGNOSIS — I4821 Permanent atrial fibrillation: Secondary | ICD-10-CM | POA: Diagnosis not present

## 2018-08-20 NOTE — Progress Notes (Signed)
Location:  [facility] of Service:  SNF (31) Provider:   Cindi Carbon, ANP [place] [phone]   Gayland Curry, DO  Patient Care Team: Gayland Curry, DO as PCP - General (Geriatric Medicine) Brand Males, MD as Consulting Physician (Pulmonary Disease) [name], MD as Consulting Physician (Orthopedic Surgery) Ralene Bathe, MD as Consulting Physician (Ophthalmology) Pyrtle, Lajuan Lines, MD as Consulting Physician (Gastroenterology) Royal Hawthorn, NP as Nurse Practitioner (Nurse Practitioner) Court Joy, MD as Consulting Physician (Internal Medicine)  No emergency contact information on file.  Code Status:  DNR Goals of care: Advanced Directive information Advanced Directives 08/10/2018  Does Patient Have a Medical Advance Directive? Yes  Type of Advance Directive Out of facility DNR (pink MOST or yellow form)  Does patient want to make changes to medical advance directive? -  Copy of [place] in Chart? -  Would patient like information on creating a medical advance directive? No - Patient declined  Pre-existing out of facility DNR order (yellow form or pink MOST form) Yellow form placed in chart (order not valid for inpatient use)     Chief Complaint  Patient presents with  . Acute Visit    HR 165, not eating     HPI:  Pt is a 84 y.o. female seen today for an acute visit for a HR of 165 and not eating. She is currently followed by hospice due to MPD diagnosed in 2018.  Her Hgb dropped to 6.8 which was drawn during an ER visit on 11/22 for head ache and neck pain. She was supposed to f/u with oncology, Dr. Burr Medico, and they ordered an outpatient blood transfusion. Her family declined this intervention and canceled her apt with Dr. Burr Medico as she had begun to stop eating, was weak, and more confused. Her CT of the head and neck during the ER visit on 11/22 was fairly unremarkable other than  degenerative changes of the neck. She had been having joint pain prior to the ER visit felt to be due to progressive MPD. Her goals of care are comfort based with no hospitalizations.  The nurse reports that she is eating very little but is making urine. Last BM 11/29.  She is minimally verbal and confused. A large portion of her family came to visit today. I spoke with her husband and they wish for her to be comfortable and die peacefully. The nurse reports this morning she had difficulty swallowing her pills. Her HR has been irregular and in the 160's most of the day.  She has not had any pain and is currently on roxanol 5 mg BID. No prn needed.    Past Medical History:  Diagnosis Date  . ALLERGIC RHINITIS   . Anxiety   . Asthma   . Atrial fibrillation ([place])   . Back pain of thoracolumbar region    right  . Bronchiectasis   . Colles' fracture of left radius   . COPD (chronic obstructive pulmonary disease) ([place])   . Coronary artery disease   . Diastolic dysfunction   . Diverticulosis   . Dysrhythmia   . Esophageal stricture   . Hiatal hernia   . History of pneumonia   . Hx of cardiovascular stress test    a. ETT-MV 1/14: Exercised 4:16, no ECG changes, poor ex tol, ant defect c/w soft tissue atten, no ischemia, EF 75%  . Hx of colonic polyp   . Hyperlipidemia   . Hypertension   .  IBS (irritable bowel syndrome)   . Insomnia   . Multiple rib fractures 10/2008   bilateral  . Pleural effusion, left   . Pruritus   . Pulmonary nodule   . Syncope    Past Surgical History:  Procedure Laterality Date  . ANKLE CLOSED REDUCTION Right 12/11/2014   Procedure: CLOSED REDUCTION ANKLE;  Surgeon: Rod Can, MD;  Location: [facility];  Service: Orthopedics;  Laterality: Right;  . APPENDECTOMY    . BLADDER SUSPENSION  2005   A-P  . CHOLECYSTECTOMY    . COLONOSCOPY  04/06/2012   Procedure: COLONOSCOPY;  Surgeon: Jerene Bears, MD;  Location: [facility];  Service: Gastroenterology;  Laterality:  N/A;  . EXTERNAL FIXATION LEG Right 12/12/2014   Procedure: ADJUSTMENT OF EXTERNAL FIXATION  RIGHT ANKLE;  Surgeon: Rod Can, MD;  Location: [facility];  Service: Orthopedics;  Laterality: Right;  . EXTERNAL FIXATION LEG Right 12/11/2014   Procedure: POSSIBLE EXTERNAL FIXATION RIGHT ANKLE;  Surgeon: Rod Can, MD;  Location: [facility];  Service: Orthopedics;  Laterality: Right;  . HARDWARE REMOVAL Right 12/25/2014   Procedure: RIGHT ANKLE REMOVE OF DEEP IMPLANT ANE EXTERNAL FIXATOR ;  Surgeon: [name], MD;  Location: [facility];  Service: Orthopedics;  Laterality: Right;  . ORIF ANKLE FRACTURE Right 12/25/2014   Procedure: OPEN REDUCTION INTERNAL FIXATION (ORIF)TRIMALLEOLAR  ANKLE FRACTURE;  Surgeon: [name], MD;  Location: [facility];  Service: Orthopedics;  Laterality: Right;  . ORIF WRIST FRACTURE Left 2010     Dr. Burney Gauze.  Marland Kitchen POLYPECTOMY    . ROTATOR CUFF REPAIR Right 2006  . TONSILLECTOMY    . TOTAL KNEE ARTHROPLASTY Right 08/2010    Allergies  Allergen Reactions  . Albuterol Sulfate Palpitations  . Levaquin [Levofloxacin In D5w] Other (See Comments)    QT prolongation. Should avoid all flouroquinolones.   . Lactose Intolerance (Gi) Other (See Comments)  . Amoxicillin Other (See Comments)    REACTION: unspecified  . Codeine Other (See Comments)    Can take Hydrocodone  . Penicillins Hives, Itching and Rash    Hard lump and rash Has patient had a PCN reaction causing immediate rash, facial/tongue/throat swelling, SOB or lightheadedness with hypotension: Yes Has patient had a PCN reaction causing severe rash involving mucus membranes or skin necrosis: No Has patient had a PCN reaction that required hospitalization unknown Has patient had a PCN reaction occurring within the last 10 years: No If all of the above answers are "NO", then may proceed with Cephalosporin use.  . Sulfa Antibiotics Nausea Only  . Sulfonamide Derivatives Nausea Only     Outpatient Encounter Medications as of 08/20/2018  Medication Sig  . morphine (ROXANOL) 20 MG/ML concentrated solution Take 5 mg by mouth 2 (two) times daily.  Marland Kitchen acetaminophen (TYLENOL) 500 MG tablet Take 1,000 mg by mouth 2 (two) times daily. Not to exceed 3000mg  daily  . aspirin EC 81 MG tablet Take 1 tablet (81 mg total) by mouth daily.  . bimatoprost (LUMIGAN) 0.01 % SOLN Place 1 drop into both eyes at bedtime.  . cetirizine (ZYRTEC) 10 MG tablet Take 10 mg by mouth daily.  Marland Kitchen dicyclomine (BENTYL) 10 MG capsule Take 1 capsule (10 mg total) by mouth 3 (three) times daily before meals. IBS  . diltiazem (CARDIZEM CD) 360 MG 24 hr capsule Take 360 mg by mouth daily.  . Emollient (EUCERIN) lotion Apply topically as needed for dry skin.  . furosemide (LASIX) 20 MG tablet Take  20 mg by mouth daily.   Marland Kitchen ipratropium (ATROVENT) 0.02 % nebulizer solution Take 0.5 mg by nebulization 2 (two) times daily.  . iron polysaccharides (NIFEREX) 150 MG capsule Take 150 mg by mouth 2 (two) times daily.   Marland Kitchen levalbuterol (XOPENEX) 1.25 MG/0.5ML nebulizer solution Take 1.25 mg by nebulization every 6 (six) hours as needed for wheezing or shortness of breath.  . loperamide (IMODIUM) 2 MG capsule Take 2 mg by mouth as needed for diarrhea or loose stools.   . magnesium oxide (MAG-OX) 400 MG tablet Take 400 mg by mouth daily as needed (leg cramps).  . Menthol, Topical Analgesic, (BIOFREEZE) 4 % GEL Apply topically 2 (two) times daily. To right shoulder  . morphine (ROXANOL) 20 MG/ML concentrated solution Take 0.25 mLs (5 mg total) by mouth every 4 (four) hours as needed for severe pain.  . Multiple Vitamin (MULTIVITAMIN WITH MINERALS) TABS tablet Take 1 tablet by mouth daily.  . nitrofurantoin, macrocrystal-monohydrate, (MACROBID) 100 MG capsule Take 1 capsule (100 mg total) by mouth 2 (two) times daily.  . potassium chloride (K-DUR) 10 MEQ tablet Take 10 mEq by mouth daily.  . ranitidine (ZANTAC) 150 MG tablet Take  150 mg by mouth 2 (two) times daily.  Marland Kitchen senna (SENOKOT) 8.6 MG tablet Take 1 tablet by mouth 2 (two) times daily as needed for constipation.   No facility-administered encounter medications on file as of 08/20/2018.     Review of Systems  Unable to perform ROS: Acuity of condition    Immunization History  Administered Date(s) Administered  . H1N1 09/30/2008  . Influenza Split 05/30/2012, 07/01/2013  . Influenza Whole 06/19/2009, 06/20/2011  . Influenza, High Dose Seasonal PF 07/01/2013  . Influenza,inj,Quad PF,6+ Mos 06/05/2014, 07/10/2018  . Influenza-Unspecified 07/19/2017  . Pneumococcal Conjugate-13 08/20/2013  . Pneumococcal Polysaccharide-23 11/18/1992, 08/25/2010  . Tetanus 08/20/2013  . Zoster 09/19/2004  . Zoster Recombinat (Shingrix) 10/20/2017   Pertinent  Health Maintenance Due  Topic Date Due  . DEXA SCAN  03/15/1993  . INFLUENZA VACCINE  Completed  . PNA vac Low Risk Adult  Completed   Fall Risk  06/06/2014  Falls in the past year? No   Functional Status Survey:    Vitals:   08/20/18 1512  BP: 136/79  Pulse: (!) 165   There is no height or weight on file to calculate BMI. Physical Exam  Constitutional: No distress.  HENT:  Head: Normocephalic and atraumatic.  Nose: Nose normal.  Mouth/Throat: No oropharyngeal exudate.  Very dry mouth  Eyes: Pupils are equal, round, and reactive to light. Conjunctivae are normal. Right eye exhibits no discharge. Left eye exhibits no discharge.  Cardiovascular:  Irreg, irreg  Rate of 165 No edema  Pulmonary/Chest: Effort normal. She has rales.  Abdominal: Soft. Bowel sounds are normal. She exhibits no distension. There is no tenderness.  Musculoskeletal: She exhibits no edema, tenderness or deformity.  Lymphadenopathy:    She has no cervical adenopathy.  Neurological:  Asleep but arouses to physical stimuli. Not verbal, not able to f/c.  Skin: Skin is warm and dry. She is not diaphoretic. There is pallor.   Excessive bruising noted to BUE  Nursing note and vitals reviewed.   Labs reviewed: Recent Labs    01/16/18 0737 07/17/18 08/10/18 1147  NA 137 136* 135  K 5.2 4.6 4.2  CL  --   --  104  CO2  --   --  23  GLUCOSE  --   --  128*  BUN 28* 17 20  CREATININE 0.8 0.7 0.82  CALCIUM  --   --  8.7*   Recent Labs    01/16/18 0737  AST 31  ALT 9  ALKPHOS 90   Recent Labs    07/16/18 08/02/18 08/10/18 1147  WBC 10.3 3.7 12.0*  NEUTROABS 8 3 10.1*  HGB 7.1* 7.2* 6.8*  HCT 22* 22* 23.8*  MCV  --   --  92.6  PLT 431* 423* 224   Lab Results  Component Value Date   TSH 2.712 03/02/2017   Lab Results  Component Value Date   HGBA1C (H) 10/04/2010    5.7 (NOTE)                                                                       According to the ADA Clinical Practice Recommendations for 2011, when HbA1c is used as a screening test:   >=6.5%   Diagnostic of Diabetes Mellitus           (if abnormal result  is confirmed)  5.7-6.4%   Increased risk of developing Diabetes Mellitus  References:Diagnosis and Classification of Diabetes Mellitus,Diabetes TDVV,6160,73(XTGGY 1):S62-S69 and Standards of Medical Care in         Diabetes - 2011,Diabetes Care,2011,34  (Suppl 1):S11-S61.   Lab Results  Component Value Date   CHOL 247 (H) 06/05/2014   HDL 75.60 06/05/2014   LDLCALC 156 (H) 06/05/2014   LDLDIRECT 157.6 01/16/2012   TRIG 78.0 06/05/2014   CHOLHDL 3 06/05/2014    Significant Diagnostic Results in last 30 days:  Ct Head Wo Contrast  Result Date: 08/10/2018 CLINICAL DATA:  Neck pain. EXAM: CT HEAD WITHOUT CONTRAST CT CERVICAL SPINE WITHOUT CONTRAST TECHNIQUE: Multidetector CT imaging of the head and cervical spine was performed following the standard protocol without intravenous contrast. Multiplanar CT image reconstructions of the cervical spine were also generated. COMPARISON:  CT scan of December 11, 2014. FINDINGS: CT HEAD FINDINGS Brain: Mild chronic ischemic white matter  disease is noted. Mild diffuse cortical atrophy is noted. No mass effect or midline shift is noted. Ventricular size is within normal limits. There is no evidence of mass lesion, hemorrhage or acute infarction. Vascular: No hyperdense vessel or unexpected calcification. Skull: Normal. Negative for fracture or focal lesion. Sinuses/Orbits: Stable chronic changes are seen involving the left maxillary sinus. No acute sinusitis is noted. Orbits are unremarkable. Other: Fluid is now noted in right mastoid air cells. CT CERVICAL SPINE FINDINGS Alignment: Normal. Skull base and vertebrae: No acute fracture. No primary bone lesion or focal pathologic process. Soft tissues and spinal canal: No prevertebral fluid or swelling. No visible canal hematoma. Disc levels: Severe degenerative disc disease is noted at C4-5. Mild degenerative disc disease is noted at C5-6. Anterior osteophyte formation is noted at C4-5 and C5-6. Upper chest: Negative. Other: Mild degenerative changes are seen involving the left-sided posterior facet joints. IMPRESSION: Mild chronic ischemic white matter disease. Mild diffuse cortical atrophy. No acute intracranial abnormality seen. Multilevel degenerative disc disease is noted in the cervical spine. No fracture or significant spondylolisthesis is noted. Electronically Signed   By: Marijo Conception, M.D.   On: 08/10/2018 12:34   Ct Cervical Spine Wo Contrast  Result Date:  08/10/2018 CLINICAL DATA:  Neck pain. EXAM: CT HEAD WITHOUT CONTRAST CT CERVICAL SPINE WITHOUT CONTRAST TECHNIQUE: Multidetector CT imaging of the head and cervical spine was performed following the standard protocol without intravenous contrast. Multiplanar CT image reconstructions of the cervical spine were also generated. COMPARISON:  CT scan of December 11, 2014. FINDINGS: CT HEAD FINDINGS Brain: Mild chronic ischemic white matter disease is noted. Mild diffuse cortical atrophy is noted. No mass effect or midline shift is noted.  Ventricular size is within normal limits. There is no evidence of mass lesion, hemorrhage or acute infarction. Vascular: No hyperdense vessel or unexpected calcification. Skull: Normal. Negative for fracture or focal lesion. Sinuses/Orbits: Stable chronic changes are seen involving the left maxillary sinus. No acute sinusitis is noted. Orbits are unremarkable. Other: Fluid is now noted in right mastoid air cells. CT CERVICAL SPINE FINDINGS Alignment: Normal. Skull base and vertebrae: No acute fracture. No primary bone lesion or focal pathologic process. Soft tissues and spinal canal: No prevertebral fluid or swelling. No visible canal hematoma. Disc levels: Severe degenerative disc disease is noted at C4-5. Mild degenerative disc disease is noted at C5-6. Anterior osteophyte formation is noted at C4-5 and C5-6. Upper chest: Negative. Other: Mild degenerative changes are seen involving the left-sided posterior facet joints. IMPRESSION: Mild chronic ischemic white matter disease. Mild diffuse cortical atrophy. No acute intracranial abnormality seen. Multilevel degenerative disc disease is noted in the cervical spine. No fracture or significant spondylolisthesis is noted. Electronically Signed   By: Marijo Conception, M.D.   On: 08/10/2018 12:34    Assessment/Plan  1. MDS/MPN (myelodysplastic/myeloproliferative neoplasms) ([place]) Progressive with reduced hemoglobin  Worsening weakness and joint pain Continue hospice, comfort care Appears to be transitioning  2. Permanent atrial fibrillation Uncontrolled due to #1 and dehydration Not able to swallow pills, will d/c  3. Dehydration Due to poor intake, mouth care to be provided by staff No IVF or other intervention due to goals of care  4. Arthralgia, unspecified joint Her pain to the neck, head and shoulders is controlled at this time.  Continue Roxanol 5 mg BID and q 4 hrs prn Dulcolax supp PR qd prn  Family/ staff Communication: discussed with her  husband Tom Kincy  Labs/tests ordered:  NA

## 2018-08-24 ENCOUNTER — Other Ambulatory Visit: Payer: Self-pay | Admitting: Adult Health

## 2018-08-24 MED ORDER — LORAZEPAM 0.5 MG PO TABS: 0.2500 mg | ORAL_TABLET | Freq: Two times a day (BID) | ORAL | 0 refills | Status: AC

## 2018-08-24 NOTE — Progress Notes (Signed)
Staff report increased restlessness and yelling, will start ativan 0.25 mg BID scheduled

## 2018-09-19 DEATH — deceased
